# Patient Record
Sex: Female | Born: 1935 | Race: Asian | Hispanic: No | Smoking: Current every day smoker
Health system: Southern US, Community
[De-identification: ages and names within clinical notes are randomized; demographics above are authoritative.]

## PROBLEM LIST (undated history)

## (undated) DIAGNOSIS — I10 Essential (primary) hypertension: Secondary | ICD-10-CM

## (undated) DIAGNOSIS — E119 Type 2 diabetes mellitus without complications: Secondary | ICD-10-CM

## (undated) DIAGNOSIS — E785 Hyperlipidemia, unspecified: Secondary | ICD-10-CM

## (undated) DIAGNOSIS — Z9189 Other specified personal risk factors, not elsewhere classified: Secondary | ICD-10-CM

## (undated) DIAGNOSIS — I779 Disorder of arteries and arterioles, unspecified: Secondary | ICD-10-CM

## (undated) DIAGNOSIS — I7300 Raynaud's syndrome without gangrene: Secondary | ICD-10-CM

## (undated) DIAGNOSIS — Z8541 Personal history of malignant neoplasm of cervix uteri: Secondary | ICD-10-CM

## (undated) DIAGNOSIS — I340 Nonrheumatic mitral (valve) insufficiency: Secondary | ICD-10-CM

## (undated) DIAGNOSIS — G500 Trigeminal neuralgia: Secondary | ICD-10-CM

## (undated) DIAGNOSIS — Z9114 Patient's other noncompliance with medication regimen: Secondary | ICD-10-CM

## (undated) DIAGNOSIS — Z91148 Patient's other noncompliance with medication regimen for other reason: Secondary | ICD-10-CM

## (undated) DIAGNOSIS — I2510 Atherosclerotic heart disease of native coronary artery without angina pectoris: Secondary | ICD-10-CM

## (undated) HISTORY — DX: Hyperlipidemia, unspecified: E78.5

## (undated) HISTORY — DX: Nonrheumatic mitral (valve) insufficiency: I34.0

## (undated) HISTORY — DX: Trigeminal neuralgia: G50.0

## (undated) HISTORY — DX: Patient's other noncompliance with medication regimen for other reason: Z91.148

## (undated) HISTORY — DX: Other specified personal risk factors, not elsewhere classified: Z91.89

## (undated) HISTORY — DX: Personal history of malignant neoplasm of cervix uteri: Z85.41

## (undated) HISTORY — DX: Essential (primary) hypertension: I10

## (undated) HISTORY — DX: Atherosclerotic heart disease of native coronary artery without angina pectoris: I25.10

## (undated) HISTORY — DX: Type 2 diabetes mellitus without complications: E11.9

## (undated) HISTORY — DX: Patient's other noncompliance with medication regimen: Z91.14

## (undated) HISTORY — DX: Disorder of arteries and arterioles, unspecified: I77.9

## (undated) HISTORY — PX: BREAST BIOPSY: SHX20

## (undated) HISTORY — DX: Raynaud's syndrome without gangrene: I73.00

## (undated) HISTORY — PX: BREAST ENHANCEMENT SURGERY: SHX7

---

## 1961-11-25 HISTORY — PX: TOTAL ABDOMINAL HYSTERECTOMY: SHX209

## 1999-11-01 ENCOUNTER — Encounter: Payer: Self-pay | Admitting: Emergency Medicine

## 1999-11-01 ENCOUNTER — Inpatient Hospital Stay (HOSPITAL_COMMUNITY): Admission: EM | Admit: 1999-11-01 | Discharge: 1999-11-06 | Payer: Self-pay

## 1999-11-01 ENCOUNTER — Encounter: Payer: Self-pay | Admitting: General Surgery

## 1999-11-02 ENCOUNTER — Encounter: Payer: Self-pay | Admitting: Neurosurgery

## 1999-11-03 ENCOUNTER — Encounter: Payer: Self-pay | Admitting: General Surgery

## 1999-11-04 ENCOUNTER — Encounter: Payer: Self-pay | Admitting: Internal Medicine

## 1999-11-08 ENCOUNTER — Encounter: Admission: RE | Admit: 1999-11-08 | Discharge: 1999-11-08 | Payer: Self-pay | Admitting: *Deleted

## 2000-05-27 ENCOUNTER — Encounter: Admission: RE | Admit: 2000-05-27 | Discharge: 2000-05-27 | Payer: Self-pay | Admitting: General Surgery

## 2000-05-27 ENCOUNTER — Encounter: Payer: Self-pay | Admitting: General Surgery

## 2000-05-29 ENCOUNTER — Encounter (INDEPENDENT_AMBULATORY_CARE_PROVIDER_SITE_OTHER): Payer: Self-pay | Admitting: *Deleted

## 2000-05-29 ENCOUNTER — Ambulatory Visit (HOSPITAL_BASED_OUTPATIENT_CLINIC_OR_DEPARTMENT_OTHER): Admission: RE | Admit: 2000-05-29 | Discharge: 2000-05-29 | Payer: Self-pay | Admitting: General Surgery

## 2003-11-01 ENCOUNTER — Other Ambulatory Visit: Admission: RE | Admit: 2003-11-01 | Discharge: 2003-11-01 | Payer: Self-pay | Admitting: Obstetrics and Gynecology

## 2005-05-10 ENCOUNTER — Ambulatory Visit: Payer: Self-pay | Admitting: Internal Medicine

## 2005-08-28 ENCOUNTER — Ambulatory Visit: Payer: Self-pay | Admitting: Internal Medicine

## 2006-01-01 ENCOUNTER — Ambulatory Visit: Payer: Self-pay | Admitting: Internal Medicine

## 2006-05-01 ENCOUNTER — Encounter: Payer: Self-pay | Admitting: Internal Medicine

## 2006-05-02 ENCOUNTER — Ambulatory Visit: Payer: Self-pay | Admitting: Internal Medicine

## 2006-05-02 ENCOUNTER — Other Ambulatory Visit: Admission: RE | Admit: 2006-05-02 | Discharge: 2006-05-02 | Payer: Self-pay | Admitting: Internal Medicine

## 2006-05-02 ENCOUNTER — Encounter (INDEPENDENT_AMBULATORY_CARE_PROVIDER_SITE_OTHER): Payer: Self-pay | Admitting: *Deleted

## 2006-07-31 ENCOUNTER — Ambulatory Visit: Payer: Self-pay | Admitting: Internal Medicine

## 2006-10-30 ENCOUNTER — Ambulatory Visit: Payer: Self-pay | Admitting: Internal Medicine

## 2006-10-30 LAB — CONVERTED CEMR LAB: Hgb A1c MFr Bld: 8 % — ABNORMAL HIGH (ref 4.6–6.0)

## 2007-02-02 ENCOUNTER — Ambulatory Visit: Payer: Self-pay | Admitting: Internal Medicine

## 2007-02-02 LAB — CONVERTED CEMR LAB: Hgb A1c MFr Bld: 7.8 % — ABNORMAL HIGH (ref 4.6–6.0)

## 2007-05-05 ENCOUNTER — Encounter: Payer: Self-pay | Admitting: Internal Medicine

## 2007-05-05 ENCOUNTER — Ambulatory Visit: Payer: Self-pay | Admitting: Internal Medicine

## 2007-05-05 LAB — CONVERTED CEMR LAB: Hgb A1c MFr Bld: 7.6 % — ABNORMAL HIGH (ref 4.6–6.0)

## 2007-07-09 ENCOUNTER — Encounter (INDEPENDENT_AMBULATORY_CARE_PROVIDER_SITE_OTHER): Payer: Self-pay

## 2007-07-23 DIAGNOSIS — E785 Hyperlipidemia, unspecified: Secondary | ICD-10-CM

## 2007-07-23 DIAGNOSIS — Z8541 Personal history of malignant neoplasm of cervix uteri: Secondary | ICD-10-CM

## 2007-07-23 DIAGNOSIS — I10 Essential (primary) hypertension: Secondary | ICD-10-CM | POA: Insufficient documentation

## 2007-07-23 DIAGNOSIS — E119 Type 2 diabetes mellitus without complications: Secondary | ICD-10-CM | POA: Insufficient documentation

## 2007-08-20 ENCOUNTER — Ambulatory Visit: Payer: Self-pay | Admitting: Internal Medicine

## 2007-08-20 LAB — CONVERTED CEMR LAB
ALT: 19 units/L (ref 0–35)
AST: 20 units/L (ref 0–37)
Albumin: 3.7 g/dL (ref 3.5–5.2)
Alkaline Phosphatase: 59 units/L (ref 39–117)
BUN: 9 mg/dL (ref 6–23)
Basophils Absolute: 0.1 10*3/uL (ref 0.0–0.1)
Basophils Relative: 0.9 % (ref 0.0–1.0)
Bilirubin, Direct: 0.2 mg/dL (ref 0.0–0.3)
CO2: 28 meq/L (ref 19–32)
Calcium: 9.4 mg/dL (ref 8.4–10.5)
Chloride: 108 meq/L (ref 96–112)
Cholesterol: 151 mg/dL (ref 0–200)
Creatinine, Ser: 0.7 mg/dL (ref 0.4–1.2)
Direct LDL: 80 mg/dL
Eosinophils Absolute: 0.1 10*3/uL (ref 0.0–0.6)
Eosinophils Relative: 0.7 % (ref 0.0–5.0)
GFR calc Af Amer: 106 mL/min
GFR calc non Af Amer: 88 mL/min
Glucose, Bld: 62 mg/dL — ABNORMAL LOW (ref 70–99)
HCT: 44.4 % (ref 36.0–46.0)
HDL: 39.6 mg/dL (ref >39.0)
Hemoglobin: 15.3 g/dL — ABNORMAL HIGH (ref 12.0–15.0)
Hgb A1c MFr Bld: 7 % — ABNORMAL HIGH (ref 4.6–6.0)
Lymphocytes Relative: 26.9 % (ref 12.0–46.0)
MCHC: 34.4 g/dL (ref 30.0–36.0)
MCV: 93.7 fL (ref 78.0–100.0)
Monocytes Absolute: 0.6 10*3/uL (ref 0.2–0.7)
Monocytes Relative: 5 % (ref 3.0–11.0)
Neutro Abs: 7.2 10*3/uL (ref 1.4–7.7)
Neutrophils Relative %: 66.5 % (ref 43.0–77.0)
Platelets: 273 10*3/uL (ref 150–400)
Potassium: 4 meq/L (ref 3.5–5.1)
RBC: 4.74 M/uL (ref 3.87–5.11)
RDW: 12.9 % (ref 11.5–14.6)
Sodium: 144 meq/L (ref 135–145)
TSH: 1.38 microintl units/mL (ref 0.35–5.50)
Total Bilirubin: 0.9 mg/dL (ref 0.3–1.2)
Total CHOL/HDL Ratio: 3.8
Total Protein: 6 g/dL (ref 6.0–8.3)
Triglycerides: 224 mg/dL (ref 0–149)
VLDL: 45 mg/dL — ABNORMAL HIGH (ref 0–40)
WBC: 11 10*3/uL — ABNORMAL HIGH (ref 4.5–10.5)

## 2007-08-24 ENCOUNTER — Encounter: Payer: Self-pay | Admitting: Internal Medicine

## 2007-09-07 ENCOUNTER — Ambulatory Visit: Payer: Self-pay | Admitting: Gastroenterology

## 2007-09-18 ENCOUNTER — Ambulatory Visit: Payer: Self-pay | Admitting: Gastroenterology

## 2007-09-18 ENCOUNTER — Encounter: Payer: Self-pay | Admitting: Internal Medicine

## 2008-03-08 ENCOUNTER — Ambulatory Visit: Payer: Self-pay | Admitting: Internal Medicine

## 2008-04-13 ENCOUNTER — Encounter: Payer: Self-pay | Admitting: Internal Medicine

## 2008-04-14 ENCOUNTER — Telehealth: Payer: Self-pay | Admitting: Internal Medicine

## 2008-04-14 ENCOUNTER — Ambulatory Visit: Payer: Self-pay | Admitting: Internal Medicine

## 2008-04-14 DIAGNOSIS — G500 Trigeminal neuralgia: Secondary | ICD-10-CM | POA: Insufficient documentation

## 2008-04-14 LAB — CONVERTED CEMR LAB
ALT: 17 units/L (ref 0–35)
AST: 19 units/L (ref 0–37)
Albumin: 3.4 g/dL — ABNORMAL LOW (ref 3.5–5.2)
Alkaline Phosphatase: 65 units/L (ref 39–117)
Basophils Absolute: 0.1 10*3/uL (ref 0.0–0.1)
Basophils Relative: 1.2 % — ABNORMAL HIGH (ref 0.0–1.0)
Bilirubin, Direct: 0.1 mg/dL (ref 0.0–0.3)
Eosinophils Absolute: 0.1 10*3/uL (ref 0.0–0.7)
Eosinophils Relative: 1.3 % (ref 0.0–5.0)
HCT: 43.7 % (ref 36.0–46.0)
Hemoglobin: 14.7 g/dL (ref 12.0–15.0)
Lymphocytes Relative: 28.2 % (ref 12.0–46.0)
MCHC: 33.6 g/dL (ref 30.0–36.0)
MCV: 93.3 fL (ref 78.0–100.0)
Monocytes Absolute: 0.4 10*3/uL (ref 0.1–1.0)
Monocytes Relative: 4.6 % (ref 3.0–12.0)
Neutro Abs: 5.6 10*3/uL (ref 1.4–7.7)
Neutrophils Relative %: 64.7 % (ref 43.0–77.0)
Platelets: 272 10*3/uL (ref 150–400)
RBC: 4.68 M/uL (ref 3.87–5.11)
RDW: 12.8 % (ref 11.5–14.6)
Total Bilirubin: 0.7 mg/dL (ref 0.3–1.2)
Total Protein: 5.7 g/dL — ABNORMAL LOW (ref 6.0–8.3)
WBC: 8.7 10*3/uL (ref 4.5–10.5)

## 2008-10-19 ENCOUNTER — Ambulatory Visit: Payer: Self-pay | Admitting: Internal Medicine

## 2009-02-15 ENCOUNTER — Ambulatory Visit: Payer: Self-pay | Admitting: Internal Medicine

## 2009-02-15 DIAGNOSIS — J069 Acute upper respiratory infection, unspecified: Secondary | ICD-10-CM | POA: Insufficient documentation

## 2009-02-15 LAB — CONVERTED CEMR LAB: Hgb A1c MFr Bld: 7 % — ABNORMAL HIGH (ref 4.6–6.5)

## 2009-03-08 ENCOUNTER — Telehealth: Payer: Self-pay | Admitting: Internal Medicine

## 2009-04-06 ENCOUNTER — Encounter: Payer: Self-pay | Admitting: Internal Medicine

## 2009-06-14 ENCOUNTER — Ambulatory Visit: Payer: Self-pay | Admitting: Internal Medicine

## 2009-06-15 LAB — CONVERTED CEMR LAB: Hgb A1c MFr Bld: 7.6 % — ABNORMAL HIGH (ref 4.6–6.5)

## 2009-10-11 ENCOUNTER — Ambulatory Visit: Payer: Self-pay | Admitting: Internal Medicine

## 2009-10-12 LAB — CONVERTED CEMR LAB: Hgb A1c MFr Bld: 7.6 % — ABNORMAL HIGH (ref 4.6–6.5)

## 2009-11-30 ENCOUNTER — Ambulatory Visit: Payer: Self-pay | Admitting: Internal Medicine

## 2009-11-30 DIAGNOSIS — I7300 Raynaud's syndrome without gangrene: Secondary | ICD-10-CM | POA: Insufficient documentation

## 2009-11-30 LAB — CONVERTED CEMR LAB: Hgb A1c MFr Bld: 6.9 % — ABNORMAL HIGH (ref 4.6–6.5)

## 2010-01-12 ENCOUNTER — Ambulatory Visit: Payer: Self-pay | Admitting: Internal Medicine

## 2010-01-12 LAB — CONVERTED CEMR LAB: Blood Glucose, Fingerstick: 96

## 2010-04-05 ENCOUNTER — Telehealth: Payer: Self-pay | Admitting: Internal Medicine

## 2010-04-09 ENCOUNTER — Ambulatory Visit: Payer: Self-pay | Admitting: Internal Medicine

## 2010-07-16 ENCOUNTER — Ambulatory Visit: Payer: Self-pay | Admitting: Internal Medicine

## 2010-07-16 DIAGNOSIS — B079 Viral wart, unspecified: Secondary | ICD-10-CM | POA: Insufficient documentation

## 2010-07-16 LAB — HM DIABETES FOOT EXAM

## 2010-07-16 LAB — HM DIABETES EYE EXAM: HM Diabetic Eye Exam: NORMAL

## 2010-10-08 ENCOUNTER — Ambulatory Visit: Payer: Self-pay | Admitting: Internal Medicine

## 2010-12-27 NOTE — Assessment & Plan Note (Signed)
Summary: 3 MTH ROV // RS   Vital Signs:  Patient profile:   77 year old female Weight:      119 pounds Temp:     98.1 degrees F oral BP sitting:   100 / 60  (left arm) Cuff size:   regular  Vitals Entered By: Duard Brady LPN (Apr 09, 2010 12:47 PM) CC: 3 mos rov - doing ok       bs 147 Is Patient Diabetic? Yes Did you bring your meter with you today? No   CC:  3 mos rov - doing ok       bs 147.  History of Present Illness: 77 year old patient seen today for follow-up of her type 2 diabetes, hypertension, and dyslipidemia.  She continues to do quite well.  Her last hemoglobin A1c6.9.  Blood sugars remain under nice control.  Her only complaint is a nonhealing rash involving her left thumb.  This been there for a number of months.  This has not responded to topical antibiotic ointment.  She remains on Lipitor, which she continues to tolerate well.  She denies any cardiopulmonary complaints  Allergies (verified): No Known Drug Allergies  Past History:  Past Medical History: Reviewed history from 11/30/2009 and no changes required. Diabetes mellitus, type II Hyperlipidemia Hypertension Cervical cancer, hx of trigeminal neuralgia Raynaud's phenomenon  Family History: Reviewed history from 08/20/2007 and no changes required. both parents died  in their 53s natural causes, details unclear 6 brothers 6 sisters denies any family history of diabetes, cancer or premature heart disease  Review of Systems       The patient complains of suspicious skin lesions.  The patient denies anorexia, fever, weight loss, weight gain, vision loss, decreased hearing, hoarseness, chest pain, syncope, dyspnea on exertion, peripheral edema, prolonged cough, headaches, hemoptysis, abdominal pain, melena, hematochezia, severe indigestion/heartburn, hematuria, incontinence, genital sores, muscle weakness, transient blindness, difficulty walking, depression, unusual weight change, abnormal bleeding,  enlarged lymph nodes, angioedema, and breast masses.    Physical Exam  General:  Well-developed,well-nourished,in no acute distress; alert,appropriate and cooperative throughout examination Eyes:  No corneal or conjunctival inflammation noted. EOMI. Perrla. Funduscopic exam benign, without hemorrhages, exudates or papilledema. Vision grossly normal. Mouth:  Oral mucosa and oropharynx without lesions or exudates.  Teeth in good repair. Neck:  No deformities, masses, or tenderness noted. Lungs:  a few crackles, right base Heart:  Normal rate and regular rhythm. S1 and S2 normal without gallop, murmur, click, rub or other extra sounds. Abdomen:  Bowel sounds positive,abdomen soft and non-tender without masses, organomegaly or hernias noted. Msk:  No deformity or scoliosis noted of thoracic or lumbar spine.   Pulses:  R and L carotid,radial,femoral,dorsalis pedis and posterior tibial pulses are full and equal bilaterally Extremities:  No clubbing, cyanosis, edema, or deformity noted with normal full range of motion of all joints.   Skin:  a small 3 to 4-mm chronic appearing shallow ulcer noted involving the dorsal aspect of her left medial thumb Cervical Nodes:  No lymphadenopathy noted   Impression & Recommendations:  Problem # 1:  HYPERTENSION (ICD-401.9)  Her updated medication list for this problem includes:    Lisinopril-hydrochlorothiazide 20-12.5 Mg Tabs (Lisinopril-hydrochlorothiazide) .[name]... 2 every am  Her updated medication list for this problem includes:    Lisinopril-hydrochlorothiazide 20-12.5 Mg Tabs (Lisinopril-hydrochlorothiazide) .[name]... 2 every am  Problem # 2:  HYPERLIPIDEMIA (ICD-272.4)  Her updated medication list for this problem includes:    Lipitor 20 Mg Tabs (Atorvastatin calcium) .Marland KitchenMarland KitchenMarland KitchenMarland Kitchen  1 once daily  Her updated medication list for this problem includes:    Lipitor 20 Mg Tabs (Atorvastatin calcium) .[name]... 1 once daily  Problem # 3:  DIABETES MELLITUS, TYPE II  (ICD-250.00)  Her updated medication list for this problem includes:    Lisinopril-hydrochlorothiazide 20-12.5 Mg Tabs (Lisinopril-hydrochlorothiazide) .[name]... 2 every am    Metformin Hcl 1000 Mg Tabs (Metformin hcl) ..... One tablet at bedtime    Glipizide Xl 5 Mg Xr24h-tab (Glipizide) ..... One daily    Her updated medication list for this problem includes:    Lisinopril-hydrochlorothiazide 20-12.5 Mg Tabs (Lisinopril-hydrochlorothiazide) .[name]... 2 every am    Metformin Hcl 1000 Mg Tabs (Metformin hcl) ..... One tablet at bedtime    Glipizide Xl 5 Mg Xr24h-tab (Glipizide) ..... One daily  Orders: Venipuncture ([address]) TLB-A1C / Hgb A1C (Glycohemoglobin) (83036-A1C)  Complete Medication List: 1)  Onetouch Ultra Test Strp (Glucose blood) .... Use as directed 2)  Lipitor 20 Mg Tabs (Atorvastatin calcium) .[name].. 1 once daily 3)  Lisinopril-hydrochlorothiazide 20-12.5 Mg Tabs (Lisinopril-hydrochlorothiazide) .... 2 every am 4)  Hydrocodone-homatropine 5-1.5 Mg/48ml Syrp (Hydrocodone-homatropine) .[name].. 1 teaspoon every 6 hours as needed for cough 5)  Metformin Hcl 1000 Mg Tabs (Metformin hcl) .... One tablet at bedtime 6)  Bd Microtainer Lancets Misc (Lancets) .... Use as directed 7)  Glipizide Xl 5 Mg Xr24h-tab (Glipizide) .... One daily 8)  Ra Triple Antibiotic Plus 1 % Oint (Neomy-bacit-polymyx-pramoxine) .... Apply twice daily  Patient Instructions: 1)  Please schedule a follow-up appointment in 3 months. 2)  Limit your Sodium (Salt) to less than 2 grams a day(slightly less than 1/2 a teaspoon) to prevent fluid retention, swelling, or worsening of symptoms. 3)  It is important that you exercise regularly at least 20 minutes 5 times a week. If you develop chest pain, have severe difficulty breathing, or feel very tired , stop exercising immediately and seek medical attention. 4)  Check your blood sugars regularly. If your readings are usually above : or below 70 you should contact our  office. 5)  It is important that your Diabetic A1c level is checked every 3 months. 6)  See your eye doctor yearly to check for diabetic eye damage. Prescriptions: RA TRIPLE ANTIBIOTIC PLUS 1 % OINT (NEOMY-BACIT-POLYMYX-PRAMOXINE) apply twice daily  #30 gms x 0   Entered and Authorized by:   Gordy Savers  MD   Signed by:   Gordy Savers  MD on 04/09/2010   Method used:   Print then Give to Patient   RxID:   [id number]

## 2010-12-27 NOTE — Assessment & Plan Note (Signed)
Summary: cpx/ pt will be fasting/mm   Vital Signs:  Patient profile:   77 year old female Height:      57 inches Weight:      120 pounds BMI:     26.06 Temp:     98.8 degrees F oral BP sitting:   106 / 70  (left arm) Cuff size:   regular  Vitals Entered By: Duard Brady LPN (January 12, 2010 9:41 AM) CC: was for CPX - pt does not want , 3mos ROV , no problems ,   Is Patient Diabetic? Yes CBG Result 96   CC:  was for CPX - pt does not want , 3mos ROV , no problems , and .  History of Present Illness: 77 year old patient who is seen today for follow-up of her diabetes and hypertension.  she was scheduled for a physical, which she declines.  She states as she has episodes during the night, where she becomes quite weak, shaky, and diaphoretic.  For the past few nights.  She has discontinued her metformin and has had no recurrences.  She was seen here last month and her hemoglobin A1c was 6.9.  She has been going to the health club regularly.  Last month, her metformin had been on hold for two months.  Preventive Screening-Counseling & Management  Alcohol-Tobacco     Smoking Status: current  Allergies (verified): No Known Drug Allergies  Past History:  Past Medical History: Reviewed history from 11/30/2009 and no changes required. Diabetes mellitus, type II Hyperlipidemia Hypertension Cervical cancer, hx of trigeminal neuralgia Raynaud's phenomenon  Review of Systems  The patient denies anorexia, fever, weight loss, weight gain, vision loss, decreased hearing, hoarseness, chest pain, syncope, dyspnea on exertion, peripheral edema, prolonged cough, headaches, hemoptysis, abdominal pain, melena, hematochezia, severe indigestion/heartburn, hematuria, incontinence, genital sores, muscle weakness, suspicious skin lesions, transient blindness, difficulty walking, depression, unusual weight change, abnormal bleeding, enlarged lymph nodes, angioedema, and breast masses.     Physical Exam  General:  Well-developed,well-nourished,in no acute distress; alert,appropriate and cooperative throughout examination Mouth:  Oral mucosa and oropharynx without lesions or exudates.  Teeth in good repair. Neck:  No deformities, masses, or tenderness noted. Lungs:  Normal respiratory effort, chest expands symmetrically. Lungs are clear to auscultation, no crackles or wheezes. Heart:  Normal rate and regular rhythm. S1 and S2 normal without gallop, murmur, click, rub or other extra sounds. Abdomen:  Bowel sounds positive,abdomen soft and non-tender without masses, organomegaly or hernias noted. Msk:  No deformity or scoliosis noted of thoracic or lumbar spine.     Impression & Recommendations:  Problem # 1:  HYPERTENSION (ICD-401.9)  Her updated medication list for this problem includes:    Lisinopril-hydrochlorothiazide 20-12.5 Mg Tabs (Lisinopril-hydrochlorothiazide) .[name]... 2 every am  Her updated medication list for this problem includes:    Lisinopril-hydrochlorothiazide 20-12.5 Mg Tabs (Lisinopril-hydrochlorothiazide) .[name]... 2 every am  Problem # 2:  DIABETES MELLITUS, TYPE II (ICD-250.00)  The following medications were removed from the medication list:    Glipizide 10 Mg Tb24 (Glipizide) .[name]... 1 every morning    Actos 15 Mg Tabs (Pioglitazone hcl) .[name]... 1 once daily Her updated medication list for this problem includes:    Lisinopril-hydrochlorothiazide 20-12.5 Mg Tabs (Lisinopril-hydrochlorothiazide) .[name]... 2 every am    Metformin Hcl 1000 Mg Tabs (Metformin hcl) ..... One tablet at bedtime    Glipizide Xl 5 Mg Xr24h-tab (Glipizide) ..... One daily  Orders: Capillary Blood Glucose/CBG [phone])  The following medications were removed from the  medication list:    Glipizide 10 Mg Tb24 (Glipizide) .[name]... 1 every morning    Actos 15 Mg Tabs (Pioglitazone hcl) .[name]... 1 once daily Her updated medication list for this problem includes:     Lisinopril-hydrochlorothiazide 20-12.5 Mg Tabs (Lisinopril-hydrochlorothiazide) .[name]... 2 every am    Metformin Hcl 1000 Mg Tabs (Metformin hcl) ..... One tablet at bedtime    Glipizide Xl 5 Mg Xr24h-tab (Glipizide) ..... One daily  Complete Medication List: 1)  Onetouch Ultra Test Strp (Glucose blood) .... Use as directed 2)  Lipitor 20 Mg Tabs (Atorvastatin calcium) .[name].. 1 once daily 3)  Lisinopril-hydrochlorothiazide 20-12.5 Mg Tabs (Lisinopril-hydrochlorothiazide) .... 2 every am 4)  Hydrocodone-homatropine 5-1.5 Mg/58ml Syrp (Hydrocodone-homatropine) .[name].. 1 teaspoon every 6 hours as needed for cough 5)  Metformin Hcl 1000 Mg Tabs (Metformin hcl) .... One tablet at bedtime 6)  Bd Microtainer Lancets Misc (Lancets) .... Use as directed 7)  Glipizide Xl 5 Mg Xr24h-tab (Glipizide) .... One daily  Other Orders: Prescription Created Electronically [id number])  Patient Instructions: 1)  Please schedule a follow-up appointment in 3 months. 2)  Limit your Sodium (Salt). 3)  It is important that you exercise regularly at least 20 minutes 5 times a week. If you develop chest pain, have severe difficulty breathing, or feel very tired , stop exercising immediately and seek medical attention. 4)  Take calcium +Vitamin D daily. 5)  Check your blood sugars regularly. If your readings are usually above : or below 70 you should contact our office. 6)  It is important that your Diabetic A1c level is checked every 3 months. 7)  See your eye doctor yearly to check for diabetic eye damage. Prescriptions: GLIPIZIDE XL 5 MG XR24H-TAB (GLIPIZIDE) one daily  #90 x 6   Entered and Authorized by:   Gordy Savers  MD   Signed by:   Gordy Savers  MD on 01/12/2010   Method used:   Print then Give to Patient   RxID:   [id number] BD MICROTAINER LANCETS  MISC (LANCETS) use as directed  #90 x 6   Entered and Authorized by:   Gordy Savers  MD   Signed by:   Gordy Savers  MD on 01/12/2010    Method used:   Print then Give to Patient   RxID:   [id number] METFORMIN HCL 1000 MG TABS (METFORMIN HCL) one tablet at bedtime  #90 x 6   Entered and Authorized by:   Gordy Savers  MD   Signed by:   Gordy Savers  MD on 01/12/2010   Method used:   Print then Give to Patient   RxID:   [id number] LISINOPRIL-HYDROCHLOROTHIAZIDE 20-12.5 MG  TABS (LISINOPRIL-HYDROCHLOROTHIAZIDE) 2 every am  #100.0 Each x 2   Entered and Authorized by:   Gordy Savers  MD   Signed by:   Gordy Savers  MD on 01/12/2010   Method used:   Print then Give to Patient   RxID:   [id number] LIPITOR 20 MG  TABS (ATORVASTATIN CALCIUM) 1 once daily  #90.0 Each x 2   Entered and Authorized by:   Gordy Savers  MD   Signed by:   Gordy Savers  MD on 01/12/2010   Method used:   Print then Give to Patient   RxID:   [id number] ONETOUCH ULTRA TEST  STRP (GLUCOSE BLOOD) Use as directed  #100 x 6   Entered and Authorized by:   Gordy Savers  MD   Signed by:   Gordy Savers  MD on 01/12/2010   Method used:   Print then Give to Patient   RxID:   [id number] GLIPIZIDE XL 5 MG XR24H-TAB (GLIPIZIDE) one daily  #90 x 6   Entered and Authorized by:   Gordy Savers  MD   Signed by:   Gordy Savers  MD on 01/12/2010   Method used:   Electronically to        [facility] #[id number]* (retail)       [address] [place], [place]  [address]       Ph: [phone]       Fax: [phone]   RxID:   [id number] BD MICROTAINER LANCETS  MISC (LANCETS) use as directed  #90 x 6   Entered and Authorized by:   Gordy Savers  MD   Signed by:   Gordy Savers  MD on 01/12/2010   Method used:   Electronically to        [facility] #[id number]* (retail)       [address] [place], [place]  [address]       Ph: [phone]       Fax: [phone]   RxID:   [id number] METFORMIN HCL 1000 MG TABS (METFORMIN HCL)  one tablet at bedtime  #90 x 6   Entered and Authorized by:   Gordy Savers  MD   Signed by:   Gordy Savers  MD on 01/12/2010   Method used:   Electronically to        [facility] #[id number]* (retail)       [address] [place], [place]  [address]       Ph: [phone]       Fax: [phone]   RxID:   [id number] LISINOPRIL-HYDROCHLOROTHIAZIDE 20-12.5 MG  TABS (LISINOPRIL-HYDROCHLOROTHIAZIDE) 2 every am  #100.0 Each x 2   Entered and Authorized by:   Gordy Savers  MD   Signed by:   Gordy Savers  MD on 01/12/2010   Method used:   Electronically to        [facility] #[id number]* (retail)       [address] [place], [place]  [address]       Ph: [phone]       Fax: [phone]   RxID:   [id number] LIPITOR 20 MG  TABS (ATORVASTATIN CALCIUM) 1 once daily  #90.0 Each x 2   Entered and Authorized by:   Gordy Savers  MD   Signed by:   Gordy Savers  MD on 01/12/2010   Method used:   Electronically to        [facility] #[id number]* (retail)       [address] [place], [place]  [address]       Ph: [phone]       Fax: [phone]   RxID:   [id number] [place] TEST  STRP (GLUCOSE BLOOD) Use as directed  #100 x 6   Entered and Authorized by:   Gordy Savers  MD   Signed by:   Gordy Savers  MD on 01/12/2010   Method used:   Electronically to  [facility] #[id number]* (retail)       [address] [place], [place]  [address]       Ph: [phone]       Fax: [phone]   RxID:   [id number]

## 2010-12-27 NOTE — Assessment & Plan Note (Signed)
Summary: stays hungry, and hands look discolored/dm   Vital Signs:  Patient profile:   77 year old female Weight:      116 pounds BMI:     25.19 Temp:     98.4 degrees F oral BP sitting:   130 / 64  (left arm) Cuff size:   regular  Vitals Entered By: Raechel Ache, RN (November 30, 2009 3:22 PM) CC: C/o fingers turning black and numb in cold and feels too hungry Is Patient Diabetic? Yes   CC:  C/o fingers turning black and numb in cold and feels too hungry.  History of Present Illness: 77 year old patient who is seen today for follow-up of her hypertension and diabetes.  She has a long history of discoloration of primarily her index and third fingers on exposure to cold.  For the past two weeks.  She states that both her hands upon cold exposure become dark and cold.  There is no significant pain, but the hands do become numb. Other complaints include inability to lose weight and increase in her appetite and food consumption.  She does go to the jam frequently.  She has been on metformin in the past, but she states she ran out two months ago.  Her last hemoglobin A1c7.6.  Allergies: No Known Drug Allergies  Past History:  Past Medical History: Diabetes mellitus, type II Hyperlipidemia Hypertension Cervical cancer, hx of trigeminal neuralgia Raynaud's phenomenon  Past Surgical History: Reviewed history from 08/20/2007 and no changes required. Breast biopsy Hysterectomy breast implants hysterectomy 1963, for cervical cancer in situ  Review of Systems       The patient complains of weight gain.  The patient denies anorexia, fever, weight loss, vision loss, decreased hearing, hoarseness, chest pain, syncope, dyspnea on exertion, peripheral edema, prolonged cough, headaches, hemoptysis, abdominal pain, melena, hematochezia, severe indigestion/heartburn, hematuria, incontinence, genital sores, muscle weakness, suspicious skin lesions, transient blindness, difficulty walking,  depression, unusual weight change, abnormal bleeding, enlarged lymph nodes, angioedema, breast masses, and testicular masses.    Physical Exam  General:  overweight-appearing.  blood pressure, normaloverweight-appearing.   Head:  Normocephalic and atraumatic without obvious abnormalities. No apparent alopecia or balding. Eyes:  No corneal or conjunctival inflammation noted. EOMI. Perrla. Funduscopic exam benign, without hemorrhages, exudates or papilledema. Vision grossly normal. Mouth:  Oral mucosa and oropharynx without lesions or exudates.  Teeth in good repair. Neck:  No deformities, masses, or tenderness noted. Lungs:  Normal respiratory effort, chest expands symmetrically. Lungs are clear to auscultation, no crackles or wheezes. Heart:  Normal rate and regular rhythm. S1 and S2 normal without gallop, murmur, click, rub or other extra sounds. Abdomen:  Bowel sounds positive,abdomen soft and non-tender without masses, organomegaly or hernias noted. Pulses:  the right dorsalis pedis pulse diminished Extremities:  no obvious ischemia of the fingers.  Radial and ulnar pulses normal Skin:  Intact without suspicious lesions or rashes Cervical Nodes:  No lymphadenopathy noted   Impression & Recommendations:  Problem # 1:  HYPERTENSION (ICD-401.9)  Her updated medication list for this problem includes:    Lisinopril-hydrochlorothiazide 20-12.5 Mg Tabs (Lisinopril-hydrochlorothiazide) .[name]... 2 every am  Her updated medication list for this problem includes:    Lisinopril-hydrochlorothiazide 20-12.5 Mg Tabs (Lisinopril-hydrochlorothiazide) .[name]... 2 every am  Problem # 2:  HYPERLIPIDEMIA (ICD-272.4)  Her updated medication list for this problem includes:    Lipitor 20 Mg Tabs (Atorvastatin calcium) .[name]... 1 once daily  Her updated medication list for this problem includes:  Lipitor 20 Mg Tabs (Atorvastatin calcium) .[name]... 1 once daily  Problem # 3:  DIABETES MELLITUS, TYPE II  (ICD-250.00)  Her updated medication list for this problem includes:    Glipizide 10 Mg Tb24 (Glipizide) .[name]... 1 every morning    Actos 15 Mg Tabs (Pioglitazone hcl) .[name]... 1 once daily    Lisinopril-hydrochlorothiazide 20-12.5 Mg Tabs (Lisinopril-hydrochlorothiazide) .[name]... 2 every am    Metformin Hcl 1000 Mg Tabs (Metformin hcl) ..... One tablet at bedtime    Her updated medication list for this problem includes:    Glipizide 10 Mg Tb24 (Glipizide) .[name]... 1 every morning    Actos 15 Mg Tabs (Pioglitazone hcl) .[name]... 1 once daily    Lisinopril-hydrochlorothiazide 20-12.5 Mg Tabs (Lisinopril-hydrochlorothiazide) .[name]... 2 every am    Metformin Hcl 1000 Mg Tabs (Metformin hcl) ..... One tablet at bedtime  Problem # 4:  RAYNAUD'S SYNDROME (ICD-443.0)  symptoms seem very mild self-limiting and not associated with pain or overt ischemia.  Will simply treat with the cold avoidance and the use of gloves when exposed to cold.  If symptoms worsen or fail to improve, will consider a dihydropyridine calcium channel blocker  Complete Medication List: 1)  Glipizide 10 Mg Tb24 (Glipizide) .[name].. 1 every morning 2)  Onetouch Ultra Test Strp (Glucose blood) .... Use as directed 3)  Lipitor 20 Mg Tabs (Atorvastatin calcium) .[name].. 1 once daily 4)  Actos 15 Mg Tabs (Pioglitazone hcl) .[name].. 1 once daily 5)  Lisinopril-hydrochlorothiazide 20-12.5 Mg Tabs (Lisinopril-hydrochlorothiazide) .... 2 every am 6)  Hydrocodone-homatropine 5-1.5 Mg/27ml Syrp (Hydrocodone-homatropine) .[name].. 1 teaspoon every 6 hours as needed for cough 7)  Metformin Hcl 1000 Mg Tabs (Metformin hcl) .... One tablet at bedtime  Other Orders: Venipuncture (56213) TLB-A1C / Hgb A1C (Glycohemoglobin) (83036-A1C) Prescription Created Electronically [id number])  Patient Instructions: 1)  Please schedule a follow-up appointment in 3 months. 2)  Limit your Sodium (Salt). 3)  It is important that you exercise regularly at least 20 minutes 5 times a week.  If you develop chest pain, have severe difficulty breathing, or feel very tired , stop exercising immediately and seek medical attention. 4)  Check your blood sugars regularly. If your readings are usually above : or below 70 you should contact our office. 5)  It is important that your Diabetic A1c level is checked every 3 months. 6)  See your eye doctor yearly to check for diabetic eye damage. 7)  Wear gloves or mittens on exposure to cold Prescriptions: METFORMIN HCL 1000 MG TABS (METFORMIN HCL) one tablet at bedtime  #90 x 6   Entered and Authorized by:   Gordy Savers  MD   Signed by:   Gordy Savers  MD on 11/30/2009   Method used:   Print then Give to Patient   RxID:   [id number] METFORMIN HCL 1000 MG TABS (METFORMIN HCL) one tablet at bedtime  #90 x 6   Entered and Authorized by:   Gordy Savers  MD   Signed by:   Gordy Savers  MD on 11/30/2009   Method used:   Electronically to        [id number]* (retail)       [address] [place], [place]  [address]       Ph: [phone]       Fax: [phone]   RxID:   [id number]

## 2010-12-27 NOTE — Progress Notes (Signed)
Summary: no call no show 3 mos rov  no call no show 3 mos rov - attempt to call - ans mach - LMTCB and r/s   KIK

## 2010-12-27 NOTE — Assessment & Plan Note (Signed)
Summary: 3 month rov/njr   Vital Signs:  Patient profile:   77 year old female Weight:      117 pounds BP sitting:   140 / 78  (left arm) Cuff size:   regular  Vitals Entered By: Kathrynn Speed CMA (July 16, 2010 10:42 AM)  CC: 3 mth rov, src, Hypertension Management Is Patient Diabetic? Yes CBG Result 144   CC:  3 mth rov, src, and Hypertension Management.  History of Present Illness: 77 year old patient seen today for follow-up.  She has hypertension, diabetes, and dyslipidemia.  Her last hemoglobin A1c was 6.9.  Her weight has been stable and has decreased 2 pounds.  She feels well today.  She has tolerated her statin therapy without difficulty.  Hypertension History:      She denies headache, chest pain, palpitations, dyspnea with exertion, orthopnea, PND, peripheral edema, visual symptoms, neurologic problems, syncope, and side effects from treatment.        Positive major cardiovascular risk factors include female age 37 years old or older, diabetes, hyperlipidemia, hypertension, and current tobacco user.     Current Medications (verified): 1)  Onetouch Ultra Test  Strp (Glucose Blood) .... Use As Directed 2)  Lipitor 20 Mg  Tabs (Atorvastatin Calcium) .[name].. 1 Once Daily 3)  Lisinopril-Hydrochlorothiazide 20-12.5 Mg  Tabs (Lisinopril-Hydrochlorothiazide) .... 2 Every Am 4)  Hydrocodone-Homatropine 5-1.5 Mg/69ml Syrp (Hydrocodone-Homatropine) .[name].. 1 Teaspoon Every 6 Hours As Needed For Cough 5)  Metformin Hcl 1000 Mg Tabs (Metformin Hcl) .... One Tablet At Bedtime 6)  Bd Microtainer Lancets  Misc (Lancets) .... Use As Directed 7)  Glipizide Xl 5 Mg Xr24h-Tab (Glipizide) .... One Daily 8)  Ra Triple Antibiotic Plus 1 % Oint (Neomy-Bacit-Polymyx-Pramoxine) .... Apply Twice Daily  Allergies (verified): No Known Drug Allergies  Past History:  Past Medical History: Reviewed history from 11/30/2009 and no changes required. Diabetes mellitus, type  II Hyperlipidemia Hypertension Cervical cancer, hx of trigeminal neuralgia Raynaud's phenomenon  Family History: Reviewed history from 08/20/2007 and no changes required. both parents died  in their 42s natural causes, details unclear 6 brothers 6 sisters denies any family history of diabetes, cancer or premature heart disease  Review of Systems       The patient complains of suspicious skin lesions.  The patient denies anorexia, fever, weight loss, weight gain, vision loss, decreased hearing, hoarseness, chest pain, syncope, dyspnea on exertion, peripheral edema, prolonged cough, headaches, hemoptysis, abdominal pain, melena, hematochezia, severe indigestion/heartburn, hematuria, incontinence, genital sores, muscle weakness, transient blindness, difficulty walking, depression, unusual weight change, abnormal bleeding, enlarged lymph nodes, angioedema, and breast masses.    Physical Exam  General:  Well-developed,well-nourished,in no acute distress; alert,appropriate and cooperative throughout examination Head:  Normocephalic and atraumatic without obvious abnormalities. No apparent alopecia or balding. Eyes:  No corneal or conjunctival inflammation noted. EOMI. Perrla. Funduscopic exam benign, without hemorrhages, exudates or papilledema. Vision grossly normal. Mouth:  Oral mucosa and oropharynx without lesions or exudates.  Teeth in good repair. Neck:  No deformities, masses, or tenderness noted. Lungs:  Normal respiratory effort, chest expands symmetrically. Lungs are clear to auscultation, no crackles or wheezes. Heart:  Normal rate and regular rhythm. S1 and S2 normal without gallop, murmur, click, rub or other extra sounds. Abdomen:  Bowel sounds positive,abdomen soft and non-tender without masses, organomegaly or hernias noted. Msk:  No deformity or scoliosis noted of thoracic or lumbar spine.   Pulses:  R and L carotid,radial,femoral,dorsalis pedis and posterior tibial pulses are  full and  equal bilaterally Extremities:  No clubbing, cyanosis, edema, or deformity noted with normal full range of motion of all joints.   Skin:  a 3 to 4-mm  papular lesion noted involving the left thumb consistent with a verruca Cervical Nodes:  No lymphadenopathy noted  Diabetes Management Exam:    Foot Exam (with socks and/or shoes not present):       Sensory-Pinprick/Light touch:          Left medial foot (L-4): normal          Left dorsal foot (L-5): normal          Left lateral foot (S-1): normal          Right medial foot (L-4): normal          Right dorsal foot (L-5): normal          Right lateral foot (S-1): normal       Sensory-Monofilament:          Left foot: normal          Right foot: normal       Inspection:          Right foot: normal       Nails:          Left foot: normal          Right foot: normal    Foot Exam by Podiatrist:       Date: 07/16/2010       Results: no diabetic findings       Done by: PCP    Eye Exam:       Eye Exam done here today          Results: normal   Impression & Recommendations:  Problem # 1:  HYPERTENSION (ICD-401.9)  Her updated medication list for this problem includes:    Lisinopril-hydrochlorothiazide 20-12.5 Mg Tabs (Lisinopril-hydrochlorothiazide) .[name]... 2 every am  Her updated medication list for this problem includes:    Lisinopril-hydrochlorothiazide 20-12.5 Mg Tabs (Lisinopril-hydrochlorothiazide) .[name]... 2 every am  Problem # 2:  HYPERLIPIDEMIA (ICD-272.4)  Her updated medication list for this problem includes:    Lipitor 20 Mg Tabs (Atorvastatin calcium) .[name]... 1 once daily  Her updated medication list for this problem includes:    Lipitor 20 Mg Tabs (Atorvastatin calcium) .[name]... 1 once daily  Problem # 3:  DIABETES MELLITUS, TYPE II (ICD-250.00)  Her updated medication list for this problem includes:    Lisinopril-hydrochlorothiazide 20-12.5 Mg Tabs (Lisinopril-hydrochlorothiazide) .[name]... 2 every am    Metformin  Hcl 1000 Mg Tabs (Metformin hcl) ..... One tablet at bedtime    Glipizide Xl 5 Mg Xr24h-tab (Glipizide) ..... One daily  Orders: Capillary Blood Glucose/CBG (47829)  Her updated medication list for this problem includes:    Lisinopril-hydrochlorothiazide 20-12.5 Mg Tabs (Lisinopril-hydrochlorothiazide) .[name]... 2 every am    Metformin Hcl 1000 Mg Tabs (Metformin hcl) ..... One tablet at bedtime    Glipizide Xl 5 Mg Xr24h-tab (Glipizide) ..... One daily  Problem # 4:  WARTS, HAND (ICD-078.10)  Complete Medication List: 1)  Onetouch Ultra Test Strp (Glucose blood) .... Use as directed 2)  Lipitor 20 Mg Tabs (Atorvastatin calcium) .[name].. 1 once daily 3)  Lisinopril-hydrochlorothiazide 20-12.5 Mg Tabs (Lisinopril-hydrochlorothiazide) .... 2 every am 4)  Hydrocodone-homatropine 5-1.5 Mg/20ml Syrp (Hydrocodone-homatropine) .[name].. 1 teaspoon every 6 hours as needed for cough 5)  Metformin Hcl 1000 Mg Tabs (Metformin hcl) .... One tablet at bedtime 6)  Bd Microtainer Lancets Misc (Lancets) .... Use  as directed 7)  Glipizide Xl 5 Mg Xr24h-tab (Glipizide) .... One daily 8)  Ra Triple Antibiotic Plus 1 % Oint (Neomy-bacit-polymyx-pramoxine) .... Apply twice daily  Other Orders: Cryotherapy/Destruction benign or premalignant lesion (1st lesion)  (17000) Venipuncture (04540) TLB-A1C / Hgb A1C (Glycohemoglobin) (83036-A1C)  Hypertension Assessment/Plan:      The patient's hypertensive risk group is category C: Target organ damage and/or diabetes.  Today's blood pressure is 140/78.    Patient Instructions: 1)  Please schedule a follow-up appointment in 3 months. 2)  It is important that you exercise regularly at least 20 minutes 5 times a week. If you develop chest pain, have severe difficulty breathing, or feel very tired , stop exercising immediately and seek medical attention. 3)  Check your blood sugars regularly. If your readings are usually above : or below 70 you should contact our office. 4)  It  is important that your Diabetic A1c level is checked every 3 months. 5)  See your eye doctor yearly to check for diabetic eye damage. Prescriptions: GLIPIZIDE XL 5 MG XR24H-TAB (GLIPIZIDE) one daily  #90 x 6   Entered and Authorized by:   Gordy Savers  MD   Signed by:   Gordy Savers  MD on 07/16/2010   Method used:   Electronically to        [facility] #[id number]* (retail)       [address] [place], [place]  [address]       Ph: [phone]       Fax: [phone]   RxID:   [id number] BD MICROTAINER LANCETS  MISC (LANCETS) use as directed  #90 x 6   Entered and Authorized by:   Gordy Savers  MD   Signed by:   Gordy Savers  MD on 07/16/2010   Method used:   Electronically to        [facility] #[id number]* (retail)       [address] [place], [place]  [address]       Ph: [phone]       Fax: [phone]   RxID:   [id number] METFORMIN HCL 1000 MG TABS (METFORMIN HCL) one tablet at bedtime  #90 x 6   Entered and Authorized by:   Gordy Savers  MD   Signed by:   Gordy Savers  MD on 07/16/2010   Method used:   Electronically to        [facility] #[id number]* (retail)       [address] [place], [place]  [address]       Ph: [phone]       Fax: [phone]   RxID:   [id number] LISINOPRIL-HYDROCHLOROTHIAZIDE 20-12.5 MG  TABS (LISINOPRIL-HYDROCHLOROTHIAZIDE) 2 every am  #100.0 Each x 2   Entered and Authorized by:   Gordy Savers  MD   Signed by:   Gordy Savers  MD on 07/16/2010   Method used:   Electronically to        [facility] #[id number]* (retail)       [address] [place], [place]  [address]       Ph: [phone]       Fax: [phone]   RxID:   [id number] LIPITOR 20 MG  TABS (ATORVASTATIN CALCIUM) 1 once daily  #  90.0 Each x 2   Entered and Authorized by:   Gordy Savers  MD   Signed by:   Gordy Savers  MD on 07/16/2010   Method  used:   Electronically to        [facility] #[id number]* (retail)       [address] [place], [place]  [address]       Ph: [phone]       Fax: [phone]   RxID:   [id number] [place] TEST  STRP (GLUCOSE BLOOD) Use as directed  #100 x 6   Entered and Authorized by:   Gordy Savers  MD   Signed by:   Gordy Savers  MD on 07/16/2010   Method used:   Electronically to        [facility] #[id number]* (retail)       [address] [place], [place]  [address]       Ph: [phone]       Fax: [phone]   RxID:   [id number]   Appended Document: 3 month rov/njr Procedure Note:  Cryotherapy  The patient was noted to have a wart involving her left thumb.  This had become irritated and cosmetically unacceptable.  The area was cleaned with an  alcohol swab and treated with cryotherapy.  She tolerated procedure well.  A Band-Aid was placed.  Expected skin changes, and subsequent skin care discussed

## 2010-12-27 NOTE — Assessment & Plan Note (Signed)
Summary: 3  MONTH ROV/NJR   Vital Signs:  Patient profile:   77 year old female Weight:      117 pounds Temp:     98.2 degrees F oral BP sitting:   122 / 70  (right arm) Cuff size:   regular  Vitals Entered By: Duard Brady LPN (October 08, 2010 10:30 AM)  CC: 3 mos rov - doing well    fbs 185 Is Patient Diabetic? Yes Did you bring your meter with you today? No   CC:  3 mos rov - doing well    fbs 185.  History of Present Illness: 77 year old patient who is seen today for follow-up.  She has a history of type 2 diabetes, on glipizide, as well as metformin at bedtime.  She has self discontinued the latter drug although has been on it for some years.  She remains on Lipitor for dyslipidemia, as well as lisinopril for blood pressure control. She is complaining of a recurrent wart involving the dorsal surface of her left index finger.   Flu Vaccine Consent Questions     Do you have a history of severe allergic reactions to this vaccine? no    Any prior history of allergic reactions to egg and/or gelatin? no    Do you have a sensitivity to the preservative Thimersol? no    Do you have a past history of Guillan-Barre Syndrome? no    Do you currently have an acute febrile illness? no    Have you ever had a severe reaction to latex? no    Vaccine information given and explained to patient? yes    Are you currently pregnant? no    Lot Number:[id number]   Exp Date:05/25/2011   Site Given  Left Deltoid IM   Allergies (verified): No Known Drug Allergies  Past History:  Past Medical History: Reviewed history from 11/30/2009 and no changes required. Diabetes mellitus, type II Hyperlipidemia Hypertension Cervical cancer, hx of trigeminal neuralgia Raynaud's phenomenon  Past Surgical History: Reviewed history from 08/20/2007 and no changes required. Breast biopsy Hysterectomy breast implants hysterectomy 1963, for cervical cancer in situ  Family History: Reviewed  history from 08/20/2007 and no changes required. both parents died  in their 99s natural causes, details unclear 6 brothers 6 sisters denies any family history of diabetes, cancer or premature heart disease  Review of Systems       The patient complains of suspicious skin lesions.  The patient denies anorexia, fever, weight loss, weight gain, vision loss, decreased hearing, hoarseness, chest pain, syncope, dyspnea on exertion, peripheral edema, prolonged cough, headaches, hemoptysis, abdominal pain, melena, hematochezia, severe indigestion/heartburn, hematuria, incontinence, genital sores, muscle weakness, transient blindness, difficulty walking, depression, unusual weight change, abnormal bleeding, enlarged lymph nodes, angioedema, and breast masses.    Physical Exam  General:  Well-developed,well-nourished,in no acute distress; alert,appropriate and cooperative throughout examination Head:  Normocephalic and atraumatic without obvious abnormalities. No apparent alopecia or balding. Eyes:  No corneal or conjunctival inflammation noted. EOMI. Perrla. Funduscopic exam benign, without hemorrhages, exudates or papilledema. Vision grossly normal. Mouth:  Oral mucosa and oropharynx without lesions or exudates.  Teeth in good repair. Neck:  No deformities, masses, or tenderness noted. Lungs:  Normal respiratory effort, chest expands symmetrically. Lungs are clear to auscultation, no crackles or wheezes. Heart:  Normal rate and regular rhythm. S1 and S2 normal without gallop, murmur, click, rub or other extra sounds. Abdomen:  Bowel sounds positive,abdomen soft and non-tender without masses, organomegaly or hernias  noted. Msk:  No deformity or scoliosis noted of thoracic or lumbar spine.   Pulses:  the left the posterior tibial, and the right dorsalis pedis pulse diminished Skin:  a 5 mm verrucal  lesion was present involving the distal left dorsal index finger Cervical Nodes:  No lymphadenopathy  noted   Impression & Recommendations:  Problem # 1:  WARTS, HAND (ICD-078.10)  Orders: Cryotherapy/Destruction benign or premalignant lesion (1st lesion)  (17000)  Problem # 2:  HYPERTENSION (ICD-401.9)  Her updated medication list for this problem includes:    Lisinopril-hydrochlorothiazide 20-12.5 Mg Tabs (Lisinopril-hydrochlorothiazide) .[name]... 2 every am  Her updated medication list for this problem includes:    Lisinopril-hydrochlorothiazide 20-12.5 Mg Tabs (Lisinopril-hydrochlorothiazide) .[name]... 2 every am  Problem # 3:  DIABETES MELLITUS, TYPE II (ICD-250.00)  Her updated medication list for this problem includes:    Lisinopril-hydrochlorothiazide 20-12.5 Mg Tabs (Lisinopril-hydrochlorothiazide) .[name]... 2 every am    Metformin Hcl 1000 Mg Tabs (Metformin hcl) ..... One tablet at bedtime    Glipizide Xl 5 Mg Xr24h-tab (Glipizide) ..... One daily    Her updated medication list for this problem includes:    Lisinopril-hydrochlorothiazide 20-12.5 Mg Tabs (Lisinopril-hydrochlorothiazide) .[name]... 2 every am    Metformin Hcl 1000 Mg Tabs (Metformin hcl) ..... One tablet at bedtime    Glipizide Xl 5 Mg Xr24h-tab (Glipizide) ..... One daily  Orders: Venipuncture (16109) TLB-A1C / Hgb A1C (Glycohemoglobin) (83036-A1C)  Complete Medication List: 1)  Onetouch Ultra Test Strp (Glucose blood) .... Use as directed 2)  Lipitor 20 Mg Tabs (Atorvastatin calcium) .[name].. 1 once daily 3)  Lisinopril-hydrochlorothiazide 20-12.5 Mg Tabs (Lisinopril-hydrochlorothiazide) .... 2 every am 4)  Hydrocodone-homatropine 5-1.5 Mg/33ml Syrp (Hydrocodone-homatropine) .[name].. 1 teaspoon every 6 hours as needed for cough 5)  Metformin Hcl 1000 Mg Tabs (Metformin hcl) .... One tablet at bedtime 6)  Bd Microtainer Lancets Misc (Lancets) .... Use as directed 7)  Glipizide Xl 5 Mg Xr24h-tab (Glipizide) .... One daily 8)  Ra Triple Antibiotic Plus 1 % Oint (Neomy-bacit-polymyx-pramoxine) .... Apply twice daily  Other  Orders: Flu Vaccine 31yrs + MEDICARE PATIENTS (U0454) Administration Flu vaccine - MCR (U9811)  Patient Instructions: 1)  Please schedule a follow-up appointment in 3 months. 2)  Limit your Sodium (Salt). 3)  It is important that you exercise regularly at least 20 minutes 5 times a week. If you develop chest pain, have severe difficulty breathing, or feel very tired , stop exercising immediately and seek medical attention. 4)  Take calcium +Vitamin D daily. 5)  Check your blood sugars regularly. If your readings are usually above : or below 70 you should contact our office. 6)  It is important that your Diabetic A1c level is checked every 3 months. 7)  See your eye doctor yearly to check for diabetic eye damage. Prescriptions: GLIPIZIDE XL 5 MG XR24H-TAB (GLIPIZIDE) one daily  #90 x 6   Entered and Authorized by:   Gordy Savers  MD   Signed by:   Gordy Savers  MD on 10/08/2010   Method used:   Electronically to        [facility] #[id number]* (retail)       [address] [place], [place]  [address]       Ph: [phone]       Fax: [phone]   RxID:   [id number] BD MICROTAINER LANCETS  MISC (LANCETS) use as directed  #90 x 6   Entered and  Authorized by:   Gordy Savers  MD   Signed by:   Gordy Savers  MD on 10/08/2010   Method used:   Electronically to        [facility] #[id number]* (retail)       [address] [place], [place]  [address]       Ph: [phone]       Fax: [phone]   RxID:   [id number] METFORMIN HCL 1000 MG TABS (METFORMIN HCL) one tablet at bedtime  #90 x 6   Entered and Authorized by:   Gordy Savers  MD   Signed by:   Gordy Savers  MD on 10/08/2010   Method used:   Electronically to        [facility] #[id number]* (retail)       [address] [place], [place]  [address]       Ph: [phone]       Fax: [phone]   RxID:    [id number] LISINOPRIL-HYDROCHLOROTHIAZIDE 20-12.5 MG  TABS (LISINOPRIL-HYDROCHLOROTHIAZIDE) 2 every am  #100.0 Each x 2   Entered and Authorized by:   Gordy Savers  MD   Signed by:   Gordy Savers  MD on 10/08/2010   Method used:   Electronically to        [facility] #[id number]* (retail)       [address] [place], [place]  [address]       Ph: [phone]       Fax: [phone]   RxID:   [id number] LIPITOR 20 MG  TABS (ATORVASTATIN CALCIUM) 1 once daily  #90.0 Each x 2   Entered and Authorized by:   Gordy Savers  MD   Signed by:   Gordy Savers  MD on 10/08/2010   Method used:   Electronically to        [facility] #[id number]* (retail)       [address] [place], [place]  [address]       Ph: [phone]       Fax: [phone]   RxID:   [id number] [place] TEST  STRP (GLUCOSE BLOOD) Use as directed  #100 x 6   Entered and Authorized by:   Gordy Savers  MD   Signed by:   Gordy Savers  MD on 10/08/2010   Method used:   Electronically to        [facility] #[id number]* (retail)       [address] [place], [place]  [address]       Ph: [phone]       Fax: [phone]   RxID:   [id number]    Orders Added: 1)  Flu Vaccine 18yrs + MEDICARE PATIENTS [Q2039] 2)  Administration Flu vaccine - MCR [G0008] 3)  Est. Patient Level III [24268] 4)  Cryotherapy/Destruction benign or premalignant lesion (1st lesion)  [17000] 5)  Venipuncture [36415] 6)  TLB-A1C / Hgb A1C (Glycohemoglobin) [83036-A1C]

## 2011-01-04 ENCOUNTER — Encounter: Payer: Self-pay | Admitting: Internal Medicine

## 2011-01-07 ENCOUNTER — Ambulatory Visit (INDEPENDENT_AMBULATORY_CARE_PROVIDER_SITE_OTHER): Payer: Medicare Other | Admitting: Internal Medicine

## 2011-01-07 ENCOUNTER — Encounter: Payer: Self-pay | Admitting: Internal Medicine

## 2011-01-07 DIAGNOSIS — I10 Essential (primary) hypertension: Secondary | ICD-10-CM

## 2011-01-07 DIAGNOSIS — E119 Type 2 diabetes mellitus without complications: Secondary | ICD-10-CM

## 2011-01-07 DIAGNOSIS — I7300 Raynaud's syndrome without gangrene: Secondary | ICD-10-CM

## 2011-01-07 DIAGNOSIS — E785 Hyperlipidemia, unspecified: Secondary | ICD-10-CM

## 2011-01-07 MED ORDER — METFORMIN HCL 1000 MG PO TABS: 1000.0000 mg | ORAL_TABLET | Freq: Two times a day (BID) | ORAL | Status: DC

## 2011-01-07 NOTE — Patient Instructions (Signed)
Limit your sodium (Salt) intake   Please check your hemoglobin A1c every 3 months    It is important that you exercise regularly, at least 20 minutes 3 to 4 times per week.  If you develop chest pain or shortness of breath seek  medical attention.  Return in 3 months for follow-up  

## 2011-01-07 NOTE — Progress Notes (Signed)
  Subjective:    Patient ID: [name], female    DOB: 30-Mar-1934, 77 y.o.   MRN: [id number]  HPI  77 -year-old patient who is seen today for follow-up.she has a history of type 2 diabetes.  Her glycemic control has been stable.  Her last hemoglobin A1c7.2 per Chest her hypertension, which has been well-controlled.  She has a history  Of Raynaud's  which has been stable through the winter.  She has dyslipidemia, well controlled on atorvastatin 20 mg daily. She denies any cardiopulmonary complaints. Review of Systems  Constitutional: Negative.   HENT: Negative for hearing loss, congestion, sore throat, rhinorrhea, dental problem, sinus pressure and tinnitus.   Eyes: Negative for pain, discharge and visual disturbance.  Respiratory: Negative for cough and shortness of breath.   Cardiovascular: Negative for chest pain, palpitations and leg swelling.  Gastrointestinal: Negative for nausea, vomiting, abdominal pain, diarrhea, constipation, blood in stool and abdominal distention.  Genitourinary: Negative for dysuria, urgency, frequency, hematuria, flank pain, vaginal bleeding, vaginal discharge, difficulty urinating, vaginal pain and pelvic pain.  Musculoskeletal: Negative for joint swelling, arthralgias and gait problem.  Skin: Negative for rash.  Neurological: Negative for dizziness, syncope, speech difficulty, weakness, numbness and headaches.  Hematological: Negative for adenopathy.  Psychiatric/Behavioral: Negative for behavioral problems, dysphoric mood and agitation. The patient is not nervous/anxious.        Objective:   Physical Exam  Constitutional: She is oriented to person, place, and time. She appears well-developed and well-nourished.  HENT:  Head: Normocephalic.  Right Ear: External ear normal.  Left Ear: External ear normal.  Mouth/Throat: Oropharynx is clear and moist.  Eyes: Conjunctivae and EOM are normal. Pupils are equal, round, and reactive to light.    Neck: Normal range of motion. Neck supple. No thyromegaly present.  Cardiovascular: Normal rate, regular rhythm, normal heart sounds and intact distal pulses.   Pulmonary/Chest: Effort normal and breath sounds normal.  Abdominal: Soft. Bowel sounds are normal. She exhibits no mass. There is no tenderness.  Musculoskeletal: Normal range of motion.  Lymphadenopathy:    She has no cervical adenopathy.  Neurological: She is alert and oriented to person, place, and time.  Skin: Skin is warm and dry. No rash noted.  Psychiatric: She has a normal mood and affect. Her behavior is normal.          Assessment & Plan:  Diabetes mellitus two-  One check a hemoglobin A1C.  Have encouraged the patient to take metformin b.i.d. Hypertension stable.  Will continue present regimen Raynaud's- stable Dyslipidemia, well controlled on atorvastatin 20 mg dail;  will do a lipid panel at the time of her physical next visit

## 2011-04-09 ENCOUNTER — Encounter: Payer: Self-pay | Admitting: Internal Medicine

## 2011-04-09 ENCOUNTER — Ambulatory Visit (INDEPENDENT_AMBULATORY_CARE_PROVIDER_SITE_OTHER): Payer: Medicare Other | Admitting: Internal Medicine

## 2011-04-09 VITALS — BP 110/76 | HR 70 | Temp 97.8°F | Resp 16 | Ht <= 58 in | Wt 110.0 lb

## 2011-04-09 DIAGNOSIS — E119 Type 2 diabetes mellitus without complications: Secondary | ICD-10-CM

## 2011-04-09 DIAGNOSIS — Z0000 Encounter for general adult medical examination without abnormal findings: Secondary | ICD-10-CM

## 2011-04-09 DIAGNOSIS — E785 Hyperlipidemia, unspecified: Secondary | ICD-10-CM

## 2011-04-09 DIAGNOSIS — I10 Essential (primary) hypertension: Secondary | ICD-10-CM

## 2011-04-09 LAB — LIPID PANEL
Cholesterol: 138 mg/dL (ref 0–200)
HDL: 40.9 mg/dL (ref >39.00)
LDL Cholesterol: 67 mg/dL (ref 0–99)
Total CHOL/HDL Ratio: 3
Triglycerides: 153 mg/dL — ABNORMAL HIGH (ref 0.0–149.0)
VLDL: 30.6 mg/dL (ref 0.0–40.0)

## 2011-04-09 LAB — CBC WITH DIFFERENTIAL/PLATELET
Basophils Relative: 0.3 % (ref 0.0–3.0)
Eosinophils Relative: 0.5 % (ref 0.0–5.0)
HCT: 40.8 % (ref 36.0–46.0)
Lymphs Abs: 2.8 10*3/uL (ref 0.7–4.0)
MCV: 94 fl (ref 78.0–100.0)
Monocytes Absolute: 0.4 10*3/uL (ref 0.1–1.0)
Platelets: 239 10*3/uL (ref 150.0–400.0)
WBC: 10.4 10*3/uL (ref 4.5–10.5)

## 2011-04-09 LAB — HEPATIC FUNCTION PANEL
ALT: 16 U/L (ref 0–35)
Total Protein: 6 g/dL (ref 6.0–8.3)

## 2011-04-09 LAB — BASIC METABOLIC PANEL
BUN: 20 mg/dL (ref 6–23)
Chloride: 104 mEq/L (ref 96–112)
Potassium: 3.9 mEq/L (ref 3.5–5.1)

## 2011-04-09 LAB — TSH: TSH: 0.94 u[IU]/mL (ref 0.35–5.50)

## 2011-04-09 MED ORDER — GLUCOSE BLOOD VI STRP: ORAL_STRIP | Status: DC

## 2011-04-09 MED ORDER — ATORVASTATIN CALCIUM 20 MG PO TABS: 20.0000 mg | ORAL_TABLET | Freq: Every day | ORAL | Status: DC

## 2011-04-09 MED ORDER — GLIPIZIDE ER 5 MG PO TB24: 5.0000 mg | ORAL_TABLET | Freq: Every day | ORAL | Status: DC

## 2011-04-09 MED ORDER — METFORMIN HCL 1000 MG PO TABS: 500.0000 mg | ORAL_TABLET | Freq: Every day | ORAL | Status: DC

## 2011-04-09 MED ORDER — LISINOPRIL 20 MG PO TABS: 20.0000 mg | ORAL_TABLET | Freq: Every day | ORAL | Status: DC

## 2011-04-09 NOTE — Progress Notes (Signed)
Subjective:    Patient ID: [name], female    DOB: 10-09-1934, 77 y.o.   MRN: [id number]  HPI  77 year old patient who is seen today for a health maintenance exam. She has a history of type 2 diabetes and she has down titrated her metformin due to mild hypoglycemia. She also describes frequent episodes of dizziness especially with bending and stooping over. Blood pressure regimen includes combination ACE inhibition and diuretic therapy. She denies any cardiopulmonary complaints unfortunately she continues to smoke  1. Risk factors, based on past  M,S,F history- cardiovascular risk factors include hypertension dyslipidemia diabetes and ongoing tobacco use  2.  Physical activities: Remains quite active goes to the health club frequently  3.  Depression/mood: No history depression or mood disorder  4.  Hearing: No deficits  5.  ADL's: Independent in all aspects of daily living 6.  Fall risk: Low  7.  Home safety: No problems identified  8.  Height weight, and visual acuity; height and weight stable no change in visual acuity  9.  Counseling: Heart healthy diet restricted salt diet also encouraged regular exercise recommended  10. Lab orders based on risk factors: Laboratory profile including lipid panel will be reviewed  11. Referral : None appropriate at this time  12. Care plan: Heart healthy diet regular exercise encouraged  13. Cognitive assessment: Alert and appropriate normal affect no cognitive dysfunction  Past Medical History  Diagnosis Date  . Diabetes mellitus, type 2   . Hyperlipidemia   . Hypertension   . History of cervical cancer   . TN (trigeminal neuralgia)   . Raynaud's phenomenon    Past Surgical History  Procedure Date  . Breast biopsy   . Breast enhancement surgery   . Total abdominal hysterectomy 1963    for cervical cancer in situ    reports that she has been smoking.  She does not have any smokeless tobacco history on file. Her  alcohol and drug histories not on file. family history is not on file. No Known Allergies    Alcohol-Tobacco  Smoking Status: current  Allergies (verified):  No Known Drug Allergies  Past History:  Past Medical History:  Reviewed history from 11/30/2009 and no changes required.  Diabetes mellitus, type II  Hyperlipidemia  Hypertension  Cervical cancer, hx of  trigeminal neuralgia  Raynaud's phenomenon    Family History:  both parents died in their 85s natural causes, details unclear  6 brothers 6 sisters - ONLY ONE DECEASED AT 31 denies any family history of diabetes, cancer or premature heart disease     Review of Systems  Constitutional: Negative for fever, appetite change, fatigue and unexpected weight change.  HENT: Negative for hearing loss, ear pain, nosebleeds, congestion, sore throat, mouth sores, trouble swallowing, neck stiffness, dental problem, voice change, sinus pressure and tinnitus.   Eyes: Negative for photophobia, pain, redness and visual disturbance.  Respiratory: Negative for cough, chest tightness and shortness of breath.   Cardiovascular: Negative for chest pain, palpitations and leg swelling.  Gastrointestinal: Negative for nausea, vomiting, abdominal pain, diarrhea, constipation, blood in stool, abdominal distention and rectal pain.  Genitourinary: Negative for dysuria, urgency, frequency, hematuria, flank pain, vaginal bleeding, vaginal discharge, difficulty urinating, genital sores, vaginal pain, menstrual problem and pelvic pain.  Musculoskeletal: Negative for back pain and arthralgias.  Skin: Negative for rash.  Neurological: Positive for light-headedness. Negative for dizziness, syncope, speech difficulty, weakness, numbness and headaches.  Hematological: Negative for adenopathy. Does not bruise/bleed easily.  Psychiatric/Behavioral: Negative for suicidal ideas, behavioral problems, self-injury, dysphoric mood and agitation. The patient is not  nervous/anxious.        Objective:   Physical Exam  Constitutional: She is oriented to person, place, and time. She appears well-developed and well-nourished.  HENT:  Head: Normocephalic and atraumatic.  Right Ear: External ear normal.  Left Ear: External ear normal.  Mouth/Throat: Oropharynx is clear and moist.  Eyes: Conjunctivae and EOM are normal.  Neck: Normal range of motion. Neck supple. No JVD present. No thyromegaly present.  Cardiovascular: Normal rate, regular rhythm and normal heart sounds.   No murmur heard.      The left posterior tibial and the right dorsalis pedis pulse is diminished  Pulmonary/Chest: Effort normal and breath sounds normal. She has no wheezes. She has no rales.  Abdominal: Soft. Bowel sounds are normal. She exhibits no distension and no mass. There is no tenderness. There is no rebound and no guarding.  Genitourinary:       Declines  Musculoskeletal: Normal range of motion. She exhibits no edema and no tenderness.  Neurological: She is alert and oriented to person, place, and time. She has normal reflexes. No cranial nerve deficit. She exhibits normal muscle tone. Coordination normal.  Skin: Skin is warm and dry. No rash noted.  Psychiatric: She has a normal mood and affect. Her behavior is normal.          Assessment & Plan:   Annual health assessment Diabetes mellitus type 2. Will decrease metformin to 500 mg daily Hypertension. We'll discontinue hydrochlorothiazide Dyslipidemia. Lipid profile reviewed  We'll recheck in 3 months

## 2011-04-09 NOTE — Progress Notes (Signed)
Quick Note:  Attempt to call hm# - no ans no mach - will try in AM KIK ______

## 2011-04-09 NOTE — Patient Instructions (Signed)
Limit your sodium (Salt) intake    It is important that you exercise regularly, at least 20 minutes 3 to 4 times per week.  If you develop chest pain or shortness of breath seek  medical attention.  Smoking tobacco is very bad for your health. You should stop smoking immediately.   Please check your hemoglobin A1c every 3 months

## 2011-04-10 NOTE — Progress Notes (Signed)
Quick Note:  Spoke with pt - inofrmed of lab results and dr. Vernon Prey instruction to take metformin as rx'd . KIK ______
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
## 2011-04-12 NOTE — Op Note (Signed)
[place]. [facility]  Patient:    [name], [name]             MRN: [id number] Proc. Date: 05/29/00 Attending:  Jimmye Norman, M.D.                           Operative Report  PREOPERATIVE DIAGNOSIS:  Right breast mass, status post silicone injection.  POSTOPERATIVE DIAGNOSIS:  Right breast mass, status post silicone injection.  OPERATION PERFORMED:  Right breast biopsy.  SURGEON:  Jimmye Norman, M.D.  ASSISTANT:  None.  ANESTHESIA:  Monitored anesthesia care with 1% Xylocaine and 0.25% Marcaine anesthetic.  ESTIMATED BLOOD LOSS:  Less than 20 cc.  COMPLICATIONS:  None.  CONDITION:  Stable.  INDICATIONS FOR PROCEDURE:  The patient is a 77 year old female with a history of silicone injections in both breasts, status post needle biopsy of the right breast and an open biopsy of the left breast who now comes in with suspicious changes on recent mammogram and requirement for breast biopsy.  FINDINGS:  The patient had a lot of oily silicone injection reaction in the right breast in the area of the mass which was in the mid to upper outer portion of the right breast.  A large piece of this reactive breast tissue and some underlying breast tissue was removed and sent for specimen fresh but no frozen.  DESCRIPTION OF PROCEDURE:  The patient was taken to the operating room and placed on the table in supine position.  After an adequate amount of IV sedation was given, she was prepped and draped in the usual sterile manner exposing the right breast.  Injection with a 27 gauge needle with 1% Xylocaine was done into the subcutaneous and the subcuticular skin.  We made a transverse curvilinear incision approximately 3.5 to 4 cm long using a #15 blade down into the subcutaneous tissues.  We subsequently dissected out this large oily reactive breast tissue using blunt and sharp dissection with electrocautery, 15 blade and finger dissection.  We removed  multiple specimens, irrigated with saline, attained adequate hemostasis using electrocautery and then closed in three layers with a deep subcu layer of 3-0 Vicryl, more superficial subcuticular layer and 4-0 Vicryl and the skin was closed using a running  subcuticular stitch of 5-0 Vicryl.  Sterile dressings were applied along with Steri-Strips.  All sponge, needle and instrument counts were correct. DD:  05/29/00 TD:  05/29/00 Job: [id number]

## 2011-07-10 ENCOUNTER — Ambulatory Visit: Payer: BC Managed Care – PPO | Admitting: Internal Medicine

## 2011-07-23 ENCOUNTER — Encounter: Payer: Self-pay | Admitting: Internal Medicine

## 2011-07-23 ENCOUNTER — Ambulatory Visit (INDEPENDENT_AMBULATORY_CARE_PROVIDER_SITE_OTHER): Payer: Medicare Other | Admitting: Internal Medicine

## 2011-07-23 DIAGNOSIS — I10 Essential (primary) hypertension: Secondary | ICD-10-CM

## 2011-07-23 DIAGNOSIS — E119 Type 2 diabetes mellitus without complications: Secondary | ICD-10-CM

## 2011-07-23 MED ORDER — METFORMIN HCL 1000 MG PO TABS: 1000.0000 mg | ORAL_TABLET | Freq: Two times a day (BID) | ORAL | Status: DC

## 2011-07-23 NOTE — Progress Notes (Signed)
  Subjective:    Patient ID: [name], female    DOB: 1934/09/01, 77 y.o.   MRN: [id number]  HPI  77 year old patient who is seen today for followup of her type 2 diabetes. She has a history of hypertension. She is doing quite well today. Earlier blood sugars were elevated and her oral medications have been up titrated. Earlier she had some mild hypoglycemia. It seems that she has misunderstood instructions apparent she is taking glipizide 5 mg twice a day and metformin 1000 mg at bedtime only she has been on metformin 1000 mg twice a day which she has tolerated well    Review of Systems  Constitutional: Negative.   HENT: Negative for hearing loss, congestion, sore throat, rhinorrhea, dental problem, sinus pressure and tinnitus.   Eyes: Negative for pain, discharge and visual disturbance.  Respiratory: Negative for cough and shortness of breath.   Cardiovascular: Negative for chest pain, palpitations and leg swelling.  Gastrointestinal: Negative for nausea, vomiting, abdominal pain, diarrhea, constipation, blood in stool and abdominal distention.  Genitourinary: Negative for dysuria, urgency, frequency, hematuria, flank pain, vaginal bleeding, vaginal discharge, difficulty urinating, vaginal pain and pelvic pain.  Musculoskeletal: Negative for joint swelling, arthralgias and gait problem.  Skin: Negative for rash.  Neurological: Negative for dizziness, syncope, speech difficulty, weakness, numbness and headaches.  Hematological: Negative for adenopathy.  Psychiatric/Behavioral: Negative for behavioral problems, dysphoric mood and agitation. The patient is not nervous/anxious.        Objective:   Physical Exam  Constitutional: She is oriented to person, place, and time. She appears well-developed and well-nourished.  HENT:  Head: Normocephalic.  Right Ear: External ear normal.  Left Ear: External ear normal.  Mouth/Throat: Oropharynx is clear and moist.  Eyes:  Conjunctivae and EOM are normal. Pupils are equal, round, and reactive to light.  Neck: Normal range of motion. Neck supple. No thyromegaly present.  Cardiovascular: Normal rate, regular rhythm, normal heart sounds and intact distal pulses.   Pulmonary/Chest: Effort normal and breath sounds normal.  Abdominal: Soft. Bowel sounds are normal. She exhibits no mass. There is no tenderness.  Musculoskeletal: Normal range of motion.  Lymphadenopathy:    She has no cervical adenopathy.  Neurological: She is alert and oriented to person, place, and time.  Skin: Skin is warm and dry. No rash noted.  Psychiatric: She has a normal mood and affect. Her behavior is normal.          Assessment & Plan:    Diabetes mellitus. We'll increase metformin to 1000 mg twice a day. We'll continue glipizide 5 mg daily Hypertension well controlled  We'll recheck in 3 months and a hemoglobin A1c checked at that time

## 2011-07-23 NOTE — Patient Instructions (Signed)
It is important that you exercise regularly, at least 20 minutes 3 to 4 times per week.  If you develop chest pain or shortness of breath seek  medical attention.  Limit your sodium (Salt) intake   Please check your hemoglobin A1c every 3 months   

## 2011-08-23 ENCOUNTER — Ambulatory Visit
Admission: RE | Admit: 2011-08-23 | Discharge: 2011-08-23 | Disposition: A | Discharge status: Home / Self Care | Payer: Medicare Other | Source: Ambulatory Visit | Attending: Family Medicine | Admitting: Family Medicine

## 2011-08-23 ENCOUNTER — Other Ambulatory Visit: Payer: Self-pay | Admitting: Family Medicine

## 2011-08-23 DIAGNOSIS — R05 Cough: Secondary | ICD-10-CM

## 2011-10-14 ENCOUNTER — Other Ambulatory Visit: Payer: Self-pay | Admitting: Internal Medicine

## 2011-10-25 ENCOUNTER — Encounter: Payer: Self-pay | Admitting: Internal Medicine

## 2011-10-25 ENCOUNTER — Ambulatory Visit (INDEPENDENT_AMBULATORY_CARE_PROVIDER_SITE_OTHER): Payer: Medicare Other | Admitting: Internal Medicine

## 2011-10-25 DIAGNOSIS — F4489 Other dissociative and conversion disorders: Secondary | ICD-10-CM | POA: Insufficient documentation

## 2011-10-25 DIAGNOSIS — E119 Type 2 diabetes mellitus without complications: Secondary | ICD-10-CM

## 2011-10-25 DIAGNOSIS — E785 Hyperlipidemia, unspecified: Secondary | ICD-10-CM

## 2011-10-25 DIAGNOSIS — Z0000 Encounter for general adult medical examination without abnormal findings: Secondary | ICD-10-CM

## 2011-10-25 DIAGNOSIS — I10 Essential (primary) hypertension: Secondary | ICD-10-CM

## 2011-10-25 DIAGNOSIS — Z23 Encounter for immunization: Secondary | ICD-10-CM

## 2011-10-25 DIAGNOSIS — F29 Unspecified psychosis not due to a substance or known physiological condition: Secondary | ICD-10-CM

## 2011-10-25 LAB — HM DIABETES FOOT EXAM

## 2011-10-25 MED ORDER — ASPIRIN EC 81 MG PO TBEC: 81.0000 mg | DELAYED_RELEASE_TABLET | Freq: Every day | ORAL | Status: AC

## 2011-10-25 NOTE — Progress Notes (Signed)
  Subjective:    Patient ID: [name], female    DOB: 1934/01/19, 77 y.o.   MRN: [id number]  HPI 77 year old patient who has a history of type 2 diabetes dyslipidemia and hypertension. Approximately 2 weeks ago she had what she describes as an acute confusional state for approximately 15 minutes she had a difficult time understanding her husband and appeared to be quite confused. She has had no prior or subsequent episodes. The patient does not recall checking a blood sugar for hypoglycemia. She has had no prior focal neurological symptoms.   Review of Systems  Constitutional: Negative.   HENT: Negative for hearing loss, congestion, sore throat, rhinorrhea, dental problem, sinus pressure and tinnitus.   Eyes: Negative for pain, discharge and visual disturbance.  Respiratory: Negative for cough and shortness of breath.   Cardiovascular: Negative for chest pain, palpitations and leg swelling.  Gastrointestinal: Negative for nausea, vomiting, abdominal pain, diarrhea, constipation, blood in stool and abdominal distention.  Genitourinary: Negative for dysuria, urgency, frequency, hematuria, flank pain, vaginal bleeding, vaginal discharge, difficulty urinating, vaginal pain and pelvic pain.  Musculoskeletal: Negative for joint swelling, arthralgias and gait problem.  Skin: Negative for rash.  Neurological: Negative for dizziness, syncope, speech difficulty, weakness, numbness and headaches.  Hematological: Negative for adenopathy.  Psychiatric/Behavioral: Positive for confusion. Negative for behavioral problems, dysphoric mood and agitation. The patient is not nervous/anxious.        Objective:   Physical Exam  Constitutional: She is oriented to person, place, and time. She appears well-developed and well-nourished.  HENT:  Head: Normocephalic.  Right Ear: External ear normal.  Left Ear: External ear normal.  Mouth/Throat: Oropharynx is clear and moist.  Eyes: Conjunctivae and EOM are  normal. Pupils are equal, round, and reactive to light.  Neck: Normal range of motion. Neck supple. No thyromegaly present.       No bruits  Cardiovascular: Normal rate, regular rhythm, normal heart sounds and intact distal pulses.   Pulmonary/Chest: Effort normal and breath sounds normal.  Abdominal: Soft. Bowel sounds are normal. She exhibits no mass. There is no tenderness.  Musculoskeletal: Normal range of motion.  Lymphadenopathy:    She has no cervical adenopathy.  Neurological: She is alert and oriented to person, place, and time.       Nonfocal exam Finger-to-nose testing performed normal bilaterally No drift of the outstretched arms  Skin: Skin is warm and dry. No rash noted.  Psychiatric: She has a normal mood and affect. Her behavior is normal. Judgment and thought content normal.          Assessment & Plan:   Status post acute confusional state. Unclear whether this may have represented hypoglycemia. Her clinical exam is unremarkable. We'll check her carotid arteries to rule out carotid artery stenosis. We'll place on daily low-dose aspirin. We'll continue aggressive risk factor modification with tight blood pressure control and lipid control. We'll check a hemoglobin A1c  Diabetes mellitus Hypertension Dyslipidemia  Daily aspirin therapy will be initiated We'll check a carotid artery Doppler study Recheck 3 months Hemoglobin A1c checked

## 2011-10-25 NOTE — Patient Instructions (Signed)
Take 81 mg of aspirin daily Smoking tobacco is very bad for your health. You should stop smoking immediately.  Return in 3 months for follow-up

## 2011-10-29 ENCOUNTER — Encounter (INDEPENDENT_AMBULATORY_CARE_PROVIDER_SITE_OTHER): Payer: Medicare Other | Admitting: *Deleted

## 2011-10-29 DIAGNOSIS — F4489 Other dissociative and conversion disorders: Secondary | ICD-10-CM

## 2011-10-29 DIAGNOSIS — R55 Syncope and collapse: Secondary | ICD-10-CM

## 2011-10-29 DIAGNOSIS — I6529 Occlusion and stenosis of unspecified carotid artery: Secondary | ICD-10-CM

## 2011-11-01 NOTE — Progress Notes (Signed)
Quick Note:  Spoke with pt- informed of normal results ______

## 2011-12-19 ENCOUNTER — Other Ambulatory Visit: Payer: Self-pay | Admitting: Internal Medicine

## 2012-02-20 ENCOUNTER — Encounter: Payer: Self-pay | Admitting: Internal Medicine

## 2012-02-20 ENCOUNTER — Ambulatory Visit (INDEPENDENT_AMBULATORY_CARE_PROVIDER_SITE_OTHER): Payer: Medicare Other | Admitting: Internal Medicine

## 2012-02-20 VITALS — BP 120/80 | Temp 97.7°F | Wt 112.0 lb

## 2012-02-20 DIAGNOSIS — Z720 Tobacco use: Secondary | ICD-10-CM

## 2012-02-20 DIAGNOSIS — I10 Essential (primary) hypertension: Secondary | ICD-10-CM

## 2012-02-20 DIAGNOSIS — E119 Type 2 diabetes mellitus without complications: Secondary | ICD-10-CM

## 2012-02-20 DIAGNOSIS — F17200 Nicotine dependence, unspecified, uncomplicated: Secondary | ICD-10-CM | POA: Diagnosis not present

## 2012-02-20 LAB — HEMOGLOBIN A1C: Hgb A1c MFr Bld: 6.4 % (ref 4.6–6.5)

## 2012-02-20 MED ORDER — GLIPIZIDE ER 2.5 MG PO TB24: 2.5000 mg | ORAL_TABLET | Freq: Every day | ORAL | Status: DC

## 2012-02-20 MED ORDER — GLIPIZIDE 5 MG PO TABS: ORAL_TABLET | ORAL | Status: DC

## 2012-02-20 NOTE — Progress Notes (Signed)
  Subjective:    Patient ID: [name], female    DOB: 11/09/1934, 78 y.o.   MRN: [id number]  HPI  78 year old patient who is seen today for followup of type 2 diabetes. Last visit her hemoglobin A1c is 6.0 but the visit before 7.8. She describes occasional symptomatic hypoglycemia and does have some documented blood sugars in the 60s. At times she must take orange juice due to a hypoglycemic symptoms. She has treated hypertension which has been stable Unfortunately she continues to smoke    Review of Systems  Constitutional: Negative.   HENT: Negative for hearing loss, congestion, sore throat, rhinorrhea, dental problem, sinus pressure and tinnitus.   Eyes: Negative for pain, discharge and visual disturbance.  Respiratory: Negative for cough and shortness of breath.   Cardiovascular: Negative for chest pain, palpitations and leg swelling.  Gastrointestinal: Negative for nausea, vomiting, abdominal pain, diarrhea, constipation, blood in stool and abdominal distention.  Genitourinary: Negative for dysuria, urgency, frequency, hematuria, flank pain, vaginal bleeding, vaginal discharge, difficulty urinating, vaginal pain and pelvic pain.  Musculoskeletal: Negative for joint swelling, arthralgias and gait problem.  Skin: Negative for rash.  Neurological: Negative for dizziness, syncope, speech difficulty, weakness, numbness and headaches.  Hematological: Negative for adenopathy.  Psychiatric/Behavioral: Negative for behavioral problems, dysphoric mood and agitation. The patient is not nervous/anxious.        Objective:   Physical Exam  Constitutional: She is oriented to person, place, and time. She appears well-developed and well-nourished.  HENT:  Head: Normocephalic.  Right Ear: External ear normal.  Left Ear: External ear normal.  Mouth/Throat: Oropharynx is clear and moist.  Eyes: Conjunctivae and EOM are normal. Pupils are equal, round, and reactive to light.  Neck: Normal  range of motion. Neck supple. No thyromegaly present.  Cardiovascular: Normal rate, regular rhythm, normal heart sounds and intact distal pulses.   Pulmonary/Chest: Effort normal and breath sounds normal.  Abdominal: Soft. Bowel sounds are normal. She exhibits no mass. There is no tenderness.  Musculoskeletal: Normal range of motion.  Lymphadenopathy:    She has no cervical adenopathy.  Neurological: She is alert and oriented to person, place, and time.  Skin: Skin is warm and dry. No rash noted.  Psychiatric: She has a normal mood and affect. Her behavior is normal.          Assessment & Plan:   Diabetes mellitus. Will decrease glipizide to 2.5 mg daily. Continue metformin therapy Hypertension well controlled Ongoing tobacco use. Smoking cessation encouraged  Recheck 3 months

## 2012-02-20 NOTE — Patient Instructions (Signed)
Smoking tobacco is very bad for your health. You should stop smoking immediately.    It is important that you exercise regularly, at least 20 minutes 3 to 4 times per week.  If you develop chest pain or shortness of breath seek  medical attention.  Return in 3 months for follow-up  

## 2012-02-24 ENCOUNTER — Telehealth: Payer: Self-pay | Admitting: *Deleted

## 2012-02-24 NOTE — Telephone Encounter (Signed)
Please call asap re: mistake in medications on this pt and she is waiting.   Glipizide is the medication they have questions on.

## 2012-02-24 NOTE — Telephone Encounter (Signed)
Spoke with matt at [facility] - pt is on XL 2.5mg 

## 2012-04-02 ENCOUNTER — Encounter (HOSPITAL_COMMUNITY): Payer: Self-pay | Admitting: *Deleted

## 2012-04-02 ENCOUNTER — Telehealth: Payer: Self-pay | Admitting: *Deleted

## 2012-04-02 ENCOUNTER — Emergency Department (HOSPITAL_COMMUNITY): Payer: Medicare Other

## 2012-04-02 ENCOUNTER — Emergency Department (HOSPITAL_COMMUNITY)
Admission: EM | Admit: 2012-04-02 | Discharge: 2012-04-02 | Disposition: A | Discharge status: Home / Self Care | Payer: Medicare Other | Attending: Internal Medicine | Admitting: Internal Medicine

## 2012-04-02 DIAGNOSIS — E785 Hyperlipidemia, unspecified: Secondary | ICD-10-CM | POA: Insufficient documentation

## 2012-04-02 DIAGNOSIS — Z720 Tobacco use: Secondary | ICD-10-CM | POA: Diagnosis present

## 2012-04-02 DIAGNOSIS — E119 Type 2 diabetes mellitus without complications: Secondary | ICD-10-CM | POA: Insufficient documentation

## 2012-04-02 DIAGNOSIS — F17200 Nicotine dependence, unspecified, uncomplicated: Secondary | ICD-10-CM | POA: Diagnosis not present

## 2012-04-02 DIAGNOSIS — R0602 Shortness of breath: Secondary | ICD-10-CM | POA: Insufficient documentation

## 2012-04-02 DIAGNOSIS — IMO0001 Reserved for inherently not codable concepts without codable children: Secondary | ICD-10-CM

## 2012-04-02 DIAGNOSIS — J9819 Other pulmonary collapse: Secondary | ICD-10-CM | POA: Diagnosis not present

## 2012-04-02 DIAGNOSIS — E1165 Type 2 diabetes mellitus with hyperglycemia: Secondary | ICD-10-CM

## 2012-04-02 DIAGNOSIS — R079 Chest pain, unspecified: Secondary | ICD-10-CM | POA: Diagnosis present

## 2012-04-02 DIAGNOSIS — Z23 Encounter for immunization: Secondary | ICD-10-CM | POA: Diagnosis not present

## 2012-04-02 DIAGNOSIS — I10 Essential (primary) hypertension: Secondary | ICD-10-CM | POA: Insufficient documentation

## 2012-04-02 DIAGNOSIS — E782 Mixed hyperlipidemia: Secondary | ICD-10-CM

## 2012-04-02 DIAGNOSIS — R0789 Other chest pain: Secondary | ICD-10-CM | POA: Insufficient documentation

## 2012-04-02 LAB — CBC
Hemoglobin: 14.5 g/dL (ref 12.0–15.0)
MCH: 31.4 pg (ref 26.0–34.0)
MCHC: 34 g/dL (ref 30.0–36.0)
Platelets: 257 10*3/uL (ref 150–400)

## 2012-04-02 LAB — DIFFERENTIAL
Basophils Absolute: 0.1 10*3/uL (ref 0.0–0.1)
Basophils Relative: 1 % (ref 0–1)
Eosinophils Absolute: 0.1 10*3/uL (ref 0.0–0.7)
Monocytes Relative: 5 % (ref 3–12)
Neutro Abs: 5.9 10*3/uL (ref 1.7–7.7)
Neutrophils Relative %: 71 % (ref 43–77)

## 2012-04-02 LAB — COMPREHENSIVE METABOLIC PANEL
ALT: 15 U/L (ref 0–35)
AST: 22 U/L (ref 0–37)
Albumin: 3.4 g/dL — ABNORMAL LOW (ref 3.5–5.2)
Alkaline Phosphatase: 96 U/L (ref 39–117)
BUN: 13 mg/dL (ref 6–23)
Chloride: 104 mEq/L (ref 96–112)
Potassium: 4 mEq/L (ref 3.5–5.1)
Sodium: 140 mEq/L (ref 135–145)
Total Bilirubin: 0.3 mg/dL (ref 0.3–1.2)
Total Protein: 6.4 g/dL (ref 6.0–8.3)

## 2012-04-02 LAB — POCT I-STAT TROPONIN I: Troponin i, poc: 0 ng/mL (ref 0.00–0.08)

## 2012-04-02 MED ORDER — NITROGLYCERIN 0.4 MG/SPRAY TL SOLN: 1.0000 | Status: DC | PRN

## 2012-04-02 MED ORDER — ASPIRIN 81 MG PO CHEW
324.0000 mg | CHEWABLE_TABLET | Freq: Once | ORAL | Status: AC
  Administered 2012-04-02: 324 mg via ORAL
  Filled 2012-04-02: qty 4

## 2012-04-02 MED ORDER — PNEUMOCOCCAL VAC POLYVALENT 25 MCG/0.5ML IJ INJ
0.5000 mL | INJECTION | INTRAMUSCULAR | Status: AC
Start: 2012-04-03 — End: 2012-04-02
  Administered 2012-04-02: 0.5 mL via INTRAMUSCULAR
  Filled 2012-04-02: qty 0.5

## 2012-04-02 NOTE — Consult Note (Signed)
Hospital Admission Note Date: 04/02/2012  Patient name: [name] Medical record number: [id number] Date of birth: 09-Jul-1934 Age: 78 y.o. Gender: female PCP: Rogelia Boga, MD, MD  Attending physician: Maryruth Bun Relena Ivancic, MD Emergency Contact: Husband Mariane Duval [phone] Code Status:  Full Person requesting consult: Dr. Baldemar Friday  Chief Complaint: Chest pain.  History of Present Illness: [name] is an 78 y.o. female with PMH of DM, hypertension, and hyperlipidemia who presented to the hospital with a chief complaint of chest pain that lasted about 15 minutes.  She called her PCP about the pain and was advised to come to the ER for further evaluation.  Pain came on when she was washing dishes, rated a 5-6/10 at its worst.  Unable to characterize the pain as sharp or dull.  No history of similar pains.  Pain spontaneously went away on its own.  No shortness of breath but has had a dry cough.  No associated diaphoresis, nausea or radiation of the pain.  The patient does not want to stay over night and has not had any further episodes of chest pain, so we are asked to consult regarding her chest pain.  Past Medical History Past Medical History  Diagnosis Date  . Diabetes mellitus, type 2   . Hyperlipidemia   . Hypertension   . History of cervical cancer   . TN (trigeminal neuralgia)   . Raynaud's phenomenon     Past Surgical History Past Surgical History  Procedure Date  . Breast biopsy   . Breast enhancement surgery   . Total abdominal hysterectomy 1963    for cervical cancer in situ    Meds: Prior to Admission medications   Medication Sig Start Date End Date Taking? Authorizing Provider  aspirin EC 81 MG tablet Take 1 tablet (81 mg total) by mouth daily. 10/25/11 10/24/12 Yes Gordy Savers, MD  atorvastatin (LIPITOR) 20 MG tablet TAKE 1 TABLET BY MOUTH DAILY 10/14/11  Yes Gordy Savers, MD  BD Microtainer Lancets MISC by Does not apply  route. Use as directed    Yes Historical Provider, MD  glipiZIDE (GLUCOTROL XL) 2.5 MG 24 hr tablet Take 1 tablet (2.5 mg total) by mouth daily. 02/20/12 02/19/13 Yes Gordy Savers, MD  glucose blood (ONE TOUCH TEST STRIPS) test strip Use as instructed 04/09/11  Yes Gordy Savers, MD  lisinopril (PRINIVIL,ZESTRIL) 20 MG tablet Take 1 tablet (20 mg total) by mouth daily. 04/09/11 04/08/12 Yes Gordy Savers, MD  metFORMIN (GLUCOPHAGE) 1000 MG tablet Take 500 mg by mouth daily with breakfast.  07/23/11  Yes Gordy Savers, MD  Multiple Vitamins-Minerals (WOMENS 50+ MULTI VITAMIN/MIN PO) Take 1 tablet by mouth daily.   Yes Historical Provider, MD    Allergies: Review of patient's allergies indicates no known allergies.  Social History: History   Social History  . Marital Status: Married    Spouse Name: Rosanne Ashing    Number of Children: 1  . Years of Education: N/A   Occupational History  . Unemployed    Social History Main Topics  . Smoking status: Current Everyday Smoker -- 1.5 packs/day  . Smokeless tobacco: Never Used  . Alcohol Use: No  . Drug Use: No  . Sexually Active: Not on file   Other Topics Concern  . Not on file   Social History Narrative   Married.  Lives in [place] with her husband.  No FH of heart disease.    Family History:  No family  history on file.  Review of Systems: Constitutional: No fever or chills;  Appetite normal; No unintentional weight loss. +Fatigue HEENT: No blurry vision or diplopia, no pharyngitis or dysphagia CV: +chest pain, no arrhythmia.  Resp: No SOB, +cough. GI: No N/V, no diarrhea, no melena or hematochezia.  GU: No dysuria or hematuria.  MSK: no myalgias/arthralgias.  Neuro:  No headache or focal neurological deficits.  Psych: No depression or anxiety.  Endo: No thyroid disease, +DM.  Skin: No rashes or lesions.  Heme: No anemia or blood dyscrasia   Physical Exam: Blood pressure 183/66, pulse 67, temperature 98.3 F (36.8  C), temperature source Oral, resp. rate 21, SpO2 96.00%. BP 183/66  Pulse 67  Temp(Src) 98.3 F (36.8 C) (Oral)  Resp 21  SpO2 96%  General Appearance:    Alert, cooperative, no distress, appears stated age  Head:    Normocephalic, without obvious abnormality, atraumatic  Eyes:    PERRL, conjunctiva/corneas clear, EOM's intact, +arcus senilis  Ears:    Normal external ear canals, both ears  Nose:   Nares normal, septum midline, mucosa normal, no drainage    or sinus tenderness  Throat:   Lips, mucosa, and tongue normal; teeth and gums normal  Neck:   Supple, symmetrical, trachea midline, no adenopathy;    thyroid:  no enlargement/tenderness/nodules; no carotid   bruit or JVD  Back:     Symmetric, no curvature, ROM normal, no CVA tenderness  Lungs:     Clear to auscultation bilaterally, diminished in the bases.  Chest Wall:    No tenderness or deformity   Heart:    Regular rate and rhythm, S1 and S2 normal, no murmur, rub   or gallop  Abdomen:     Soft, non-tender, bowel sounds active all four quadrants,    no masses, no organomegaly  Extremities:   Extremities normal, atraumatic, no cyanosis or edema  Pulses:   2+ and symmetric all extremities  Skin:   Skin color, texture, turgor normal, no rashes or lesions  Lymph nodes:   Cervical, supraclavicular, and axillary nodes normal  Neurologic:   CNII-XII intact, normal strength, non-focal.   Lab results: Basic Metabolic Panel:  Lab 04/02/12 2952  NA 140  K 4.0  CL 104  CO2 26  GLUCOSE 302*  BUN 13  CREATININE 0.75  CALCIUM 9.0  MG --  PHOS --   GFR The CrCl is unknown because both a height and weight (above a minimum accepted value) are required for this calculation. Liver Function Tests:  Lab 04/02/12 1131  AST 22  ALT 15  ALKPHOS 96  BILITOT 0.3  PROT 6.4  ALBUMIN 3.4*   CBC:  Lab 04/02/12 1131  WBC 8.3  NEUTROABS 5.9  HGB 14.5  HCT 42.6  MCV 92.2  PLT 257   Imaging results:  Dg Chest 2  View  04/02/2012  *RADIOLOGY REPORT*  Clinical Data: Left-sided chest pain.  Shortness of breath.  Smoker with history of hypertension and diabetes.  CHEST - 2 VIEW 04/02/2012:  Comparison: Two-view chest x-ray 06/14/2009 [facility].  Findings: Cardiac silhouette upper normal in size for the AP technique, unchanged.  Thoracic aorta atherosclerotic, unchanged. Hilar and mediastinal contours otherwise unremarkable.  Suboptimal inspiration accounts for mild atelectasis in the left lower lobe. Lungs otherwise clear.  No pleural effusions.  Degenerative changes involving the thoracic spine, thoracic scoliosis convex right, and generalized osteopenia.  IMPRESSION: Suboptimal inspiration accounts for mild left basilar atelectasis. No acute cardiopulmonary  disease otherwise.  Original Report Authenticated By: Arnell Sieving, M.D.    Assessment & Plan: Principal Problem:  *Chest pain  Multiple cardiac risk factors including history of DM, hypertension, hyperlipidemia and tobacco abuse.  12 lead EKG: SINUS RHYTHM ~ normal P axis, V-rate 50- 99; LEFT ANTERIOR FASCICULAR BLOCK ~ axis(240,-40), init forces inf; EARLY PRECORDIAL R/S TRANSITION ~ QRS area positive in V2  Troponin 0.00 on point of care.  Patient declines inpatient evaluation.  Will d/c home with a prescription for NTG spay, advised to use if chest pain comes back. (Patient also advised that she needs to come back to the ER if her chest pain returns)  Will set up outpatient stress test. Active Problems:  DIABETES MELLITUS, TYPE II  Continue home meds, follow up with PCP.  HYPERLIPIDEMIA  Continue Lipitor.  HYPERTENSION  Continue Prinvil.  Tobacco abuse  Advised to quit.  Time Spent On Consultation: 45 minutes.  [name] 04/02/2012, 3:34 PM Pager [phone]

## 2012-04-02 NOTE — Discharge Instructions (Signed)
Chest Pain (Nonspecific) It is often hard to give a specific diagnosis for the cause of chest pain. There is always a chance that your pain could be related to something serious, such as a heart attack or a blood clot in the lungs. You need to follow up with your caregiver for further evaluation. CAUSES   Heartburn.   Pneumonia or bronchitis.   Anxiety or stress.   Inflammation around your heart (pericarditis) or lung (pleuritis or pleurisy).   A blood clot in the lung.   A collapsed lung (pneumothorax). It can develop suddenly on its own (spontaneous pneumothorax) or from injury (trauma) to the chest.   Shingles infection (herpes zoster virus).  The chest wall is composed of bones, muscles, and cartilage. Any of these can be the source of the pain.  The bones can be bruised by injury.   The muscles or cartilage can be strained by coughing or overwork.   The cartilage can be affected by inflammation and become sore (costochondritis).  DIAGNOSIS  Lab tests or other studies, such as X-rays, electrocardiography, stress testing, or cardiac imaging, may be needed to find the cause of your pain.  TREATMENT   Treatment depends on what may be causing your chest pain. Treatment may include:   Acid blockers for heartburn.   Anti-inflammatory medicine.   Pain medicine for inflammatory conditions.   Antibiotics if an infection is present.   You may be advised to change lifestyle habits. This includes stopping smoking and avoiding alcohol, caffeine, and chocolate.   You may be advised to keep your head raised (elevated) when sleeping. This reduces the chance of acid going backward from your stomach into your esophagus.   Most of the time, nonspecific chest pain will improve within 2 to 3 days with rest and mild pain medicine.  HOME CARE INSTRUCTIONS   If antibiotics were prescribed, take your antibiotics as directed. Finish them even if you start to feel better.   For the next few  days, avoid physical activities that bring on chest pain. Continue physical activities as directed.   Do not smoke.   Avoid drinking alcohol.   Only take over-the-counter or prescription medicine for pain, discomfort, or fever as directed by your caregiver.   Follow your caregiver's suggestions for further testing if your chest pain does not go away.   Keep any follow-up appointments you made. If you do not go to an appointment, you could develop lasting (chronic) problems with pain. If there is any problem keeping an appointment, you must call to reschedule.  SEEK MEDICAL CARE IF:   You think you are having problems from the medicine you are taking. Read your medicine instructions carefully.   Your chest pain does not go away, even after treatment.   You develop a rash with blisters on your chest.  SEEK IMMEDIATE MEDICAL CARE IF:   You have increased chest pain or pain that spreads to your arm, neck, jaw, back, or abdomen.   You develop shortness of breath, an increasing cough, or you are coughing up blood.   You have severe back or abdominal pain, feel nauseous, or vomit.   You develop severe weakness, fainting, or chills.   You have a fever.  THIS IS AN EMERGENCY. Do not wait to see if the pain will go away. Get medical help at once. Call your local emergency services (911 in [place] Do not drive yourself to the hospital. MAKE SURE YOU:   Understand these instructions.     Will watch your condition.   Will get help right away if you are not doing well or get worse.  Document Released: 08/21/2005 Document Revised: 10/31/2011 Document Reviewed: 06/16/2008 [employer] Patient Information 2012 [employer]. 

## 2012-04-02 NOTE — ED Notes (Signed)
Pt states she was washing dishes and started to have generalized chest pain. Pt states pain radiates across her chest. Pt also c/o sob. Pt states she smoke a cigarrett and pain became worst

## 2012-04-02 NOTE — ED Provider Notes (Signed)
History     CSN: [id number]  Arrival date & time 04/02/12  1103   First MD Initiated Contact with Patient 04/02/12 1119      Chief Complaint  Patient presents with  . Chest Pain    (Consider location/radiation/quality/duration/timing/severity/associated sxs/prior treatment) Patient is a 78 y.o. female presenting with chest pain. The history is provided by the patient.  Chest Pain The chest pain began 1 - 2 hours ago. Chest pain occurs constantly. The chest pain is improving. The severity of the pain is moderate. The quality of the pain is described as pressure-like. Primary symptoms include shortness of breath and cough. Pertinent negatives for primary symptoms include no fever, no wheezing, no palpitations, no abdominal pain, no nausea and no dizziness.  Pertinent negatives for associated symptoms include no numbness and no weakness.   Pt states she was washing dishes when began to have retrosternal tightness. States became short of breath as well. States symptoms lasted about 15 minutes and now as she says "about 95% gone." States she had one similar episode before, "a long time ago, dont remember when." Denies any heart problems. States she is a heavy smoker. No recent illnesses. Denies fever, chills, malaise. No nausea, dizziness, diaphoresis with episode today.  Past Medical History  Diagnosis Date  . Diabetes mellitus, type 2   . Hyperlipidemia   . Hypertension   . History of cervical cancer   . TN (trigeminal neuralgia)   . Raynaud's phenomenon     Past Surgical History  Procedure Date  . Breast biopsy   . Breast enhancement surgery   . Total abdominal hysterectomy 1963    for cervical cancer in situ    No family history on file.  History  Substance Use Topics  . Smoking status: Current Everyday Smoker -- 1.5 packs/day  . Smokeless tobacco: Never Used  . Alcohol Use: No    OB History    Grav Para Term Preterm Abortions TAB SAB Ect Mult Living                   Review of Systems  Constitutional: Negative for fever and chills.  Respiratory: Positive for cough, chest tightness and shortness of breath. Negative for wheezing.   Cardiovascular: Positive for chest pain. Negative for palpitations and leg swelling.  Gastrointestinal: Negative.  Negative for nausea and abdominal pain.  Genitourinary: Negative.   Musculoskeletal: Negative.   Skin: Negative.   Neurological: Negative for dizziness, weakness, numbness and headaches.    Allergies  Review of patient's allergies indicates no known allergies.  Home Medications   Current Outpatient Rx  Name Route Sig Dispense Refill  . ASPIRIN EC 81 MG PO TBEC Oral Take 1 tablet (81 mg total) by mouth daily. 150 tablet 2  . ATORVASTATIN CALCIUM 20 MG PO TABS  TAKE 1 TABLET BY MOUTH DAILY 90 tablet 2  . BD MICROTAINER LANCETS MISC Does not apply by Does not apply route. Use as directed     . GLIPIZIDE ER 2.5 MG PO TB24 Oral Take 1 tablet (2.5 mg total) by mouth daily. 90 tablet 3  . GLUCOSE BLOOD VI STRP  Use as instructed 100 each 6  . HYDROCODONE-HOMATROPINE 5-1.5 MG/5ML PO SYRP Oral Take 5 mLs by mouth every 6 (six) hours as needed. As needed for cough     . LISINOPRIL 20 MG PO TABS Oral Take 1 tablet (20 mg total) by mouth daily. 90 tablet 6  . METFORMIN HCL 1000 MG PO  TABS Oral Take 1,000 mg by mouth daily with breakfast.    . NEOMY-BACIT-POLYMYX-PRAMOXINE 1 % EX OINT Apply externally Apply topically 2 (two) times daily.        BP 179/62  Pulse 78  Temp(Src) 97.8 F (36.6 C) (Oral)  Resp 18  SpO2 96%  Physical Exam  Nursing note and vitals reviewed. Constitutional: She is oriented to person, place, and time. She appears well-developed and well-nourished. No distress.  HENT:  Head: Normocephalic.  Eyes: Conjunctivae are normal.  Neck: Neck supple.  Cardiovascular: Normal rate, regular rhythm, normal heart sounds and intact distal pulses.   Pulmonary/Chest: Effort normal. She has no  wheezes. She has no rales. She exhibits no tenderness.       Decreased breath sounds bilaterally  Abdominal: Soft. Bowel sounds are normal. She exhibits no distension. There is no tenderness.  Musculoskeletal: She exhibits no edema.  Neurological: She is alert and oriented to person, place, and time.  Skin: Skin is warm and dry.  Psychiatric: She has a normal mood and affect.    ED Course  Procedures (including critical care time)  11:45 AM Pt seen and examined. CP resolved. No cardiac hx. Multiple risk factors including smoking, diabites, HTN, elevated cholesterol. Will get labs, CXR. ASA ordered.    Date: 04/02/2012  Rate: 79  Rhythm: normal sinus rhythm  QRS Axis: left  Intervals: normal  ST/T Wave abnormalities: normal  Conduction Disutrbances:left anterior fascicular block  Narrative Interpretation:   Old EKG Reviewed: none available  Results for orders placed during the hospital encounter of 04/02/12  CBC      Component Value Range   WBC 8.3  4.0 - 10.5 (K/uL)   RBC 4.62  3.87 - 5.11 (MIL/uL)   Hemoglobin 14.5  12.0 - 15.0 (g/dL)   HCT 16.1  09.6 - 04.5 (%)   MCV 92.2  78.0 - 100.0 (fL)   MCH 31.4  26.0 - 34.0 (pg)   MCHC 34.0  30.0 - 36.0 (g/dL)   RDW 40.9  81.1 - 91.4 (%)   Platelets 257  150 - 400 (K/uL)  DIFFERENTIAL      Component Value Range   Neutrophils Relative 71  43 - 77 (%)   Neutro Abs 5.9  1.7 - 7.7 (K/uL)   Lymphocytes Relative 23  12 - 46 (%)   Lymphs Abs 1.9  0.7 - 4.0 (K/uL)   Monocytes Relative 5  3 - 12 (%)   Monocytes Absolute 0.4  0.1 - 1.0 (K/uL)   Eosinophils Relative 1  0 - 5 (%)   Eosinophils Absolute 0.1  0.0 - 0.7 (K/uL)   Basophils Relative 1  0 - 1 (%)   Basophils Absolute 0.1  0.0 - 0.1 (K/uL)  COMPREHENSIVE METABOLIC PANEL      Component Value Range   Sodium 140  135 - 145 (mEq/L)   Potassium 4.0  3.5 - 5.1 (mEq/L)   Chloride 104  96 - 112 (mEq/L)   CO2 26  19 - 32 (mEq/L)   Glucose, Bld 302 (*) 70 - 99 (mg/dL)   BUN 13  6  - 23 (mg/dL)   Creatinine, Ser 7.82  0.50 - 1.10 (mg/dL)   Calcium 9.0  8.4 - 95.6 (mg/dL)   Total Protein 6.4  6.0 - 8.3 (g/dL)   Albumin 3.4 (*) 3.5 - 5.2 (g/dL)   AST 22  0 - 37 (U/L)   ALT 15  0 - 35 (U/L)   Alkaline Phosphatase  96  39 - 117 (U/L)   Total Bilirubin 0.3  0.3 - 1.2 (mg/dL)   GFR calc non Af Amer 80 (*) >90 (mL/min)   GFR calc Af Amer >90  >90 (mL/min)  POCT I-STAT TROPONIN I      Component Value Range   Troponin i, poc 0.00  0.00 - 0.08 (ng/mL)   Comment 3            Dg Chest 2 View  04/02/2012  *RADIOLOGY REPORT*  Clinical Data: Left-sided chest pain.  Shortness of breath.  Smoker with history of hypertension and diabetes.  CHEST - 2 VIEW 04/02/2012:  Comparison: Two-view chest x-ray 06/14/2009 [facility].  Findings: Cardiac silhouette upper normal in size for the AP technique, unchanged.  Thoracic aorta atherosclerotic, unchanged. Hilar and mediastinal contours otherwise unremarkable.  Suboptimal inspiration accounts for mild atelectasis in the left lower lobe. Lungs otherwise clear.  No pleural effusions.  Degenerative changes involving the thoracic spine, thoracic scoliosis convex right, and generalized osteopenia.  IMPRESSION: Suboptimal inspiration accounts for mild left basilar atelectasis. No acute cardiopulmonary disease otherwise.  Original Report Authenticated By: Arnell Sieving, M.D.    3:00 PM Negative enzymes. Labs unremarkable. CXR unremarkable. PT has multiple risk factors for ACS with no prior work up. Will admit for rule out and further evaluation. VS normal other than elevated BP of 183/66. Pt states she took her morning meds.   Spoke with [facility] will admit.   1. Chest pain   2. Hypertension    MDM          Lottie Mussel, PA 04/02/12 [phone]

## 2012-04-02 NOTE — Telephone Encounter (Signed)
Pt called c/o mid chest pain for 15 minutes getting progressively worse pt is a 78 year smoker,diabetic . Could hear sob when talking with pt. Sent to [facility]. Told her to call 911 but she refused and states her sister will take her. Again asked to call 911 but she refused.FYI

## 2012-04-06 NOTE — ED Provider Notes (Signed)
Medical screening examination/treatment/procedure(s) were performed by non-physician practitioner and as supervising physician I was immediately available for consultation/collaboration.  Julies Carmickle, MD 04/06/12 1032 

## 2012-05-22 ENCOUNTER — Encounter: Payer: Self-pay | Admitting: Internal Medicine

## 2012-05-22 ENCOUNTER — Ambulatory Visit (INDEPENDENT_AMBULATORY_CARE_PROVIDER_SITE_OTHER): Payer: Medicare Other | Admitting: Internal Medicine

## 2012-05-22 VITALS — BP 120/74 | Temp 98.2°F | Wt 111.0 lb

## 2012-05-22 DIAGNOSIS — Z720 Tobacco use: Secondary | ICD-10-CM

## 2012-05-22 DIAGNOSIS — F17200 Nicotine dependence, unspecified, uncomplicated: Secondary | ICD-10-CM

## 2012-05-22 DIAGNOSIS — I10 Essential (primary) hypertension: Secondary | ICD-10-CM | POA: Diagnosis not present

## 2012-05-22 DIAGNOSIS — E785 Hyperlipidemia, unspecified: Secondary | ICD-10-CM | POA: Diagnosis not present

## 2012-05-22 DIAGNOSIS — E119 Type 2 diabetes mellitus without complications: Secondary | ICD-10-CM | POA: Diagnosis not present

## 2012-05-22 MED ORDER — METFORMIN HCL 500 MG PO TABS: 500.0000 mg | ORAL_TABLET | Freq: Two times a day (BID) | ORAL | Status: DC

## 2012-05-22 NOTE — Patient Instructions (Signed)
Limit your sodium (Salt) intake   Please check your hemoglobin A1c every 3 months    It is important that you exercise regularly, at least 20 minutes 3 to 4 times per week.  If you develop chest pain or shortness of breath seek  medical attention.   

## 2012-05-22 NOTE — Progress Notes (Signed)
  Subjective:    Patient ID: [name], female    DOB: 1934-06-29, 78 y.o.   MRN: [id number]  HPI  78 year old patient who is seen today for followup of her type 2 diabetes. She has had some low blood sugar readings and has discontinued metformin therapy. She remains on glipizide 2.5 mg daily. She continues to have occasional low reading but not symptomatic. She has dyslipidemia and also a history of hypertension. She continues to smoke. Since her last visit here she was evaluated in the ED for chest pain. This has not reoccurred. Her last hemoglobin 1C was less than 6    Review of Systems  Cardiovascular: Positive for chest pain.  Neurological: Positive for weakness.       Objective:   Physical Exam  Constitutional: She is oriented to person, place, and time. She appears well-developed and well-nourished.  HENT:  Head: Normocephalic.  Right Ear: External ear normal.  Left Ear: External ear normal.  Mouth/Throat: Oropharynx is clear and moist.  Eyes: Conjunctivae and EOM are normal. Pupils are equal, round, and reactive to light.  Neck: Normal range of motion. Neck supple. No thyromegaly present.  Cardiovascular: Normal rate, regular rhythm, normal heart sounds and intact distal pulses.   Pulmonary/Chest: Effort normal and breath sounds normal.  Abdominal: Soft. Bowel sounds are normal. She exhibits no mass. There is no tenderness.  Musculoskeletal: Normal range of motion.  Lymphadenopathy:    She has no cervical adenopathy.  Neurological: She is alert and oriented to person, place, and time.  Skin: Skin is warm and dry. No rash noted.  Psychiatric: She has a normal mood and affect. Her behavior is normal.          Assessment & Plan:   Diabetes mellitus. We'll discontinue glipizide and resume metformin therapy. We'll check a hemoglobin A1c Hypertension stable Dyslipidemia stable we'll continue atorvastatin. Chest pain has not reoccurred. Cessation of tobacco  encouraged. Will continue daily aspirin

## 2012-07-08 ENCOUNTER — Other Ambulatory Visit: Payer: Self-pay | Admitting: Internal Medicine

## 2012-07-09 ENCOUNTER — Other Ambulatory Visit: Payer: Self-pay

## 2012-07-09 MED ORDER — GLUCOSE BLOOD VI STRP: 1.0000 | ORAL_STRIP | Freq: Every day | Status: DC

## 2012-07-09 MED ORDER — LISINOPRIL 20 MG PO TABS: 20.0000 mg | ORAL_TABLET | Freq: Every day | ORAL | Status: DC

## 2012-07-09 MED ORDER — ATORVASTATIN CALCIUM 20 MG PO TABS: 20.0000 mg | ORAL_TABLET | Freq: Every day | ORAL | Status: DC

## 2012-08-06 DIAGNOSIS — Z23 Encounter for immunization: Secondary | ICD-10-CM | POA: Diagnosis not present

## 2012-08-10 DIAGNOSIS — H25019 Cortical age-related cataract, unspecified eye: Secondary | ICD-10-CM | POA: Diagnosis not present

## 2012-09-29 ENCOUNTER — Ambulatory Visit (INDEPENDENT_AMBULATORY_CARE_PROVIDER_SITE_OTHER): Payer: Medicare Other | Admitting: Internal Medicine

## 2012-09-29 ENCOUNTER — Encounter: Payer: Self-pay | Admitting: Internal Medicine

## 2012-09-29 VITALS — BP 104/74 | Temp 97.9°F | Wt 110.0 lb

## 2012-09-29 DIAGNOSIS — I10 Essential (primary) hypertension: Secondary | ICD-10-CM | POA: Diagnosis not present

## 2012-09-29 DIAGNOSIS — E785 Hyperlipidemia, unspecified: Secondary | ICD-10-CM | POA: Diagnosis not present

## 2012-09-29 DIAGNOSIS — E119 Type 2 diabetes mellitus without complications: Secondary | ICD-10-CM

## 2012-09-29 LAB — HEMOGLOBIN A1C: Hgb A1c MFr Bld: 7.5 % — ABNORMAL HIGH (ref 4.6–6.5)

## 2012-09-29 MED ORDER — METFORMIN HCL 500 MG PO TABS: 500.0000 mg | ORAL_TABLET | Freq: Two times a day (BID) | ORAL | Status: DC

## 2012-09-29 MED ORDER — LISINOPRIL 20 MG PO TABS: 20.0000 mg | ORAL_TABLET | Freq: Every day | ORAL | Status: DC

## 2012-09-29 MED ORDER — ATORVASTATIN CALCIUM 20 MG PO TABS: 20.0000 mg | ORAL_TABLET | Freq: Every day | ORAL | Status: DC

## 2012-09-29 MED ORDER — GLUCOSE BLOOD VI STRP: 1.0000 | ORAL_STRIP | Freq: Every day | Status: DC

## 2012-09-29 NOTE — Progress Notes (Signed)
Subjective:    Patient ID: [name], female    DOB: 10-03-1934, 78 y.o.   MRN: [id number]  HPI  78 year old patient who is seen today for followup of type 2 diabetes. She maintains for a nice glycemic control. Hemoglobin A1c's have been 6.4 and 6.0 last 2 determinations. Fasting blood sugars remain essentially normal. Blood pressure also remains in a low-normal range. She has a history of dyslipidemia controlled on atorvastatin. Unfortunately she continues to smoke. No cardiopulmonary complaints. She does require medication refills.  Past Medical History  Diagnosis Date  . Diabetes mellitus, type 2   . Hyperlipidemia   . Hypertension   . History of cervical cancer   . TN (trigeminal neuralgia)   . Raynaud's phenomenon     History   Social History  . Marital Status: Married    Spouse Name: Rosanne Ashing    Number of Children: 1  . Years of Education: N/A   Occupational History  . Unemployed    Social History Main Topics  . Smoking status: Current Every Day Smoker -- 1.5 packs/day  . Smokeless tobacco: Never Used  . Alcohol Use: No  . Drug Use: No  . Sexually Active: Not on file   Other Topics Concern  . Not on file   Social History Narrative   Married.  Lives in [place] with her husband.  No FH of heart disease.    Past Surgical History  Procedure Date  . Breast biopsy   . Breast enhancement surgery   . Total abdominal hysterectomy 1963    for cervical cancer in situ    No family history on file.  No Known Allergies  Current Outpatient Prescriptions on File Prior to Visit  Medication Sig Dispense Refill  . aspirin EC 81 MG tablet Take 1 tablet (81 mg total) by mouth daily.  150 tablet  2  . atorvastatin (LIPITOR) 20 MG tablet Take 1 tablet (20 mg total) by mouth daily.  90 tablet  3  . BD Microtainer Lancets MISC by Does not apply route. Use as directed       . glucose blood (ONE TOUCH TEST STRIPS) test strip 1 each by Other route daily. Use as instructed  100  each  6  . lisinopril (PRINIVIL,ZESTRIL) 20 MG tablet Take 1 tablet (20 mg total) by mouth daily.  90 tablet  3  . metFORMIN (GLUCOPHAGE) 500 MG tablet Take 1 tablet (500 mg total) by mouth 2 (two) times daily with a meal.  180 tablet  3  . Multiple Vitamins-Minerals (WOMENS 50+ MULTI VITAMIN/MIN PO) Take 1 tablet by mouth daily.      . nitroGLYCERIN (NITROLINGUAL) 0.4 MG/SPRAY spray Place 1 spray under the tongue every 5 (five) minutes as needed for chest pain.  12 g  12    BP 104/74  Temp 97.9 F (36.6 C) (Oral)  Wt 110 lb (49.896 kg)       Review of Systems  Constitutional: Negative.   HENT: Negative for hearing loss, congestion, sore throat, rhinorrhea, dental problem, sinus pressure and tinnitus.   Eyes: Negative for pain, discharge and visual disturbance.  Respiratory: Negative for cough and shortness of breath.   Cardiovascular: Negative for chest pain, palpitations and leg swelling.  Gastrointestinal: Negative for nausea, vomiting, abdominal pain, diarrhea, constipation, blood in stool and abdominal distention.  Genitourinary: Negative for dysuria, urgency, frequency, hematuria, flank pain, vaginal bleeding, vaginal discharge, difficulty urinating, vaginal pain and pelvic pain.  Musculoskeletal: Negative for joint swelling, arthralgias  and gait problem.  Skin: Negative for rash.  Neurological: Negative for dizziness, syncope, speech difficulty, weakness, numbness and headaches.  Hematological: Negative for adenopathy.  Psychiatric/Behavioral: Negative for behavioral problems, dysphoric mood and agitation. The patient is not nervous/anxious.        Objective:   Physical Exam  Constitutional: She is oriented to person, place, and time. She appears well-developed and well-nourished.  HENT:  Head: Normocephalic.  Right Ear: External ear normal.  Left Ear: External ear normal.  Mouth/Throat: Oropharynx is clear and moist.  Eyes: Conjunctivae normal and EOM are normal.  Pupils are equal, round, and reactive to light.  Neck: Normal range of motion. Neck supple. No thyromegaly present.  Cardiovascular: Normal rate, regular rhythm, normal heart sounds and intact distal pulses.   Pulmonary/Chest: Effort normal and breath sounds normal.  Abdominal: Soft. Bowel sounds are normal. She exhibits no mass. There is no tenderness.  Musculoskeletal: Normal range of motion.  Lymphadenopathy:    She has no cervical adenopathy.  Neurological: She is alert and oriented to person, place, and time.  Skin: Skin is warm and dry. No rash noted.  Psychiatric: She has a normal mood and affect. Her behavior is normal.          Assessment & Plan:   Diabetes mellitus type 2. Will check a hemoglobin A1c Dyslipidemia. We'll continue atorvastatin 20 mg daily. Check lipid profile at the time of her annual exam in 4 months Hypertension well controlled  Tobacco abuse. Total cessation of smoking encouraged

## 2012-09-29 NOTE — Patient Instructions (Signed)
Please check your hemoglobin A1c every 3 months  Limit your sodium (Salt) intake    It is important that you exercise regularly, at least 20 minutes 3 to 4 times per week.  If you develop chest pain or shortness of breath seek  medical attention.  Smoking tobacco is very bad for your health. You should stop smoking immediately. 

## 2012-12-08 ENCOUNTER — Encounter (INDEPENDENT_AMBULATORY_CARE_PROVIDER_SITE_OTHER): Payer: Medicare Other

## 2012-12-08 DIAGNOSIS — I6529 Occlusion and stenosis of unspecified carotid artery: Secondary | ICD-10-CM | POA: Diagnosis not present

## 2012-12-13 ENCOUNTER — Encounter: Payer: Self-pay | Admitting: Internal Medicine

## 2012-12-13 DIAGNOSIS — I739 Peripheral vascular disease, unspecified: Secondary | ICD-10-CM

## 2013-01-05 ENCOUNTER — Encounter: Payer: Self-pay | Admitting: Internal Medicine

## 2013-01-05 ENCOUNTER — Ambulatory Visit (INDEPENDENT_AMBULATORY_CARE_PROVIDER_SITE_OTHER): Payer: Medicare Other | Admitting: Internal Medicine

## 2013-01-05 VITALS — BP 120/70 | HR 76 | Temp 98.4°F | Resp 16 | Wt 110.0 lb

## 2013-01-05 DIAGNOSIS — I10 Essential (primary) hypertension: Secondary | ICD-10-CM | POA: Diagnosis not present

## 2013-01-05 DIAGNOSIS — E119 Type 2 diabetes mellitus without complications: Secondary | ICD-10-CM | POA: Diagnosis not present

## 2013-01-05 DIAGNOSIS — K120 Recurrent oral aphthae: Secondary | ICD-10-CM | POA: Diagnosis not present

## 2013-01-05 MED ORDER — LIDOCAINE 5 % EX OINT: TOPICAL_OINTMENT | CUTANEOUS | Status: DC | PRN

## 2013-01-05 MED ORDER — BENZOCAINE 20 % MT GEL: 1.0000 "application " | Freq: Four times a day (QID) | OROMUCOSAL | Status: DC | PRN

## 2013-01-05 NOTE — Progress Notes (Signed)
Subjective:    Patient ID: [name], female    DOB: [birth year], 79 y.o.   MRN: [id number]  HPI  79 year old patient who presents with a three-week history of a painful ulcer involving the oral cavity. She has type 2 diabetes. Last hemoglobin A1c had increased to 7.5. She remains on metformin 500 mg twice daily only. She states that her glycemic control has been good. She has hypertension.  Past Medical History  Diagnosis Date  . Diabetes mellitus, type 2   . Hyperlipidemia   . Hypertension   . History of cervical cancer   . TN (trigeminal neuralgia)   . Raynaud's phenomenon     History   Social History  . Marital Status: Married    Spouse Name: Rosanne Ashing    Number of Children: 1  . Years of Education: N/A   Occupational History  . Unemployed    Social History Main Topics  . Smoking status: Current Every Day Smoker -- 1.50 packs/day  . Smokeless tobacco: Never Used  . Alcohol Use: No  . Drug Use: No  . Sexually Active: Not on file   Other Topics Concern  . Not on file   Social History Narrative   Married.  Lives in [place] with her husband.  No FH of heart disease.    Past Surgical History  Procedure Laterality Date  . Breast biopsy    . Breast enhancement surgery    . Total abdominal hysterectomy  1963    for cervical cancer in situ    History reviewed. No pertinent family history.  No Known Allergies  Current Outpatient Prescriptions on File Prior to Visit  Medication Sig Dispense Refill  . atorvastatin (LIPITOR) 20 MG tablet Take 1 tablet (20 mg total) by mouth daily.  90 tablet  3  . BD Microtainer Lancets MISC by Does not apply route. Use as directed       . glucose blood (ONE TOUCH TEST STRIPS) test strip 1 each by Other route daily. Use as instructed  100 each  6  . lisinopril (PRINIVIL,ZESTRIL) 20 MG tablet Take 1 tablet (20 mg total) by mouth daily.  90 tablet  3  . metFORMIN (GLUCOPHAGE) 500 MG tablet Take 1 tablet (500 mg total) by mouth 2  (two) times daily with a meal.  180 tablet  3  . Multiple Vitamins-Minerals (WOMENS 50+ MULTI VITAMIN/MIN PO) Take 1 tablet by mouth daily.      . nitroGLYCERIN (NITROLINGUAL) 0.4 MG/SPRAY spray Place 1 spray under the tongue every 5 (five) minutes as needed for chest pain.  12 g  12   No current facility-administered medications on file prior to visit.    BP 120/70  Pulse 76  Temp(Src) 98.4 F (36.9 C) (Oral)  Resp 16  Wt 110 lb (49.896 kg)  BMI 23.8 kg/m2       Review of Systems  Constitutional: Negative.   HENT: Negative for hearing loss, congestion, sore throat, rhinorrhea, dental problem, sinus pressure and tinnitus.   Eyes: Negative for pain, discharge and visual disturbance.  Respiratory: Negative for cough and shortness of breath.   Cardiovascular: Negative for chest pain, palpitations and leg swelling.  Gastrointestinal: Negative for nausea, vomiting, abdominal pain, diarrhea, constipation, blood in stool and abdominal distention.  Genitourinary: Negative for dysuria, urgency, frequency, hematuria, flank pain, vaginal bleeding, vaginal discharge, difficulty urinating, vaginal pain and pelvic pain.  Musculoskeletal: Negative for joint swelling, arthralgias and gait problem.  Skin: Positive for wound. Negative for rash.  Neurological: Negative for dizziness, syncope, speech difficulty, weakness, numbness and headaches.  Hematological: Negative for adenopathy.  Psychiatric/Behavioral: Negative for behavioral problems, dysphoric mood and agitation. The patient is not nervous/anxious.        Objective:   Physical Exam  Constitutional: She appears well-developed and well-nourished. No distress.  HENT:  An approximate 5 mm white ulcer noted involving the junction of the gingival and buccal mucosa just to the right of the midline No regional adenopathy          Assessment & Plan:   Mouth ulcer. Will treat with the [place] and topical anesthetics Diabetes. We'll  check a hemoglobin A1c Hypertension stable

## 2013-01-05 NOTE — Patient Instructions (Signed)
Please check your hemoglobin A1c every 3 months  Return in one month  For  followup

## 2013-01-23 ENCOUNTER — Emergency Department (HOSPITAL_COMMUNITY)
Admission: EM | Admit: 2013-01-23 | Discharge: 2013-01-23 | Disposition: A | Discharge status: Home / Self Care | Payer: Medicare Other | Attending: Emergency Medicine | Admitting: Emergency Medicine

## 2013-01-23 ENCOUNTER — Encounter (HOSPITAL_COMMUNITY): Payer: Self-pay | Admitting: Emergency Medicine

## 2013-01-23 DIAGNOSIS — Z79899 Other long term (current) drug therapy: Secondary | ICD-10-CM | POA: Diagnosis not present

## 2013-01-23 DIAGNOSIS — R51 Headache: Secondary | ICD-10-CM | POA: Diagnosis not present

## 2013-01-23 DIAGNOSIS — Z8541 Personal history of malignant neoplasm of cervix uteri: Secondary | ICD-10-CM | POA: Diagnosis not present

## 2013-01-23 DIAGNOSIS — Z8669 Personal history of other diseases of the nervous system and sense organs: Secondary | ICD-10-CM | POA: Insufficient documentation

## 2013-01-23 DIAGNOSIS — L03211 Cellulitis of face: Secondary | ICD-10-CM | POA: Insufficient documentation

## 2013-01-23 DIAGNOSIS — I10 Essential (primary) hypertension: Secondary | ICD-10-CM | POA: Insufficient documentation

## 2013-01-23 DIAGNOSIS — K122 Cellulitis and abscess of mouth: Secondary | ICD-10-CM | POA: Diagnosis not present

## 2013-01-23 DIAGNOSIS — F17200 Nicotine dependence, unspecified, uncomplicated: Secondary | ICD-10-CM | POA: Diagnosis not present

## 2013-01-23 DIAGNOSIS — Z7982 Long term (current) use of aspirin: Secondary | ICD-10-CM | POA: Diagnosis not present

## 2013-01-23 DIAGNOSIS — E785 Hyperlipidemia, unspecified: Secondary | ICD-10-CM | POA: Insufficient documentation

## 2013-01-23 DIAGNOSIS — E119 Type 2 diabetes mellitus without complications: Secondary | ICD-10-CM | POA: Diagnosis not present

## 2013-01-23 DIAGNOSIS — L0201 Cutaneous abscess of face: Secondary | ICD-10-CM | POA: Diagnosis not present

## 2013-01-23 DIAGNOSIS — Z8679 Personal history of other diseases of the circulatory system: Secondary | ICD-10-CM | POA: Insufficient documentation

## 2013-01-23 LAB — GLUCOSE, CAPILLARY: Glucose-Capillary: 135 mg/dL — ABNORMAL HIGH (ref 70–99)

## 2013-01-23 MED ORDER — OXYCODONE-ACETAMINOPHEN 5-325 MG PO TABS: 1.0000 | ORAL_TABLET | ORAL | Status: DC | PRN

## 2013-01-23 MED ORDER — LIDOCAINE-EPINEPHRINE 2 %-1:100000 IJ SOLN
20.0000 mL | Freq: Once | INTRAMUSCULAR | Status: AC
  Administered 2013-01-23: 20 mL

## 2013-01-23 MED ORDER — CLINDAMYCIN HCL 150 MG PO CAPS: 300.0000 mg | ORAL_CAPSULE | Freq: Four times a day (QID) | ORAL | Status: DC

## 2013-01-23 NOTE — ED Provider Notes (Signed)
History    79 year old female with facial pain. Atraumatic. Gradual onset about 3-4 days ago. Pain is in the right anterior chin and radiates along her right jaw and into right ear. No fever or chills. No neck pain or swelling. No difficulty breathing or swallowing. No n/v.    CSN: [id number]  Arrival date & time 01/23/13  1051   First MD Initiated Contact with Patient 01/23/13 1106      Chief Complaint  Patient presents with  . Mouth Lesions    (Consider location/radiation/quality/duration/timing/severity/associated sxs/prior treatment) HPI  Past Medical History  Diagnosis Date  . Diabetes mellitus, type 2   . Hyperlipidemia   . Hypertension   . History of cervical cancer   . TN (trigeminal neuralgia)   . Raynaud's phenomenon     Past Surgical History  Procedure Laterality Date  . Breast biopsy    . Breast enhancement surgery    . Total abdominal hysterectomy  1963    for cervical cancer in situ    No family history on file.  History  Substance Use Topics  . Smoking status: Current Every Day Smoker -- 1.50 packs/day  . Smokeless tobacco: Never Used  . Alcohol Use: No    OB History   Grav Para Term Preterm Abortions TAB SAB Ect Mult Living                  Review of Systems  All systems reviewed and negative, other than as noted in HPI.   Allergies  Review of patient's allergies indicates no known allergies.  Home Medications   Current Outpatient Rx  Name  Route  Sig  Dispense  Refill  . aspirin EC 81 MG tablet   Oral   Take 81 mg by mouth at bedtime.         [name] atorvastatin (LIPITOR) 20 MG tablet   Oral   Take 20 mg by mouth every morning.         [name] lisinopril (PRINIVIL,ZESTRIL) 20 MG tablet   Oral   Take 20 mg by mouth every morning.         . metFORMIN (GLUCOPHAGE) 500 MG tablet   Oral   Take 1 tablet (500 mg total) by mouth 2 (two) times daily with a meal.   180 tablet   3   . Multiple Vitamins-Minerals (WOMENS 50+ MULTI  VITAMIN/MIN PO)   Oral   Take 1 tablet by mouth every morning.          . clindamycin (CLEOCIN) 150 MG capsule   Oral   Take 2 capsules (300 mg total) by mouth 4 (four) times daily.   28 capsule   0   . oxyCODONE-acetaminophen (PERCOCET/ROXICET) 5-325 MG per tablet   Oral   Take 1-2 tablets by mouth every 4 (four) hours as needed for pain.   15 tablet   0     BP 158/69  Pulse 88  Temp(Src) 98.4 F (36.9 C) (Oral)  Resp 16  Ht 5' (1.524 m)  Wt 106 lb (48.081 kg)  BMI 20.7 kg/m2  SpO2 99%  Physical Exam  Nursing note and vitals reviewed. Constitutional: She appears well-developed and well-nourished. No distress.  HENT:  Head: Normocephalic.  Small tender, fluctuant area to anterior chin. Hard white plaque along buccogingival fold mandible in area of where R lateral incisor/canine would be. When pressing on the fluctuant area pus visualized coming from area just adjacent to this plaque.   Eyes: Conjunctivae are  normal. Right eye exhibits no discharge. Left eye exhibits no discharge.  Neck: Neck supple.  Cardiovascular: Normal rate, regular rhythm and normal heart sounds.  Exam reveals no gallop and no friction rub.   No murmur heard. Pulmonary/Chest: Effort normal and breath sounds normal. No respiratory distress.  Abdominal: Soft. She exhibits no distension. There is no tenderness.  Musculoskeletal: She exhibits no edema and no tenderness.  Neurological: She is alert.  Skin: Skin is warm and dry.  Psychiatric: She has a normal mood and affect. Her behavior is normal. Thought content normal.    ED Course  Procedures (including critical care time)  INCISION AND DRAINAGE Performed by: Raeford Razor Consent: Verbal consent obtained. Risks and benefits: risks, benefits and alternatives were discussed Type: abscess  Body area: mouth  Anesthesia: local infiltration  Incision was made with a scalpel.  Local anesthetic: lidocaine 2% 2epinephrine  Anesthetic  total: 2 ml  Complexity: complex Blunt dissection to break up loculations  Drainage: purulent  Drainage amount: small Packing material: none  Patient tolerance: Patient tolerated the procedure well with no immediate complications.     Labs Reviewed  GLUCOSE, CAPILLARY - Abnormal; Notable for the following:    Glucose-Capillary 135 (*)    All other components within normal limits   No results found.   1. Facial abscess       MDM  79yF with facial pain. Exam consistent with a facial abscess. Suspect having irritation from denture plate which subsequently became infected. Submental tissues soft, no tongue elevation or other findings concerning for serious head/neck infection. Incised and drained. Course of clindamycin. PRN pain meds. Salt water rinses. Emergent return precautions discussed.         Raeford Razor, MD 01/26/13 [id number]

## 2013-01-23 NOTE — ED Notes (Signed)
She c/o soreness in her mouth; plus and occasional "sharp" pain radiating into her right ear for a couple of days.  She is in no distress. She has a visible superative, purulent lesion at lower jaw area just to right of midline. She also c/o a "sore" area on right scalp area just superior to right ear x 2 months with no visible swelling, lesion or discoloration.

## 2013-01-26 ENCOUNTER — Encounter: Payer: Self-pay | Admitting: Internal Medicine

## 2013-01-26 ENCOUNTER — Ambulatory Visit (INDEPENDENT_AMBULATORY_CARE_PROVIDER_SITE_OTHER): Payer: Medicare Other | Admitting: Internal Medicine

## 2013-01-26 ENCOUNTER — Encounter: Payer: Medicare Other | Admitting: Internal Medicine

## 2013-01-26 VITALS — BP 120/80 | HR 60 | Temp 97.5°F | Resp 18 | Ht <= 58 in | Wt 106.0 lb

## 2013-01-26 DIAGNOSIS — E119 Type 2 diabetes mellitus without complications: Secondary | ICD-10-CM

## 2013-01-26 DIAGNOSIS — E785 Hyperlipidemia, unspecified: Secondary | ICD-10-CM | POA: Diagnosis not present

## 2013-01-26 DIAGNOSIS — Z720 Tobacco use: Secondary | ICD-10-CM

## 2013-01-26 DIAGNOSIS — I10 Essential (primary) hypertension: Secondary | ICD-10-CM

## 2013-01-26 DIAGNOSIS — Z0000 Encounter for general adult medical examination without abnormal findings: Secondary | ICD-10-CM | POA: Diagnosis not present

## 2013-01-26 DIAGNOSIS — I779 Disorder of arteries and arterioles, unspecified: Secondary | ICD-10-CM

## 2013-01-26 DIAGNOSIS — F17200 Nicotine dependence, unspecified, uncomplicated: Secondary | ICD-10-CM

## 2013-01-26 LAB — CBC WITH DIFFERENTIAL/PLATELET
Basophils Absolute: 0.1 K/uL (ref 0.0–0.1)
Basophils Relative: 0.8 % (ref 0.0–3.0)
Eosinophils Absolute: 0.1 K/uL (ref 0.0–0.7)
Eosinophils Relative: 1.3 % (ref 0.0–5.0)
HCT: 46.3 % — ABNORMAL HIGH (ref 36.0–46.0)
Hemoglobin: 15.7 g/dL — ABNORMAL HIGH (ref 12.0–15.0)
Lymphocytes Relative: 28.9 % (ref 12.0–46.0)
Lymphs Abs: 2.6 K/uL (ref 0.7–4.0)
MCHC: 33.9 g/dL (ref 30.0–36.0)
MCV: 91.8 fl (ref 78.0–100.0)
Monocytes Absolute: 0.4 K/uL (ref 0.1–1.0)
Monocytes Relative: 4.4 % (ref 3.0–12.0)
Neutro Abs: 5.8 K/uL (ref 1.4–7.7)
Neutrophils Relative %: 64.6 % (ref 43.0–77.0)
Platelets: 286 K/uL (ref 150.0–400.0)
RBC: 5.05 Mil/uL (ref 3.87–5.11)
RDW: 13.6 % (ref 11.5–14.6)
WBC: 8.9 K/uL (ref 4.5–10.5)

## 2013-01-26 LAB — COMPREHENSIVE METABOLIC PANEL WITH GFR
ALT: 16 U/L (ref 0–35)
AST: 20 U/L (ref 0–37)
Albumin: 4.1 g/dL (ref 3.5–5.2)
Alkaline Phosphatase: 75 U/L (ref 39–117)
BUN: 14 mg/dL (ref 6–23)
CO2: 27 meq/L (ref 19–32)
Calcium: 9.9 mg/dL (ref 8.4–10.5)
Chloride: 106 meq/L (ref 96–112)
Creatinine, Ser: 0.7 mg/dL (ref 0.4–1.2)
GFR: 80.88 mL/min
Glucose, Bld: 85 mg/dL (ref 70–99)
Potassium: 3.7 meq/L (ref 3.5–5.1)
Sodium: 144 meq/L (ref 135–145)
Total Bilirubin: 0.6 mg/dL (ref 0.3–1.2)
Total Protein: 7.7 g/dL (ref 6.0–8.3)

## 2013-01-26 LAB — MICROALBUMIN / CREATININE URINE RATIO: Microalb Creat Ratio: 1.7 mg/g (ref 0.0–30.0)

## 2013-01-26 LAB — LIPID PANEL
Cholesterol: 203 mg/dL — ABNORMAL HIGH (ref 0–200)
HDL: 50.4 mg/dL (ref >39.00)
Total CHOL/HDL Ratio: 4
VLDL: 49.2 mg/dL — ABNORMAL HIGH (ref 0.0–40.0)

## 2013-01-26 LAB — TSH: TSH: 0.5 u[IU]/mL (ref 0.35–5.50)

## 2013-01-26 LAB — LDL CHOLESTEROL, DIRECT: Direct LDL: 116.4 mg/dL

## 2013-01-26 MED ORDER — ATORVASTATIN CALCIUM 20 MG PO TABS: 20.0000 mg | ORAL_TABLET | Freq: Every morning | ORAL | Status: DC

## 2013-01-26 MED ORDER — METFORMIN HCL 500 MG PO TABS: 500.0000 mg | ORAL_TABLET | Freq: Two times a day (BID) | ORAL | Status: DC

## 2013-01-26 MED ORDER — LISINOPRIL 20 MG PO TABS: 20.0000 mg | ORAL_TABLET | Freq: Every morning | ORAL | Status: DC

## 2013-01-26 NOTE — Progress Notes (Signed)
Patient ID: [name], female   DOB: 1934/10/17, 79 y.o.   MRN: [id number]  Subjective:    Patient ID: [name], female    DOB: [birth year], 79 y.o.   MRN: [id number]  Diabetes Pertinent negatives for hypoglycemia include no dizziness, headaches, nervousness/anxiousness or speech difficulty. Pertinent negatives for diabetes include no chest pain, no fatigue and no weakness.  4 -year-old patient who is seen today for a health maintenance exam. She has a history of type 2 diabetes and she has down titrated her metformin due to mild hypoglycemia. Blood pressure regimen includes combination ACE inhibition and diuretic therapy. She denies any cardiopulmonary complaints unfortunately she continues to smoke. She was evaluated in ED recently due to a abscess involving the buccal mucosa. This was I&D and she is completing antibiotic therapy. This he continues to tolerate well. She has had no diarrhea. Medical problems include carotid artery stenosis. Follow carotid artery Doppler study was performed 2 months ago that revealed 60-79% left ICA stenosis. Denies any focal neurological symptoms  1. Risk factors, based on past  M,S,F history- cardiovascular risk factors include hypertension dyslipidemia diabetes and ongoing tobacco use; she has peripheral vascular disease with left ICA stenosis  2.  Physical activities: Remains quite active goes to the health club frequently  3.  Depression/mood: No history depression or mood disorder  4.  Hearing: No deficits  5.  ADL's: Independent in all aspects of daily living 6.  Fall risk: Low  7.  Home safety: No problems identified  8.  Height weight, and visual acuity; height and weight stable no change in visual acuity  9.  Counseling: Heart healthy diet restricted salt diet also encouraged regular exercise recommended  10. Lab orders based on risk factors: Laboratory profile including lipid panel will be reviewed  11. Referral : None appropriate  at this time  12. Care plan: Heart healthy diet regular exercise encouraged  13. Cognitive assessment: Alert and appropriate normal affect no cognitive dysfunction  Past Medical History  Diagnosis Date  . Diabetes mellitus, type 2   . Hyperlipidemia   . Hypertension   . History of cervical cancer   . TN (trigeminal neuralgia)   . Raynaud's phenomenon    Past Surgical History  Procedure Laterality Date  . Breast biopsy    . Breast enhancement surgery    . Total abdominal hysterectomy  1963    for cervical cancer in situ    reports that she has been smoking.  She has never used smokeless tobacco. She reports that she does not drink alcohol or use illicit drugs. family history is not on file. No Known Allergies    Alcohol-Tobacco  Smoking Status: current   Allergies (verified):  No Known Drug Allergies   Past History:  Past Medical History:  Reviewed history from 11/30/2009 and no changes required.  Diabetes mellitus, type II  Hyperlipidemia  Hypertension  Cervical cancer, hx of  trigeminal neuralgia  Raynaud's phenomenon  Left carotid artery stenosis Tobacco abuse   Family History:  both parents died in their 30s natural causes, details unclear  6 brothers 6 sisters - ONLY ONE DECEASED AT 67 denies any family history of diabetes, cancer or premature heart disease     Review of Systems  Constitutional: Negative for fever, appetite change, fatigue and unexpected weight change.  HENT: Negative for hearing loss, ear pain, nosebleeds, congestion, sore throat, mouth sores, trouble swallowing, neck stiffness, dental problem, voice change, sinus pressure and tinnitus.   Eyes:  Negative for photophobia, pain, redness and visual disturbance.  Respiratory: Negative for cough, chest tightness and shortness of breath.   Cardiovascular: Negative for chest pain, palpitations and leg swelling.  Gastrointestinal: Negative for nausea, vomiting, abdominal pain, diarrhea,  constipation, blood in stool, abdominal distention and rectal pain.  Genitourinary: Negative for dysuria, urgency, frequency, hematuria, flank pain, vaginal bleeding, vaginal discharge, difficulty urinating, genital sores, vaginal pain, menstrual problem and pelvic pain.  Musculoskeletal: Negative for back pain and arthralgias.  Skin: Negative for rash.  Neurological: Positive for light-headedness. Negative for dizziness, syncope, speech difficulty, weakness, numbness and headaches.  Hematological: Negative for adenopathy. Does not bruise/bleed easily.  Psychiatric/Behavioral: Negative for suicidal ideas, behavioral problems, self-injury, dysphoric mood and agitation. The patient is not nervous/anxious.        Objective:   Physical Exam  Constitutional: She is oriented to person, place, and time. She appears well-developed and well-nourished.  HENT:  Head: Normocephalic and atraumatic.  Right Ear: External ear normal.  Left Ear: External ear normal.  Mouth/Throat: Oropharynx is clear and moist.  Eyes: Conjunctivae and EOM are normal.  Neck: Normal range of motion. Neck supple. No JVD present. No thyromegaly present.  Cardiovascular: Normal rate, regular rhythm and normal heart sounds.   No murmur heard. The left posterior tibial and the right dorsalis pedis pulse is diminished  Pulmonary/Chest: Effort normal and breath sounds normal. She has no wheezes. She has no rales.  Abdominal: Soft. Bowel sounds are normal. She exhibits no distension and no mass. There is no tenderness. There is no rebound and no guarding.  Genitourinary:  Declines  Musculoskeletal: Normal range of motion. She exhibits no edema and no tenderness.  Neurological: She is alert and oriented to person, place, and time. She has normal reflexes. No cranial nerve deficit. She exhibits normal muscle tone. Coordination normal.  Skin: Skin is warm and dry. No rash noted.  Psychiatric: She has a normal mood and affect. Her  behavior is normal.          Assessment & Plan:   Annual health assessment Diabetes mellitus type 2. Will decrease metformin to 500 mg daily Hypertension. We'll discontinue hydrochlorothiazide Dyslipidemia. Lipid profile reviewed Moderate left carotid artery stenosis Tobacco abuse    We'll recheck in 3 months Smoking cessation encouraged Laboratory update reviewed

## 2013-01-26 NOTE — Patient Instructions (Signed)
Limit your sodium (Salt) intake    It is important that you exercise regularly, at least 20 minutes 3 to 4 times per week.  If you develop chest pain or shortness of breath seek  medical attention.   Please check your hemoglobin A1c every 3 months  Smoking tobacco is very bad for your health. You should stop smoking immediately.  Please see your eye doctor yearly to check for diabetic eye damage

## 2013-02-03 ENCOUNTER — Encounter (HOSPITAL_COMMUNITY): Payer: Self-pay | Admitting: Emergency Medicine

## 2013-02-03 ENCOUNTER — Emergency Department (HOSPITAL_COMMUNITY)
Admission: EM | Admit: 2013-02-03 | Discharge: 2013-02-03 | Discharge status: Against Medical Advice | Payer: Medicare Other | Attending: Emergency Medicine | Admitting: Emergency Medicine

## 2013-02-03 DIAGNOSIS — L0201 Cutaneous abscess of face: Secondary | ICD-10-CM | POA: Insufficient documentation

## 2013-02-03 DIAGNOSIS — L03211 Cellulitis of face: Secondary | ICD-10-CM | POA: Insufficient documentation

## 2013-02-03 DIAGNOSIS — F17200 Nicotine dependence, unspecified, uncomplicated: Secondary | ICD-10-CM | POA: Diagnosis not present

## 2013-02-03 NOTE — ED Notes (Signed)
Pt up to window reporting to NT that she is hungry and in pain and is leaving to go get something to eat. Informed pt that if she leaves, she will have to start over in the triage process. Pt then left. Unable to obtain elopement form signature.

## 2013-02-03 NOTE — ED Notes (Signed)
Pt has been seen for an abscess on her chin.  Had it drained and was given abx.  It now feels worse.

## 2013-02-04 ENCOUNTER — Telehealth: Payer: Self-pay | Admitting: Internal Medicine

## 2013-02-04 MED ORDER — CLINDAMYCIN HCL 150 MG PO CAPS: 300.0000 mg | ORAL_CAPSULE | Freq: Four times a day (QID) | ORAL | Status: DC

## 2013-02-04 NOTE — Telephone Encounter (Signed)
Generic clindamycin 150 mg #50  2 capsules 4 times a day

## 2013-02-04 NOTE — Telephone Encounter (Signed)
Patient Information:  Caller Name: Fayrene Fearing  Phone: [phone]  Patient: [name]  Gender: Female  DOB: 1934-08-03  Age: 79 Years  PCP: Eleonore Chiquito [facility])  Office Follow Up:  Does the office need to follow up with this patient?: Yes  Instructions For The Office: Caller refuses appointment for mouth pain and would only like a refill on Clindamycin. RN emphasized necessity of appointment and caller continued to refuse.  RN Note:  Pain in chin, mouth and cheeks. Small black spot present in mouth where the "infection" is. Seen in office on 01/26/13 by Dr Amador Cunas. Caller has finished Clindamycin RX and would like more. Caller states patient is drinking, voiding, and interacting within normal limits. Patient is able to eat soft foods. Triaged per [facility] Mouth Lesions: Sore, lump, thickening, or discolored area on lips, tongue, or mouth and not healed after 14 days. RN advised appointment within 72 hours. Caller refused.  Symptoms  Reason For Call & Symptoms: mouth pain, infection under tongue  Reviewed Health History In EMR: Yes  Reviewed Medications In EMR: Yes  Reviewed Allergies In EMR: Yes  Reviewed Surgeries / Procedures: Yes  Date of Onset of Symptoms: 01/29/2013  Guideline(s) Used:  No Protocol Available - Sick Adult  Disposition Per Guideline:   See Today or Tomorrow in Office  Reason For Disposition Reached:   Nursing judgment  Advice Given:  Call Back If:  New symptoms develop  You become worse.  Patient Refused Recommendation:  Patient Requests Prescription  Caller refuses appointment

## 2013-02-04 NOTE — Telephone Encounter (Signed)
Spoke to pt told her Rx refill for Clindamycin was sent to pharmacy. Pt verbalized understanding.

## 2013-02-11 ENCOUNTER — Telehealth (HOSPITAL_COMMUNITY): Payer: Self-pay | Admitting: Emergency Medicine

## 2013-02-11 NOTE — ED Notes (Signed)
Call rcvd regarding who pt is to f/u with.  Informed need to return to ED for f/u.

## 2013-02-15 DIAGNOSIS — I10 Essential (primary) hypertension: Secondary | ICD-10-CM | POA: Diagnosis not present

## 2013-02-15 DIAGNOSIS — E785 Hyperlipidemia, unspecified: Secondary | ICD-10-CM | POA: Diagnosis not present

## 2013-02-15 DIAGNOSIS — K0520 Aggressive periodontitis, unspecified: Secondary | ICD-10-CM | POA: Diagnosis not present

## 2013-02-15 DIAGNOSIS — E119 Type 2 diabetes mellitus without complications: Secondary | ICD-10-CM | POA: Diagnosis not present

## 2013-02-23 DIAGNOSIS — D1039 Benign neoplasm of other parts of mouth: Secondary | ICD-10-CM | POA: Diagnosis not present

## 2013-03-15 DIAGNOSIS — Z1231 Encounter for screening mammogram for malignant neoplasm of breast: Secondary | ICD-10-CM | POA: Diagnosis not present

## 2013-03-15 DIAGNOSIS — F17200 Nicotine dependence, unspecified, uncomplicated: Secondary | ICD-10-CM | POA: Diagnosis not present

## 2013-03-15 DIAGNOSIS — I10 Essential (primary) hypertension: Secondary | ICD-10-CM | POA: Diagnosis not present

## 2013-03-15 DIAGNOSIS — Z13820 Encounter for screening for osteoporosis: Secondary | ICD-10-CM | POA: Diagnosis not present

## 2013-03-15 DIAGNOSIS — Z0000 Encounter for general adult medical examination without abnormal findings: Secondary | ICD-10-CM | POA: Diagnosis not present

## 2013-03-15 DIAGNOSIS — E785 Hyperlipidemia, unspecified: Secondary | ICD-10-CM | POA: Diagnosis not present

## 2013-03-15 DIAGNOSIS — Z23 Encounter for immunization: Secondary | ICD-10-CM | POA: Diagnosis not present

## 2013-03-15 DIAGNOSIS — IMO0001 Reserved for inherently not codable concepts without codable children: Secondary | ICD-10-CM | POA: Diagnosis not present

## 2013-03-22 DIAGNOSIS — E11329 Type 2 diabetes mellitus with mild nonproliferative diabetic retinopathy without macular edema: Secondary | ICD-10-CM | POA: Diagnosis not present

## 2013-03-22 DIAGNOSIS — E1139 Type 2 diabetes mellitus with other diabetic ophthalmic complication: Secondary | ICD-10-CM | POA: Diagnosis not present

## 2013-03-22 DIAGNOSIS — H40039 Anatomical narrow angle, unspecified eye: Secondary | ICD-10-CM | POA: Diagnosis not present

## 2013-03-22 DIAGNOSIS — H2510 Age-related nuclear cataract, unspecified eye: Secondary | ICD-10-CM | POA: Diagnosis not present

## 2013-06-08 ENCOUNTER — Encounter (INDEPENDENT_AMBULATORY_CARE_PROVIDER_SITE_OTHER): Payer: Medicare Other

## 2013-06-08 DIAGNOSIS — I6529 Occlusion and stenosis of unspecified carotid artery: Secondary | ICD-10-CM

## 2013-07-21 DIAGNOSIS — H40039 Anatomical narrow angle, unspecified eye: Secondary | ICD-10-CM | POA: Diagnosis not present

## 2013-07-21 DIAGNOSIS — E11329 Type 2 diabetes mellitus with mild nonproliferative diabetic retinopathy without macular edema: Secondary | ICD-10-CM | POA: Diagnosis not present

## 2013-07-21 DIAGNOSIS — H15819 Equatorial staphyloma, unspecified eye: Secondary | ICD-10-CM | POA: Diagnosis not present

## 2013-07-21 DIAGNOSIS — H43819 Vitreous degeneration, unspecified eye: Secondary | ICD-10-CM | POA: Diagnosis not present

## 2013-07-21 DIAGNOSIS — E1139 Type 2 diabetes mellitus with other diabetic ophthalmic complication: Secondary | ICD-10-CM | POA: Diagnosis not present

## 2013-07-21 DIAGNOSIS — H2510 Age-related nuclear cataract, unspecified eye: Secondary | ICD-10-CM | POA: Diagnosis not present

## 2013-07-27 DIAGNOSIS — Z23 Encounter for immunization: Secondary | ICD-10-CM | POA: Diagnosis not present

## 2013-08-18 DIAGNOSIS — I10 Essential (primary) hypertension: Secondary | ICD-10-CM | POA: Diagnosis not present

## 2013-09-03 ENCOUNTER — Encounter: Payer: Self-pay | Admitting: Internal Medicine

## 2013-09-03 ENCOUNTER — Ambulatory Visit (INDEPENDENT_AMBULATORY_CARE_PROVIDER_SITE_OTHER): Payer: Medicare Other | Admitting: Internal Medicine

## 2013-09-03 VITALS — BP 120/84 | HR 113 | Temp 98.0°F | Wt 104.0 lb

## 2013-09-03 DIAGNOSIS — F17200 Nicotine dependence, unspecified, uncomplicated: Secondary | ICD-10-CM

## 2013-09-03 DIAGNOSIS — Z720 Tobacco use: Secondary | ICD-10-CM

## 2013-09-03 DIAGNOSIS — I10 Essential (primary) hypertension: Secondary | ICD-10-CM

## 2013-09-03 DIAGNOSIS — E119 Type 2 diabetes mellitus without complications: Secondary | ICD-10-CM

## 2013-09-03 LAB — HEMOGLOBIN A1C: Hgb A1c MFr Bld: 7 % — ABNORMAL HIGH (ref 4.6–6.5)

## 2013-09-03 MED ORDER — LISINOPRIL-HYDROCHLOROTHIAZIDE 20-25 MG PO TABS: 1.0000 | ORAL_TABLET | Freq: Every day | ORAL | Status: DC

## 2013-09-03 MED ORDER — GLUCOSE BLOOD VI STRP: 1.0000 | ORAL_STRIP | Freq: Every day | Status: DC

## 2013-09-03 NOTE — Progress Notes (Signed)
Subjective:    Patient ID: [name], female    DOB: 1934-03-30, 79 y.o.   MRN: [id number]  HPI  79 year old patient who has hypertension type 2 diabetes and ongoing tobacco use. She is seen today as a work in due to elevated home blood pressure readings. She generally feels well. She did not bring her home blood pressure cuff with her today systolic readings have been 150-200 with diastolic reportedly 85-114  Past Medical History  Diagnosis Date  . Diabetes mellitus, type 2   . Hyperlipidemia   . Hypertension   . History of cervical cancer   . TN (trigeminal neuralgia)   . Raynaud's phenomenon     History   Social History  . Marital Status: Married    Spouse Name: Rosanne Ashing    Number of Children: 1  . Years of Education: N/A   Occupational History  . Unemployed    Social History Main Topics  . Smoking status: Current Every Day Smoker -- 1.50 packs/day  . Smokeless tobacco: Never Used  . Alcohol Use: No  . Drug Use: No  . Sexual Activity: Not on file   Other Topics Concern  . Not on file   Social History Narrative   Married.  Lives in [place] with her husband.  No FH of heart disease.    Past Surgical History  Procedure Laterality Date  . Breast biopsy    . Breast enhancement surgery    . Total abdominal hysterectomy  1963    for cervical cancer in situ    No family history on file.  No Known Allergies  Current Outpatient Prescriptions on File Prior to Visit  Medication Sig Dispense Refill  . aspirin EC 81 MG tablet Take 81 mg by mouth at bedtime.      [name] atorvastatin (LIPITOR) 20 MG tablet Take 1 tablet (20 mg total) by mouth every morning.  90 tablet  6  . clindamycin (CLEOCIN) 150 MG capsule Take 2 capsules (300 mg total) by mouth 4 (four) times daily.  50 capsule  0  . lisinopril (PRINIVIL,ZESTRIL) 20 MG tablet Take 1 tablet (20 mg total) by mouth every morning.  90 tablet  6  . metFORMIN (GLUCOPHAGE) 500 MG tablet Take 1 tablet (500 mg total) by mouth  2 (two) times daily with a meal.  180 tablet  3  . Multiple Vitamins-Minerals (WOMENS 50+ MULTI VITAMIN/MIN PO) Take 1 tablet by mouth every morning.        No current facility-administered medications on file prior to visit.    BP 120/84  Pulse 113  Temp(Src) 98 F (36.7 C) (Oral)  Wt 104 lb (47.174 kg)  BMI 22.72 kg/m2  SpO2 94%       Review of Systems  Constitutional: Negative.   HENT: Negative for congestion, dental problem, hearing loss, rhinorrhea, sinus pressure, sore throat and tinnitus.   Eyes: Negative for pain, discharge and visual disturbance.  Respiratory: Negative for cough and shortness of breath.   Cardiovascular: Negative for chest pain, palpitations and leg swelling.  Gastrointestinal: Negative for nausea, vomiting, abdominal pain, diarrhea, constipation, blood in stool and abdominal distention.  Genitourinary: Negative for dysuria, urgency, frequency, hematuria, flank pain, vaginal bleeding, vaginal discharge, difficulty urinating, vaginal pain and pelvic pain.  Musculoskeletal: Negative for arthralgias, gait problem and joint swelling.  Skin: Negative for rash.  Neurological: Negative for dizziness, syncope, speech difficulty, weakness, numbness and headaches.  Hematological: Negative for adenopathy.  Psychiatric/Behavioral: Negative for behavioral problems, dysphoric mood  and agitation. The patient is not nervous/anxious.        Objective:   Physical Exam  Constitutional: She appears well-developed and well-nourished. No distress.  Blood pressure 160/64 in both arms   Pulmonary/Chest:  A few basilar crackles          Assessment & Plan:   Hypertension. Will switch to lisinopril hydrochlorothiazide. Will continue home blood pressure monitoring. We'll recheck in 4 weeks Diabetes mellitus. We'll check a hemoglobin A1c

## 2013-09-03 NOTE — Patient Instructions (Signed)
Limit your sodium (Salt) intake  Please check your blood pressure on a regular basis.  If it is consistently greater than 150/90, please make an office appointment.   

## 2013-09-06 ENCOUNTER — Ambulatory Visit: Payer: Medicare Other | Admitting: Internal Medicine

## 2013-09-13 ENCOUNTER — Other Ambulatory Visit: Payer: Self-pay | Admitting: *Deleted

## 2013-09-13 MED ORDER — GLUCOSE BLOOD VI STRP: 1.0000 | ORAL_STRIP | Freq: Every day | Status: DC | PRN

## 2013-09-14 ENCOUNTER — Encounter: Payer: Self-pay | Admitting: Internal Medicine

## 2013-09-14 ENCOUNTER — Telehealth: Payer: Self-pay | Admitting: Internal Medicine

## 2013-09-14 ENCOUNTER — Ambulatory Visit (INDEPENDENT_AMBULATORY_CARE_PROVIDER_SITE_OTHER): Payer: Medicare Other | Admitting: Internal Medicine

## 2013-09-14 VITALS — BP 150/80 | HR 99 | Temp 97.9°F | Resp 18 | Wt 103.0 lb

## 2013-09-14 DIAGNOSIS — I10 Essential (primary) hypertension: Secondary | ICD-10-CM | POA: Diagnosis not present

## 2013-09-14 DIAGNOSIS — M542 Cervicalgia: Secondary | ICD-10-CM

## 2013-09-14 MED ORDER — ATORVASTATIN CALCIUM 20 MG PO TABS: 20.0000 mg | ORAL_TABLET | Freq: Every morning | ORAL | Status: DC

## 2013-09-14 NOTE — Telephone Encounter (Signed)
Call received on  Emergent line regarding BP 174/77, Pain in left arm and neck.  Patient had been screened and refused 911.  Call had disconnected when received by RN.  Made multiple attempts to reach patient.  No answer.

## 2013-09-14 NOTE — Progress Notes (Signed)
Subjective:    Patient ID: [name], female    DOB: February 18, 1934, 79 y.o.   MRN: [id number]  HPI  79 year old patient who presents today with a chief complaint of left lateral neck pain of 2 days' duration. The pain is aggravated by head turning to the left and the right. No known trauma. She has had some analgesics at home which has been helpful She is also seen today for followup of high blood pressure. Hydrochlorothiazide was added to her regimen earlier. She continues to have elevated home blood pressure readings and she has been taking a combination of lisinopril hydrochlorothiazide in the morning and lisinopril 20 mg in the afternoon with improved control. She feels well on this regimen  Past Medical History  Diagnosis Date  . Diabetes mellitus, type 2   . Hyperlipidemia   . Hypertension   . History of cervical cancer   . TN (trigeminal neuralgia)   . Raynaud's phenomenon     History   Social History  . Marital Status: Married    Spouse Name: Rosanne Ashing    Number of Children: 1  . Years of Education: N/A   Occupational History  . Unemployed    Social History Main Topics  . Smoking status: Current Every Day Smoker -- 1.50 packs/day  . Smokeless tobacco: Never Used  . Alcohol Use: No  . Drug Use: No  . Sexual Activity: Not on file   Other Topics Concern  . Not on file   Social History Narrative   Married.  Lives in [place] with her husband.  No FH of heart disease.    Past Surgical History  Procedure Laterality Date  . Breast biopsy    . Breast enhancement surgery    . Total abdominal hysterectomy  1963    for cervical cancer in situ    History reviewed. No pertinent family history.  No Known Allergies  Current Outpatient Prescriptions on File Prior to Visit  Medication Sig Dispense Refill  . aspirin EC 81 MG tablet Take 81 mg by mouth at bedtime.      [name] atorvastatin (LIPITOR) 20 MG tablet Take 1 tablet (20 mg total) by mouth every morning.  90 tablet  6   . glucose blood (ONE TOUCH TEST STRIPS) test strip 1 each by Other route daily as needed for other.  100 each  6  . lisinopril-hydrochlorothiazide (PRINZIDE,ZESTORETIC) 20-25 MG per tablet Take 1 tablet by mouth daily.  90 tablet  3  . metFORMIN (GLUCOPHAGE) 500 MG tablet Take 1 tablet (500 mg total) by mouth 2 (two) times daily with a meal.  180 tablet  3  . Multiple Vitamins-Minerals (WOMENS 50+ MULTI VITAMIN/MIN PO) Take 1 tablet by mouth every morning.        No current facility-administered medications on file prior to visit.    BP 150/80  Pulse 99  Temp(Src) 97.9 F (36.6 C) (Oral)  Resp 18  Wt 103 lb (46.72 kg)  BMI 22.5 kg/m2  SpO2 97%       Review of Systems  Constitutional: Negative.   HENT: Negative for congestion, dental problem, hearing loss, rhinorrhea, sinus pressure, sore throat and tinnitus.   Eyes: Negative for pain, discharge and visual disturbance.  Respiratory: Negative for cough and shortness of breath.   Cardiovascular: Negative for chest pain, palpitations and leg swelling.  Gastrointestinal: Negative for nausea, vomiting, abdominal pain, diarrhea, constipation, blood in stool and abdominal distention.  Genitourinary: Negative for dysuria, urgency, frequency, hematuria, flank pain,  vaginal bleeding, vaginal discharge, difficulty urinating, vaginal pain and pelvic pain.  Musculoskeletal: Negative for arthralgias, gait problem and joint swelling.       Left lateral neck pain  Skin: Negative for rash.  Neurological: Negative for dizziness, syncope, speech difficulty, weakness, numbness and headaches.  Hematological: Negative for adenopathy.  Psychiatric/Behavioral: Negative for behavioral problems, dysphoric mood and agitation. The patient is not nervous/anxious.        Objective:   Physical Exam  Constitutional: She is oriented to person, place, and time. She appears well-developed and well-nourished.  Blood pressure 140/60  HENT:  Head:  Normocephalic.  Right Ear: External ear normal.  Left Ear: External ear normal.  Mouth/Throat: Oropharynx is clear and moist.  Edentulous. Oropharynx clear  Eyes: Conjunctivae and EOM are normal. Pupils are equal, round, and reactive to light.  Neck: Neck supple. No thyromegaly present.  Considerable tenderness in the left lateral neck musculature. No adenopathy  Cardiovascular: Normal rate, regular rhythm, normal heart sounds and intact distal pulses.   Pulmonary/Chest: Effort normal and breath sounds normal.  Abdominal: Soft. Bowel sounds are normal. She exhibits no mass. There is no tenderness.  Musculoskeletal: Normal range of motion.  Lymphadenopathy:    She has no cervical adenopathy.  Neurological: She is alert and oriented to person, place, and time.  Skin: Skin is warm and dry. No rash noted.  Psychiatric: She has a normal mood and affect. Her behavior is normal.          Assessment & Plan:   Musculoligamentous left lateral neck pain. We'll place on short-term anti-inflammatory medications. Will use warm compresses Hypertension stable we'll continue present regimen  Recheck 3 months

## 2013-09-14 NOTE — Patient Instructions (Signed)
Limit your sodium (Salt) intake  Please check your blood pressure on a regular basis.  If it is consistently greater than 150/90, please make an office appointment.  You  may move around, but avoid painful motions and activities.  Apply  Heat  to the sore area for 15 to 20 minutes 3 or 4 times daily for the next two to 3 days.  Diclofenac  One three times daily

## 2013-10-06 ENCOUNTER — Inpatient Hospital Stay (HOSPITAL_COMMUNITY)
Admission: EM | Admit: 2013-10-06 | Discharge: 2013-10-08 | DRG: 440 | Disposition: A | Discharge status: Home / Self Care | Payer: Medicare Other | Attending: Internal Medicine | Admitting: Internal Medicine

## 2013-10-06 ENCOUNTER — Emergency Department (HOSPITAL_COMMUNITY): Payer: Medicare Other

## 2013-10-06 ENCOUNTER — Encounter (HOSPITAL_COMMUNITY): Payer: Self-pay | Admitting: Emergency Medicine

## 2013-10-06 DIAGNOSIS — I10 Essential (primary) hypertension: Secondary | ICD-10-CM | POA: Diagnosis present

## 2013-10-06 DIAGNOSIS — R1013 Epigastric pain: Secondary | ICD-10-CM | POA: Diagnosis not present

## 2013-10-06 DIAGNOSIS — R0789 Other chest pain: Secondary | ICD-10-CM | POA: Diagnosis present

## 2013-10-06 DIAGNOSIS — R0989 Other specified symptoms and signs involving the circulatory and respiratory systems: Secondary | ICD-10-CM | POA: Diagnosis not present

## 2013-10-06 DIAGNOSIS — Z8541 Personal history of malignant neoplasm of cervix uteri: Secondary | ICD-10-CM | POA: Diagnosis not present

## 2013-10-06 DIAGNOSIS — E119 Type 2 diabetes mellitus without complications: Secondary | ICD-10-CM | POA: Diagnosis not present

## 2013-10-06 DIAGNOSIS — G500 Trigeminal neuralgia: Secondary | ICD-10-CM | POA: Diagnosis present

## 2013-10-06 DIAGNOSIS — E785 Hyperlipidemia, unspecified: Secondary | ICD-10-CM | POA: Diagnosis present

## 2013-10-06 DIAGNOSIS — T50995A Adverse effect of other drugs, medicaments and biological substances, initial encounter: Secondary | ICD-10-CM | POA: Diagnosis present

## 2013-10-06 DIAGNOSIS — R079 Chest pain, unspecified: Secondary | ICD-10-CM | POA: Diagnosis not present

## 2013-10-06 DIAGNOSIS — Z79899 Other long term (current) drug therapy: Secondary | ICD-10-CM

## 2013-10-06 DIAGNOSIS — I7300 Raynaud's syndrome without gangrene: Secondary | ICD-10-CM | POA: Diagnosis present

## 2013-10-06 DIAGNOSIS — Z720 Tobacco use: Secondary | ICD-10-CM

## 2013-10-06 DIAGNOSIS — R109 Unspecified abdominal pain: Secondary | ICD-10-CM | POA: Diagnosis not present

## 2013-10-06 DIAGNOSIS — K810 Acute cholecystitis: Secondary | ICD-10-CM | POA: Diagnosis not present

## 2013-10-06 DIAGNOSIS — R748 Abnormal levels of other serum enzymes: Secondary | ICD-10-CM | POA: Diagnosis present

## 2013-10-06 DIAGNOSIS — K869 Disease of pancreas, unspecified: Secondary | ICD-10-CM | POA: Diagnosis not present

## 2013-10-06 DIAGNOSIS — F17200 Nicotine dependence, unspecified, uncomplicated: Secondary | ICD-10-CM | POA: Diagnosis present

## 2013-10-06 DIAGNOSIS — Z8669 Personal history of other diseases of the nervous system and sense organs: Secondary | ICD-10-CM | POA: Diagnosis not present

## 2013-10-06 DIAGNOSIS — Z7982 Long term (current) use of aspirin: Secondary | ICD-10-CM | POA: Diagnosis not present

## 2013-10-06 DIAGNOSIS — K859 Acute pancreatitis without necrosis or infection, unspecified: Secondary | ICD-10-CM | POA: Diagnosis not present

## 2013-10-06 DIAGNOSIS — K819 Cholecystitis, unspecified: Secondary | ICD-10-CM | POA: Diagnosis not present

## 2013-10-06 DIAGNOSIS — Z90710 Acquired absence of both cervix and uterus: Secondary | ICD-10-CM | POA: Diagnosis not present

## 2013-10-06 LAB — COMPREHENSIVE METABOLIC PANEL
ALT: 18 U/L (ref 0–35)
AST: 27 U/L (ref 0–37)
Albumin: 3.9 g/dL (ref 3.5–5.2)
Alkaline Phosphatase: 100 U/L (ref 39–117)
CO2: 24 mEq/L (ref 19–32)
Calcium: 10.8 mg/dL — ABNORMAL HIGH (ref 8.4–10.5)
GFR calc non Af Amer: 64 mL/min — ABNORMAL LOW (ref >90)
Sodium: 136 mEq/L (ref 135–145)

## 2013-10-06 LAB — LIPASE, BLOOD: Lipase: 138 U/L — ABNORMAL HIGH (ref 11–59)

## 2013-10-06 LAB — CBC
MCH: 31.3 pg (ref 26.0–34.0)
Platelets: 286 10*3/uL (ref 150–400)
RBC: 4.98 MIL/uL (ref 3.87–5.11)
RDW: 13.3 % (ref 11.5–15.5)
WBC: 10.3 10*3/uL (ref 4.0–10.5)

## 2013-10-06 LAB — POCT I-STAT TROPONIN I
Troponin i, poc: 0 ng/mL (ref 0.00–0.08)
Troponin i, poc: 0 ng/mL (ref 0.00–0.08)

## 2013-10-06 MED ORDER — FENTANYL CITRATE 0.05 MG/ML IJ SOLN
50.0000 ug | Freq: Once | INTRAMUSCULAR | Status: AC
  Administered 2013-10-06: 50 ug via INTRAVENOUS
  Filled 2013-10-06: qty 2

## 2013-10-06 MED ORDER — FAMOTIDINE IN NACL 20-0.9 MG/50ML-% IV SOLN
20.0000 mg | Freq: Once | INTRAVENOUS | Status: AC
  Administered 2013-10-06: 20 mg via INTRAVENOUS
  Filled 2013-10-06: qty 50

## 2013-10-06 MED ORDER — ASPIRIN 81 MG PO CHEW
324.0000 mg | CHEWABLE_TABLET | Freq: Once | ORAL | Status: AC
  Administered 2013-10-06: 324 mg via ORAL
  Filled 2013-10-06: qty 4

## 2013-10-06 MED ORDER — NITROGLYCERIN 0.4 MG SL SUBL: 0.4000 mg | SUBLINGUAL_TABLET | SUBLINGUAL | Status: DC | PRN

## 2013-10-06 MED ORDER — GI COCKTAIL ~~LOC~~
30.0000 mL | Freq: Once | ORAL | Status: AC
  Administered 2013-10-06: 30 mL via ORAL
  Filled 2013-10-06: qty 30

## 2013-10-06 NOTE — ED Notes (Signed)
EKG given to EDP, Bednar,MD.

## 2013-10-06 NOTE — ED Provider Notes (Signed)
CSN: [id number]     Arrival date & time 10/06/13  1959 History   First MD Initiated Contact with Patient 10/06/13 2017     Chief Complaint  Patient presents with  . Chest Pain   (Consider location/radiation/quality/duration/timing/severity/associated sxs/prior Treatment) HPI This 79 year old female has diabetes, hypertension, hyperlipidemia, ongoing tobacco abuse and was told to stop smoking, no documented history of coronary artery disease, has felt well until the last few hours just prior to arrival tonight had gradual onset of vague upper abdominal tightness sensation that lasted a couple hours then migrated to her chest that she has felt for the last hour now prior to arrival a gradual pressure tightness feeling across her entire chest with no longer having any abdominal pain, she is no sudden onset pain no sharp or stabbing or severe pain no back pain no radiation of her pain to her neck or arms or back, she is no fever cough chest pain or shortness of breath, she is no nausea vomiting diaphoresis diarrhea or bloody stools, treatment prior to arrival consisted of over-the-counter ibuprofen which did not help, she has no exertional or pleuritic or positional discomfort, or tightness is mild in her chest. Past Medical History  Diagnosis Date  . Diabetes mellitus, type 2   . Hyperlipidemia   . Hypertension   . History of cervical cancer   . TN (trigeminal neuralgia)   . Raynaud's phenomenon    Past Surgical History  Procedure Laterality Date  . Breast biopsy    . Breast enhancement surgery    . Total abdominal hysterectomy  1963    for cervical cancer in situ   History reviewed. No pertinent family history. History  Substance Use Topics  . Smoking status: Current Every Day Smoker -- 1.50 packs/day  . Smokeless tobacco: Never Used  . Alcohol Use: No   OB History   Grav Para Term Preterm Abortions TAB SAB Ect Mult Living                 Review of Systems 10 Systems reviewed  and are negative for acute change except as noted in the HPI. Allergies  Review of patient's allergies indicates no known allergies.  Home Medications   Current Outpatient Rx  Name  Route  Sig  Dispense  Refill  . aspirin EC 81 MG tablet   Oral   Take 81 mg by mouth at bedtime.         [name] atorvastatin (LIPITOR) 20 MG tablet   Oral   Take 1 tablet (20 mg total) by mouth every morning.   90 tablet   6   . metFORMIN (GLUCOPHAGE) 500 MG tablet   Oral   Take 1 tablet (500 mg total) by mouth 2 (two) times daily with a meal.   180 tablet   3   . Multiple Vitamins-Minerals (WOMENS 50+ MULTI VITAMIN/MIN PO)   Oral   Take 1 tablet by mouth every morning.          [name] levofloxacin (LEVAQUIN) 500 MG tablet   Oral   Take 1 tablet (500 mg total) by mouth daily.   14 tablet   0   . lisinopril (PRINIVIL,ZESTRIL) 20 MG tablet   Oral   Take 1 tablet (20 mg total) by mouth daily.   30 tablet   2   . ondansetron (ZOFRAN ODT) 8 MG disintegrating tablet   Oral   Take 1 tablet (8 mg total) by mouth every 8 (eight) hours as  needed for nausea or vomiting.   10 tablet   0   . oxyCODONE-acetaminophen (PERCOCET/ROXICET) 5-325 MG per tablet   Oral   Take 1 tablet by mouth every 4 (four) hours as needed for severe pain.   20 tablet   0   . pantoprazole (PROTONIX) 40 MG tablet   Oral   Take 1 tablet (40 mg total) by mouth daily.   30 tablet   2    BP 145/55  Pulse 90  Temp(Src) 101.5 F (38.6 C) (Oral)  Resp 18  Ht 4\' 9"  (1.448 m)  Wt 103 lb 13.4 oz (47.1 kg)  BMI 22.46 kg/m2  SpO2 97% Physical Exam  Nursing note and vitals reviewed. Constitutional:  Awake, alert, nontoxic appearance.  HENT:  Head: Atraumatic.  Eyes: Right eye exhibits no discharge. Left eye exhibits no discharge.  Neck: Neck supple.  Cardiovascular: Normal rate and regular rhythm.   No murmur heard. Pulmonary/Chest: Effort normal and breath sounds normal. No respiratory distress. She has no wheezes.  She has no rales. She exhibits no tenderness.  Abdominal: Soft. Bowel sounds are normal. She exhibits no distension and no mass. There is no tenderness. There is no rebound and no guarding.  Musculoskeletal: She exhibits no edema and no tenderness.  Baseline ROM, no obvious new focal weakness.  Neurological: She is alert.  Mental status and motor strength appears baseline for patient and situation.  Skin: No rash noted.  Psychiatric: She has a normal mood and affect.    ED Course  Procedures (including critical care time) Pain-free in ED without receiving nitroglycerin, initial troponin 0, elevated lipase, symptoms potentially from mild pancreatitis, [facility] paged for Obs. 2205 Patient / Family / Caregiver informed of clinical course, understand medical decision-making process, and agree with plan. Labs Review Labs Reviewed  CBC - Abnormal; Notable for the following:    Hemoglobin 15.6 (*)    HCT 46.2 (*)    All other components within normal limits  COMPREHENSIVE METABOLIC PANEL - Abnormal; Notable for the following:    Glucose, Bld 156 (*)    Calcium 10.8 (*)    GFR calc non Af Amer 64 (*)    GFR calc Af Amer 74 (*)    All other components within normal limits  LIPASE, BLOOD - Abnormal; Notable for the following:    Lipase 138 (*)    All other components within normal limits  LIPASE, BLOOD - Abnormal; Notable for the following:    Lipase 220 (*)    All other components within normal limits  HEMOGLOBIN A1C - Abnormal; Notable for the following:    Hemoglobin A1C 6.9 (*)    Mean Plasma Glucose 151 (*)    All other components within normal limits  GLUCOSE, CAPILLARY - Abnormal; Notable for the following:    Glucose-Capillary 127 (*)    All other components within normal limits  GLUCOSE, CAPILLARY - Abnormal; Notable for the following:    Glucose-Capillary 53 (*)    All other components within normal limits  GLUCOSE, CAPILLARY - Abnormal; Notable for the following:     Glucose-Capillary 252 (*)    All other components within normal limits  GLUCOSE, CAPILLARY - Abnormal; Notable for the following:    Glucose-Capillary 123 (*)    All other components within normal limits  GLUCOSE, CAPILLARY - Abnormal; Notable for the following:    Glucose-Capillary 164 (*)    All other components within normal limits  GLUCOSE, CAPILLARY - Abnormal; Notable for  the following:    Glucose-Capillary 133 (*)    All other components within normal limits  GLUCOSE, CAPILLARY - Abnormal; Notable for the following:    Glucose-Capillary 61 (*)    All other components within normal limits  GLUCOSE, CAPILLARY - Abnormal; Notable for the following:    Glucose-Capillary 146 (*)    All other components within normal limits  LIPASE, BLOOD - Abnormal; Notable for the following:    Lipase 74 (*)    All other components within normal limits  GLUCOSE, CAPILLARY - Abnormal; Notable for the following:    Glucose-Capillary 103 (*)    All other components within normal limits  GLUCOSE, CAPILLARY - Abnormal; Notable for the following:    Glucose-Capillary 186 (*)    All other components within normal limits  URINALYSIS, ROUTINE W REFLEX MICROSCOPIC  TROPONIN I  GLUCOSE, CAPILLARY  GLUCOSE, CAPILLARY  POCT I-STAT TROPONIN I  POCT I-STAT TROPONIN I   Imaging Review No results found.  EKG Interpretation    Date/Time:    Ventricular Rate:  50 PR Interval:  133 QRS Duration: 84 QT Interval:  431 QTC Calculation: 393 R Axis:   -16 Text Interpretation:  Sinus rhythm Borderline left axis deviation Low voltage, precordial leads RSR' in V1 or V2, right VCD or RVH Baseline wander in lead(s) V3 Compared to previous tracing Left axis deviation NO LONGER PRESENT            MDM   1. Chest pain   2. Abdominal pain   3. Elevated lipase   4. Atypical chest pain   5. Tobacco abuse   6. HYPERTENSION   7. Pancreatitis   8. Type II or unspecified type diabetes mellitus without  mention of complication, not stated as uncontrolled    The patient appears reasonably stabilized for admission considering the current resources, flow, and capabilities available in the ED at this time, and I doubt any other [facility] requiring further screening and/or treatment in the ED prior to admission.  Hurman Horn, MD 10/11/13 1440

## 2013-10-06 NOTE — H&P (Signed)
[facility] History and Physical  [name] ZOX:[id number] DOB: March 01, 1934 DOA: 10/06/2013  Referring physician: er PCP: Rogelia Boga, MD  Specialists:   Chief Complaint: abdominal pain  HPI: [name] is a 79 y.o. female  Who came into the ER after an episode of epigastric pain.  Pain went from epigastrium to chest.  Pain was sharp in consistency.  The pain lasted a few hours.  Pain is now resolved with fentanyl.  No SOB, +cough, no fever, no chills. No sick contacts.  No dysuria, no diarrhea, No nausea/vomiting- denies alcohol use or change in appetite When pain was severe, she did have some dizziness  PCP recently added HCTZ to pateint's BP regimine as well as anti-inflammatory meds  In the Er, patient is now pain free.  Was found to have a mild elevation of lipase but no elevation of LFTs.  Troponins were negative and pain resolved with decreasing of BP from 200s initially upon presentation to 140s.     Review of Systems: all systems reviewed, negative unless stated above   Past Medical History  Diagnosis Date  . Diabetes mellitus, type 2   . Hyperlipidemia   . Hypertension   . History of cervical cancer   . TN (trigeminal neuralgia)   . Raynaud's phenomenon    Past Surgical History  Procedure Laterality Date  . Breast biopsy    . Breast enhancement surgery    . Total abdominal hysterectomy  1963    for cervical cancer in situ   Social History:  reports that she has been smoking.  She has never used smokeless tobacco. She reports that she does not drink alcohol or use illicit drugs.  No Known Allergies  History reviewed. No pertinent family history.   Prior to Admission medications   Medication Sig Start Date End Date Taking? Authorizing Provider  aspirin EC 81 MG tablet Take 81 mg by mouth at bedtime.   Yes Historical Provider, MD  atorvastatin (LIPITOR) 20 MG tablet Take 1 tablet (20 mg total) by mouth every morning. 09/14/13  Yes Gordy Savers, MD  lisinopril-hydrochlorothiazide (PRINZIDE,ZESTORETIC) 20-25 MG per tablet Take 1 tablet by mouth daily. 09/03/13  Yes Gordy Savers, MD  metFORMIN (GLUCOPHAGE) 500 MG tablet Take 1 tablet (500 mg total) by mouth 2 (two) times daily with a meal. 01/26/13 01/26/14 Yes Gordy Savers, MD  Multiple Vitamins-Minerals (WOMENS 50+ MULTI VITAMIN/MIN PO) Take 1 tablet by mouth every morning.    Yes Historical Provider, MD   Physical Exam: Filed Vitals:   10/06/13 2215  BP: 147/62  Pulse:   Temp:   Resp: 18     General:  A+Ox3, NAD  Eyes: wnl  ENT: wnl  Neck: supple  Cardiovascular: rrr  Respiratory: clear anterior, no wheezing  Abdomen: +BS, soft, mild epigastric tenderness  Skin: no rashes or lesions  Musculoskeletal: moves all 4 ext  Psychiatric: normal mood/affect  Neurologic: CN 2-12 intact  Labs on Admission:  Basic Metabolic Panel:  Recent Labs Lab 10/06/13 2010  NA 136  K 4.2  CL 99  CO2 24  GLUCOSE 156*  BUN 20  CREATININE 0.86  CALCIUM 10.8*   Liver Function Tests:  Recent Labs Lab 10/06/13 2010  AST 27  ALT 18  ALKPHOS 100  BILITOT 0.3  PROT 7.5  ALBUMIN 3.9    Recent Labs Lab 10/06/13 2010  LIPASE 138*   No results found for this basename: AMMONIA,  in the last 168 hours CBC:  Recent Labs Lab 10/06/13 2031  WBC 10.3  HGB 15.6*  HCT 46.2*  MCV 92.8  PLT 286   Cardiac Enzymes: No results found for this basename: CKTOTAL, CKMB, CKMBINDEX, TROPONINI,  in the last 168 hours  BNP (last 3 results) No results found for this basename: PROBNP,  in the last 8760 hours CBG: No results found for this basename: GLUCAP,  in the last 168 hours  Radiological Exams on Admission: Dg Chest Portable 1 View  10/06/2013   CLINICAL DATA:  Chest pain  EXAM: PORTABLE CHEST - 1 VIEW  COMPARISON:  04/02/2012  FINDINGS: Heart size is stable and appears within normal limits. Atherosclerotic calcification of the thoracic aortic  arch is noted. There is mild diffuse pulmonary vascular congestion. No evidence of edema. Streaky left basilar atelectasis or scarring. No focal airspace disease is appreciated. No acute bony abnormality.  IMPRESSION: Mild pulmonary vascular congestion.   Electronically Signed   By: Britta Mccreedy M.D.   On: 10/06/2013 20:54    EKG: Independently reviewed. Sinus, low voltage  Assessment/Plan Active Problems:   DIABETES MELLITUS, TYPE II   HYPERTENSION   Tobacco abuse   Pancreatitis   Atypical chest pain   1. Abdominal pain/elevated lipase- NPO, trend lipase,  LFTs WNL; recenlty given HCTZ for BP control.  no pain currently, recently used more anti-inflammatory pain meds for sore neck- consider ulcer 2. DM- SSI 3. Chest pain- resolving with decrease in blood pressure, cycle CE- currently CP free 4. Tobacco abuse- encourage cessation- counseled about quiting 5. HTN- PRN hydralazine, resume home meds once eating    Code Status: full Family Communication: patient Disposition Plan: obs  Time spent: 75 min  Jeni Duling [facility] Pager [phone]  If 7PM-7AM, please contact night-coverage [email or web address] Password [facility] 10/06/2013, 10:33 PM

## 2013-10-06 NOTE — ED Notes (Signed)
MD at bedside at this time speaking to pt  

## 2013-10-06 NOTE — ED Notes (Signed)
Pt reports new onset CP, central chest, with weakness and lightheadedness at 1800, used a heating pack at home and took 400mg  Ibuprofen without relief. Pt denies previous related hx of CP, but does have hx of hypertension, does not take medication for this. Pt a&o x4 at this time.

## 2013-10-07 ENCOUNTER — Other Ambulatory Visit: Payer: Self-pay

## 2013-10-07 ENCOUNTER — Inpatient Hospital Stay (HOSPITAL_COMMUNITY): Payer: Medicare Other

## 2013-10-07 ENCOUNTER — Encounter (HOSPITAL_COMMUNITY): Payer: Self-pay

## 2013-10-07 DIAGNOSIS — K869 Disease of pancreas, unspecified: Secondary | ICD-10-CM | POA: Diagnosis not present

## 2013-10-07 DIAGNOSIS — R0789 Other chest pain: Secondary | ICD-10-CM | POA: Diagnosis not present

## 2013-10-07 DIAGNOSIS — R109 Unspecified abdominal pain: Secondary | ICD-10-CM | POA: Diagnosis not present

## 2013-10-07 DIAGNOSIS — R079 Chest pain, unspecified: Secondary | ICD-10-CM | POA: Diagnosis not present

## 2013-10-07 LAB — GLUCOSE, CAPILLARY
Glucose-Capillary: 123 mg/dL — ABNORMAL HIGH (ref 70–99)
Glucose-Capillary: 127 mg/dL — ABNORMAL HIGH (ref 70–99)
Glucose-Capillary: 164 mg/dL — ABNORMAL HIGH (ref 70–99)

## 2013-10-07 LAB — URINALYSIS, ROUTINE W REFLEX MICROSCOPIC
Bilirubin Urine: NEGATIVE
Glucose, UA: NEGATIVE mg/dL
Hgb urine dipstick: NEGATIVE
Ketones, ur: NEGATIVE mg/dL
Specific Gravity, Urine: 1.02 (ref 1.005–1.030)
Urobilinogen, UA: 0.2 mg/dL (ref 0.0–1.0)
pH: 6 (ref 5.0–8.0)

## 2013-10-07 LAB — HEMOGLOBIN A1C
Hgb A1c MFr Bld: 6.9 % — ABNORMAL HIGH (ref <5.7)
Mean Plasma Glucose: 151 mg/dL — ABNORMAL HIGH (ref <117)

## 2013-10-07 LAB — TROPONIN I: Troponin I: <0.3 ng/mL (ref <0.30)

## 2013-10-07 LAB — LIPASE, BLOOD: Lipase: 220 U/L — ABNORMAL HIGH (ref 11–59)

## 2013-10-07 MED ORDER — PANTOPRAZOLE SODIUM 40 MG IV SOLR
40.0000 mg | Freq: Every day | INTRAVENOUS | Status: DC
  Administered 2013-10-07: 12:00:00 40 mg via INTRAVENOUS
  Filled 2013-10-07 (×3): qty 40

## 2013-10-07 MED ORDER — HYDRALAZINE HCL 20 MG/ML IJ SOLN
10.0000 mg | INTRAMUSCULAR | Status: DC | PRN
  Administered 2013-10-07 – 2013-10-08 (×2): 10 mg via INTRAVENOUS
  Filled 2013-10-07 (×2): qty 1

## 2013-10-07 MED ORDER — POTASSIUM CHLORIDE IN NACL 20-0.9 MEQ/L-% IV SOLN
INTRAVENOUS | Status: DC
  Administered 2013-10-07 – 2013-10-08 (×3): via INTRAVENOUS
  Filled 2013-10-07 (×4): qty 1000

## 2013-10-07 MED ORDER — ONDANSETRON HCL 4 MG/2ML IJ SOLN: 4.0000 mg | Freq: Four times a day (QID) | INTRAMUSCULAR | Status: DC | PRN

## 2013-10-07 MED ORDER — SODIUM CHLORIDE 0.9 % IJ SOLN
3.0000 mL | Freq: Two times a day (BID) | INTRAMUSCULAR | Status: DC
  Administered 2013-10-07: 01:00:00 3 mL via INTRAVENOUS

## 2013-10-07 MED ORDER — MORPHINE SULFATE 2 MG/ML IJ SOLN: 1.0000 mg | INTRAMUSCULAR | Status: DC | PRN

## 2013-10-07 MED ORDER — INSULIN ASPART 100 UNIT/ML ~~LOC~~ SOLN
0.0000 [IU] | SUBCUTANEOUS | Status: DC
  Administered 2013-10-07: 07:00:00 via SUBCUTANEOUS
  Administered 2013-10-07 (×2): 1 [IU] via SUBCUTANEOUS
  Administered 2013-10-07 – 2013-10-08 (×2): 2 [IU] via SUBCUTANEOUS

## 2013-10-07 MED ORDER — DEXTROSE 50 % IV SOLN
25.0000 mL | Freq: Once | INTRAVENOUS | Status: AC | PRN
  Filled 2013-10-07: qty 50

## 2013-10-07 MED ORDER — ONDANSETRON HCL 4 MG PO TABS: 4.0000 mg | ORAL_TABLET | Freq: Four times a day (QID) | ORAL | Status: DC | PRN

## 2013-10-07 MED ORDER — DEXTROSE 50 % IV SOLN: 50.0000 mL | Freq: Once | INTRAVENOUS | Status: AC | PRN

## 2013-10-07 MED ORDER — INSULIN ASPART 100 UNIT/ML ~~LOC~~ SOLN
0.0000 [IU] | SUBCUTANEOUS | Status: DC
  Administered 2013-10-07: 2 [IU] via SUBCUTANEOUS

## 2013-10-07 MED ORDER — GLUCOSE 40 % PO GEL: 1.0000 | ORAL | Status: DC | PRN

## 2013-10-07 NOTE — Progress Notes (Signed)
[facility] PROGRESS NOTE  [name] WUJ:[id number] DOB: 01-24-1934 DOA: 10/06/2013 PCP: Rogelia Boga, MD  Assessment/Plan:  1. Abdominal pain/elevated lipase; mild pancreatitis -  She is currently NPO as her lipase increased from yesterday.FTs WNL; recenlty given HCTZ for BP control. no pain currently, recently used more anti-inflammatory pain meds for sore neck- possibly gastritis too. Stop HCTZ, start her on IV protonix. [place] abd and start clear liquid diet after the pain improves. 2. Diabetes Mellitus: resume SSI.  3. Chest pain- resolving with decrease in blood pressure, cycle CE- currently CP free 4. Tobacco abuse- encourage cessation- counseled about quiting 5. HTN- PRN hydralazine, resume home meds once eating   Code Status: full  Family Communication:none at bedside.  Disposition Plan: pending.    Consultants:  none  Procedures: US abdomen HPI/Subjective: Wants to eat.   Objective: Filed Vitals:   10/07/13 1339  BP: 120/51  Pulse: 103  Temp: 97.4 F (36.3 C)  Resp: 16    Intake/Output Summary (Last 24 hours) at 10/07/13 1459 Last data filed at 10/07/13 1427  Gross per 24 hour  Intake  997.5 ml  Output   1200 ml  Net -202.5 ml   Filed Weights   10/07/13 0046  Weight: 47.1 kg (103 lb 13.4 oz)    Exam:   General:  Alert afebrile restless  Cardiovascular: s1s2  Respiratory: ctab  Abdomen: soft mild tenderness in the epigastric area. NT bs+  Musculoskeletal: no pedal edema  Data Reviewed: Basic Metabolic Panel:  Recent Labs Lab 10/06/13 2010  NA 136  K 4.2  CL 99  CO2 24  GLUCOSE 156*  BUN 20  CREATININE 0.86  CALCIUM 10.8*   Liver Function Tests:  Recent Labs Lab 10/06/13 2010  AST 27  ALT 18  ALKPHOS 100  BILITOT 0.3  PROT 7.5  ALBUMIN 3.9    Recent Labs Lab 10/06/13 2010 10/07/13 0124  LIPASE 138* 220*   No results found for this basename: AMMONIA,  in the last 168 hours CBC:  Recent  Labs Lab 10/06/13 2031  WBC 10.3  HGB 15.6*  HCT 46.2*  MCV 92.8  PLT 286   Cardiac Enzymes:  Recent Labs Lab 10/07/13 0124  TROPONINI <0.30   BNP (last 3 results) No results found for this basename: PROBNP,  in the last 8760 hours CBG:  Recent Labs Lab 10/07/13 0041 10/07/13 0413 10/07/13 0506 10/07/13 0749 10/07/13 1140  GLUCAP 127* 53* 252* 90 123*    No results found for this or any previous visit (from the past 240 hour(s)).   Studies: Dg Chest Portable 1 View  10/06/2013   CLINICAL DATA:  Chest pain  EXAM: PORTABLE CHEST - 1 VIEW  COMPARISON:  04/02/2012  FINDINGS: Heart size is stable and appears within normal limits. Atherosclerotic calcification of the thoracic aortic arch is noted. There is mild diffuse pulmonary vascular congestion. No evidence of edema. Streaky left basilar atelectasis or scarring. No focal airspace disease is appreciated. No acute bony abnormality.  IMPRESSION: Mild pulmonary vascular congestion.   Electronically Signed   By: Britta Mccreedy M.D.   On: 10/06/2013 20:54    Scheduled Meds: . insulin aspart  0-9 Units Subcutaneous Q4H  . pantoprazole (PROTONIX) IV  40 mg Intravenous Daily  . sodium chloride  3 mL Intravenous Q12H   Continuous Infusions: . 0.9 % NaCl with KCl 20 mEq / L 75 mL/hr at 10/07/13 1433    Active Problems:   DIABETES MELLITUS, TYPE II  HYPERTENSION   Tobacco abuse   Pancreatitis   Atypical chest pain    Time spent: 25 min    Brennan Litzinger  [facility] Pager [phone]. If 7PM-7AM, please contact night-coverage at [email or web address], password [facility] 10/07/2013, 2:59 PM  LOS: 1 day

## 2013-10-07 NOTE — Progress Notes (Signed)
Utilization review completed.  

## 2013-10-08 ENCOUNTER — Emergency Department (HOSPITAL_COMMUNITY)
Admission: EM | Admit: 2013-10-08 | Discharge: 2013-10-09 | Disposition: A | Discharge status: Home / Self Care | Payer: Medicare Other | Attending: Emergency Medicine | Admitting: Emergency Medicine

## 2013-10-08 ENCOUNTER — Emergency Department (HOSPITAL_COMMUNITY): Payer: Medicare Other

## 2013-10-08 ENCOUNTER — Encounter (HOSPITAL_COMMUNITY): Payer: Self-pay | Admitting: Emergency Medicine

## 2013-10-08 DIAGNOSIS — Z8541 Personal history of malignant neoplasm of cervix uteri: Secondary | ICD-10-CM | POA: Insufficient documentation

## 2013-10-08 DIAGNOSIS — K810 Acute cholecystitis: Secondary | ICD-10-CM | POA: Diagnosis not present

## 2013-10-08 DIAGNOSIS — E119 Type 2 diabetes mellitus without complications: Secondary | ICD-10-CM | POA: Insufficient documentation

## 2013-10-08 DIAGNOSIS — K859 Acute pancreatitis without necrosis or infection, unspecified: Principal | ICD-10-CM

## 2013-10-08 DIAGNOSIS — Z7982 Long term (current) use of aspirin: Secondary | ICD-10-CM | POA: Insufficient documentation

## 2013-10-08 DIAGNOSIS — F17200 Nicotine dependence, unspecified, uncomplicated: Secondary | ICD-10-CM | POA: Insufficient documentation

## 2013-10-08 DIAGNOSIS — I10 Essential (primary) hypertension: Secondary | ICD-10-CM | POA: Insufficient documentation

## 2013-10-08 DIAGNOSIS — Z8669 Personal history of other diseases of the nervous system and sense organs: Secondary | ICD-10-CM | POA: Insufficient documentation

## 2013-10-08 DIAGNOSIS — Z90710 Acquired absence of both cervix and uterus: Secondary | ICD-10-CM | POA: Insufficient documentation

## 2013-10-08 DIAGNOSIS — R1013 Epigastric pain: Secondary | ICD-10-CM | POA: Diagnosis not present

## 2013-10-08 DIAGNOSIS — R109 Unspecified abdominal pain: Secondary | ICD-10-CM | POA: Diagnosis not present

## 2013-10-08 DIAGNOSIS — E785 Hyperlipidemia, unspecified: Secondary | ICD-10-CM | POA: Insufficient documentation

## 2013-10-08 DIAGNOSIS — R748 Abnormal levels of other serum enzymes: Secondary | ICD-10-CM | POA: Diagnosis not present

## 2013-10-08 DIAGNOSIS — K819 Cholecystitis, unspecified: Secondary | ICD-10-CM | POA: Diagnosis not present

## 2013-10-08 DIAGNOSIS — Z79899 Other long term (current) drug therapy: Secondary | ICD-10-CM | POA: Insufficient documentation

## 2013-10-08 LAB — COMPREHENSIVE METABOLIC PANEL
AST: 17 U/L (ref 0–37)
Albumin: 3.6 g/dL (ref 3.5–5.2)
Alkaline Phosphatase: 78 U/L (ref 39–117)
BUN: 16 mg/dL (ref 6–23)
Calcium: 8.9 mg/dL (ref 8.4–10.5)
Chloride: 101 mEq/L (ref 96–112)
Creatinine, Ser: 0.89 mg/dL (ref 0.50–1.10)
GFR calc Af Amer: 71 mL/min — ABNORMAL LOW (ref >90)
GFR calc non Af Amer: 61 mL/min — ABNORMAL LOW (ref >90)
Glucose, Bld: 191 mg/dL — ABNORMAL HIGH (ref 70–99)
Potassium: 3.6 mEq/L (ref 3.5–5.1)
Total Protein: 6.8 g/dL (ref 6.0–8.3)

## 2013-10-08 LAB — GLUCOSE, CAPILLARY
Glucose-Capillary: 133 mg/dL — ABNORMAL HIGH (ref 70–99)
Glucose-Capillary: 146 mg/dL — ABNORMAL HIGH (ref 70–99)
Glucose-Capillary: 61 mg/dL — ABNORMAL LOW (ref 70–99)

## 2013-10-08 LAB — CBC
HCT: 42.9 % (ref 36.0–46.0)
MCH: 31.8 pg (ref 26.0–34.0)
MCHC: 34.5 g/dL (ref 30.0–36.0)
MCV: 92.3 fL (ref 78.0–100.0)
Platelets: 246 10*3/uL (ref 150–400)
RDW: 13.7 % (ref 11.5–15.5)
WBC: 14.8 10*3/uL — ABNORMAL HIGH (ref 4.0–10.5)

## 2013-10-08 LAB — LIPASE, BLOOD
Lipase: 74 U/L — ABNORMAL HIGH (ref 11–59)
Lipase: 98 U/L — ABNORMAL HIGH (ref 11–59)

## 2013-10-08 MED ORDER — HYDROMORPHONE HCL PF 1 MG/ML IJ SOLN: 1.0000 mg | Freq: Once | INTRAMUSCULAR | Status: DC

## 2013-10-08 MED ORDER — MORPHINE SULFATE 4 MG/ML IJ SOLN
4.0000 mg | Freq: Once | INTRAMUSCULAR | Status: AC
  Administered 2013-10-08: 4 mg via INTRAVENOUS
  Filled 2013-10-08: qty 1

## 2013-10-08 MED ORDER — PANTOPRAZOLE SODIUM 40 MG PO TBEC
40.0000 mg | DELAYED_RELEASE_TABLET | Freq: Every day | ORAL | Status: DC
  Administered 2013-10-08: 12:00:00 40 mg via ORAL
  Filled 2013-10-08: qty 1

## 2013-10-08 MED ORDER — IOHEXOL 300 MG/ML  SOLN
80.0000 mL | Freq: Once | INTRAMUSCULAR | Status: AC | PRN
  Administered 2013-10-08: 80 mL via INTRAVENOUS

## 2013-10-08 MED ORDER — SODIUM CHLORIDE 0.9 % IV BOLUS (SEPSIS)
500.0000 mL | Freq: Once | INTRAVENOUS | Status: AC
  Administered 2013-10-08: 500 mL via INTRAVENOUS

## 2013-10-08 MED ORDER — GLUCOSE-VITAMIN C 4-6 GM-MG PO CHEW: 4.0000 | CHEWABLE_TABLET | ORAL | Status: DC | PRN

## 2013-10-08 MED ORDER — PANTOPRAZOLE SODIUM 40 MG PO TBEC: 40.0000 mg | DELAYED_RELEASE_TABLET | Freq: Every day | ORAL | Status: DC

## 2013-10-08 MED ORDER — ONDANSETRON HCL 4 MG/2ML IJ SOLN
4.0000 mg | Freq: Once | INTRAMUSCULAR | Status: AC
  Administered 2013-10-08: 4 mg via INTRAVENOUS
  Filled 2013-10-08: qty 2

## 2013-10-08 MED ORDER — PIPERACILLIN-TAZOBACTAM 3.375 G IVPB
3.3750 g | Freq: Once | INTRAVENOUS | Status: AC
  Administered 2013-10-09: 3.375 g via INTRAVENOUS
  Filled 2013-10-08: qty 50

## 2013-10-08 MED ORDER — LISINOPRIL 20 MG PO TABS: 20.0000 mg | ORAL_TABLET | Freq: Every day | ORAL | Status: DC

## 2013-10-08 NOTE — Progress Notes (Signed)
D/C instructions reviewed w/ pt and family friend. All questions answered, no further questions. Both verbalized understanding. Pt d/c in w/c in stable condition by NT. Pt in possession of d/c instructions, scripts, and all personal belongings. Pt d/c to family friend's car.

## 2013-10-08 NOTE — Care Management Note (Signed)
    Page 1 of 1   10/08/2013     2:02:41 PM   CARE MANAGEMENT NOTE 10/08/2013  Patient:  [name], [name]   Account Number:  [id number]  Date Initiated:  10/08/2013  Documentation initiated by:  Lanier Clam  Subjective/Objective Assessment:   79 Y/O F ADMITTED W/DM.     Action/Plan:   FROM HOME.HAS PCP,PHARMACY.   Anticipated DC Date:  10/08/2013   Anticipated DC Plan:  HOME/SELF CARE      DC Planning Services  CM consult      Choice offered to / List presented to:             Status of service:  Completed, signed off Medicare Important Message given?   (If response is "NO", the following Medicare IM given date fields will be blank) Date Medicare IM given:   Date Additional Medicare IM given:    Discharge Disposition:  HOME/SELF CARE  Per UR Regulation:  Reviewed for med. necessity/level of care/duration of stay  If discussed at Long Length of Stay Meetings, dates discussed:    Comments:  10/08/13 Ilir Mahrt RN,BSN NCM [phone] D/C HOME NO NEEDS OR ORDERS.

## 2013-10-08 NOTE — ED Provider Notes (Signed)
CSN: [id number]     Arrival date & time 10/08/13  1847 History   First MD Initiated Contact with Patient 10/08/13 1857     Chief Complaint  Patient presents with  . Abdominal Pain  . Emesis   (Consider location/radiation/quality/duration/timing/severity/associated sxs/prior Treatment) Patient is a 79 y.o. female presenting with abdominal pain and vomiting. The history is provided by the patient.  Abdominal Pain Pain location:  Epigastric Pain quality: aching and sharp   Pain radiates to:  Does not radiate Pain severity:  Severe Onset quality:  Gradual Timing:  Constant Progression:  Worsening Chronicity:  Recurrent Context: recent illness (recently in the hospital for pancreatitis)   Relieved by:  Nothing Worsened by:  Nothing tried Associated symptoms: vomiting   Associated symptoms: no cough, no fever and no shortness of breath   Emesis Associated symptoms: abdominal pain     Past Medical History  Diagnosis Date  . Diabetes mellitus, type 2   . Hyperlipidemia   . Hypertension   . History of cervical cancer   . TN (trigeminal neuralgia)   . Raynaud's phenomenon    Past Surgical History  Procedure Laterality Date  . Breast biopsy    . Breast enhancement surgery    . Total abdominal hysterectomy  1963    for cervical cancer in situ   No family history on file. History  Substance Use Topics  . Smoking status: Current Every Day Smoker -- 1.50 packs/day  . Smokeless tobacco: Never Used  . Alcohol Use: No   OB History   Grav Para Term Preterm Abortions TAB SAB Ect Mult Living                 Review of Systems  Constitutional: Negative for fever.  Respiratory: Negative for cough and shortness of breath.   Gastrointestinal: Positive for vomiting and abdominal pain.  All other systems reviewed and are negative.    Allergies  Review of patient's allergies indicates no known allergies.  Home Medications   Current Outpatient Rx  Name  Route  Sig  Dispense   Refill  . aspirin EC 81 MG tablet   Oral   Take 81 mg by mouth at bedtime.         [name] atorvastatin (LIPITOR) 20 MG tablet   Oral   Take 1 tablet (20 mg total) by mouth every morning.   90 tablet   6   . lisinopril (PRINIVIL,ZESTRIL) 20 MG tablet   Oral   Take 1 tablet (20 mg total) by mouth daily.   30 tablet   2   . metFORMIN (GLUCOPHAGE) 500 MG tablet   Oral   Take 1 tablet (500 mg total) by mouth 2 (two) times daily with a meal.   180 tablet   3   . Multiple Vitamins-Minerals (WOMENS 50+ MULTI VITAMIN/MIN PO)   Oral   Take 1 tablet by mouth every morning.          . pantoprazole (PROTONIX) 40 MG tablet   Oral   Take 1 tablet (40 mg total) by mouth daily.   30 tablet   2    BP 226/80  Pulse 78  Temp(Src) 98.7 F (37.1 C) (Oral)  Resp 20  SpO2 96% Physical Exam  Nursing note and vitals reviewed. Constitutional: She is oriented to person, place, and time. She appears well-developed and well-nourished. No distress.  HENT:  Head: Normocephalic and atraumatic.  Eyes: EOM are normal. Pupils are equal, round, and reactive to  light.  Neck: Normal range of motion. Neck supple.  Cardiovascular: Normal rate and regular rhythm.  Exam reveals no friction rub.   No murmur heard. Pulmonary/Chest: Effort normal and breath sounds normal. No respiratory distress. She has no wheezes. She has no rales.  Abdominal: Soft. She exhibits no distension. There is no tenderness. There is no rebound.  Musculoskeletal: Normal range of motion. She exhibits no edema.  Neurological: She is alert and oriented to person, place, and time.  Skin: No rash noted. She is not diaphoretic.    ED Course  Procedures (including critical care time) Labs Review Labs Reviewed  CBC  COMPREHENSIVE METABOLIC PANEL  LIPASE, BLOOD   Imaging Review US Abdomen Complete  10/07/2013   CLINICAL DATA:  Epigastric pain.  Pancreatitis.  EXAM: ULTRASOUND ABDOMEN COMPLETE  COMPARISON:  None.  FINDINGS:  Gallbladder  A single 0.6 cm hyperechoic structure is seen near in the neck of the gallbladder or cystic duct. There is no gallbladder wall thickening or pericholecystic fluid. No stones within the gallbladder are identified. Sonographer reports negative Murphy's sign.  Common bile duct  Diameter: 0.6 cm.  Liver  No focal lesion is seen. There may be minimal intrahepatic biliary ductal dilatation but note is made that the patient has a normal bilirubin.  IVC  No abnormality visualized.  Pancreas  Visualized portion unremarkable.  Spleen  Size and appearance within normal limits.  Right Kidney  Length: 9.4 cm. Echogenicity within normal limits. No mass or hydronephrosis visualized.  Left Kidney  Length: 9.9 cm. Echogenicity within normal limits. No mass or hydronephrosis visualized.  Abdominal aorta  No aneurysm visualized.  IMPRESSION: Possible single 0.7 cm stone in the neck of the gallbladder versus air within the duodenum. No evidence of cholecystitis is identified.  Possible minimal intrahepatic biliary ductal dilatation. Note is made that the patient has a normal bilirubin so this finding is of doubtful clinical significance.   Electronically Signed   By: Drusilla Kanner M.D.   On: 10/07/2013 15:08   Dg Chest Portable 1 View  10/06/2013   CLINICAL DATA:  Chest pain  EXAM: PORTABLE CHEST - 1 VIEW  COMPARISON:  04/02/2012  FINDINGS: Heart size is stable and appears within normal limits. Atherosclerotic calcification of the thoracic aortic arch is noted. There is mild diffuse pulmonary vascular congestion. No evidence of edema. Streaky left basilar atelectasis or scarring. No focal airspace disease is appreciated. No acute bony abnormality.  IMPRESSION: Mild pulmonary vascular congestion.   Electronically Signed   By: Britta Mccreedy M.D.   On: 10/06/2013 20:54    EKG Interpretation   None       MDM   1. Acute cholecystitis   2. Abdominal pain    4F presents with abdominal pain. Released earlier  today for after admission for pancreatitis. Patient has vomited twice and expressed abdominal pain in upper abdomen since she left.  Epigastric pain on exam. Patient hypertensive, afebrile. Will repeat labs. Had 7 mm stone in GB neck yesterday.   Labs show lipase mildly elevated. CT shows acute cholecystitis. Surgery consulted. I have reviewed all labs and imaging and considered them in my medical decision making.   Dagmar Hait, MD 10/08/13 [phone]

## 2013-10-08 NOTE — ED Notes (Signed)
EDP [place] at bedside. 

## 2013-10-08 NOTE — ED Notes (Signed)
Patient transported to CT 

## 2013-10-08 NOTE — ED Notes (Signed)
Post meds for pain, decreased sats when asleep.  89%.  Placed on 2l per [place].

## 2013-10-08 NOTE — ED Notes (Signed)
Pt was admitted to hospital for an ulcer and d/c'ed. Pt had an ultrasound yesterday and was d/c'ed today. Pt went home and vomited once. Pt's friend states that they told her to come back to ED if she vomited.

## 2013-10-08 NOTE — Progress Notes (Signed)
Hypoglycemic Event  CBG: 61  Treatment: 15 GM carbohydrate snack ( Apple Juice, patient on clears)  Symptoms: none   Follow-up CBG: Time: 0130 CBG Result:147  Possible Reasons for Event: Patient was given 1 unit of novolog to cover 8pm CBG check per order.  Comments/MD notified: follow up CBG was 147, MD was not notified at the time but will have Day shift nurse follow up on this during morning rounds. Will check again at 4am and if hypoglycemic I will notify the on-call MD.    [name]  Remember to initiate Hypoglycemia Order Set & complete

## 2013-10-09 MED ORDER — ONDANSETRON 8 MG PO TBDP: 8.0000 mg | ORAL_TABLET | Freq: Three times a day (TID) | ORAL | Status: DC | PRN

## 2013-10-09 MED ORDER — LEVOFLOXACIN 500 MG PO TABS: 500.0000 mg | ORAL_TABLET | Freq: Every day | ORAL | Status: DC

## 2013-10-09 MED ORDER — OXYCODONE-ACETAMINOPHEN 5-325 MG PO TABS: 1.0000 | ORAL_TABLET | ORAL | Status: DC | PRN

## 2013-10-09 NOTE — Discharge Summary (Signed)
Physician Discharge Summary  [name] ZOX:[id number] DOB: 17-Jun-1934 DOA: 10/06/2013  PCP: Rogelia Boga, MD  Admit date: 10/06/2013 Discharge date: 10/08/2013  Time spent: 25 minutes  Recommendations for Outpatient Follow-up:  1. Follow up with PCP in one week   Discharge Diagnoses:  Active Problems:   DIABETES MELLITUS, TYPE II   HYPERTENSION   Tobacco abuse   Pancreatitis   Atypical chest pain   Discharge Condition: improved.  Diet recommendation: low sodium diet  Filed Weights   10/07/13 0046  Weight: 47.1 kg (103 lb 13.4 oz)    History of present illness:   [name] is a 79 y.o. female Who came into the ER after an episode of epigastric pain. Pain went from epigastrium to chest. Pain was sharp in consistency. The pain lasted a few hours. Pain is now resolved with fentanyl. No SOB, +cough, no fever, no chills. No sick contacts. No dysuria, no diarrhea, No nausea/vomiting- denies alcohol use or change in appetite.  PCP recently added HCTZ to pateint's BP regimine as well as anti-inflammatory meds  In the Er, patient is now pain free. Was found to have a mild elevation of lipase but no elevation of LFTs. Troponins were negative and pain resolved with decreasing of BP from 200s initially upon presentation to 140s.   Hospital Course:   1. Abdominal pain/elevated lipase; mild pancreatitis - possibly from HCTZ. The medication stopped. She was put NPO and started on IV fluids, anti emetics, . Her pain improved, her repeat lipase levels improved , she tolerated diet and she was discharged. US abdomen showed no cholecystitis and unremarkable pancreas. 2. Diabetes Mellitus: resume SSI.  3. Chest pain- resolved. currently CP free 4. Tobacco abuse- encourage cessation- counseled about quiting 5. HTN- controlled.  Procedures:  US ABDOMEN: no cholecystitis. Unremarkable pancreas.  Consultations: none Discharge Exam: Filed Vitals:   10/08/13 0700  BP:    Pulse:   Temp: 98.7  Resp:    General: Alert afebrile restless to go home Cardiovascular: s1s2  Respiratory: ctab  Abdomen: soft no tenderness bs+  Musculoskeletal: no pedal edema   Discharge Instructions  Discharge Orders   Future Appointments Provider Department Dept Phone   12/15/2013 11:15 AM Gordy Savers, MD [facility] [phone]   Future Orders Complete By Expires   Diet - low sodium heart healthy  As directed    Discharge instructions  As directed    Comments:     Follow up with PCP in one week,  You might need referral to a gastroenterology if you have recurrent symptoms.       Medication List    STOP taking these medications       lisinopril-hydrochlorothiazide 20-25 MG per tablet  Commonly known as:  PRINZIDE,ZESTORETIC      TAKE these medications       aspirin EC 81 MG tablet  Take 81 mg by mouth at bedtime.     atorvastatin 20 MG tablet  Commonly known as:  LIPITOR  Take 1 tablet (20 mg total) by mouth every morning.     lisinopril 20 MG tablet  Commonly known as:  PRINIVIL,ZESTRIL  Take 1 tablet (20 mg total) by mouth daily.     metFORMIN 500 MG tablet  Commonly known as:  GLUCOPHAGE  Take 1 tablet (500 mg total) by mouth 2 (two) times daily with a meal.     pantoprazole 40 MG tablet  Commonly known as:  PROTONIX  Take 1 tablet (40  mg total) by mouth daily.     WOMENS 50+ MULTI VITAMIN/MIN PO  Take 1 tablet by mouth every morning.       No Known Allergies     Follow-up Information   Follow up with Rogelia Boga, MD. Schedule an appointment as soon as possible for a visit in 1 week.   Specialty:  Internal Medicine   Contact information:   [address] [place] [address] [phone]        The results of significant diagnostics from this hospitalization (including imaging, microbiology, ancillary and laboratory) are listed below for reference.    Significant Diagnostic  Studies: US Abdomen Complete  10/07/2013   CLINICAL DATA:  Epigastric pain.  Pancreatitis.  EXAM: ULTRASOUND ABDOMEN COMPLETE  COMPARISON:  None.  FINDINGS: Gallbladder  A single 0.6 cm hyperechoic structure is seen near in the neck of the gallbladder or cystic duct. There is no gallbladder wall thickening or pericholecystic fluid. No stones within the gallbladder are identified. Sonographer reports negative Murphy's sign.  Common bile duct  Diameter: 0.6 cm.  Liver  No focal lesion is seen. There may be minimal intrahepatic biliary ductal dilatation but note is made that the patient has a normal bilirubin.  IVC  No abnormality visualized.  Pancreas  Visualized portion unremarkable.  Spleen  Size and appearance within normal limits.  Right Kidney  Length: 9.4 cm. Echogenicity within normal limits. No mass or hydronephrosis visualized.  Left Kidney  Length: 9.9 cm. Echogenicity within normal limits. No mass or hydronephrosis visualized.  Abdominal aorta  No aneurysm visualized.  IMPRESSION: Possible single 0.7 cm stone in the neck of the gallbladder versus air within the duodenum. No evidence of cholecystitis is identified.  Possible minimal intrahepatic biliary ductal dilatation. Note is made that the patient has a normal bilirubin so this finding is of doubtful clinical significance.   Electronically Signed   By: Drusilla Kanner M.D.   On: 10/07/2013 15:08   Ct Abdomen Pelvis W Contrast  10/08/2013   CLINICAL DATA:  Mid abdominal pain.  EXAM: CT ABDOMEN AND PELVIS WITH CONTRAST  TECHNIQUE: Multidetector CT imaging of the abdomen and pelvis was performed using the standard protocol following bolus administration of intravenous contrast.  CONTRAST:  80mL OMNIPAQUE IOHEXOL 300 MG/ML  SOLN  COMPARISON:  Abdominal ultrasound performed 10/07/2013  FINDINGS: An apparent 4 mm nodule is noted at the right lung base (image 4 of 29). Dense calcification is noted at the mitral valve.  Intrahepatic biliary ductal  dilatation is noted, apparently largely new from the recent prior ultrasound. The liver is otherwise unremarkable in appearance. The gallbladder is diffusely distended, with pericholecystic fluid and gallbladder wall thickening, and mild associated soft tissue inflammation. A few stones are seen lodged at the neck of the gallbladder, the largest of which measures 7 mm in size.  The spleen is grossly unremarkable in appearance. The pancreas is within normal limits, with the pancreatic duct borderline normal in size. The adrenal glands are normal in appearance.  Minimal nonspecific perinephric stranding is noted bilaterally. The kidneys are otherwise unremarkable in appearance. There is no evidence of hydronephrosis. No renal or ureteral stones are seen.  No free fluid is identified. The small bowel is unremarkable in appearance. The stomach is within normal limits. No acute vascular abnormalities are seen. Relatively diffuse calcification is noted along the abdominal aorta and its branches.  The appendix is normal in caliber and contains air, without evidence for appendicitis. Diverticulosis is noted  along the ascending, descending and proximal sigmoid colon, without evidence of diverticulitis.  The bladder is mildly distended. Anterior bladder wall thickening may reflect chronic inflammation, though an underlying mass cannot be entirely excluded. Cystoscopy could be considered for further evaluation. A 3.4 cm cystic focus at the right pelvic sidewall appears to reflect a bladder diverticulum. The patient is status post hysterectomy. No suspicious adnexal masses are seen. No inguinal lymphadenopathy is seen.  No acute osseous abnormalities are identified.  IMPRESSION: 1. Acute cholecystitis, with dilatation of the intrahepatic biliary ducts, apparently largely new from the recent prior ultrasound. Gallbladder distention, with pericholecystic fluid and gallbladder wall thickening, and mild associated soft tissue  inflammation. Few stones now seen lodged at the neck of the gallbladder, measuring up to 7 mm in size. 2. Relatively diffuse calcification along the abdominal aorta and its branches. 3. Diverticulosis along the ascending, descending and proximal sigmoid colon, without evidence of diverticulitis. Anterior bladder wall thickening may reflect chronic inflammation, though an underlying mass cannot be entirely excluded. Cystoscopy could be considered for further evaluation, when and as deemed clinically appropriate. 4. Right-sided bladder diverticulum incidentally noted. 5. 4 mm nodule noted at the right lung base. 6. Dense calcification of the mitral valve.   Electronically Signed   By: Roanna Raider M.D.   On: 10/08/2013 23:21   Dg Chest Portable 1 View  10/06/2013   CLINICAL DATA:  Chest pain  EXAM: PORTABLE CHEST - 1 VIEW  COMPARISON:  04/02/2012  FINDINGS: Heart size is stable and appears within normal limits. Atherosclerotic calcification of the thoracic aortic arch is noted. There is mild diffuse pulmonary vascular congestion. No evidence of edema. Streaky left basilar atelectasis or scarring. No focal airspace disease is appreciated. No acute bony abnormality.  IMPRESSION: Mild pulmonary vascular congestion.   Electronically Signed   By: Britta Mccreedy M.D.   On: 10/06/2013 20:54    Microbiology: No results found for this or any previous visit (from the past 240 hour(s)).   Labs: Basic Metabolic Panel:  Recent Labs Lab 10/06/13 2010 10/08/13 1945  NA 136 135  K 4.2 3.6  CL 99 101  CO2 24 21  GLUCOSE 156* 191*  BUN 20 16  CREATININE 0.86 0.89  CALCIUM 10.8* 8.9   Liver Function Tests:  Recent Labs Lab 10/06/13 2010 10/08/13 1945  AST 27 17  ALT 18 13  ALKPHOS 100 78  BILITOT 0.3 0.4  PROT 7.5 6.8  ALBUMIN 3.9 3.6    Recent Labs Lab 10/06/13 2010 10/07/13 0124 10/08/13 0810 10/08/13 1945  LIPASE 138* 220* 74* 98*   No results found for this basename: AMMONIA,  in the  last 168 hours CBC:  Recent Labs Lab 10/06/13 2031 10/08/13 1945  WBC 10.3 14.8*  HGB 15.6* 14.8  HCT 46.2* 42.9  MCV 92.8 92.3  PLT 286 246   Cardiac Enzymes:  Recent Labs Lab 10/07/13 0124  TROPONINI <0.30   BNP: BNP (last 3 results) No results found for this basename: PROBNP,  in the last 8760 hours CBG:  Recent Labs Lab 10/08/13 0051 10/08/13 0135 10/08/13 0421 10/08/13 0731 10/08/13 1149  GLUCAP 61* 146* 85 103* 186*       Signed:  Jovi Alvizo  [facility] 10/08/2013, 11:23 AM

## 2013-10-09 NOTE — ED Notes (Signed)
MD at bedside. 

## 2013-10-09 NOTE — Consult Note (Signed)
Reason for Consult:cholecystitis Referring Physician: [name] is an 79 y.o. female.  HPI: surgery was asked to evaluate this patient for acute cholecystitis. She was admitted Wednesday for abdominal pain which began shortly after eating a hamburger from [employer]. She was diagnosed with pancreatitis. Her symptoms improved and she was discharged earlier today after eating lunch. She said that shortly after she was home she began vomiting and vomited up her lunch. She had a return of her abdominal pain which she describes as epigastric pain and pain across her upper upper abdomen. She denies any fevers or chills. She denies any blood in her stool or melena. She denies any heart problems or history of ulcers. She had an ultrasound yesterday which demonstrated cholelithiasis without evidence of cholecystitis. When she returned today she had a CT scan for evaluation of pancreatitis and this demonstrated a distended gallbladder with thickened wall and pericholecystic fluid concerning for acute cholecystitis.  Past Medical History  Diagnosis Date  . Diabetes mellitus, type 2   . Hyperlipidemia   . Hypertension   . History of cervical cancer   . TN (trigeminal neuralgia)   . Raynaud's phenomenon     Past Surgical History  Procedure Laterality Date  . Breast biopsy    . Breast enhancement surgery    . Total abdominal hysterectomy  1963    for cervical cancer in situ    No family history on file.  Social History:  reports that she has been smoking.  She has never used smokeless tobacco. She reports that she does not drink alcohol or use illicit drugs.  Allergies: No Known Allergies  Medications: I have reviewed the patient's current medications.  Results for orders placed during the hospital encounter of 10/08/13 (from the past 48 hour(s))  CBC     Status: Abnormal   Collection Time    10/08/13  7:45 PM      Result Value Range   WBC 14.8 (*) 4.0 - 10.5 K/uL   RBC 4.65   3.87 - 5.11 MIL/uL   Hemoglobin 14.8  12.0 - 15.0 g/dL   HCT 40.9  81.1 - 91.4 %   MCV 92.3  78.0 - 100.0 fL   MCH 31.8  26.0 - 34.0 pg   MCHC 34.5  30.0 - 36.0 g/dL   RDW 78.2  95.6 - 21.3 %   Platelets 246  150 - 400 K/uL  COMPREHENSIVE METABOLIC PANEL     Status: Abnormal   Collection Time    10/08/13  7:45 PM      Result Value Range   Sodium 135  135 - 145 mEq/L   Potassium 3.6  3.5 - 5.1 mEq/L   Chloride 101  96 - 112 mEq/L   CO2 21  19 - 32 mEq/L   Glucose, Bld 191 (*) 70 - 99 mg/dL   BUN 16  6 - 23 mg/dL   Creatinine, Ser 0.86  0.50 - 1.10 mg/dL   Calcium 8.9  8.4 - 57.8 mg/dL   Total Protein 6.8  6.0 - 8.3 g/dL   Albumin 3.6  3.5 - 5.2 g/dL   AST 17  0 - 37 U/L   ALT 13  0 - 35 U/L   Alkaline Phosphatase 78  39 - 117 U/L   Total Bilirubin 0.4  0.3 - 1.2 mg/dL   GFR calc non Af Amer 61 (*) >90 mL/min   GFR calc Af Amer 71 (*) >90 mL/min   Comment: (NOTE)  The eGFR has been calculated using the CKD EPI equation.     This calculation has not been validated in all clinical situations.     eGFR's persistently <90 mL/min signify possible Chronic Kidney     Disease.  LIPASE, BLOOD     Status: Abnormal   Collection Time    10/08/13  7:45 PM      Result Value Range   Lipase 98 (*) 11 - 59 U/L    US Abdomen Complete  10/07/2013   CLINICAL DATA:  Epigastric pain.  Pancreatitis.  EXAM: ULTRASOUND ABDOMEN COMPLETE  COMPARISON:  None.  FINDINGS: Gallbladder  A single 0.6 cm hyperechoic structure is seen near in the neck of the gallbladder or cystic duct. There is no gallbladder wall thickening or pericholecystic fluid. No stones within the gallbladder are identified. Sonographer reports negative Murphy's sign.  Common bile duct  Diameter: 0.6 cm.  Liver  No focal lesion is seen. There may be minimal intrahepatic biliary ductal dilatation but note is made that the patient has a normal bilirubin.  IVC  No abnormality visualized.  Pancreas  Visualized portion unremarkable.  Spleen   Size and appearance within normal limits.  Right Kidney  Length: 9.4 cm. Echogenicity within normal limits. No mass or hydronephrosis visualized.  Left Kidney  Length: 9.9 cm. Echogenicity within normal limits. No mass or hydronephrosis visualized.  Abdominal aorta  No aneurysm visualized.  IMPRESSION: Possible single 0.7 cm stone in the neck of the gallbladder versus air within the duodenum. No evidence of cholecystitis is identified.  Possible minimal intrahepatic biliary ductal dilatation. Note is made that the patient has a normal bilirubin so this finding is of doubtful clinical significance.   Electronically Signed   By: Drusilla Kanner M.D.   On: 10/07/2013 15:08   Ct Abdomen Pelvis W Contrast  10/08/2013   CLINICAL DATA:  Mid abdominal pain.  EXAM: CT ABDOMEN AND PELVIS WITH CONTRAST  TECHNIQUE: Multidetector CT imaging of the abdomen and pelvis was performed using the standard protocol following bolus administration of intravenous contrast.  CONTRAST:  80mL OMNIPAQUE IOHEXOL 300 MG/ML  SOLN  COMPARISON:  Abdominal ultrasound performed 10/07/2013  FINDINGS: An apparent 4 mm nodule is noted at the right lung base (image 4 of 29). Dense calcification is noted at the mitral valve.  Intrahepatic biliary ductal dilatation is noted, apparently largely new from the recent prior ultrasound. The liver is otherwise unremarkable in appearance. The gallbladder is diffusely distended, with pericholecystic fluid and gallbladder wall thickening, and mild associated soft tissue inflammation. A few stones are seen lodged at the neck of the gallbladder, the largest of which measures 7 mm in size.  The spleen is grossly unremarkable in appearance. The pancreas is within normal limits, with the pancreatic duct borderline normal in size. The adrenal glands are normal in appearance.  Minimal nonspecific perinephric stranding is noted bilaterally. The kidneys are otherwise unremarkable in appearance. There is no evidence of  hydronephrosis. No renal or ureteral stones are seen.  No free fluid is identified. The small bowel is unremarkable in appearance. The stomach is within normal limits. No acute vascular abnormalities are seen. Relatively diffuse calcification is noted along the abdominal aorta and its branches.  The appendix is normal in caliber and contains air, without evidence for appendicitis. Diverticulosis is noted along the ascending, descending and proximal sigmoid colon, without evidence of diverticulitis.  The bladder is mildly distended. Anterior bladder wall thickening may reflect chronic inflammation, though an underlying  mass cannot be entirely excluded. Cystoscopy could be considered for further evaluation. A 3.4 cm cystic focus at the right pelvic sidewall appears to reflect a bladder diverticulum. The patient is status post hysterectomy. No suspicious adnexal masses are seen. No inguinal lymphadenopathy is seen.  No acute osseous abnormalities are identified.  IMPRESSION: 1. Acute cholecystitis, with dilatation of the intrahepatic biliary ducts, apparently largely new from the recent prior ultrasound. Gallbladder distention, with pericholecystic fluid and gallbladder wall thickening, and mild associated soft tissue inflammation. Few stones now seen lodged at the neck of the gallbladder, measuring up to 7 mm in size. 2. Relatively diffuse calcification along the abdominal aorta and its branches. 3. Diverticulosis along the ascending, descending and proximal sigmoid colon, without evidence of diverticulitis. Anterior bladder wall thickening may reflect chronic inflammation, though an underlying mass cannot be entirely excluded. Cystoscopy could be considered for further evaluation, when and as deemed clinically appropriate. 4. Right-sided bladder diverticulum incidentally noted. 5. 4 mm nodule noted at the right lung base. 6. Dense calcification of the mitral valve.   Electronically Signed   By: Roanna Raider M.D.    On: 10/08/2013 23:21    ROS All other review of systems negative or noncontributory except as stated in the HPI  Blood pressure 156/52, pulse 70, temperature 98.7 F (37.1 C), temperature source Oral, resp. rate 15, SpO2 90.00%. General appearance: alert, cooperative and no distress Resp: clear to auscultation bilaterally Cardio: normal rate, regular GI: soft, no significant tenderness on exam, ND, no murphy's sign, no peritoneal signs Extremities: extremities normal, atraumatic, no cyanosis or edema Neurologic: Grossly normal  Assessment/Plan: Abdominal pain, likely acute cholecystitis That her symptoms are most likely consistent with acute cholecystitis. This is probably what her symptoms were the last 2 days. She does have an elevated white blood cell count and a CT scan concerning for acute cholecystitis given the thickened gallbladder, gallstones, and pericholecystic fluid. I have recommended that she be readmitted for IV fluids and antibiotics and cholecystectomy. I did discuss with her that I am concerned that her gallbladder is infected and the standard treatment for this is antibiotics and surgery to remove her gallbladder. A translator is present and she seems to understand our conversation, but at this time, she is refusing admission and surgery. Again, I did make it clear that my recommendation was for admission with antibiotics and surgery to remove her gallbladder but since she is feeling well at this time and really doesn't have any tenderness on exam, she would like to follow up with her primary care physician and get his recommendations prior to any surgery. I explained that her symptoms could progress. I also reviewed my recommendations with the emergency room provider.  If she changes her mind and would like to undergo surgery, then we will be available for repeat consultation or management.  Lodema Pilot DAVID 10/09/2013, 1:22 AM

## 2013-10-09 NOTE — ED Provider Notes (Signed)
1:50 AM I had a prolonged discussion with the patient and used the close family friend helping with translation.  The patient also speaks rather good [place].  I felt as though is able to communicate well with this patient.  The patient appears to have cholecystitis and was recommended to be admitted with IV antibiotics and likely cholecystectomy.  General surgery, Dr. Biagio Quint, evaluated the patient and agrees that the patient needs IV antibiotics and admission for cholecystitis and likely cholecystectomy.  The patient at this time is not interested inbeing admitted or having her gallbladder removed.  I had a prolonged discussion with the patient and described all of the risks to the patient including the risk of worsening serious abdominal infection including sepsis and death.  The patient understands the risks and is still interested in going home.  She'll be sent home with a prescription for Levaquin as well as a prescription for pain and nausea.  She understands to return to the ER as needed for any new or worsening symptoms or if she changes her mind at any time.  [name], MD 10/09/13 [phone]

## 2013-10-15 ENCOUNTER — Ambulatory Visit (INDEPENDENT_AMBULATORY_CARE_PROVIDER_SITE_OTHER): Payer: Medicare Other | Admitting: Internal Medicine

## 2013-10-15 ENCOUNTER — Encounter: Payer: Self-pay | Admitting: Internal Medicine

## 2013-10-15 ENCOUNTER — Telehealth: Payer: Self-pay | Admitting: Internal Medicine

## 2013-10-15 ENCOUNTER — Ambulatory Visit: Payer: Medicare Other | Admitting: Internal Medicine

## 2013-10-15 VITALS — BP 124/54 | HR 88 | Temp 97.6°F | Wt 98.0 lb

## 2013-10-15 DIAGNOSIS — T7840XA Allergy, unspecified, initial encounter: Secondary | ICD-10-CM | POA: Diagnosis not present

## 2013-10-15 DIAGNOSIS — L270 Generalized skin eruption due to drugs and medicaments taken internally: Secondary | ICD-10-CM

## 2013-10-15 DIAGNOSIS — K8000 Calculus of gallbladder with acute cholecystitis without obstruction: Secondary | ICD-10-CM

## 2013-10-15 MED ORDER — METHYLPREDNISOLONE ACETATE 80 MG/ML IJ SUSP
80.0000 mg | Freq: Once | INTRAMUSCULAR | Status: AC
  Administered 2013-10-15: 80 mg via INTRAMUSCULAR

## 2013-10-15 NOTE — Telephone Encounter (Signed)
Patient Information:  Caller Name: [name]  Phone: [phone]  Patient: [name], [name]  Gender: Female  DOB: [birth year]  Age: 79 Years  PCP: Eleonore Chiquito (Family Practice > 66yrs old)  Office Follow Up:  Does the office need to follow up with this patient?: No  Instructions For The Office: N/A   Symptoms  Reason For Call & Symptoms: Today, 10/15/2013, Pt  calling stating pt had vomiting  episode last night at 2000 and then again at 0630 this morning. She has tolerated some coffee. She also has widespread itching with red , bumpy  rash since 10/14/2013.  Some bumps look like blisters. Pt stating she ate fish 10/13/2013 and wonders if it is spolied. She was admitted to [facility] for abdominal and chest pain and  diagnosed with gallbladder problems,. She was  discharged on 10/09/2013. and still taking  Levofloxacin. Also with elbow pain.  Reviewed Health History In EMR: Yes  Reviewed Medications In EMR: Yes  Reviewed Allergies In EMR: Yes  Reviewed Surgeries / Procedures: Yes  Date of Onset of Symptoms: 10/14/2013  Treatments Tried: Cortisone  Treatments Tried Worked: No  Guideline(s) Used:  Rash - Widespread on Drugs - Drug Reaction  Disposition Per Guideline:   Go to Office Now  Reason For Disposition Reached:   Joint pain or swelling  Advice Given:  Stopping the Medication:  If medication is an antibiotic - stop the medication. Reason: possible allergy.  Oral Antihistamine Medication for Itching:  Take an antihistamine like diphenhydramine (Benadryl) for widespread rashes that itch. The adult dosage of Benadryl is 25-50 mg by mouth 4 times daily.  Call Back If:  You become worse.  Patient Will Follow Care Advice:  YES  Appointment Scheduled:  10/15/2013 09:45:00    - NO appts within 4 hours with Dr. Bonnetta Barry and scheduled appt with Dr. Fabian Sharp  Appointment Scheduled Provider:  Berniece Andreas [facility])

## 2013-10-15 NOTE — Patient Instructions (Addendum)
I believe that your rash is an allergic reaction to the antibiotic Levaquin. We are giving you a cortisone shot today to decrease the swelling and reaction Would also like you to take oral Benadryl or diphenhydramine 25 mg capsules or pills up to every 6 hours for itching but this could make you drowsy. This is an antihistamine that can decrease the allergic reaction and itching. The cortisone shots can increase her blood sugar temporarily contact us if very high. Stop the Levaquin antibiotic Call the on-call service if you're having fever or worsening instead of improvement. Or having any difficulty breathing or having vomiting or swelling in the body.  Arrange a followup visit with Dr. Kirtland Bouchard  next week or when available

## 2013-10-15 NOTE — Progress Notes (Signed)
Chief Complaint  Patient presents with  . Rash    Recently seen in the ED.    HPI: Patient comes in today for SDA for  new problem evaluation. Send in by CAN  pcp  had no appt until this afgternoon and can felt this was an emergency . See phone note Was in the ed for acut cholecystitis on November 14 with abdominal pain abnormal CT with some bile duct dilatation elevated white count and lipase and didn't want surgery so was sent home after antibiotics IV levaquin . She states that she is taking care of a sick spells and didn't have time for surgery.   Since that time her stomach calms down no associated fever. However yesterday had the onset of itchy rash trunk that has spread and is very itchy for her. She has used topical hydrocortisone but no antihistamine. She wonders if she ate some fish she was allergic to but that was 2 days before. Denies any chest pain shortness of breath or systemic symptoms with this at this time.  ROS: See pertinent positives and negatives per HPI. No syncope edema. No new medications except for the Levaquin  Past Medical History  Diagnosis Date  . Diabetes mellitus, type 2   . Hyperlipidemia   . Hypertension   . History of cervical cancer   . TN (trigeminal neuralgia)   . Raynaud's phenomenon     History reviewed. No pertinent family history.  History   Social History  . Marital Status: Married    Spouse Name: Rosanne Ashing    Number of Children: 1  . Years of Education: N/A   Occupational History  . Unemployed    Social History Main Topics  . Smoking status: Current Every Day Smoker -- 1.50 packs/day  . Smokeless tobacco: Never Used  . Alcohol Use: No  . Drug Use: No  . Sexual Activity: None   Other Topics Concern  . None   Social History Narrative   Married.  Lives in [place] with her husband.  No FH of heart disease.    Outpatient Encounter Prescriptions as of 10/15/2013  Medication Sig  . aspirin EC 81 MG tablet Take 81 mg by mouth at  bedtime.  [name] atorvastatin (LIPITOR) 20 MG tablet Take 1 tablet (20 mg total) by mouth every morning.  [name] levofloxacin (LEVAQUIN) 500 MG tablet Take 1 tablet (500 mg total) by mouth daily.  [name] lisinopril (PRINIVIL,ZESTRIL) 20 MG tablet Take 1 tablet (20 mg total) by mouth daily.  . metFORMIN (GLUCOPHAGE) 500 MG tablet Take 1 tablet (500 mg total) by mouth 2 (two) times daily with a meal.  . Multiple Vitamins-Minerals (WOMENS 50+ MULTI VITAMIN/MIN PO) Take 1 tablet by mouth every morning.   . pantoprazole (PROTONIX) 40 MG tablet Take 1 tablet (40 mg total) by mouth daily.  . [DISCONTINUED] ondansetron (ZOFRAN ODT) 8 MG disintegrating tablet Take 1 tablet (8 mg total) by mouth every 8 (eight) hours as needed for nausea or vomiting.  . [DISCONTINUED] oxyCODONE-acetaminophen (PERCOCET/ROXICET) 5-325 MG per tablet Take 1 tablet by mouth every 4 (four) hours as needed for severe pain.  . [EXPIRED] methylPREDNISolone acetate (DEPO-MEDROL) injection 80 mg     EXAM:  BP 124/54  Pulse 88  Temp(Src) 97.6 F (36.4 C) (Oral)  Wt 98 lb (44.453 kg)  Body mass index is 21.2 kg/(m^2).  GENERAL: vitals reviewed and listed above, alert, oriented, appears well hydrated and in no acute distress pleasant normal speech respirations quiet with obvious  red rash. HEENT: atraumatic, conjunctiva  clear, no obvious abnormalities on inspection of external nose and ears OP : no lesion edema or exudate airway is clear NECK: no obvious masses on inspection palpation no adenopathy LUNGS: clear to auscultation bilaterally, no wheezes, rales or rhonchi, good air movement CV: HRRR, no clubbing cyanosis or  peripheral edema nl cap refill  Abdomen soft no focal tenderness guarding or rebound at this time no guarding although she might be tender and not admitting to it. MS: moves all extremities without noticeable focal  abnormality Skin  diffuse erythema on arms extensive blotchy urticaria type rash trunk minimal on face some on  distal legs No angioedema PSYCH: pleasant and cooperative, no obvious depression or anxiety ambulatory  ASSESSMENT AND PLAN:  Discussed the following assessment and plan:  Allergic reaction, initial encounter - Probably to Levaquin to stop add antihistamines Depo-Medrol today. - Plan: methylPREDNISolone acetate (DEPO-MEDROL) injection 80 mg  Antibiotic rash  Calculus of gallbladder with acute cholecystitis, without mention of obstruction - Improved symptoms at this time but must stop antibiotic discussed advisability of surgery to avoid emergency procedures. She has a sick spells at home and encouraged to discuss with her PCP about cholecystectomy to avoid emergency surgery. -Patient advised to return or notify health care team  if symptoms worsen or persist or new concerns arise.  Patient Instructions  I believe that your rash is an allergic reaction to the antibiotic Levaquin. We are giving you a cortisone shot today to decrease the swelling and reaction Would also like you to take oral Benadryl or diphenhydramine 25 mg capsules or pills up to every 6 hours for itching but this could make you drowsy. This is an antihistamine that can decrease the allergic reaction and itching. The cortisone shots can increase her blood sugar temporarily contact us if very high. Stop the Levaquin antibiotic Call the on-call service if you're having fever or worsening instead of improvement. Or having any difficulty breathing or having vomiting or swelling in the body.  Arrange a followup visit with Dr. Kirtland Bouchard  next week or when available   Burna Mortimer K. Delicia Berens M.D.

## 2013-10-20 ENCOUNTER — Encounter: Payer: Self-pay | Admitting: Internal Medicine

## 2013-10-20 ENCOUNTER — Ambulatory Visit (INDEPENDENT_AMBULATORY_CARE_PROVIDER_SITE_OTHER): Payer: Medicare Other | Admitting: Internal Medicine

## 2013-10-20 VITALS — BP 130/80 | HR 84 | Temp 98.0°F | Resp 20 | Wt 105.0 lb

## 2013-10-20 DIAGNOSIS — E119 Type 2 diabetes mellitus without complications: Secondary | ICD-10-CM

## 2013-10-20 DIAGNOSIS — I10 Essential (primary) hypertension: Secondary | ICD-10-CM | POA: Diagnosis not present

## 2013-10-20 DIAGNOSIS — I7300 Raynaud's syndrome without gangrene: Secondary | ICD-10-CM | POA: Diagnosis not present

## 2013-10-20 DIAGNOSIS — K859 Acute pancreatitis without necrosis or infection, unspecified: Secondary | ICD-10-CM | POA: Diagnosis not present

## 2013-10-20 NOTE — Progress Notes (Signed)
Pre-visit discussion using our clinic review tool. No additional management support is needed unless otherwise documented below in the visit note.  

## 2013-10-20 NOTE — Progress Notes (Signed)
Subjective:    Patient ID: [name], female    DOB: 28-Sep-1934, 79 y.o.   MRN: [id number]  HPI  Pre-visit discussion using our clinic review tool. No additional management support is needed unless otherwise documented below in the visit note.  79 year old patient who is seen today in followup. She has diabetes hypertension ongoing tobacco use. She was seen in ED 2 weeks ago for abdominal pain and was felt to have a gallstone pancreatitis. She was placed on Levaquin and subsequently developed a rash. This was discontinued and rash is improving. She states her diabetes has done quite well. A hemoglobin A1c was checked 2 weeks ago and was 6.9. She generally feels well no recurrent abdominal pain no fever or other constitutional complaints. Her only complaint is improving pruritic rash. She also complains of some numbness and color changes involving her fingers. She does have a history of Raynauds.  Past Medical History  Diagnosis Date  . Diabetes mellitus, type 2   . Hyperlipidemia   . Hypertension   . History of cervical cancer   . TN (trigeminal neuralgia)   . Raynaud's phenomenon     History   Social History  . Marital Status: Married    Spouse Name: Rosanne Ashing    Number of Children: 1  . Years of Education: N/A   Occupational History  . Unemployed    Social History Main Topics  . Smoking status: Current Every Day Smoker -- 1.50 packs/day  . Smokeless tobacco: Never Used  . Alcohol Use: No  . Drug Use: No  . Sexual Activity: Not on file   Other Topics Concern  . Not on file   Social History Narrative   Married.  Lives in [place] with her husband.  No FH of heart disease.    Past Surgical History  Procedure Laterality Date  . Breast biopsy    . Breast enhancement surgery    . Total abdominal hysterectomy  1963    for cervical cancer in situ    History reviewed. No pertinent family history.  No Known Allergies  Current Outpatient Prescriptions on File Prior  to Visit  Medication Sig Dispense Refill  . aspirin EC 81 MG tablet Take 81 mg by mouth at bedtime.      [name] atorvastatin (LIPITOR) 20 MG tablet Take 1 tablet (20 mg total) by mouth every morning.  90 tablet  6  . lisinopril (PRINIVIL,ZESTRIL) 20 MG tablet Take 1 tablet (20 mg total) by mouth daily.  30 tablet  2  . metFORMIN (GLUCOPHAGE) 500 MG tablet Take 1 tablet (500 mg total) by mouth 2 (two) times daily with a meal.  180 tablet  3  . Multiple Vitamins-Minerals (WOMENS 50+ MULTI VITAMIN/MIN PO) Take 1 tablet by mouth every morning.       . pantoprazole (PROTONIX) 40 MG tablet Take 1 tablet (40 mg total) by mouth daily.  30 tablet  2   No current facility-administered medications on file prior to visit.    BP 130/80  Pulse 84  Temp(Src) 98 F (36.7 C) (Oral)  Resp 20  Wt 105 lb (47.628 kg)       Review of Systems  Constitutional: Negative.   HENT: Negative for congestion, dental problem, hearing loss, rhinorrhea, sinus pressure, sore throat and tinnitus.   Eyes: Negative for pain, discharge and visual disturbance.  Respiratory: Negative for cough and shortness of breath.   Cardiovascular: Negative for chest pain, palpitations and leg swelling.  Gastrointestinal: Negative  for nausea, vomiting, abdominal pain, diarrhea, constipation, blood in stool and abdominal distention.  Genitourinary: Negative for dysuria, urgency, frequency, hematuria, flank pain, vaginal bleeding, vaginal discharge, difficulty urinating, vaginal pain and pelvic pain.  Musculoskeletal: Negative for arthralgias, gait problem and joint swelling.  Skin: Positive for rash.  Neurological: Negative for dizziness, syncope, speech difficulty, weakness, numbness and headaches.  Hematological: Negative for adenopathy.  Psychiatric/Behavioral: Negative for behavioral problems, dysphoric mood and agitation. The patient is not nervous/anxious.        Objective:   Physical Exam  Constitutional: She is oriented to  person, place, and time. She appears well-developed and well-nourished.  HENT:  Head: Normocephalic.  Right Ear: External ear normal.  Left Ear: External ear normal.  Mouth/Throat: Oropharynx is clear and moist.  Eyes: Conjunctivae and EOM are normal. Pupils are equal, round, and reactive to light.  Neck: Normal range of motion. Neck supple. No thyromegaly present.  Cardiovascular: Normal rate, regular rhythm, normal heart sounds and intact distal pulses.   Pulmonary/Chest: Effort normal and breath sounds normal.  Abdominal: Soft. Bowel sounds are normal. She exhibits no distension and no mass. There is no tenderness. There is no rebound and no guarding.  Musculoskeletal: Normal range of motion.  Lymphadenopathy:    She has no cervical adenopathy.  Neurological: She is alert and oriented to person, place, and time.  Skin: Skin is warm and dry. No rash noted.  Patchy erythematous maculopapular rash  Psychiatric: She has a normal mood and affect. Her behavior is normal.          Assessment & Plan:   History of gallstone pancreatitis. Now symptom-free. The patient has declined surgical intervention. Will observe at this point may need to reconsider if becomes more symptomatic in the future Diabetes well controlled Hypertension stable Raynaud's disease. We'll attempt to protect from cold exposure. Total smoking cessation encouraged  Recheck 3 months or as needed

## 2013-10-20 NOTE — Patient Instructions (Signed)
Please check your hemoglobin A1c every 3 months  Call if there is any recurrent abdominal pain  Smoking tobacco is very bad for your health. You should stop smoking immediately.

## 2013-12-14 ENCOUNTER — Other Ambulatory Visit: Payer: Self-pay | Admitting: Internal Medicine

## 2013-12-15 ENCOUNTER — Ambulatory Visit (INDEPENDENT_AMBULATORY_CARE_PROVIDER_SITE_OTHER): Payer: Medicare Other | Admitting: Internal Medicine

## 2013-12-15 ENCOUNTER — Encounter: Payer: Self-pay | Admitting: Internal Medicine

## 2013-12-15 VITALS — BP 120/80 | HR 87 | Temp 98.1°F | Resp 18 | Ht <= 58 in | Wt 104.0 lb

## 2013-12-15 DIAGNOSIS — K8020 Calculus of gallbladder without cholecystitis without obstruction: Secondary | ICD-10-CM | POA: Diagnosis not present

## 2013-12-15 DIAGNOSIS — I10 Essential (primary) hypertension: Secondary | ICD-10-CM | POA: Diagnosis not present

## 2013-12-15 DIAGNOSIS — E119 Type 2 diabetes mellitus without complications: Secondary | ICD-10-CM

## 2013-12-15 DIAGNOSIS — K859 Acute pancreatitis without necrosis or infection, unspecified: Secondary | ICD-10-CM | POA: Diagnosis not present

## 2013-12-15 LAB — COMPREHENSIVE METABOLIC PANEL
ALBUMIN: 4 g/dL (ref 3.5–5.2)
ALT: 15 U/L (ref 0–35)
AST: 20 U/L (ref 0–37)
Alkaline Phosphatase: 71 U/L (ref 39–117)
BUN: 13 mg/dL (ref 6–23)
CALCIUM: 9.7 mg/dL (ref 8.4–10.5)
CO2: 30 meq/L (ref 19–32)
Chloride: 104 mEq/L (ref 96–112)
Creatinine, Ser: 0.9 mg/dL (ref 0.4–1.2)
GFR: 62.76 mL/min (ref >60.00)
Glucose, Bld: 73 mg/dL (ref 70–99)
POTASSIUM: 3.8 meq/L (ref 3.5–5.1)
Sodium: 141 mEq/L (ref 135–145)
Total Bilirubin: 0.5 mg/dL (ref 0.3–1.2)
Total Protein: 7.3 g/dL (ref 6.0–8.3)

## 2013-12-15 LAB — CBC WITH DIFFERENTIAL/PLATELET
BASOS ABS: 0.1 10*3/uL (ref 0.0–0.1)
Basophils Relative: 0.7 % (ref 0.0–3.0)
EOS ABS: 0.1 10*3/uL (ref 0.0–0.7)
Eosinophils Relative: 1.6 % (ref 0.0–5.0)
HCT: 43.4 % (ref 36.0–46.0)
Hemoglobin: 14.8 g/dL (ref 12.0–15.0)
Lymphocytes Relative: 32.9 % (ref 12.0–46.0)
Lymphs Abs: 2.9 10*3/uL (ref 0.7–4.0)
MCHC: 34.2 g/dL (ref 30.0–36.0)
MCV: 92.8 fl (ref 78.0–100.0)
Monocytes Absolute: 0.5 10*3/uL (ref 0.1–1.0)
Monocytes Relative: 5.5 % (ref 3.0–12.0)
Neutro Abs: 5.3 10*3/uL (ref 1.4–7.7)
Neutrophils Relative %: 59.3 % (ref 43.0–77.0)
PLATELETS: 270 10*3/uL (ref 150.0–400.0)
RBC: 4.68 Mil/uL (ref 3.87–5.11)
RDW: 14.8 % — ABNORMAL HIGH (ref 11.5–14.6)
WBC: 9 10*3/uL (ref 4.5–10.5)

## 2013-12-15 MED ORDER — PANTOPRAZOLE SODIUM 40 MG PO TBEC: 40.0000 mg | DELAYED_RELEASE_TABLET | Freq: Every day | ORAL | Status: DC

## 2013-12-15 MED ORDER — ATORVASTATIN CALCIUM 20 MG PO TABS: 20.0000 mg | ORAL_TABLET | Freq: Every morning | ORAL | Status: DC

## 2013-12-15 MED ORDER — LISINOPRIL 20 MG PO TABS: 20.0000 mg | ORAL_TABLET | Freq: Two times a day (BID) | ORAL | Status: DC

## 2013-12-15 MED ORDER — METFORMIN HCL 500 MG PO TABS: ORAL_TABLET | ORAL | Status: DC

## 2013-12-15 NOTE — Progress Notes (Signed)
Pre-visit discussion using our clinic review tool. No additional management support is needed unless otherwise documented below in the visit note.  

## 2013-12-15 NOTE — Progress Notes (Signed)
Subjective:    Patient ID: [name], female    DOB: [birth year], 80 y.o.   MRN: [id number]  HPI 80 year old patient who is seen today for followup of type 2 diabetes. She was hospitalized 2 months ago for gallstone pancreatitis and has done quite well since his hospital admission. She has diabetes hypertension and dyslipidemia. No concerns or complaints.  Discharge Diagnoses:  Active Problems:  DIABETES MELLITUS, TYPE II  HYPERTENSION  Tobacco abuse  Pancreatitis  Atypical chest pain  Past Medical History  Diagnosis Date  . Diabetes mellitus, type 2   . Hyperlipidemia   . Hypertension   . History of cervical cancer   . TN (trigeminal neuralgia)   . Raynaud's phenomenon     History   Social History  . Marital Status: Married    Spouse Name: [name]    Number of Children: 1  . Years of Education: N/A   Occupational History  . Unemployed    Social History Main Topics  . Smoking status: Current Every Day Smoker -- 1.50 packs/day  . Smokeless tobacco: Never Used  . Alcohol Use: No  . Drug Use: No  . Sexual Activity: Not on file   Other Topics Concern  . Not on file   Social History Narrative   Married.  Lives in [place] with her husband.  No FH of heart disease.    Past Surgical History  Procedure Laterality Date  . Breast biopsy    . Breast enhancement surgery    . Total abdominal hysterectomy  1963    for cervical cancer in situ    History reviewed. No pertinent family history.  No Known Allergies  Current Outpatient Prescriptions on File Prior to Visit  Medication Sig Dispense Refill  . aspirin EC 81 MG tablet Take 81 mg by mouth at bedtime.      . metFORMIN (GLUCOPHAGE) 500 MG tablet TAKE 1 TABLET BY MOUTH TWICE DAILY WITH A MEAL  180 tablet  1  . Multiple Vitamins-Minerals (WOMENS 50+ MULTI VITAMIN/MIN PO) Take 1 tablet by mouth every morning.        No current facility-administered medications on file prior to visit.    BP 120/80  Pulse  87  Temp(Src) 98.1 F (36.7 C) (Oral)  Resp 18  Ht 4\' 9"  (1.448 m)  Wt 104 lb (47.174 kg)  BMI 22.50 kg/m2  SpO2 97%      Review of Systems  Constitutional: Negative.   HENT: Negative for congestion, dental problem, hearing loss, rhinorrhea, sinus pressure, sore throat and tinnitus.   Eyes: Negative for pain, discharge and visual disturbance.  Respiratory: Negative for cough and shortness of breath.   Cardiovascular: Negative for chest pain, palpitations and leg swelling.  Gastrointestinal: Negative for nausea, vomiting, abdominal pain, diarrhea, constipation, blood in stool and abdominal distention.  Genitourinary: Negative for dysuria, urgency, frequency, hematuria, flank pain, vaginal bleeding, vaginal discharge, difficulty urinating, vaginal pain and pelvic pain.  Musculoskeletal: Negative for arthralgias, gait problem and joint swelling.  Skin: Negative for rash.  Neurological: Negative for dizziness, syncope, speech difficulty, weakness, numbness and headaches.  Hematological: Negative for adenopathy.  Psychiatric/Behavioral: Negative for behavioral problems, dysphoric mood and agitation. The patient is not nervous/anxious.        Objective:   Physical Exam  Constitutional: She is oriented to person, place, and time. She appears well-developed and well-nourished.  HENT:  Head: Normocephalic.  Right Ear: External ear normal.  Left Ear: External ear normal.  Mouth/Throat: Oropharynx is clear and moist.  Eyes: Conjunctivae and EOM are normal. Pupils are equal, round, and reactive to light.  Neck: Normal range of motion. Neck supple. No thyromegaly present.  Cardiovascular: Normal rate, regular rhythm, normal heart sounds and intact distal pulses.   Pulmonary/Chest: Effort normal and breath sounds normal.  Abdominal: Soft. Bowel sounds are normal. She exhibits no mass. There is no tenderness.  Musculoskeletal: Normal range of motion.  Lymphadenopathy:    She has no  cervical adenopathy.  Neurological: She is alert and oriented to person, place, and time.  Skin: Skin is warm and dry. No rash noted.  Psychiatric: She has a normal mood and affect. Her behavior is normal.          Assessment & Plan:   Diabetes -stable HTN Dyslipidemia  Gallstone pancreatitis.  Gen surgical consult.  Check LFTs; asymptomatic.  Benefits of elective cholecystectomy discussed

## 2013-12-15 NOTE — Patient Instructions (Signed)
Limit your sodium (Salt) intake  General surgical evaluation as discussed in   Please check your hemoglobin A1c every 3 months

## 2013-12-17 ENCOUNTER — Other Ambulatory Visit (HOSPITAL_COMMUNITY): Payer: Self-pay | Admitting: *Deleted

## 2013-12-17 ENCOUNTER — Telehealth: Payer: Self-pay

## 2013-12-17 DIAGNOSIS — I6529 Occlusion and stenosis of unspecified carotid artery: Secondary | ICD-10-CM

## 2013-12-17 NOTE — Telephone Encounter (Signed)
Relevant patient education mailed to patient.  

## 2013-12-23 ENCOUNTER — Ambulatory Visit (HOSPITAL_COMMUNITY): Payer: Medicare Other | Attending: Cardiology

## 2013-12-23 DIAGNOSIS — F17200 Nicotine dependence, unspecified, uncomplicated: Secondary | ICD-10-CM | POA: Diagnosis not present

## 2013-12-23 DIAGNOSIS — E119 Type 2 diabetes mellitus without complications: Secondary | ICD-10-CM | POA: Insufficient documentation

## 2013-12-23 DIAGNOSIS — I658 Occlusion and stenosis of other precerebral arteries: Secondary | ICD-10-CM | POA: Insufficient documentation

## 2013-12-23 DIAGNOSIS — I6529 Occlusion and stenosis of unspecified carotid artery: Secondary | ICD-10-CM | POA: Diagnosis not present

## 2013-12-23 DIAGNOSIS — I10 Essential (primary) hypertension: Secondary | ICD-10-CM | POA: Insufficient documentation

## 2013-12-23 DIAGNOSIS — E785 Hyperlipidemia, unspecified: Secondary | ICD-10-CM | POA: Insufficient documentation

## 2013-12-27 ENCOUNTER — Ambulatory Visit (INDEPENDENT_AMBULATORY_CARE_PROVIDER_SITE_OTHER): Payer: Medicare Other | Admitting: Surgery

## 2013-12-28 ENCOUNTER — Telehealth: Payer: Self-pay | Admitting: Internal Medicine

## 2013-12-28 NOTE — Telephone Encounter (Signed)
Relevant patient education mailed to patient.  

## 2013-12-29 ENCOUNTER — Telehealth: Payer: Self-pay | Admitting: Internal Medicine

## 2013-12-29 NOTE — Telephone Encounter (Signed)
Relevant patient education mailed to patient.  

## 2013-12-31 IMAGING — CT CT ABD-PELV W/ CM
1 of 3 series · 13 of 32 positions shown, 18 images · IV contrast (OMNIPAQUE 300)
Comparison: Abdominal ultrasound performed 10/07/2013

CLINICAL DATA: Mid abdominal pain.

EXAM:
CT ABDOMEN AND PELVIS WITH CONTRAST
TECHNIQUE: Multidetector CT imaging of the abdomen and pelvis was performed
using the standard protocol following bolus administration of
intravenous contrast.
CONTRAST:  80mL OMNIPAQUE IOHEXOL 300 MG/ML  SOLN

[Series 2: abd/pel with · axial · 0.74mm/px · z∈[+1240,+1600]mm · 13 of 82 slices shown, 18 images]
[im 5/82  soft-tissue]
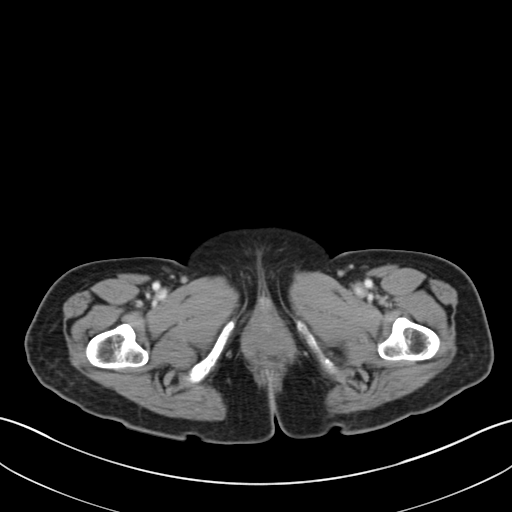
[im 5/82  bone]
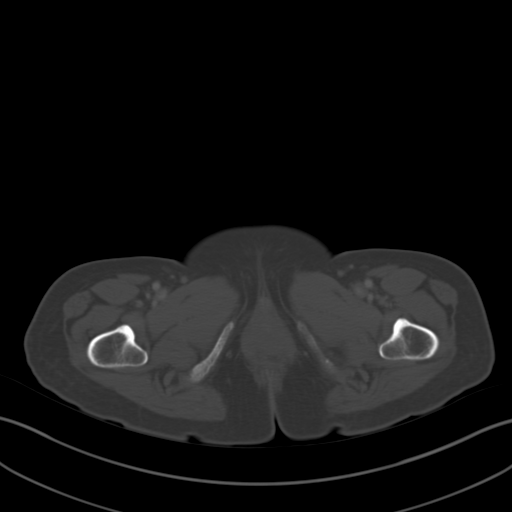
[im 14/82  soft-tissue]
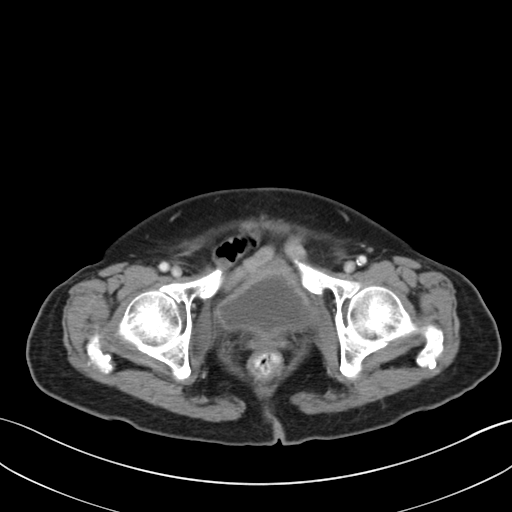
[im 19/82  soft-tissue]
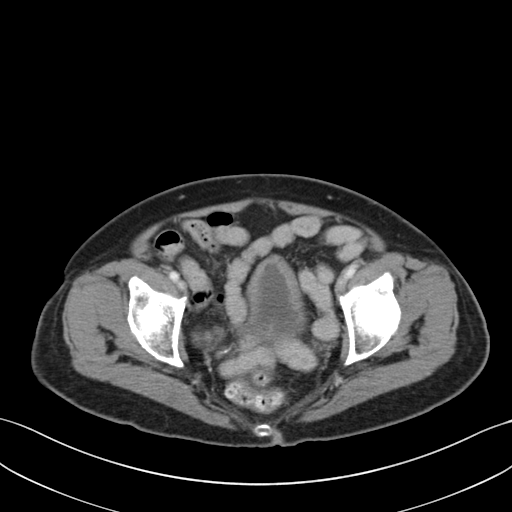
[im 23/82  soft-tissue]
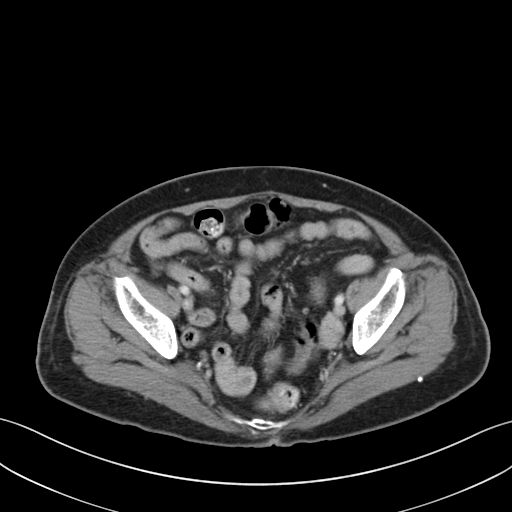
[im 32/82  soft-tissue]
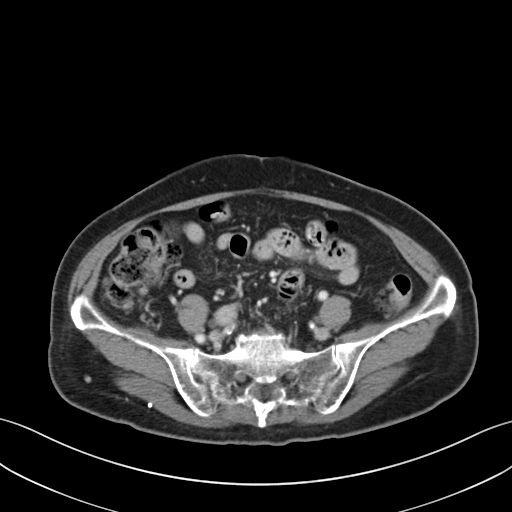
[im 37/82  soft-tissue]
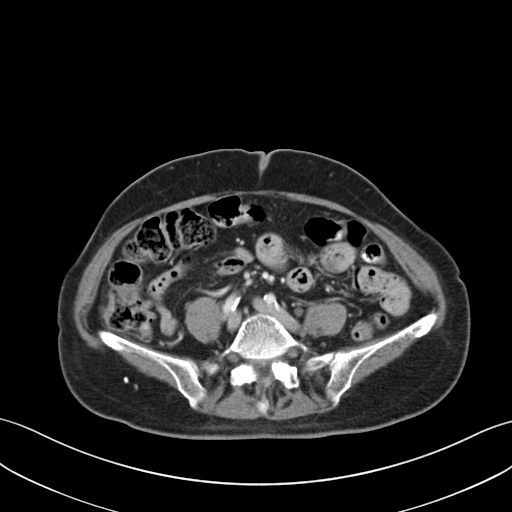
[im 46/82  soft-tissue]
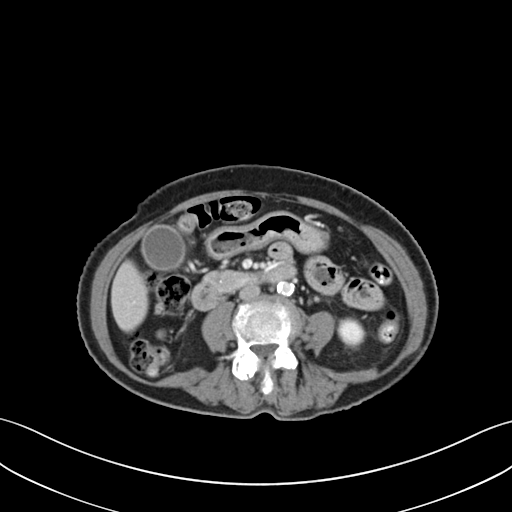
[im 50/82  soft-tissue]
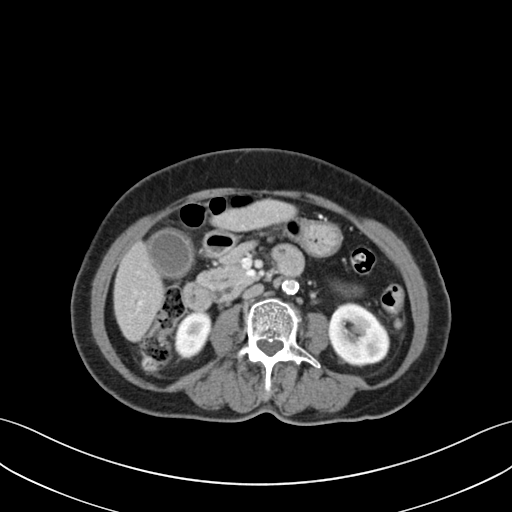
[im 59/82  soft-tissue]
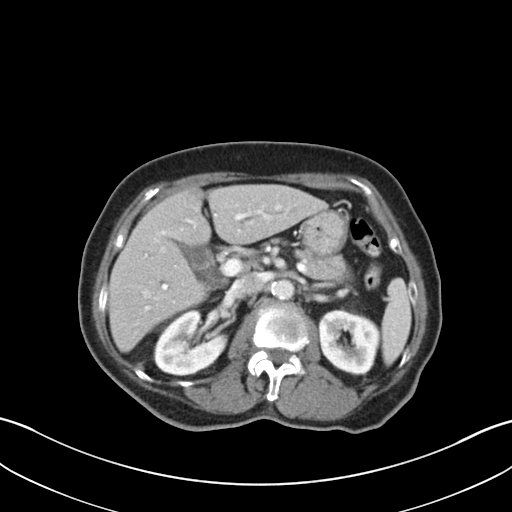
[im 59/82  bone]
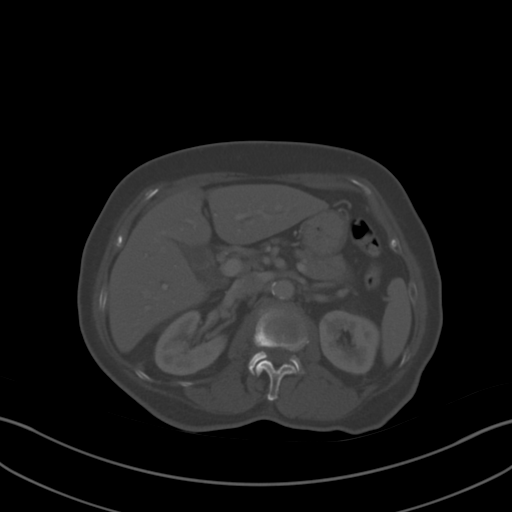
[im 64/82  soft-tissue]
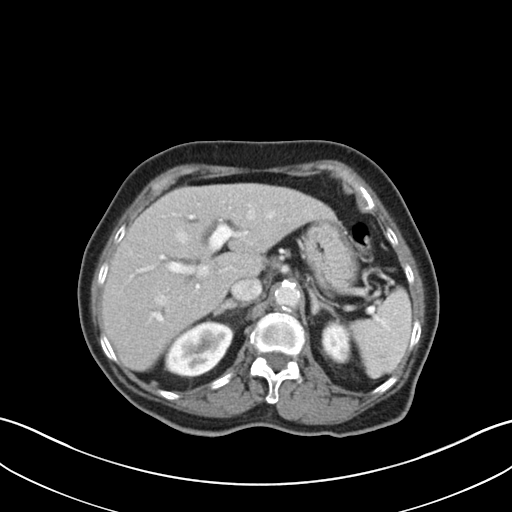
[im 64/82  lung]
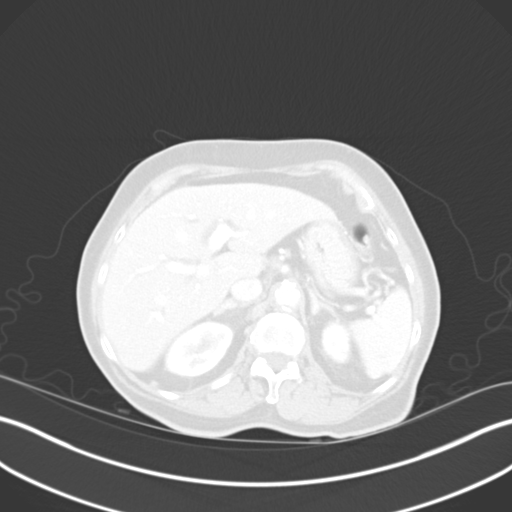
[im 68/82  soft-tissue]
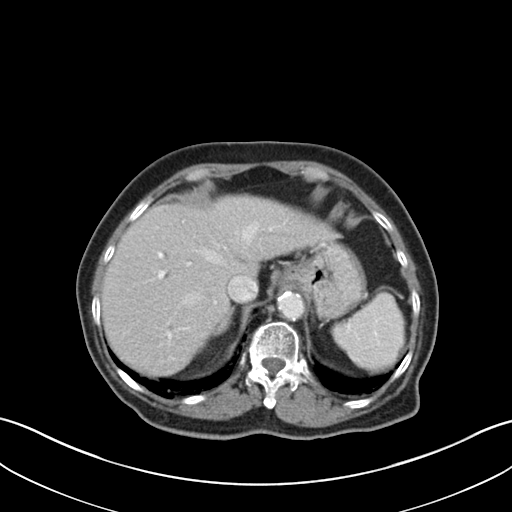
[im 68/82  lung]
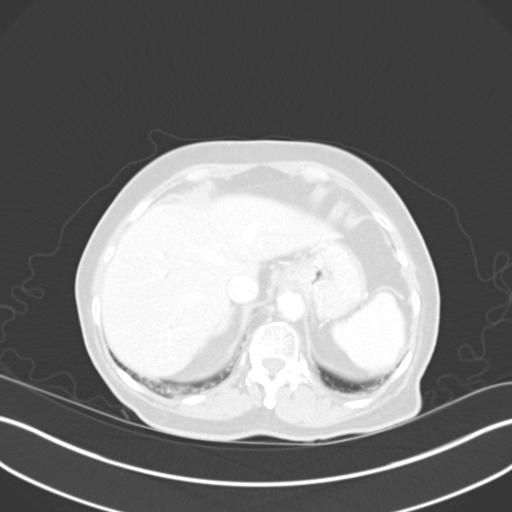
[im 73/82  lung]
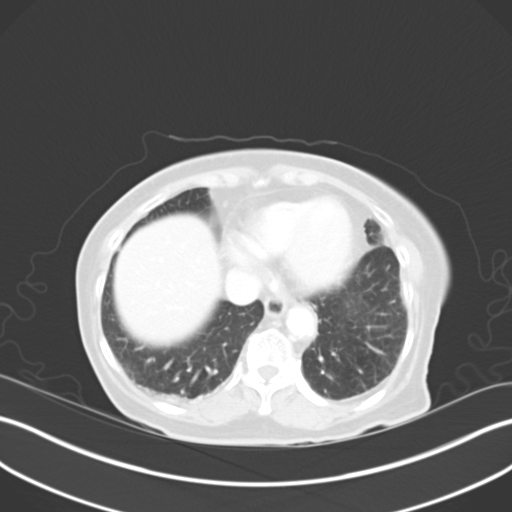
[im 77/82  soft-tissue]
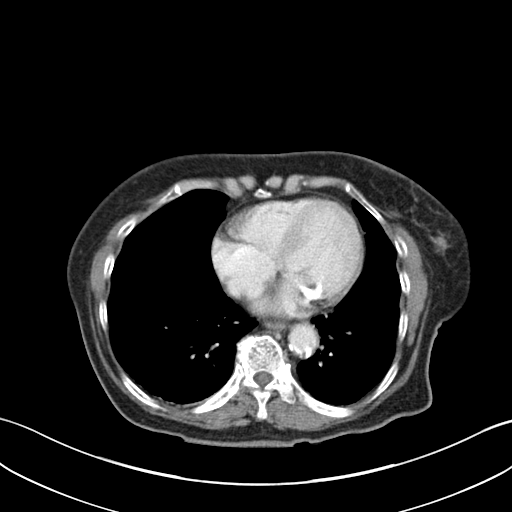
[im 77/82  lung]
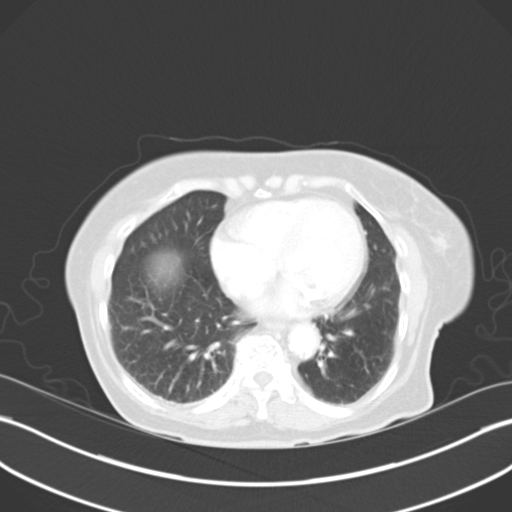

[13 of 32 positions shown; findings below may reference images not displayed]

FINDINGS: An apparent 4 mm nodule is noted at the right lung base (image 4 of
29). Dense calcification is noted at the mitral valve.

Intrahepatic biliary ductal dilatation is noted, apparently largely
new from the recent prior ultrasound. The liver is otherwise
unremarkable in appearance. The gallbladder is diffusely distended,
with pericholecystic fluid and gallbladder wall thickening, and mild
associated soft tissue inflammation. A few stones are seen lodged at
the neck of the gallbladder, the largest of which measures 7 mm in
size.

The spleen is grossly unremarkable in appearance. The pancreas is
within normal limits, with the pancreatic duct borderline normal in
size. The adrenal glands are normal in appearance.

Minimal nonspecific perinephric stranding is noted bilaterally. The
kidneys are otherwise unremarkable in appearance. There is no
evidence of hydronephrosis. No renal or ureteral stones are seen.

No free fluid is identified. The small bowel is unremarkable in
appearance. The stomach is within normal limits. No acute vascular
abnormalities are seen. Relatively diffuse calcification is noted
along the abdominal aorta and its branches.

The appendix is normal in caliber and contains air, without evidence
for appendicitis. Diverticulosis is noted along the ascending,
descending and proximal sigmoid colon, without evidence of
diverticulitis.

The bladder is mildly distended. Anterior bladder wall thickening
may reflect chronic inflammation, though an underlying mass cannot
be entirely excluded. Cystoscopy could be considered for further
evaluation. A 3.4 cm cystic focus at the right pelvic sidewall
appears to reflect a bladder diverticulum. The patient is status
post hysterectomy. No suspicious adnexal masses are seen. No
inguinal lymphadenopathy is seen.

No acute osseous abnormalities are identified.
IMPRESSION: 1. Acute cholecystitis, with dilatation of the intrahepatic biliary
ducts, apparently largely new from the recent prior ultrasound.
Gallbladder distention, with pericholecystic fluid and gallbladder
wall thickening, and mild associated soft tissue inflammation. Few
stones now seen lodged at the neck of the gallbladder, measuring up
to 7 mm in size.
2. Relatively diffuse calcification along the abdominal aorta and
its branches.
3. Diverticulosis along the ascending, descending and proximal
sigmoid colon, without evidence of diverticulitis. Anterior bladder
wall thickening may reflect chronic inflammation, though an
underlying mass cannot be entirely excluded. Cystoscopy could be
considered for further evaluation, when and as deemed clinically
appropriate.
4. Right-sided bladder diverticulum incidentally noted.
5. 4 mm nodule noted at the right lung base.
6. Dense calcification of the mitral valve.

## 2014-01-20 ENCOUNTER — Ambulatory Visit (INDEPENDENT_AMBULATORY_CARE_PROVIDER_SITE_OTHER): Payer: BLUE CROSS/BLUE SHIELD | Admitting: General Surgery

## 2014-01-26 ENCOUNTER — Other Ambulatory Visit: Payer: Self-pay | Admitting: Internal Medicine

## 2014-01-26 ENCOUNTER — Encounter (INDEPENDENT_AMBULATORY_CARE_PROVIDER_SITE_OTHER): Payer: Self-pay | Admitting: General Surgery

## 2014-01-26 ENCOUNTER — Telehealth: Payer: Self-pay | Admitting: Internal Medicine

## 2014-01-26 ENCOUNTER — Ambulatory Visit (INDEPENDENT_AMBULATORY_CARE_PROVIDER_SITE_OTHER): Payer: Medicare Other | Admitting: General Surgery

## 2014-01-26 VITALS — BP 118/80 | HR 71 | Temp 97.8°F | Resp 16 | Ht 60.0 in | Wt 106.0 lb

## 2014-01-26 DIAGNOSIS — K8020 Calculus of gallbladder without cholecystitis without obstruction: Secondary | ICD-10-CM

## 2014-01-26 NOTE — Progress Notes (Signed)
Patient ID: [name], female   DOB: Oct 08, 1934, 80 y.o.   MRN: [id number]  No chief complaint on file.   HPI [name] is a 80 y.o. female.  The patient is a 80 year old female who is referred by Dr. Burnice Logan for evaluation of a previous history of biliary pancreatitis as well as acute cholecystitis back in November 2014. Patient followed back up today with a recent episode of right upper quadrant pain after eating a cheeseburger and fries. Patient states the pain is very similar to what initially was back in November. At that time patient signed out AMA, but was diagnosed with biliary pancreatitis an ultrasound which revealed acute cholecystitis.   This patient and her case was discussed via an interpreter as the patient speaks [place].  HPI  Past Medical History  Diagnosis Date  . Diabetes mellitus, type 2   . Hyperlipidemia   . Hypertension   . History of cervical cancer   . TN (trigeminal neuralgia)   . Raynaud's phenomenon     Past Surgical History  Procedure Laterality Date  . Breast biopsy    . Breast enhancement surgery    . Total abdominal hysterectomy  1963    for cervical cancer in situ    No family history on file.  Social History History  Substance Use Topics  . Smoking status: Current Every Day Smoker -- 1.50 packs/day  . Smokeless tobacco: Never Used  . Alcohol Use: No    No Known Allergies  Current Outpatient Prescriptions  Medication Sig Dispense Refill  . aspirin EC 81 MG tablet Take 81 mg by mouth at bedtime.      [name] atorvastatin (LIPITOR) 20 MG tablet Take 1 tablet (20 mg total) by mouth every morning.  90 tablet  3  . lisinopril (PRINIVIL,ZESTRIL) 20 MG tablet Take 1 tablet (20 mg total) by mouth 2 (two) times daily.  90 tablet  6  . metFORMIN (GLUCOPHAGE) 500 MG tablet TAKE 1 TABLET BY MOUTH TWICE DAILY WITH A MEAL  180 tablet  1  . Multiple Vitamins-Minerals (WOMENS 50+ MULTI VITAMIN/MIN PO) Take 1 tablet by mouth every morning.        . pantoprazole (PROTONIX) 40 MG tablet Take 1 tablet (40 mg total) by mouth daily.  90 tablet  3   No current facility-administered medications for this visit.    Review of Systems Review of Systems  Constitutional: Negative.   HENT: Negative.   Respiratory: Negative.   Cardiovascular: Negative.   Gastrointestinal: Positive for abdominal pain.  Neurological: Negative.   All other systems reviewed and are negative.    Blood pressure 118/80, pulse 71, temperature 97.8 F (36.6 C), temperature source Temporal, resp. rate 16, height 5' (1.524 m), weight 106 lb (48.081 kg).  Physical Exam Physical Exam  Constitutional: She is oriented to person, place, and time. She appears well-developed and well-nourished.  HENT:  Head: Normocephalic and atraumatic.  Eyes: Conjunctivae and EOM are normal. Pupils are equal, round, and reactive to light.  Neck: Normal range of motion. Neck supple.  Cardiovascular: Normal rate, regular rhythm and normal heart sounds.   Pulmonary/Chest: Effort normal and breath sounds normal.  Abdominal: Soft. Bowel sounds are normal. She exhibits no distension and no mass. There is tenderness in the right upper quadrant. There is no rebound and no guarding.  Musculoskeletal: Normal range of motion.  Neurological: She is alert and oriented to person, place, and time.  Skin: Skin is warm and dry.  Psychiatric: She  has a normal mood and affect.    Data Reviewed Ultrasound revealed gallstones  Assessment    80 year old female with symptomatic cholelithiasis, chronic cholecystitis     Plan    1. We'll proceed to the operating room for a laparoscopic cholecystectomy 2.. All risks and benefits were discussed with the patient to generally include: infection, bleeding, possible need for post op ERCP, damage to the bile ducts, and bile leak. Alternatives were offered and described.  All questions were answered and the patient voiced understanding of the procedure  and wishes to proceed at this point with a laparoscopic cholecystectomy         Rosario Jacks., Verneda Hollopeter 01/26/2014, 10:14 AM

## 2014-01-26 NOTE — Telephone Encounter (Signed)
Relevant patient education mailed to patient.  

## 2014-02-04 ENCOUNTER — Encounter (HOSPITAL_COMMUNITY): Payer: Self-pay

## 2014-02-04 ENCOUNTER — Encounter (HOSPITAL_COMMUNITY)
Admission: RE | Admit: 2014-02-04 | Discharge: 2014-02-04 | Disposition: A | Discharge status: Home / Self Care | Payer: Medicare Other | Source: Ambulatory Visit | Attending: Anesthesiology | Admitting: Anesthesiology

## 2014-02-04 ENCOUNTER — Encounter (HOSPITAL_COMMUNITY)
Admission: RE | Admit: 2014-02-04 | Discharge: 2014-02-04 | Disposition: A | Discharge status: Home / Self Care | Payer: Medicare Other | Source: Ambulatory Visit | Attending: General Surgery | Admitting: General Surgery

## 2014-02-04 DIAGNOSIS — I10 Essential (primary) hypertension: Secondary | ICD-10-CM | POA: Diagnosis not present

## 2014-02-04 DIAGNOSIS — Z01812 Encounter for preprocedural laboratory examination: Secondary | ICD-10-CM | POA: Insufficient documentation

## 2014-02-04 DIAGNOSIS — J449 Chronic obstructive pulmonary disease, unspecified: Secondary | ICD-10-CM | POA: Diagnosis not present

## 2014-02-04 DIAGNOSIS — Z01818 Encounter for other preprocedural examination: Secondary | ICD-10-CM | POA: Diagnosis not present

## 2014-02-04 LAB — COMPREHENSIVE METABOLIC PANEL
ALBUMIN: 3.7 g/dL (ref 3.5–5.2)
ALT: 14 U/L (ref 0–35)
AST: 23 U/L (ref 0–37)
Alkaline Phosphatase: 68 U/L (ref 39–117)
BILIRUBIN TOTAL: 0.4 mg/dL (ref 0.3–1.2)
BUN: 15 mg/dL (ref 6–23)
CHLORIDE: 103 meq/L (ref 96–112)
CO2: 23 mEq/L (ref 19–32)
Calcium: 9.7 mg/dL (ref 8.4–10.5)
Creatinine, Ser: 0.71 mg/dL (ref 0.50–1.10)
GFR calc Af Amer: >90 mL/min (ref >90)
GFR calc non Af Amer: 80 mL/min — ABNORMAL LOW (ref >90)
Glucose, Bld: 73 mg/dL (ref 70–99)
POTASSIUM: 4.4 meq/L (ref 3.7–5.3)
SODIUM: 142 meq/L (ref 137–147)
Total Protein: 7 g/dL (ref 6.0–8.3)

## 2014-02-04 LAB — CBC
HCT: 45.1 % (ref 36.0–46.0)
HEMOGLOBIN: 15.5 g/dL — AB (ref 12.0–15.0)
MCH: 32 pg (ref 26.0–34.0)
MCHC: 34.4 g/dL (ref 30.0–36.0)
MCV: 93.2 fL (ref 78.0–100.0)
PLATELETS: 263 10*3/uL (ref 150–400)
RBC: 4.84 MIL/uL (ref 3.87–5.11)
RDW: 13.4 % (ref 11.5–15.5)
WBC: 6.9 10*3/uL (ref 4.0–10.5)

## 2014-02-04 MED ORDER — CHLORHEXIDINE GLUCONATE 4 % EX LIQD: 1.0000 "application " | Freq: Once | CUTANEOUS | Status: DC

## 2014-02-04 NOTE — Pre-Procedure Instructions (Signed)
[name]  02/04/2014   Your procedure is scheduled on:  Monday, March 23.  Report to [facility]/ Entrance "A" at 5:30 AM.  Call this number if you have problems the morning of surgery: [phone]   Remember:   Do not eat food or drink liquids after midnight Sunday.    Take these medicines the morning of surgery with A SIP OF WATER: pantoprazole (PROTONIX).    Do not wear jewelry, make-up or nail polish.  Do not wear lotions, powders, or perfumes.   Do not shave 48 hours prior to surgery.   Do not bring valuables to the hospital.  [facility] is not responsible for any belongings or valuables.               Contacts, dentures or bridgework may not be worn into surgery.  Leave suitcase in the car. After surgery it may be brought to your room.  For patients admitted to the hospital, discharge time is determined by your treatment team.               Patients discharged the day of surgery will not be allowed to drive home.  Name and phone number of your driver: -   Special Instructions: -   Please read over the following fact sheets that you were given: Pain Booklet, Coughing and Deep Breathing and Surgical Site Infection Prevention

## 2014-02-13 MED ORDER — CEFAZOLIN SODIUM-DEXTROSE 2-3 GM-% IV SOLR
2.0000 g | INTRAVENOUS | Status: AC
  Administered 2014-02-14: 2 g via INTRAVENOUS
  Filled 2014-02-13: qty 50

## 2014-02-14 ENCOUNTER — Encounter (HOSPITAL_COMMUNITY)
Admission: RE | Disposition: A | Discharge status: Home / Self Care | Payer: Self-pay | Source: Ambulatory Visit | Attending: General Surgery

## 2014-02-14 ENCOUNTER — Ambulatory Visit (HOSPITAL_COMMUNITY): Payer: Medicare Other | Admitting: Anesthesiology

## 2014-02-14 ENCOUNTER — Encounter (HOSPITAL_COMMUNITY): Payer: Medicare Other | Admitting: Anesthesiology

## 2014-02-14 ENCOUNTER — Ambulatory Visit (HOSPITAL_COMMUNITY)
Admission: RE | Admit: 2014-02-14 | Discharge: 2014-02-14 | Disposition: A | Discharge status: Home / Self Care | Payer: Medicare Other | Source: Ambulatory Visit | Attending: General Surgery | Admitting: General Surgery

## 2014-02-14 ENCOUNTER — Encounter (HOSPITAL_COMMUNITY): Payer: Self-pay | Admitting: Anesthesiology

## 2014-02-14 DIAGNOSIS — Z7982 Long term (current) use of aspirin: Secondary | ICD-10-CM | POA: Diagnosis not present

## 2014-02-14 DIAGNOSIS — Z8541 Personal history of malignant neoplasm of cervix uteri: Secondary | ICD-10-CM | POA: Diagnosis not present

## 2014-02-14 DIAGNOSIS — I10 Essential (primary) hypertension: Secondary | ICD-10-CM | POA: Diagnosis not present

## 2014-02-14 DIAGNOSIS — K8020 Calculus of gallbladder without cholecystitis without obstruction: Secondary | ICD-10-CM | POA: Insufficient documentation

## 2014-02-14 DIAGNOSIS — I739 Peripheral vascular disease, unspecified: Secondary | ICD-10-CM | POA: Insufficient documentation

## 2014-02-14 DIAGNOSIS — E785 Hyperlipidemia, unspecified: Secondary | ICD-10-CM | POA: Insufficient documentation

## 2014-02-14 DIAGNOSIS — F17200 Nicotine dependence, unspecified, uncomplicated: Secondary | ICD-10-CM | POA: Diagnosis not present

## 2014-02-14 DIAGNOSIS — K8010 Calculus of gallbladder with chronic cholecystitis without obstruction: Secondary | ICD-10-CM | POA: Diagnosis not present

## 2014-02-14 DIAGNOSIS — G500 Trigeminal neuralgia: Secondary | ICD-10-CM | POA: Insufficient documentation

## 2014-02-14 DIAGNOSIS — I7300 Raynaud's syndrome without gangrene: Secondary | ICD-10-CM | POA: Insufficient documentation

## 2014-02-14 DIAGNOSIS — E119 Type 2 diabetes mellitus without complications: Secondary | ICD-10-CM | POA: Diagnosis not present

## 2014-02-14 HISTORY — PX: CHOLECYSTECTOMY: SHX55

## 2014-02-14 LAB — GLUCOSE, CAPILLARY
Glucose-Capillary: 81 mg/dL (ref 70–99)
Glucose-Capillary: 111 mg/dL — ABNORMAL HIGH (ref 70–99)

## 2014-02-14 SURGERY — LAPAROSCOPIC CHOLECYSTECTOMY
Anesthesia: General | Site: Abdomen

## 2014-02-14 MED ORDER — OXYCODONE HCL 5 MG PO TABS: 5.0000 mg | ORAL_TABLET | Freq: Once | ORAL | Status: DC | PRN

## 2014-02-14 MED ORDER — 0.9 % SODIUM CHLORIDE (POUR BTL) OPTIME
TOPICAL | Status: DC | PRN
  Administered 2014-02-14: 1000 mL

## 2014-02-14 MED ORDER — LIDOCAINE HCL (CARDIAC) 20 MG/ML IV SOLN
INTRAVENOUS | Status: AC
  Filled 2014-02-14: qty 5

## 2014-02-14 MED ORDER — HYDROMORPHONE HCL PF 1 MG/ML IJ SOLN
INTRAMUSCULAR | Status: AC
  Filled 2014-02-14: qty 1

## 2014-02-14 MED ORDER — OXYCODONE-ACETAMINOPHEN 5-325 MG PO TABS: 1.0000 | ORAL_TABLET | ORAL | Status: DC | PRN

## 2014-02-14 MED ORDER — SODIUM CHLORIDE 0.9 % IR SOLN
Status: DC | PRN
  Administered 2014-02-14: 1000 mL

## 2014-02-14 MED ORDER — GLYCOPYRROLATE 0.2 MG/ML IJ SOLN
INTRAMUSCULAR | Status: AC
  Filled 2014-02-14: qty 1

## 2014-02-14 MED ORDER — GLYCOPYRROLATE 0.2 MG/ML IJ SOLN
INTRAMUSCULAR | Status: AC
  Filled 2014-02-14: qty 2

## 2014-02-14 MED ORDER — GLYCOPYRROLATE 0.2 MG/ML IJ SOLN
INTRAMUSCULAR | Status: DC | PRN
  Administered 2014-02-14: 0.1 mg via INTRAVENOUS
  Administered 2014-02-14: .5 mg via INTRAVENOUS

## 2014-02-14 MED ORDER — HYDROMORPHONE HCL PF 1 MG/ML IJ SOLN
0.2500 mg | INTRAMUSCULAR | Status: DC | PRN
  Administered 2014-02-14: 0.25 mg via INTRAVENOUS
  Administered 2014-02-14: 0.5 mg via INTRAVENOUS
  Administered 2014-02-14: 0.25 mg via INTRAVENOUS

## 2014-02-14 MED ORDER — NEOSTIGMINE METHYLSULFATE 1 MG/ML IJ SOLN
INTRAMUSCULAR | Status: AC
  Filled 2014-02-14: qty 10

## 2014-02-14 MED ORDER — BUPIVACAINE HCL (PF) 0.25 % IJ SOLN
INTRAMUSCULAR | Status: AC
  Filled 2014-02-14: qty 30

## 2014-02-14 MED ORDER — ROCURONIUM BROMIDE 50 MG/5ML IV SOLN
INTRAVENOUS | Status: AC
  Filled 2014-02-14: qty 1

## 2014-02-14 MED ORDER — PROMETHAZINE HCL 25 MG/ML IJ SOLN
INTRAMUSCULAR | Status: DC
Start: 2014-02-14 — End: 2014-02-14
  Filled 2014-02-14: qty 1

## 2014-02-14 MED ORDER — ROCURONIUM BROMIDE 100 MG/10ML IV SOLN
INTRAVENOUS | Status: DC | PRN
  Administered 2014-02-14: 30 mg via INTRAVENOUS

## 2014-02-14 MED ORDER — PROPOFOL 10 MG/ML IV BOLUS
INTRAVENOUS | Status: AC
  Filled 2014-02-14: qty 20

## 2014-02-14 MED ORDER — FENTANYL CITRATE 0.05 MG/ML IJ SOLN
INTRAMUSCULAR | Status: AC
  Filled 2014-02-14: qty 5

## 2014-02-14 MED ORDER — ONDANSETRON HCL 4 MG/2ML IJ SOLN
INTRAMUSCULAR | Status: DC | PRN
  Administered 2014-02-14: 4 mg via INTRAVENOUS

## 2014-02-14 MED ORDER — PROMETHAZINE HCL 25 MG/ML IJ SOLN
6.2500 mg | INTRAMUSCULAR | Status: DC | PRN
  Administered 2014-02-14: 6.25 mg via INTRAVENOUS

## 2014-02-14 MED ORDER — FENTANYL CITRATE 0.05 MG/ML IJ SOLN
INTRAMUSCULAR | Status: DC | PRN
  Administered 2014-02-14: 100 ug via INTRAVENOUS
  Administered 2014-02-14: 50 ug via INTRAVENOUS

## 2014-02-14 MED ORDER — NEOSTIGMINE METHYLSULFATE 1 MG/ML IJ SOLN
INTRAMUSCULAR | Status: DC | PRN
  Administered 2014-02-14: 1 mg via INTRAVENOUS
  Administered 2014-02-14: 2.5 mg via INTRAVENOUS

## 2014-02-14 MED ORDER — OXYCODONE HCL 5 MG/5ML PO SOLN: 5.0000 mg | Freq: Once | ORAL | Status: DC | PRN

## 2014-02-14 MED ORDER — PHENYLEPHRINE 40 MCG/ML (10ML) SYRINGE FOR IV PUSH (FOR BLOOD PRESSURE SUPPORT)
PREFILLED_SYRINGE | INTRAVENOUS | Status: AC
Start: 2014-02-14 — End: 2014-02-14
  Filled 2014-02-14: qty 10

## 2014-02-14 MED ORDER — LACTATED RINGERS IV SOLN
INTRAVENOUS | Status: DC | PRN
  Administered 2014-02-14: 07:00:00 via INTRAVENOUS

## 2014-02-14 MED ORDER — PROPOFOL 10 MG/ML IV BOLUS
INTRAVENOUS | Status: DC | PRN
  Administered 2014-02-14: 130 mg via INTRAVENOUS

## 2014-02-14 MED ORDER — LIDOCAINE HCL (CARDIAC) 20 MG/ML IV SOLN
INTRAVENOUS | Status: DC | PRN
  Administered 2014-02-14: 70 mg via INTRAVENOUS

## 2014-02-14 MED ORDER — ONDANSETRON HCL 4 MG/2ML IJ SOLN
INTRAMUSCULAR | Status: AC
  Filled 2014-02-14: qty 2

## 2014-02-14 MED ORDER — BUPIVACAINE HCL 0.25 % IJ SOLN
INTRAMUSCULAR | Status: DC | PRN
  Administered 2014-02-14: 6 mL

## 2014-02-14 SURGICAL SUPPLY — 47 items
BENZOIN TINCTURE PRP APPL 2/3 (GAUZE/BANDAGES/DRESSINGS) ×3 IMPLANT
CANISTER SUCTION 2500CC (MISCELLANEOUS) ×3 IMPLANT
CHLORAPREP W/TINT 26ML (MISCELLANEOUS) ×3 IMPLANT
CLIP LIGATING HEMO O LOK GREEN (MISCELLANEOUS) ×3 IMPLANT
CLOSURE WOUND 1/2 X4 (GAUZE/BANDAGES/DRESSINGS) ×1
COVER MAYO STAND STRL (DRAPES) IMPLANT
COVER SURGICAL LIGHT HANDLE (MISCELLANEOUS) ×3 IMPLANT
COVER TRANSDUCER ULTRASND (DRAPES) ×3 IMPLANT
DEVICE TROCAR PUNCTURE CLOSURE (ENDOMECHANICALS) ×3 IMPLANT
DRAPE C-ARM 42X72 X-RAY (DRAPES) IMPLANT
DRAPE UTILITY 15X26 W/TAPE STR (DRAPE) ×6 IMPLANT
ELECT REM PT RETURN 9FT ADLT (ELECTROSURGICAL) ×3
ELECTRODE REM PT RTRN 9FT ADLT (ELECTROSURGICAL) ×1 IMPLANT
GAUZE SPONGE 2X2 8PLY STRL LF (GAUZE/BANDAGES/DRESSINGS) ×1 IMPLANT
GLOVE BIO SURGEON STRL SZ7 (GLOVE) ×3 IMPLANT
GLOVE BIO SURGEON STRL SZ7.5 (GLOVE) ×6 IMPLANT
GLOVE BIOGEL PI IND STRL 6.5 (GLOVE) ×1 IMPLANT
GLOVE BIOGEL PI IND STRL 7.0 (GLOVE) ×1 IMPLANT
GLOVE BIOGEL PI IND STRL 7.5 (GLOVE) ×1 IMPLANT
GLOVE BIOGEL PI INDICATOR 6.5 (GLOVE) ×2
GLOVE BIOGEL PI INDICATOR 7.0 (GLOVE) ×2
GLOVE BIOGEL PI INDICATOR 7.5 (GLOVE) ×2
GOWN STRL REUS W/ TWL LRG LVL3 (GOWN DISPOSABLE) ×2 IMPLANT
GOWN STRL REUS W/ TWL XL LVL3 (GOWN DISPOSABLE) ×1 IMPLANT
GOWN STRL REUS W/TWL LRG LVL3 (GOWN DISPOSABLE) ×4
GOWN STRL REUS W/TWL XL LVL3 (GOWN DISPOSABLE) ×2
IV CATH 14GX2 1/4 (CATHETERS) IMPLANT
KIT BASIN OR (CUSTOM PROCEDURE TRAY) ×3 IMPLANT
KIT ROOM TURNOVER OR (KITS) ×3 IMPLANT
NEEDLE INSUFFLATION 14GA 120MM (NEEDLE) ×3 IMPLANT
NS IRRIG 1000ML POUR BTL (IV SOLUTION) ×3 IMPLANT
PAD ARMBOARD 7.5X6 YLW CONV (MISCELLANEOUS) ×6 IMPLANT
POUCH SPECIMEN RETRIEVAL 10MM (ENDOMECHANICALS) IMPLANT
SCISSORS LAP 5X35 DISP (ENDOMECHANICALS) ×3 IMPLANT
SET CHOLANGIOGRAPHY FRANKLIN (SET/KITS/TRAYS/PACK) IMPLANT
SET IRRIG TUBING LAPAROSCOPIC (IRRIGATION / IRRIGATOR) ×3 IMPLANT
SLEEVE ENDOPATH XCEL 5M (ENDOMECHANICALS) ×3 IMPLANT
SPECIMEN JAR SMALL (MISCELLANEOUS) ×3 IMPLANT
SPONGE GAUZE 2X2 STER 10/PKG (GAUZE/BANDAGES/DRESSINGS) ×2
STRIP CLOSURE SKIN 1/2X4 (GAUZE/BANDAGES/DRESSINGS) ×2 IMPLANT
SUT MNCRL AB 3-0 PS2 18 (SUTURE) ×3 IMPLANT
TAPE CLOTH SURG 4X10 WHT LF (GAUZE/BANDAGES/DRESSINGS) ×3 IMPLANT
TOWEL OR 17X24 6PK STRL BLUE (TOWEL DISPOSABLE) IMPLANT
TOWEL OR 17X26 10 PK STRL BLUE (TOWEL DISPOSABLE) ×3 IMPLANT
TRAY LAPAROSCOPIC (CUSTOM PROCEDURE TRAY) ×3 IMPLANT
TROCAR XCEL NON-BLD 11X100MML (ENDOMECHANICALS) ×3 IMPLANT
TROCAR XCEL NON-BLD 5MMX100MML (ENDOMECHANICALS) ×3 IMPLANT

## 2014-02-14 NOTE — Transfer of Care (Signed)
Immediate Anesthesia Transfer of Care Note  Patient: [name]  Procedure(s) Performed: Procedure(s): LAPAROSCOPIC CHOLECYSTECTOMY (N/A)  Patient Location: PACU  Anesthesia Type:General  Level of Consciousness: awake, alert  and oriented  Airway & Oxygen Therapy: Patient Spontanous Breathing and Patient connected to face mask oxygen  Post-op Assessment: Report given to PACU RN, Post -op Vital signs reviewed and stable and Patient moving all extremities X 4  Post vital signs: Reviewed and stable  Complications: No apparent anesthesia complications

## 2014-02-14 NOTE — Interval H&P Note (Signed)
History and Physical Interval Note:  02/14/2014 7:19 AM  [name]  has presented today for surgery, with the diagnosis of gallstones  The various methods of treatment have been discussed with the patient and family. After consideration of risks, benefits and other options for treatment, the patient has consented to  Procedure(s): LAPAROSCOPIC CHOLECYSTECTOMY (N/A) as a surgical intervention .  The patient's history has been reviewed, patient examined, no change in status, stable for surgery.  I have reviewed the patient's chart and labs.  Questions were answered to the patient's satisfaction.     Rosario Jacks., Anne Hahn

## 2014-02-14 NOTE — Preoperative (Signed)
Beta Blockers   Reason not to administer Beta Blockers:Not Applicable 

## 2014-02-14 NOTE — Discharge Instructions (Signed)
[facility] ______[facility] °LAPAROSCOPIC SURGERY: POST OP INSTRUCTIONS °Always review your discharge instruction sheet given to you by the facility where your surgery was performed. °IF YOU HAVE DISABILITY OR FAMILY LEAVE FORMS, YOU MUST BRING THEM TO THE OFFICE FOR PROCESSING.   °DO NOT GIVE THEM TO YOUR DOCTOR. ° °1. A prescription for pain medication may be given to you upon discharge.  Take your pain medication as prescribed, if needed.  If narcotic pain medicine is not needed, then you may take acetaminophen (Tylenol) or ibuprofen (Advil) as needed. °2. Take your usually prescribed medications unless otherwise directed. °3. If you need a refill on your pain medication, please contact your pharmacy.  They will contact our office to request authorization. Prescriptions will not be filled after 5pm or on week-ends. °4. You should follow a light diet the first few days after arrival home, such as soup and crackers, etc.  Be sure to include lots of fluids daily. °5. Most patients will experience some swelling and bruising in the area of the incisions.  Ice packs will help.  Swelling and bruising can take several days to resolve.  °6. It is common to experience some constipation if taking pain medication after surgery.  Increasing fluid intake and taking a stool softener (such as Colace) will usually help or prevent this problem from occurring.  A mild laxative (Milk of Magnesia or Miralax) should be taken according to package instructions if there are no bowel movements after 48 hours. °7. Unless discharge instructions indicate otherwise, you may remove your bandages 24-48 hours after surgery, and you may shower at that time.  You may have steri-strips (small skin tapes) in place directly over the incision.  These strips should be left on the skin for 7-10 days.  If your surgeon used skin glue on the incision, you may shower in 24 hours.  The glue will flake off over the next 2-3 weeks.  Any sutures or  staples will be removed at the office during your follow-up visit. °8. ACTIVITIES:  You may resume regular (light) daily activities beginning the next day--such as daily self-care, walking, climbing stairs--gradually increasing activities as tolerated.  You may have sexual intercourse when it is comfortable.  Refrain from any heavy lifting or straining until approved by your doctor. °a. You may drive when you are no longer taking prescription pain medication, you can comfortably wear a seatbelt, and you can safely maneuver your car and apply brakes. °b. RETURN TO WORK:  __________________________________________________________ °9. You should see your doctor in the office for a follow-up appointment approximately 2-3 weeks after your surgery.  Make sure that you call for this appointment within a day or two after you arrive home to insure a convenient appointment time. °10. OTHER INSTRUCTIONS: __________________________________________________________________________________________________________________________ __________________________________________________________________________________________________________________________ °WHEN TO CALL YOUR DOCTOR: °1. Fever over 101.0 °2. Inability to urinate °3. Continued bleeding from incision. °4. Increased pain, redness, or drainage from the incision. °5. Increasing abdominal pain ° °The clinic staff is available to answer your questions during regular business hours.  Please don’t hesitate to call and ask to speak to one of the nurses for clinical concerns.  If you have a medical emergency, go to the nearest emergency room or call 911.  A surgeon from [facility] is always on call at the hospital. °[address], [address], [place], [place]  [address] ? [address] [address], [place], [place]   [address] °[phone] ? [phone] ? FAX [phone] °Web site:   [email or web address] ° °What to eat: ° °For your first meals, you should eat  lightly; only small meals initially.  If you do not have nausea, you may eat larger meals.  Avoid spicy, greasy and heavy food.   ° °General Anesthesia, Adult, Care After  °Refer to this sheet in the next few weeks. These instructions provide you with information on caring for yourself after your procedure. Your health care provider may also give you more specific instructions. Your treatment has been planned according to current medical practices, but problems sometimes occur. Call your health care provider if you have any problems or questions after your procedure.  °WHAT TO EXPECT AFTER THE PROCEDURE  °After the procedure, it is typical to experience:  °Sleepiness.  °Nausea and vomiting. °HOME CARE INSTRUCTIONS  °For the first 24 hours after general anesthesia:  °Have a responsible person with you.  °Do not drive a car. If you are alone, do not take public transportation.  °Do not drink alcohol.  °Do not take medicine that has not been prescribed by your health care provider.  °Do not sign important papers or make important decisions.  °You may resume a normal diet and activities as directed by your health care provider.  °Change bandages (dressings) as directed.  °If you have questions or problems that seem related to general anesthesia, call the hospital and ask for the anesthetist or anesthesiologist on call. °SEEK MEDICAL CARE IF:  °You have nausea and vomiting that continue the day after anesthesia.  °You develop a rash. °SEEK IMMEDIATE MEDICAL CARE IF:  °You have difficulty breathing.  °You have chest pain.  °You have any allergic problems. °Document Released: 02/17/2001 Document Revised: 07/14/2013 Document Reviewed: 05/27/2013  °ExitCare® Patient Information ©2014 [employer].  ° ° °

## 2014-02-14 NOTE — Anesthesia Postprocedure Evaluation (Signed)
  Anesthesia Post-op Note  Patient: [name]  Procedure(s) Performed: Procedure(s): LAPAROSCOPIC CHOLECYSTECTOMY (N/A)  Patient Location: PACU  Anesthesia Type:General  Level of Consciousness: awake and alert   Airway and Oxygen Therapy: Patient Spontanous Breathing  Post-op Pain: mild  Post-op Assessment: Post-op Vital signs reviewed  Post-op Vital Signs: stable  Complications: No apparent anesthesia complications

## 2014-02-14 NOTE — Anesthesia Preprocedure Evaluation (Addendum)
Anesthesia Evaluation  Patient identified by MRN, date of birth, ID band Patient awake    Reviewed: Allergy & Precautions, H&P , NPO status , Patient's Chart, lab work & pertinent test results  Airway Mallampati: II  Neck ROM: Full    Dental  (+) Teeth Intact, Lower Dentures   Pulmonary Current Smoker,  breath sounds clear to auscultation        Cardiovascular hypertension, Pt. on medications + Peripheral Vascular Disease Rhythm:Regular Rate:Normal     Neuro/Psych    GI/Hepatic   Endo/Other  diabetes  Renal/GU      Musculoskeletal   Abdominal   Peds  Hematology   Anesthesia Other Findings   Reproductive/Obstetrics                          Anesthesia Physical Anesthesia Plan  ASA: III  Anesthesia Plan: General   Post-op Pain Management:    Induction: Intravenous  Airway Management Planned: Oral ETT  Additional Equipment:   Intra-op Plan:   Post-operative Plan: Extubation in OR  Informed Consent: I have reviewed the patients History and Physical, chart, labs and discussed the procedure including the risks, benefits and alternatives for the proposed anesthesia with the patient or authorized representative who has indicated his/her understanding and acceptance.   Dental advisory given  Plan Discussed with: CRNA and Surgeon  Anesthesia Plan Comments:         Anesthesia Quick Evaluation

## 2014-02-14 NOTE — Op Note (Signed)
02/14/2014  8:25 AM  PATIENT:  [name]  80 y.o. female  PRE-OPERATIVE DIAGNOSIS:  GALLSTONES  POST-OPERATIVE DIAGNOSIS:  GALLSTONES  PROCEDURE:  Procedure(s): LAPAROSCOPIC CHOLECYSTECTOMY (N/A)  SURGEON:  Surgeon(s) and Role:    * Ralene Ok, MD - Primary  PHYSICIAN ASSISTANT:   ASSISTANTS: none   ANESTHESIA:   local and general  EBL:  Total I/O In: 700 [I.V.:700] Out: -   BLOOD ADMINISTERED:none  DRAINS: none   LOCAL MEDICATIONS USED:  BUPIVICAINE   SPECIMEN:  Source of Specimen:  gallbladder  DISPOSITION OF SPECIMEN:  PATHOLOGY  COUNTS:  YES  TOURNIQUET:  * No tourniquets in log *  DICTATION: .Dragon Dictation  Details of the procedure: The patient was taken to the operating and placed in the supine position with bilateral SCDs in place. A time out was called and all facts were verified. A pneumoperitoneum was obtained via A Veress needle technique to a pressure of 38mm of mercury. A 39mm trochar was then placed in the right upper quadrant under visualization, and there were no injuries to any abdominal organs. A 11 mm port was then placed in the umbilical region after infiltrating with local anesthesia under direct visualization. A second epigastric port was placed under direct visualization. The gallbladder was identified and retracted, the peritoneum was then sharply dissected from the gallbladder and this dissection was carried down to Calot's triangle. The cystic duct was identified and stripped away circumferentially and seen going into the gallbladder 360, the critical angle was obtained. 2 clips were placed proximally one distally and the cystic duct transected. The cystic artery was identified and 2 clips placed proximally and one distally and transected. We then proceeded to remove the gallbladder off the hepatic fossa with Bovie cautery. A retrieval bag was then placed in the abdomen and gallbladder placed in the bag. The hepatic fossa was  then reexamined and hemostasis was achieved with Bovie cautery and was excellent at this portion of the case. The subhepatic fossa and perihepatic fossa was then irrigated until the effluent was clear. The 11 mm trocar fascia was reapproximated with the Endo Close #1 Vicryl x1. The pneumoperitoneum was evacuated and all trochars removed under direct visulalization. The skin was then closed with 4-0 Monocryl and the skin dressed with Steri-Strips, gauze, and tape. The patient was awaken from general anesthesia and taken to the recovery room in stable condition.    PLAN OF CARE: Discharge to home after PACU  PATIENT DISPOSITION:  PACU - hemodynamically stable.   Delay start of Pharmacological VTE agent (>24hrs) due to surgical blood loss or risk of bleeding: not applicable

## 2014-02-14 NOTE — H&P (View-Only) (Signed)
Patient ID: [name], female   DOB: 1934/07/13, 81 y.o.   MRN: [id number]  No chief complaint on file.   HPI [name] is a 80 y.o. female.  The patient is a 80 year old female who is referred by Dr. Burnice Logan for evaluation of a previous history of biliary pancreatitis as well as acute cholecystitis back in November 2014. Patient followed back up today with a recent episode of right upper quadrant pain after eating a cheeseburger and fries. Patient states the pain is very similar to what initially was back in November. At that time patient signed out AMA, but was diagnosed with biliary pancreatitis an ultrasound which revealed acute cholecystitis.   This patient and her case was discussed via an interpreter as the patient speaks [place].  HPI  Past Medical History  Diagnosis Date  . Diabetes mellitus, type 2   . Hyperlipidemia   . Hypertension   . History of cervical cancer   . TN (trigeminal neuralgia)   . Raynaud's phenomenon     Past Surgical History  Procedure Laterality Date  . Breast biopsy    . Breast enhancement surgery    . Total abdominal hysterectomy  1963    for cervical cancer in situ    No family history on file.  Social History History  Substance Use Topics  . Smoking status: Current Every Day Smoker -- 1.50 packs/day  . Smokeless tobacco: Never Used  . Alcohol Use: No    No Known Allergies  Current Outpatient Prescriptions  Medication Sig Dispense Refill  . aspirin EC 81 MG tablet Take 81 mg by mouth at bedtime.      [name] atorvastatin (LIPITOR) 20 MG tablet Take 1 tablet (20 mg total) by mouth every morning.  90 tablet  3  . lisinopril (PRINIVIL,ZESTRIL) 20 MG tablet Take 1 tablet (20 mg total) by mouth 2 (two) times daily.  90 tablet  6  . metFORMIN (GLUCOPHAGE) 500 MG tablet TAKE 1 TABLET BY MOUTH TWICE DAILY WITH A MEAL  180 tablet  1  . Multiple Vitamins-Minerals (WOMENS 50+ MULTI VITAMIN/MIN PO) Take 1 tablet by mouth every morning.        . pantoprazole (PROTONIX) 40 MG tablet Take 1 tablet (40 mg total) by mouth daily.  90 tablet  3   No current facility-administered medications for this visit.    Review of Systems Review of Systems  Constitutional: Negative.   HENT: Negative.   Respiratory: Negative.   Cardiovascular: Negative.   Gastrointestinal: Positive for abdominal pain.  Neurological: Negative.   All other systems reviewed and are negative.    Blood pressure 118/80, pulse 71, temperature 97.8 F (36.6 C), temperature source Temporal, resp. rate 16, height 5' (1.524 m), weight 106 lb (48.081 kg).  Physical Exam Physical Exam  Constitutional: She is oriented to person, place, and time. She appears well-developed and well-nourished.  HENT:  Head: Normocephalic and atraumatic.  Eyes: Conjunctivae and EOM are normal. Pupils are equal, round, and reactive to light.  Neck: Normal range of motion. Neck supple.  Cardiovascular: Normal rate, regular rhythm and normal heart sounds.   Pulmonary/Chest: Effort normal and breath sounds normal.  Abdominal: Soft. Bowel sounds are normal. She exhibits no distension and no mass. There is tenderness in the right upper quadrant. There is no rebound and no guarding.  Musculoskeletal: Normal range of motion.  Neurological: She is alert and oriented to person, place, and time.  Skin: Skin is warm and dry.  Psychiatric: She  has a normal mood and affect.    Data Reviewed Ultrasound revealed gallstones  Assessment    80 year old female with symptomatic cholelithiasis, chronic cholecystitis     Plan    1. We'll proceed to the operating room for a laparoscopic cholecystectomy 2.. All risks and benefits were discussed with the patient to generally include: infection, bleeding, possible need for post op ERCP, damage to the bile ducts, and bile leak. Alternatives were offered and described.  All questions were answered and the patient voiced understanding of the procedure  and wishes to proceed at this point with a laparoscopic cholecystectomy         Rosario Jacks., Embry Manrique 01/26/2014, 10:14 AM

## 2014-02-14 NOTE — Anesthesia Procedure Notes (Signed)
Procedure Name: Intubation Date/Time: 02/14/2014 7:33 AM Performed by: Kyung Rudd Pre-anesthesia Checklist: Patient identified, Emergency Drugs available, Suction available, Patient being monitored and Timeout performed Patient Re-evaluated:Patient Re-evaluated prior to inductionOxygen Delivery Method: Circle system utilized Preoxygenation: Pre-oxygenation with 100% oxygen Intubation Type: IV induction Ventilation: Mask ventilation without difficulty and Oral airway inserted - appropriate to patient size Laryngoscope Size: Mac and 3 Grade View: Grade I Tube type: Oral Tube size: 7.0 mm Number of attempts: 2 Airway Equipment and Method: Stylet Placement Confirmation: ETT inserted through vocal cords under direct vision,  positive ETCO2 and breath sounds checked- equal and bilateral Secured at: 21 cm Tube secured with: Tape Dental Injury: Teeth and Oropharynx as per pre-operative assessment  Comments: DL x 1 with MAC3 per Hebert Soho, paramedic student, unable to visualize vocal cords. 2nd DL per CRNA, grade 1 view. ETT passed per Vespi, +ETCO2 & BBS=.

## 2014-02-15 ENCOUNTER — Encounter (HOSPITAL_COMMUNITY): Payer: Self-pay | Admitting: General Surgery

## 2014-02-28 ENCOUNTER — Ambulatory Visit (INDEPENDENT_AMBULATORY_CARE_PROVIDER_SITE_OTHER): Payer: Medicare Other | Admitting: General Surgery

## 2014-02-28 ENCOUNTER — Encounter (INDEPENDENT_AMBULATORY_CARE_PROVIDER_SITE_OTHER): Payer: Self-pay | Admitting: General Surgery

## 2014-02-28 VITALS — BP 172/82 | HR 78 | Temp 97.4°F | Resp 16 | Ht 60.0 in | Wt 101.4 lb

## 2014-02-28 DIAGNOSIS — Z9889 Other specified postprocedural states: Secondary | ICD-10-CM

## 2014-02-28 DIAGNOSIS — Z9049 Acquired absence of other specified parts of digestive tract: Secondary | ICD-10-CM

## 2014-02-28 NOTE — Progress Notes (Signed)
Patient ID: [name], female   DOB: 1934/08/27, 80 y.o.   MRN: [id number] Post op course Patient is a 80 year old female status post laparoscopic cholecystectomy. Patient has been doing well postoperatively. She does state she has some pain in the right upper quadrant/epigastric region. Patient recently been prescribed pantoprazole but has not been taking that medication.  On Exam: Wounds are clean dry and intact  Pathology:   Chronic cholecystitis and cholelithiasis.  This was discussed with the patient.  Assessment and Plan 80 year old female status post laparoscopic cholecystectomy 1. Will have the patient follow back up in 1-2 week to assess her pain at that time. If it continues at that point we will have her ureter seen by her PCP or GI.    Ralene Ok, MD [facility] General & Minimally Invasive Surgery Trauma & Emergency Surgery

## 2014-03-08 ENCOUNTER — Encounter (INDEPENDENT_AMBULATORY_CARE_PROVIDER_SITE_OTHER): Payer: Self-pay | Admitting: General Surgery

## 2014-03-08 ENCOUNTER — Ambulatory Visit (INDEPENDENT_AMBULATORY_CARE_PROVIDER_SITE_OTHER): Payer: Medicare Other | Admitting: General Surgery

## 2014-03-08 VITALS — BP 136/76 | HR 77 | Temp 98.4°F | Resp 16 | Ht 60.0 in | Wt 102.0 lb

## 2014-03-08 DIAGNOSIS — Z9889 Other specified postprocedural states: Secondary | ICD-10-CM

## 2014-03-08 NOTE — Progress Notes (Signed)
Patient ID: [name], female   DOB: [birth year], 80 y.o.   MRN: [id number] Post op course The patient has been doing well postoperatively. She's had no pain. She said the pain similar to her preoperative assessment.  On Exam: Wounds are clean dry and intact  Pathology:   Chronic cholecystitis and cholelithiasis.  This was discussed with the patient.  Assessment and Plan 80 year old female status post laparoscopic cholecystectomy 1. We discussed  Low fat  diet for 2-3 months. 2. Patient followup as needed   Ralene Ok, MD [facility] General & Minimally Invasive Surgery Trauma & Emergency Surgery

## 2014-03-15 ENCOUNTER — Ambulatory Visit (INDEPENDENT_AMBULATORY_CARE_PROVIDER_SITE_OTHER): Payer: Medicare Other | Admitting: Internal Medicine

## 2014-03-15 ENCOUNTER — Encounter: Payer: Self-pay | Admitting: Internal Medicine

## 2014-03-15 VITALS — BP 160/86 | HR 62 | Temp 97.6°F | Resp 18 | Ht 60.0 in | Wt 104.0 lb

## 2014-03-15 DIAGNOSIS — Z720 Tobacco use: Secondary | ICD-10-CM

## 2014-03-15 DIAGNOSIS — I6529 Occlusion and stenosis of unspecified carotid artery: Secondary | ICD-10-CM

## 2014-03-15 DIAGNOSIS — F17200 Nicotine dependence, unspecified, uncomplicated: Secondary | ICD-10-CM | POA: Diagnosis not present

## 2014-03-15 DIAGNOSIS — E785 Hyperlipidemia, unspecified: Secondary | ICD-10-CM

## 2014-03-15 DIAGNOSIS — I10 Essential (primary) hypertension: Secondary | ICD-10-CM

## 2014-03-15 DIAGNOSIS — E119 Type 2 diabetes mellitus without complications: Secondary | ICD-10-CM | POA: Diagnosis not present

## 2014-03-15 LAB — HEMOGLOBIN A1C: HEMOGLOBIN A1C: 6.4 % (ref 4.6–6.5)

## 2014-03-15 MED ORDER — HYDROCHLOROTHIAZIDE 12.5 MG PO TABS: 12.5000 mg | ORAL_TABLET | Freq: Every day | ORAL | Status: DC

## 2014-03-15 NOTE — Progress Notes (Signed)
Subjective:    Patient ID: [name], female    DOB: 1933-12-20, 80 y.o.   MRN: [id number]  HPI  BP Readings from Last 3 Encounters:  03/15/14 160/86  03/08/14 136/76  02/28/14 55/65    80 year old patient who has type 2 diabetes.  She is seen today for followup.  She states that her blood pressure frequently is elevated, especially systolic readings.  She was seen by general surgery one week ago with a normal blood pressure.  She has been on combination therapy in the past, but presently is on lisinopril only. She has type 2 diabetes, which has been controlled on metformin therapy. Her only complaint today is left arm pain. She has done well following laparoscopic cholecystectomy.  Hospital records reviewed.  Past Medical History  Diagnosis Date  . Diabetes mellitus, type 2   . Hyperlipidemia   . Hypertension   . History of cervical cancer   . TN (trigeminal neuralgia)   . Raynaud's phenomenon     History   Social History  . Marital Status: Married    Spouse Name: [name]    Number of Children: 1  . Years of Education: N/A   Occupational History  . Unemployed    Social History Main Topics  . Smoking status: Current Every Day Smoker -- 1.50 packs/day for 60 years    Types: Cigarettes  . Smokeless tobacco: Never Used  . Alcohol Use: No  . Drug Use: No  . Sexual Activity: Not on file   Other Topics Concern  . Not on file   Social History Narrative   Married.  Lives in [place] with her husband.  No FH of heart disease.    Past Surgical History  Procedure Laterality Date  . Breast biopsy    . Breast enhancement surgery    . Total abdominal hysterectomy  1963    for cervical cancer in situ  . Cholecystectomy N/A 02/14/2014    Procedure: LAPAROSCOPIC CHOLECYSTECTOMY;  Surgeon: Ralene Ok, MD;  Location: [facility];  Service: General;  Laterality: N/A;    History reviewed. No pertinent family history.  No Known Allergies  Current Outpatient  Prescriptions on File Prior to Visit  Medication Sig Dispense Refill  . aspirin EC 81 MG tablet Take 81 mg by mouth at bedtime.      [name] atorvastatin (LIPITOR) 20 MG tablet Take 20 mg by mouth every morning.      [name] lisinopril (PRINIVIL,ZESTRIL) 20 MG tablet Take 20 mg by mouth 2 (two) times daily.      . metFORMIN (GLUCOPHAGE) 500 MG tablet Take 500 mg by mouth 2 (two) times daily with a meal.      . Multiple Vitamins-Minerals (WOMENS 50+ MULTI VITAMIN/MIN PO) Take 1 tablet by mouth every morning.       . ONE TOUCH ULTRA TEST test strip USE 1 STRIP DAILY AS INSTRUCTED  100 each  12  . oxyCODONE-acetaminophen (ROXICET) 5-325 MG per tablet Take 1-2 tablets by mouth every 4 (four) hours as needed for severe pain.  30 tablet  0   No current facility-administered medications on file prior to visit.    BP 160/86  Pulse 62  Temp(Src) 97.6 F (36.4 C) (Oral)  Resp 18  Ht 5' (1.524 m)  Wt 104 lb (47.174 kg)  BMI 20.31 kg/m2     Review of Systems  Constitutional: Negative.   HENT: Negative for congestion, dental problem, hearing loss, rhinorrhea, sinus pressure, sore throat and tinnitus.  Eyes: Negative for pain, discharge and visual disturbance.  Respiratory: Negative for cough and shortness of breath.   Cardiovascular: Negative for chest pain, palpitations and leg swelling.  Gastrointestinal: Negative for nausea, vomiting, abdominal pain, diarrhea, constipation, blood in stool and abdominal distention.  Genitourinary: Negative for dysuria, urgency, frequency, hematuria, flank pain, vaginal bleeding, vaginal discharge, difficulty urinating, vaginal pain and pelvic pain.  Musculoskeletal: Negative for arthralgias, gait problem and joint swelling.       Left upper arm pain  Skin: Negative for rash.  Neurological: Negative for dizziness, syncope, speech difficulty, weakness, numbness and headaches.  Hematological: Negative for adenopathy.  Psychiatric/Behavioral: Negative for behavioral  problems, dysphoric mood and agitation. The patient is not nervous/anxious.        Objective:   Physical Exam  Constitutional: She is oriented to person, place, and time. She appears well-developed and well-nourished.  Repeat blood pressure 160/80  HENT:  Head: Normocephalic.  Right Ear: External ear normal.  Left Ear: External ear normal.  Mouth/Throat: Oropharynx is clear and moist.  Eyes: Conjunctivae and EOM are normal. Pupils are equal, round, and reactive to light.  Neck: Normal range of motion. Neck supple. No thyromegaly present.  Cardiovascular: Normal rate, regular rhythm and normal heart sounds.   Right posterior tibial pulse full  Pulmonary/Chest: Effort normal and breath sounds normal.  Abdominal: Soft. Bowel sounds are normal. She exhibits no mass. There is no tenderness.  Musculoskeletal: Normal range of motion.  Lymphadenopathy:    She has no cervical adenopathy.  Neurological: She is alert and oriented to person, place, and time.  Skin: Skin is warm and dry. No rash noted.  Psychiatric: She has a normal mood and affect. Her behavior is normal.          Assessment & Plan:   Hypertension.  Patient has had consistently elevated home blood pressure readings, especially systolic.  We'll resume hydrochlorothiazide 12 point 5 mg daily.  Home blood pressure monitoring.  Recommended.  Low-salt diet.  Recommended.  We'll reassess in 4 weeks Diabetes mellitus.  We'll check a hemoglobin A1c Left arm pain.  Unclear etiology.  Range of motion left shoulder appears full Status post laparoscopic cholecystectomy  Reassess 4 weeks

## 2014-03-15 NOTE — Patient Instructions (Signed)
Limit your sodium (Salt) intake  Please check your blood pressure on a regular basis.  If it is consistently greater than 150/90, please make an office appointment.   

## 2014-03-15 NOTE — Progress Notes (Signed)
Pre-visit discussion using our clinic review tool. No additional management support is needed unless otherwise documented below in the visit note.  

## 2014-03-16 ENCOUNTER — Telehealth: Payer: Self-pay | Admitting: Internal Medicine

## 2014-03-16 MED ORDER — HYDROCHLOROTHIAZIDE 12.5 MG PO TABS: 12.5000 mg | ORAL_TABLET | Freq: Every day | ORAL | Status: DC

## 2014-03-16 NOTE — Telephone Encounter (Signed)
Pt is needing new rx hydrochlorothiazide (HYDRODIURIL) 12.5 MG tablet, pt states she was seen yesterday and dr. Raliegh Ip informed her he would call it in. [facility] never received the rx. Pt waiting on meds.

## 2014-03-16 NOTE — Telephone Encounter (Signed)
Rx resent to [facility] and pt notified.

## 2014-03-25 ENCOUNTER — Telehealth: Payer: Self-pay

## 2014-03-25 NOTE — Telephone Encounter (Signed)
Relevant patient education mailed to patient.  

## 2014-04-12 ENCOUNTER — Ambulatory Visit (INDEPENDENT_AMBULATORY_CARE_PROVIDER_SITE_OTHER): Payer: Medicare Other | Admitting: Internal Medicine

## 2014-04-12 ENCOUNTER — Encounter: Payer: Self-pay | Admitting: Internal Medicine

## 2014-04-12 VITALS — BP 128/70 | HR 88 | Temp 98.5°F | Resp 18 | Ht 60.0 in | Wt 102.0 lb

## 2014-04-12 DIAGNOSIS — W57XXXA Bitten or stung by nonvenomous insect and other nonvenomous arthropods, initial encounter: Secondary | ICD-10-CM

## 2014-04-12 DIAGNOSIS — I10 Essential (primary) hypertension: Secondary | ICD-10-CM | POA: Diagnosis not present

## 2014-04-12 DIAGNOSIS — I6529 Occlusion and stenosis of unspecified carotid artery: Secondary | ICD-10-CM

## 2014-04-12 DIAGNOSIS — T148 Other injury of unspecified body region: Secondary | ICD-10-CM | POA: Diagnosis not present

## 2014-04-12 DIAGNOSIS — E119 Type 2 diabetes mellitus without complications: Secondary | ICD-10-CM | POA: Diagnosis not present

## 2014-04-12 DIAGNOSIS — T63421A Toxic effect of venom of ants, accidental (unintentional), initial encounter: Secondary | ICD-10-CM

## 2014-04-12 MED ORDER — TRIAMCINOLONE ACETONIDE 0.1 % EX CREA: 1.0000 "application " | TOPICAL_CREAM | Freq: Two times a day (BID) | CUTANEOUS | Status: DC

## 2014-04-12 NOTE — Patient Instructions (Signed)
Call or return to clinic prn if these symptoms worsen or fail to improve as anticipated. [employer]  A fire ant bite appears as a red lump in the skin. It sometimes has a tiny hole in the center. Reactions to these bites can be severe. A severe reaction is called an anaphylactic reaction. With a severe reaction there may be symptoms of wheezing or difficulty breathing, chest pain, fainting and raised red patches on the skin (hives) that itch. There may also be nausea (feeling sick to your stomach), vomiting, cramping or diarrhea. Usually after 24 hours a small sterile pustule (a tiny sac in the skin filled with pus but no germs) develops. There may be itching, burning, and redness. HOME CARE INSTRUCTIONS   Apply a cold compress for 10 to 20 minutes every hour for 1 to 2 days. This will reduce swelling and itching.  After 24 to 48 hours, a warm compress may be soothing and will help decrease swelling.  To relieve itching and swelling, you may use:  Diphenhydramine, available over-the-counter. Take medicine as directed. Do not drink alcohol or drive while taking this medicine.  Hydrocortisone cream may be applied lightly 4 times per day for a couple days or as directed.  Calamine lotion with diphenhydramine may be used lightly on the bite 4 times per day for itching or as directed. Do not take with oral diphenhydramine.  Only take over-the-counter or prescription medicines for pain, discomfort, or fever as directed by your caregiver. SEEK MEDICAL CARE IF:   None of the above helps within 2 to 3 days.  The area becomes red, warm, tender, and swollen beyond the area of the bite or sting. SEEK IMMEDIATE MEDICAL CARE IF:   You have a fever.  You have symptoms of an allergic reaction (wheezing or difficulty breathing).  You develop chest pain, fainting, or raised red patches on the skin that itch.  You develop nausea, vomiting, cramping, or diarrhea. These may be early signs of a serious  generalized or anaphylactic reaction. MAKE SURE YOU:   Understand these instructions.  Will watch your condition.  Will get help right away if you are not doing well or get worse. Document Released: 08/06/2001 Document Revised: 02/03/2012 Document Reviewed: 11/02/2008 [facility] Patient Information 2014 [place].

## 2014-04-12 NOTE — Progress Notes (Signed)
Pre-visit discussion using our clinic review tool. No additional management support is needed unless otherwise documented below in the visit note.  

## 2014-04-12 NOTE — Progress Notes (Signed)
   Subjective:    Patient ID: [name], female    DOB: Mar 04, 1934, 80 y.o.   MRN: [id number]  HPI   80 year old patient who has a history of stable hypertension, and diabetes, who is seen today for evaluation after multiple antibiotics, involving her feet while gardening recently.  The dermatitis is quite pruritic and not helped by topical hydrocortisone cream.  Past Medical History  Diagnosis Date  . Diabetes mellitus, type 2   . Hyperlipidemia   . Hypertension   . History of cervical cancer   . TN (trigeminal neuralgia)   . Raynaud's phenomenon     History   Social History  . Marital Status: Married    Spouse Name: [name]    Number of Children: 1  . Years of Education: N/A   Occupational History  . Unemployed    Social History Main Topics  . Smoking status: Current Every Day Smoker -- 1.50 packs/day for 60 years    Types: Cigarettes  . Smokeless tobacco: Never Used  . Alcohol Use: No  . Drug Use: No  . Sexual Activity: Not on file   Other Topics Concern  . Not on file   Social History Narrative   Married.  Lives in [place] with her husband.  No FH of heart disease.    Past Surgical History  Procedure Laterality Date  . Breast biopsy    . Breast enhancement surgery    . Total abdominal hysterectomy  1963    for cervical cancer in situ  . Cholecystectomy N/A 02/14/2014    Procedure: LAPAROSCOPIC CHOLECYSTECTOMY;  Surgeon: Ralene Ok, MD;  Location: [facility];  Service: General;  Laterality: N/A;    History reviewed. No pertinent family history.  No Known Allergies  Current Outpatient Prescriptions on File Prior to Visit  Medication Sig Dispense Refill  . aspirin EC 81 MG tablet Take 81 mg by mouth at bedtime.      [name] atorvastatin (LIPITOR) 20 MG tablet Take 20 mg by mouth every morning.      . hydrochlorothiazide (HYDRODIURIL) 12.5 MG tablet Take 1 tablet (12.5 mg total) by mouth daily.  90 tablet  3  . lisinopril (PRINIVIL,ZESTRIL) 20 MG  tablet Take 20 mg by mouth 2 (two) times daily.      . metFORMIN (GLUCOPHAGE) 500 MG tablet Take 500 mg by mouth 2 (two) times daily with a meal.      . Multiple Vitamins-Minerals (WOMENS 50+ MULTI VITAMIN/MIN PO) Take 1 tablet by mouth every morning.       . ONE TOUCH ULTRA TEST test strip USE 1 STRIP DAILY AS INSTRUCTED  100 each  12   No current facility-administered medications on file prior to visit.    BP 128/70  Pulse 88  Temp(Src) 98.5 F (36.9 C) (Oral)  Resp 18  Ht 5' (1.524 m)  Wt 102 lb (46.267 kg)  BMI 19.92 kg/m2  SpO2 98%       Review of Systems  Skin: Positive for rash.       Objective:   Physical Exam  Constitutional: She appears well-developed and well-nourished. No distress.  Skin:  Scattered erythematous, papular lesions over the feet and ankles          Assessment & Plan:   Multiple ant bites.  We'll treat with topical triamcinolone Hypertension stable Diabetes mellitus stable  Return office visit as scheduled

## 2014-04-20 DIAGNOSIS — H2510 Age-related nuclear cataract, unspecified eye: Secondary | ICD-10-CM | POA: Diagnosis not present

## 2014-04-20 DIAGNOSIS — H11159 Pinguecula, unspecified eye: Secondary | ICD-10-CM | POA: Diagnosis not present

## 2014-04-20 DIAGNOSIS — H11059 Peripheral pterygium, progressive, unspecified eye: Secondary | ICD-10-CM | POA: Diagnosis not present

## 2014-04-20 DIAGNOSIS — H43819 Vitreous degeneration, unspecified eye: Secondary | ICD-10-CM | POA: Diagnosis not present

## 2014-06-21 ENCOUNTER — Other Ambulatory Visit (HOSPITAL_COMMUNITY): Payer: Self-pay | Admitting: *Deleted

## 2014-06-21 DIAGNOSIS — I6529 Occlusion and stenosis of unspecified carotid artery: Secondary | ICD-10-CM

## 2014-06-22 ENCOUNTER — Encounter (HOSPITAL_COMMUNITY): Payer: Medicare Other

## 2014-06-22 ENCOUNTER — Ambulatory Visit (HOSPITAL_COMMUNITY): Payer: Medicare Other | Attending: Cardiology | Admitting: *Deleted

## 2014-06-22 DIAGNOSIS — E119 Type 2 diabetes mellitus without complications: Secondary | ICD-10-CM | POA: Diagnosis not present

## 2014-06-22 DIAGNOSIS — I10 Essential (primary) hypertension: Secondary | ICD-10-CM | POA: Diagnosis not present

## 2014-06-22 DIAGNOSIS — E785 Hyperlipidemia, unspecified: Secondary | ICD-10-CM | POA: Diagnosis not present

## 2014-06-22 DIAGNOSIS — F17200 Nicotine dependence, unspecified, uncomplicated: Secondary | ICD-10-CM | POA: Insufficient documentation

## 2014-06-22 DIAGNOSIS — I6529 Occlusion and stenosis of unspecified carotid artery: Secondary | ICD-10-CM | POA: Diagnosis not present

## 2014-06-22 NOTE — Progress Notes (Signed)
Carotid duplex complete 

## 2014-06-27 ENCOUNTER — Encounter: Payer: Self-pay | Admitting: Internal Medicine

## 2014-06-27 ENCOUNTER — Telehealth: Payer: Self-pay | Admitting: Internal Medicine

## 2014-06-27 ENCOUNTER — Ambulatory Visit (INDEPENDENT_AMBULATORY_CARE_PROVIDER_SITE_OTHER): Payer: Medicare Other | Admitting: Internal Medicine

## 2014-06-27 VITALS — BP 138/70 | HR 84 | Temp 98.1°F | Resp 18 | Ht 60.0 in | Wt 101.0 lb

## 2014-06-27 DIAGNOSIS — E785 Hyperlipidemia, unspecified: Secondary | ICD-10-CM

## 2014-06-27 DIAGNOSIS — I6529 Occlusion and stenosis of unspecified carotid artery: Secondary | ICD-10-CM | POA: Diagnosis not present

## 2014-06-27 DIAGNOSIS — E119 Type 2 diabetes mellitus without complications: Secondary | ICD-10-CM

## 2014-06-27 DIAGNOSIS — I10 Essential (primary) hypertension: Secondary | ICD-10-CM | POA: Diagnosis not present

## 2014-06-27 NOTE — Telephone Encounter (Signed)
Relevant patient education mailed to patient.  

## 2014-06-27 NOTE — Progress Notes (Signed)
Subjective:    Patient ID: [name], female    DOB: [birth year], 80 y.o.   MRN: [id number]  HPI 80 year old patient who is seen for her poorly followup.  She has diabetes, hypertension, dyslipidemia, and ongoing tobacco use.  She has had an eye examination within the past 2 months.  No new concerns or complaints. Hemoglobin A1c has been well controlled. Denies any cardiopulmonary complaints. She has a history of Raynaud's which has been stable.  Past Medical History  Diagnosis Date  . Diabetes mellitus, type 2   . Hyperlipidemia   . Hypertension   . History of cervical cancer   . TN (trigeminal neuralgia)   . Raynaud's phenomenon     History   Social History  . Marital Status: Married    Spouse Name: [name]    Number of Children: 1  . Years of Education: N/A   Occupational History  . Unemployed    Social History Main Topics  . Smoking status: Current Every Day Smoker -- 1.50 packs/day for 60 years    Types: Cigarettes  . Smokeless tobacco: Never Used  . Alcohol Use: No  . Drug Use: No  . Sexual Activity: Not on file   Other Topics Concern  . Not on file   Social History Narrative   Married.  Lives in [place] with her husband.  No FH of heart disease.    Past Surgical History  Procedure Laterality Date  . Breast biopsy    . Breast enhancement surgery    . Total abdominal hysterectomy  1963    for cervical cancer in situ  . Cholecystectomy N/A 02/14/2014    Procedure: LAPAROSCOPIC CHOLECYSTECTOMY;  Surgeon: Ralene Ok, MD;  Location: [facility];  Service: General;  Laterality: N/A;    History reviewed. No pertinent family history.  No Known Allergies  Current Outpatient Prescriptions on File Prior to Visit  Medication Sig Dispense Refill  . aspirin EC 81 MG tablet Take 81 mg by mouth at bedtime.      [name] atorvastatin (LIPITOR) 20 MG tablet Take 20 mg by mouth every morning.      . hydrochlorothiazide (HYDRODIURIL) 12.5 MG tablet Take 1  tablet (12.5 mg total) by mouth daily.  90 tablet  3  . lisinopril (PRINIVIL,ZESTRIL) 20 MG tablet Take 20 mg by mouth 2 (two) times daily.      . metFORMIN (GLUCOPHAGE) 500 MG tablet Take 500 mg by mouth 2 (two) times daily with a meal.      . Multiple Vitamins-Minerals (WOMENS 50+ MULTI VITAMIN/MIN PO) Take 1 tablet by mouth every morning.       . ONE TOUCH ULTRA TEST test strip USE 1 STRIP DAILY AS INSTRUCTED  100 each  12  . triamcinolone cream (KENALOG) 0.1 % Apply 1 application topically 2 (two) times daily.  60 g  2   No current facility-administered medications on file prior to visit.    BP 138/70  Pulse 84  Temp(Src) 98.1 F (36.7 C) (Oral)  Resp 18  Ht 5' (1.524 m)  Wt 101 lb (45.813 kg)  BMI 19.73 kg/m2  SpO2 98%      Review of Systems  Constitutional: Negative.   HENT: Negative for congestion, dental problem, hearing loss, rhinorrhea, sinus pressure, sore throat and tinnitus.   Eyes: Negative for pain, discharge and visual disturbance.  Respiratory: Negative for cough and shortness of breath.   Cardiovascular: Negative for chest pain, palpitations and leg swelling.  Gastrointestinal: Negative for  nausea, vomiting, abdominal pain, diarrhea, constipation, blood in stool and abdominal distention.  Genitourinary: Negative for dysuria, urgency, frequency, hematuria, flank pain, vaginal bleeding, vaginal discharge, difficulty urinating, vaginal pain and pelvic pain.  Musculoskeletal: Negative for arthralgias, gait problem and joint swelling.  Skin: Negative for rash.  Neurological: Negative for dizziness, syncope, speech difficulty, weakness, numbness and headaches.  Hematological: Negative for adenopathy.  Psychiatric/Behavioral: Negative for behavioral problems, dysphoric mood and agitation. The patient is not nervous/anxious.        Objective:   Physical Exam  Constitutional: She is oriented to person, place, and time. She appears well-developed and well-nourished.    HENT:  Head: Normocephalic.  Right Ear: External ear normal.  Left Ear: External ear normal.  Mouth/Throat: Oropharynx is clear and moist.  Eyes: Conjunctivae and EOM are normal. Pupils are equal, round, and reactive to light.  Neck: Normal range of motion. Neck supple. No thyromegaly present.  Cardiovascular: Normal rate, regular rhythm, normal heart sounds and intact distal pulses.   Right dorsalis pedis pulse diminished  Pulmonary/Chest: Effort normal and breath sounds normal.  Abdominal: Soft. Bowel sounds are normal. She exhibits no mass. There is no tenderness.  Musculoskeletal: Normal range of motion.  Lymphadenopathy:    She has no cervical adenopathy.  Neurological: She is alert and oriented to person, place, and time.  Skin: Skin is warm and dry. No rash noted.  Psychiatric: She has a normal mood and affect. Her behavior is normal.          Assessment & Plan:   Diabetes mellitus.  Well controlled.  We'll check a hemoglobin A1c and urine for microalbumin Dyslipidemia.  We'll check a lipid profile Hypertension stable Ongoing tobacco use smoking cessation encouraged  Recheck 3 months

## 2014-06-27 NOTE — Progress Notes (Signed)
Pre visit review using our clinic review tool, if applicable. No additional management support is needed unless otherwise documented below in the visit note. 

## 2014-06-27 NOTE — Patient Instructions (Signed)
Smoking tobacco is very bad for your health. You should stop smoking immediately.   Please check your hemoglobin A1c every 3 months  Return in 3 months for follow-up

## 2014-08-02 ENCOUNTER — Emergency Department (HOSPITAL_COMMUNITY): Payer: Medicare Other

## 2014-08-02 ENCOUNTER — Encounter (HOSPITAL_COMMUNITY): Payer: Self-pay | Admitting: Emergency Medicine

## 2014-08-02 ENCOUNTER — Emergency Department (HOSPITAL_COMMUNITY)
Admission: EM | Admit: 2014-08-02 | Discharge: 2014-08-02 | Disposition: A | Discharge status: Home / Self Care | Payer: Medicare Other | Attending: Emergency Medicine | Admitting: Emergency Medicine

## 2014-08-02 DIAGNOSIS — S0990XA Unspecified injury of head, initial encounter: Secondary | ICD-10-CM | POA: Diagnosis not present

## 2014-08-02 DIAGNOSIS — E119 Type 2 diabetes mellitus without complications: Secondary | ICD-10-CM | POA: Insufficient documentation

## 2014-08-02 DIAGNOSIS — F17200 Nicotine dependence, unspecified, uncomplicated: Secondary | ICD-10-CM | POA: Diagnosis not present

## 2014-08-02 DIAGNOSIS — S3981XA Other specified injuries of abdomen, initial encounter: Secondary | ICD-10-CM | POA: Insufficient documentation

## 2014-08-02 DIAGNOSIS — E785 Hyperlipidemia, unspecified: Secondary | ICD-10-CM | POA: Insufficient documentation

## 2014-08-02 DIAGNOSIS — Z8541 Personal history of malignant neoplasm of cervix uteri: Secondary | ICD-10-CM | POA: Insufficient documentation

## 2014-08-02 DIAGNOSIS — S2249XA Multiple fractures of ribs, unspecified side, initial encounter for closed fracture: Secondary | ICD-10-CM | POA: Insufficient documentation

## 2014-08-02 DIAGNOSIS — S0993XA Unspecified injury of face, initial encounter: Secondary | ICD-10-CM | POA: Diagnosis not present

## 2014-08-02 DIAGNOSIS — R42 Dizziness and giddiness: Secondary | ICD-10-CM | POA: Insufficient documentation

## 2014-08-02 DIAGNOSIS — Z79899 Other long term (current) drug therapy: Secondary | ICD-10-CM | POA: Diagnosis not present

## 2014-08-02 DIAGNOSIS — W19XXXA Unspecified fall, initial encounter: Secondary | ICD-10-CM

## 2014-08-02 DIAGNOSIS — R296 Repeated falls: Secondary | ICD-10-CM | POA: Insufficient documentation

## 2014-08-02 DIAGNOSIS — Z8669 Personal history of other diseases of the nervous system and sense organs: Secondary | ICD-10-CM | POA: Insufficient documentation

## 2014-08-02 DIAGNOSIS — S199XXA Unspecified injury of neck, initial encounter: Secondary | ICD-10-CM | POA: Diagnosis not present

## 2014-08-02 DIAGNOSIS — Y9289 Other specified places as the place of occurrence of the external cause: Secondary | ICD-10-CM | POA: Diagnosis not present

## 2014-08-02 DIAGNOSIS — Y9389 Activity, other specified: Secondary | ICD-10-CM | POA: Insufficient documentation

## 2014-08-02 DIAGNOSIS — I10 Essential (primary) hypertension: Secondary | ICD-10-CM | POA: Insufficient documentation

## 2014-08-02 DIAGNOSIS — S298XXA Other specified injuries of thorax, initial encounter: Secondary | ICD-10-CM | POA: Diagnosis not present

## 2014-08-02 DIAGNOSIS — Z7982 Long term (current) use of aspirin: Secondary | ICD-10-CM | POA: Insufficient documentation

## 2014-08-02 DIAGNOSIS — S0003XA Contusion of scalp, initial encounter: Secondary | ICD-10-CM | POA: Diagnosis not present

## 2014-08-02 DIAGNOSIS — S1093XA Contusion of unspecified part of neck, initial encounter: Secondary | ICD-10-CM | POA: Diagnosis not present

## 2014-08-02 DIAGNOSIS — S2232XA Fracture of one rib, left side, initial encounter for closed fracture: Secondary | ICD-10-CM

## 2014-08-02 LAB — CBC WITH DIFFERENTIAL/PLATELET
BASOS ABS: 0 10*3/uL (ref 0.0–0.1)
BASOS PCT: 0 % (ref 0–1)
Eosinophils Absolute: 0 10*3/uL (ref 0.0–0.7)
Eosinophils Relative: 0 % (ref 0–5)
HCT: 44.2 % (ref 36.0–46.0)
Hemoglobin: 15.4 g/dL — ABNORMAL HIGH (ref 12.0–15.0)
Lymphocytes Relative: 23 % (ref 12–46)
Lymphs Abs: 2.4 10*3/uL (ref 0.7–4.0)
MCH: 32.2 pg (ref 26.0–34.0)
MCHC: 34.8 g/dL (ref 30.0–36.0)
MCV: 92.3 fL (ref 78.0–100.0)
Monocytes Absolute: 0.6 10*3/uL (ref 0.1–1.0)
Monocytes Relative: 6 % (ref 3–12)
NEUTROS ABS: 7.6 10*3/uL (ref 1.7–7.7)
NEUTROS PCT: 71 % (ref 43–77)
Platelets: 265 10*3/uL (ref 150–400)
RBC: 4.79 MIL/uL (ref 3.87–5.11)
RDW: 13.4 % (ref 11.5–15.5)
WBC: 10.7 10*3/uL — AB (ref 4.0–10.5)

## 2014-08-02 LAB — COMPREHENSIVE METABOLIC PANEL
ALK PHOS: 81 U/L (ref 39–117)
ALT: 19 U/L (ref 0–35)
AST: 27 U/L (ref 0–37)
Albumin: 3.7 g/dL (ref 3.5–5.2)
Anion gap: 15 (ref 5–15)
BILIRUBIN TOTAL: 0.4 mg/dL (ref 0.3–1.2)
BUN: 20 mg/dL (ref 6–23)
CHLORIDE: 95 meq/L — AB (ref 96–112)
CO2: 23 mEq/L (ref 19–32)
Calcium: 9.3 mg/dL (ref 8.4–10.5)
Creatinine, Ser: 0.81 mg/dL (ref 0.50–1.10)
GFR calc Af Amer: 79 mL/min — ABNORMAL LOW (ref >90)
GFR calc non Af Amer: 68 mL/min — ABNORMAL LOW (ref >90)
Glucose, Bld: 157 mg/dL — ABNORMAL HIGH (ref 70–99)
POTASSIUM: 4.2 meq/L (ref 3.7–5.3)
Sodium: 133 mEq/L — ABNORMAL LOW (ref 137–147)
Total Protein: 6.9 g/dL (ref 6.0–8.3)

## 2014-08-02 LAB — URINALYSIS, ROUTINE W REFLEX MICROSCOPIC
BILIRUBIN URINE: NEGATIVE
Glucose, UA: NEGATIVE mg/dL
HGB URINE DIPSTICK: NEGATIVE
KETONES UR: NEGATIVE mg/dL
LEUKOCYTES UA: NEGATIVE
Nitrite: NEGATIVE
PH: 6 (ref 5.0–8.0)
Protein, ur: NEGATIVE mg/dL
Specific Gravity, Urine: 1.019 (ref 1.005–1.030)
Urobilinogen, UA: 0.2 mg/dL (ref 0.0–1.0)

## 2014-08-02 MED ORDER — TRAMADOL HCL 50 MG PO TABS: 50.0000 mg | ORAL_TABLET | Freq: Four times a day (QID) | ORAL | Status: DC | PRN

## 2014-08-02 MED ORDER — MORPHINE SULFATE 2 MG/ML IJ SOLN: 2.0000 mg | Freq: Once | INTRAMUSCULAR | Status: DC

## 2014-08-02 MED ORDER — FENTANYL CITRATE 0.05 MG/ML IJ SOLN
50.0000 ug | Freq: Once | INTRAMUSCULAR | Status: DC
  Filled 2014-08-02: qty 2

## 2014-08-02 MED ORDER — ONDANSETRON HCL 4 MG/2ML IJ SOLN
4.0000 mg | Freq: Once | INTRAMUSCULAR | Status: AC
  Administered 2014-08-02: 4 mg via INTRAVENOUS
  Filled 2014-08-02: qty 2

## 2014-08-02 MED ORDER — MORPHINE SULFATE 2 MG/ML IJ SOLN
2.0000 mg | Freq: Once | INTRAMUSCULAR | Status: AC
  Administered 2014-08-02: 2 mg via INTRAVENOUS
  Filled 2014-08-02: qty 1

## 2014-08-02 MED ORDER — DIPHENHYDRAMINE HCL 50 MG/ML IJ SOLN
12.5000 mg | Freq: Once | INTRAMUSCULAR | Status: AC
  Administered 2014-08-02: 12.5 mg via INTRAVENOUS
  Filled 2014-08-02: qty 1

## 2014-08-02 NOTE — ED Provider Notes (Signed)
CSN: [id number]     Arrival date & time 08/02/14  3704 History   First MD Initiated Contact with Patient 08/02/14 1020     Chief Complaint  Patient presents with  . Fall  . Head Injury  . Flank Pain     (Consider location/radiation/quality/duration/timing/severity/associated sxs/prior Treatment) HPI  [name] is a 80 y.o. female who was standing up to feed birds, yesterday, when she suddenly again to feel dizzy and fell. She injured the left side of her head and her left ribs, in the fall. She is able to get up on her own and get back into her house. Since then, she has had persistent pain in the left chest which is worse with movement or deep wreathing. She denies shortness of breath, at rest. She denies loss of consciousness after the incident; blurred vision, dizziness, or paresthesia at this time. There are no other known modifying factors.   Past Medical History  Diagnosis Date  . Diabetes mellitus, type 2   . Hyperlipidemia   . Hypertension   . History of cervical cancer   . TN (trigeminal neuralgia)   . Raynaud's phenomenon    Past Surgical History  Procedure Laterality Date  . Breast biopsy    . Breast enhancement surgery    . Total abdominal hysterectomy  1963    for cervical cancer in situ  . Cholecystectomy N/A 02/14/2014    Procedure: LAPAROSCOPIC CHOLECYSTECTOMY;  Surgeon: Ralene Ok, MD;  Location: [facility];  Service: General;  Laterality: N/A;   No family history on file. History  Substance Use Topics  . Smoking status: Current Every Day Smoker -- 1.50 packs/day for 60 years    Types: Cigarettes  . Smokeless tobacco: Never Used  . Alcohol Use: No   OB History   Grav Para Term Preterm Abortions TAB SAB Ect Mult Living                 Review of Systems  All other systems reviewed and are negative.     Allergies  Morphine and related  Home Medications   Prior to Admission medications   Medication Sig Start Date End Date Taking?  Authorizing Provider  acetaminophen (TYLENOL) 500 MG tablet Take 1,000 mg by mouth every 6 (six) hours as needed for moderate pain.   Yes Historical Provider, MD  aspirin EC 81 MG tablet Take 81 mg by mouth at bedtime.   Yes Historical Provider, MD  atorvastatin (LIPITOR) 20 MG tablet Take 20 mg by mouth every morning. 12/15/13  Yes Marletta Lor, MD  hydrochlorothiazide (HYDRODIURIL) 12.5 MG tablet Take 1 tablet (12.5 mg total) by mouth daily. 03/16/14  Yes Marletta Lor, MD  lisinopril (PRINIVIL,ZESTRIL) 20 MG tablet Take 20 mg by mouth 2 (two) times daily. 12/15/13  Yes Marletta Lor, MD  metFORMIN (GLUCOPHAGE) 500 MG tablet Take 500 mg by mouth 2 (two) times daily with a meal. 12/15/13  Yes Marletta Lor, MD  Multiple Vitamins-Minerals (WOMENS 50+ MULTI VITAMIN/MIN PO) Take 1 tablet by mouth every morning.    Yes Historical Provider, MD  ONE TOUCH ULTRA TEST test strip USE 1 STRIP DAILY AS INSTRUCTED 01/26/14  Yes Marletta Lor, MD  tetrahydrozoline-zinc (VISINE-AC) 0.05-0.25 % ophthalmic solution Place 1 drop into both eyes daily as needed.   Yes Historical Provider, MD  traMADol (ULTRAM) 50 MG tablet Take 1 tablet (50 mg total) by mouth every 6 (six) hours as needed. 08/02/14   Melissaann Dizdarevic L  Eulis Foster, MD   BP 173/70  Pulse 67  Temp(Src) 98 F (36.7 C) (Oral)  Resp 21  Ht 5' (1.524 m)  Wt 98 lb (44.453 kg)  BMI 19.14 kg/m2  SpO2 93% Physical Exam  Nursing note and vitals reviewed. Constitutional: She is oriented to person, place, and time. She appears well-developed and well-nourished.  She is able to communicate well in [place], but defers answers to questions to a family member, who is with her.  HENT:  Head: Normocephalic.  Small contusion, left forehead; no associated crepitation. No facial or head laceration  Eyes: Conjunctivae and EOM are normal. Pupils are equal, round, and reactive to light.  Neck: Normal range of motion and phonation normal. Neck supple.   Cardiovascular: Normal rate, regular rhythm and intact distal pulses.   Pulmonary/Chest: Effort normal and breath sounds normal. She exhibits tenderness (left chest wall, without crepitation or deformity; moderate.).  Abdominal: Soft. She exhibits no distension. There is no tenderness. There is no guarding.  Musculoskeletal: Normal range of motion.  Neurological: She is alert and oriented to person, place, and time. She exhibits normal muscle tone.  Skin: Skin is warm and dry.  Psychiatric: She has a normal mood and affect. Her behavior is normal. Judgment and thought content normal.    ED Course  Procedures (including critical care time)   Medications  fentaNYL (SUBLIMAZE) injection 50 mcg (0 mcg Intravenous Hold 08/02/14 1411)  morphine 2 MG/ML injection 2 mg (2 mg Intravenous Given 08/02/14 1227)  ondansetron (ZOFRAN) injection 4 mg (4 mg Intravenous Given 08/02/14 1226)  diphenhydrAMINE (BENADRYL) injection 12.5 mg (12.5 mg Intravenous Given 08/02/14 1235)    Patient Vitals for the past 24 hrs:  BP Temp Temp src Pulse Resp SpO2 Height Weight  08/02/14 1430 173/70 mmHg - - 67 21 93 % - -  08/02/14 1350 191/60 mmHg - - 56 20 97 % - -  08/02/14 1330 181/63 mmHg - - 59 19 95 % - -  08/02/14 1300 161/67 mmHg - - 60 18 96 % - -  08/02/14 1230 163/81 mmHg - - 65 18 97 % - -  08/02/14 1200 174/56 mmHg - - 61 19 97 % - -  08/02/14 1142 173/50 mmHg - - 60 16 99 % - -  08/02/14 1022 179/62 mmHg - - 72 20 99 % - -  08/02/14 0945 140/60 mmHg 98 F (36.7 C) Oral 72 18 97 % 5' (1.524 m) 98 lb (44.453 kg)    1:50 PM Reevaluation with update and discussion. After initial assessment and treatment, an updated evaluation reveals she has developed more left anterior chest wall pain. A repeat EKG was ordered to evaluate for cardiac ischemia. Findings discussed with pt. And family, all questions answred. [place] Review Labs Reviewed  CBC WITH DIFFERENTIAL - Abnormal; Notable for the  following:    WBC 10.7 (*)    Hemoglobin 15.4 (*)    All other components within normal limits  COMPREHENSIVE METABOLIC PANEL - Abnormal; Notable for the following:    Sodium 133 (*)    Chloride 95 (*)    Glucose, Bld 157 (*)    GFR calc non Af Amer 68 (*)    GFR calc Af Amer 79 (*)    All other components within normal limits  URINALYSIS, ROUTINE W REFLEX MICROSCOPIC    Imaging Review Dg Ribs Unilateral W/chest Left  08/02/2014   CLINICAL DATA:  Fall onto the LEFT  side.  LEFT rib pain.  EXAM: LEFT RIBS AND CHEST - 3+ VIEW  COMPARISON:  Multiple priors, most recently dating 02/04/2014.  FINDINGS: There is a small LEFT pleural effusion which is new compared to prior studies. No left-sided pneumothorax. Faint dystrophic calcifications are present overlying the LEFT thoracic inlet which appear chronic.  The LEFT eighth and ninth ribs show buckling compatible with nondisplaced fractures. These are on the lateral margins of the ribs, superior to the area of marker.  There is opacity in the RIGHT mid lung which appears more prominent than on prior exam. Given the findings in the LEFT chest, this may represent atelectasis and scarring however a followup Chest radiograph in 8 weeks is recommended to reassess.  The cardiopericardial silhouette is within normal limits. Aortic arch atherosclerosis.  IMPRESSION: 1. Nondisplaced LEFT eighth and ninth lateral rib fractures. 2. No pneumothorax. Small likely reactive LEFT pleural effusion with LEFT basilar atelectasis. 3. New ill-defined opacity in the RIGHT mid lung compared to prior chest radiographs. This may represent atelectasis and/or scarring however a followup radiograph in 8 weeks is recommended to reassess.   Electronically Signed   By: Dereck Ligas M.D.   On: 08/02/2014 11:00   Ct Head Wo Contrast  08/02/2014   CLINICAL DATA:  Dizziness and recent fall  EXAM: CT HEAD WITHOUT CONTRAST  CT CERVICAL SPINE WITHOUT CONTRAST  TECHNIQUE: Multidetector CT  imaging of the head and cervical spine was performed following the standard protocol without intravenous contrast. Multiplanar CT image reconstructions of the cervical spine were also generated.  COMPARISON:  None.  FINDINGS: CT HEAD FINDINGS  The bony calvarium is intact. There is a radiopaque density identified just anterior to the frontal bone at the level of the frontal sinus. This may represent a focal implant and by report is been present on prior exams. Mild atrophic changes are seen. No findings to suggest acute hemorrhage, acute infarction or space-occupying mass lesion are noted.  CT CERVICAL SPINE FINDINGS  Seven cervical segments are well visualized. Osteophytic changes are noted worst at the C6-7 level with endplate sclerosis. Mild facet hypertrophic changes are seen. No acute fracture or acute facet abnormality is seen.  IMPRESSION: CT of the head: No acute intracranial abnormality is noted. Chronic atrophic changes are noted.  CT of the cervical spine: Degenerative changes without acute abnormality.   Electronically Signed   By: Inez Catalina M.D.   On: 08/02/2014 11:43   Ct Cervical Spine Wo Contrast  08/02/2014   CLINICAL DATA:  Dizziness and recent fall  EXAM: CT HEAD WITHOUT CONTRAST  CT CERVICAL SPINE WITHOUT CONTRAST  TECHNIQUE: Multidetector CT imaging of the head and cervical spine was performed following the standard protocol without intravenous contrast. Multiplanar CT image reconstructions of the cervical spine were also generated.  COMPARISON:  None.  FINDINGS: CT HEAD FINDINGS  The bony calvarium is intact. There is a radiopaque density identified just anterior to the frontal bone at the level of the frontal sinus. This may represent a focal implant and by report is been present on prior exams. Mild atrophic changes are seen. No findings to suggest acute hemorrhage, acute infarction or space-occupying mass lesion are noted.  CT CERVICAL SPINE FINDINGS  Seven cervical segments are well  visualized. Osteophytic changes are noted worst at the C6-7 level with endplate sclerosis. Mild facet hypertrophic changes are seen. No acute fracture or acute facet abnormality is seen.  IMPRESSION: CT of the head: No acute intracranial abnormality is noted.  Chronic atrophic changes are noted.  CT of the cervical spine: Degenerative changes without acute abnormality.   Electronically Signed   By: Inez Catalina M.D.   On: 08/02/2014 11:43     EKG Interpretation   Date/Time:  Tuesday August 02 2014 13:49:49 EDT Ventricular Rate:  60 PR Interval:  137 QRS Duration: 90 QT Interval:  444 QTC Calculation: 444 R Axis:   -29 Text Interpretation:  Sinus rhythm Borderline left axis deviation Abnormal  R-wave progression, early transition Baseline wander in lead(s) V2 Since  last tracing of earlier today No significant change was found Confirmed by  Eulis Foster  MD, Jhoselyn Ruffini [id number]) on 08/02/2014 2:03:50 PM       EKG Interpretation  Date/Time:  Tuesday August 02 2014 13:49:49 EDT Ventricular Rate:  60 PR Interval:  137 QRS Duration: 90 QT Interval:  444 QTC Calculation: 444 R Axis:   -29 Text Interpretation:  Sinus rhythm Borderline left axis deviation Abnormal R-wave progression, early transition Baseline wander in lead(s) V2 Since last tracing of earlier today No significant change was found Confirmed by Eulis Foster  MD, Vira Agar ([id number]) on 08/02/2014 2:03:50 PM        MDM   Final diagnoses:  Fall, initial encounter  Head injury, initial encounter  Rib fracture, left, closed, initial encounter    Full, subacute, with rib fractures as the isolated significant. Doubt serious head injury, neck, injury, serious bacterial infection or metabolic instability.   Nursing Notes Reviewed/ Care Coordinated Applicable Imaging Reviewed Interpretation of Laboratory Data incorporated into ED treatment  The patient appears reasonably screened and/or stabilized for discharge and I doubt any other medical  condition or other [facility] requiring further screening, evaluation, or treatment in the ED at this time prior to discharge.  Plan: Home Medications- Tramadol; Home Treatments- rest; return here if the recommended treatment, does not improve the symptoms; Recommended follow up- PCP prn    Richarda Blade, MD 08/04/14 [phone]

## 2014-08-02 NOTE — ED Notes (Addendum)
Felt dizzy and fell bumped her head yesterday states did have loc unsure how long  and  Hurt her riibs left side from falling  Hurts to take  DEEP BREATH pain feels numbness and,weak since yesterday. Has had coffee this am

## 2014-08-02 NOTE — Discharge Instructions (Signed)
Fall Prevention and Home Safety Falls cause injuries and can affect all age groups. It is possible to use preventive measures to significantly decrease the likelihood of falls. There are many simple measures which can make your home safer and prevent falls. OUTDOORS  Repair cracks and edges of walkways and driveways.  Remove high doorway thresholds.  Trim shrubbery on the main path into your home.  Have good outside lighting.  Clear walkways of tools, rocks, debris, and clutter.  Check that handrails are not broken and are securely fastened. Both sides of steps should have handrails.  Have leaves, snow, and ice cleared regularly.  Use sand or salt on walkways during winter months.  In the garage, clean up grease or oil spills. BATHROOM  Install night lights.  Install grab bars by the toilet and in the tub and shower.  Use non-skid mats or decals in the tub or shower.  Place a plastic non-slip stool in the shower to sit on, if needed.  Keep floors dry and clean up all water on the floor immediately.  Remove soap buildup in the tub or shower on a regular basis.  Secure bath mats with non-slip, double-sided rug tape.  Remove throw rugs and tripping hazards from the floors. BEDROOMS  Install night lights.  Make sure a bedside light is easy to reach.  Do not use oversized bedding.  Keep a telephone by your bedside.  Have a firm chair with side arms to use for getting dressed.  Remove throw rugs and tripping hazards from the floor. KITCHEN  Keep handles on pots and pans turned toward the center of the stove. Use back burners when possible.  Clean up spills quickly and allow time for drying.  Avoid walking on wet floors.  Avoid hot utensils and knives.  Position shelves so they are not too high or low.  Place commonly used objects within easy reach.  If necessary, use a sturdy step stool with a grab bar when reaching.  Keep electrical cables out of the  way.  Do not use floor polish or wax that makes floors slippery. If you must use wax, use non-skid floor wax.  Remove throw rugs and tripping hazards from the floor. STAIRWAYS  Never leave objects on stairs.  Place handrails on both sides of stairways and use them. Fix any loose handrails. Make sure handrails on both sides of the stairways are as long as the stairs.  Check carpeting to make sure it is firmly attached along stairs. Make repairs to worn or loose carpet promptly.  Avoid placing throw rugs at the top or bottom of stairways, or properly secure the rug with carpet tape to prevent slippage. Get rid of throw rugs, if possible.  Have an electrician put in a light switch at the top and bottom of the stairs. OTHER FALL PREVENTION TIPS  Wear low-heel or rubber-soled shoes that are supportive and fit well. Wear closed toe shoes.  When using a stepladder, make sure it is fully opened and both spreaders are firmly locked. Do not climb a closed stepladder.  Add color or contrast paint or tape to grab bars and handrails in your home. Place contrasting color strips on first and last steps.  Learn and use mobility aids as needed. Install an electrical emergency response system.  Turn on lights to avoid dark areas. Replace light bulbs that burn out immediately. Get light switches that glow.  Arrange furniture to create clear pathways. Keep furniture in the same place.  Firmly attach carpet with non-skid or double-sided tape. °· Eliminate uneven floor surfaces. °· Select a carpet pattern that does not visually hide the edge of steps. °· Be aware of all pets. °OTHER HOME SAFETY TIPS °· Set the water temperature for 120° F (48.8° C). °· Keep emergency numbers on or near the telephone. °· Keep smoke detectors on every level of the home and near sleeping areas. °Document Released: 11/01/2002 Document Revised: 05/12/2012 Document Reviewed: 01/31/2012 °[employer]® Patient Information ©2015  [employer]. This information is not intended to replace advice given to you by your health care provider. Make sure you discuss any questions you have with your health care provider. ° ° °Head Injury °You have received a head injury. It does not appear serious at this time. Headaches and vomiting are common following head injury. It should be easy to awaken from sleeping. Sometimes it is necessary for you to stay in the emergency department for a while for observation. Sometimes admission to the hospital may be needed. After injuries such as yours, most problems occur within the first 24 hours, but side effects may occur up to 7-10 days after the injury. It is important for you to carefully monitor your condition and contact your health care provider or seek immediate medical care if there is a change in your condition. °WHAT ARE THE TYPES OF HEAD INJURIES? °Head injuries can be as minor as a bump. Some head injuries can be more severe. More severe head injuries include: °· A jarring injury to the brain (concussion). °· A bruise of the brain (contusion). This mean there is bleeding in the brain that can cause swelling. °· A cracked skull (skull fracture). °· Bleeding in the brain that collects, clots, and forms a bump (hematoma). °WHAT CAUSES A HEAD INJURY? °A serious head injury is most likely to happen to someone who is in a car wreck and is not wearing a seat belt. Other causes of major head injuries include bicycle or motorcycle accidents, sports injuries, and falls. °HOW ARE HEAD INJURIES DIAGNOSED? °A complete history of the event leading to the injury and your current symptoms will be helpful in diagnosing head injuries. Many times, pictures of the brain, such as CT or MRI are needed to see the extent of the injury. Often, an overnight hospital stay is necessary for observation.  °WHEN SHOULD I SEEK IMMEDIATE MEDICAL CARE?  °You should get help right away if: °· You have confusion or drowsiness. °· You  feel sick to your stomach (nauseous) or have continued, forceful vomiting. °· You have dizziness or unsteadiness that is getting worse. °· You have severe, continued headaches not relieved by medicine. Only take over-the-counter or prescription medicines for pain, fever, or discomfort as directed by your health care provider. °· You do not have normal function of the arms or legs or are unable to walk. °· You notice changes in the black spots in the center of the colored part of your eye (pupil). °· You have a clear or bloody fluid coming from your nose or ears. °· You have a loss of vision. °During the next 24 hours after the injury, you must stay with someone who can watch you for the warning signs. This person should contact local emergency services (911 in the [place] if you have seizures, you become unconscious, or you are unable to wake up. °HOW CAN I PREVENT A HEAD INJURY IN THE FUTURE? °The most important factor for preventing major head injuries is avoiding motor   accidents. To minimize the potential for damage to your head, it is crucial to wear seat belts while riding in motor vehicles. Wearing helmets while bike riding and playing collision sports (like football) is also helpful. Also, avoiding dangerous activities around the house will further help reduce your risk of head injury.  WHEN CAN I RETURN TO NORMAL ACTIVITIES AND ATHLETICS? You should be reevaluated by your health care provider before returning to these activities. If you have any of the following symptoms, you should not return to activities or contact sports until 1 week after the symptoms have stopped:  Persistent headache.  Dizziness or vertigo.  Poor attention and concentration.  Confusion.  Memory problems.  Nausea or vomiting.  Fatigue or tire easily.  Irritability.  Intolerant of bright lights or loud noises.  Anxiety or depression.  Disturbed sleep. MAKE SURE YOU:   Understand these instructions.  Will  watch your condition.  Will get help right away if you are not doing well or get worse. Document Released: 11/11/2005 Document Revised: 11/16/2013 Document Reviewed: 07/19/2013 [facility] Patient Information 2015 [place], [place]. This information is not intended to replace advice given to you by your health care provider. Make sure you discuss any questions you have with your health care provider.  Rib Fracture A rib fracture is a break or crack in one of the bones of the ribs. The ribs are a group of long, curved bones that wrap around your chest and attach to your spine. They protect your lungs and other organs in the chest cavity. A broken or cracked rib is often painful, but most do not cause other problems. Most rib fractures heal on their own over time. However, rib fractures can be more serious if multiple ribs are broken or if broken ribs move out of place and push against other structures. CAUSES   A direct blow to the chest. For example, this could happen during contact sports, a car accident, or a fall against a hard object.  Repetitive movements with high force, such as pitching a baseball or having severe coughing spells. SYMPTOMS   Pain when you breathe in or cough.  Pain when someone presses on the injured area. DIAGNOSIS  Your caregiver will perform a physical exam. Various imaging tests may be ordered to confirm the diagnosis and to look for related injuries. These tests may include a chest X-ray, computed tomography (CT), magnetic resonance imaging (MRI), or a bone scan. TREATMENT  Rib fractures usually heal on their own in 1-3 months. The longer healing period is often associated with a continued cough or other aggravating activities. During the healing period, pain control is very important. Medication is usually given to control pain. Hospitalization or surgery may be needed for more severe injuries, such as those in which multiple ribs are broken or the ribs have moved out of  place.  HOME CARE INSTRUCTIONS   Avoid strenuous activity and any activities or movements that cause pain. Be careful during activities and avoid bumping the injured rib.  Gradually increase activity as directed by your caregiver.  Only take over-the-counter or prescription medications as directed by your caregiver. Do not take other medications without asking your caregiver first.  Apply ice to the injured area for the first 1-2 days after you have been treated or as directed by your caregiver. Applying ice helps to reduce inflammation and pain.  Put ice in a plastic bag.  Place a towel between your skin and the bag.   Leave  the ice on for 15-20 minutes at a time, every 2 hours while you are awake.  Perform deep breathing as directed by your caregiver. This will help prevent pneumonia, which is a common complication of a broken rib. Your caregiver may instruct you to:  Take deep breaths several times a day.  Try to cough several times a day, holding a pillow against the injured area.  Use a device called an incentive spirometer to practice deep breathing several times a day.  Drink enough fluids to keep your urine clear or pale yellow. This will help you avoid constipation.   Do not wear a rib belt or binder. These restrict breathing, which can lead to pneumonia.  SEEK IMMEDIATE MEDICAL CARE IF:   You have a fever.   You have difficulty breathing or shortness of breath.   You develop a continual cough, or you cough up thick or bloody sputum.  You feel sick to your stomach (nausea), throw up (vomit), or have abdominal pain.   You have worsening pain not controlled with medications.  MAKE SURE YOU:  Understand these instructions.  Will watch your condition.  Will get help right away if you are not doing well or get worse. Document Released: 11/11/2005 Document Revised: 07/14/2013 Document Reviewed: 01/13/2013 [facility] Patient Information 2015 [place], [place]. This  information is not intended to replace advice given to you by your health care provider. Make sure you discuss any questions you have with your health care provider.

## 2014-08-02 NOTE — ED Notes (Signed)
Patient transported to X-ray 

## 2014-08-02 NOTE — ED Notes (Signed)
EKG completed and given to EDP.  

## 2014-09-19 DIAGNOSIS — Z23 Encounter for immunization: Secondary | ICD-10-CM | POA: Diagnosis not present

## 2014-09-20 ENCOUNTER — Encounter: Payer: Self-pay | Admitting: Internal Medicine

## 2014-09-20 ENCOUNTER — Ambulatory Visit (INDEPENDENT_AMBULATORY_CARE_PROVIDER_SITE_OTHER): Payer: Medicare Other | Admitting: Internal Medicine

## 2014-09-20 VITALS — BP 128/80 | HR 88 | Temp 98.3°F | Resp 18 | Ht 60.0 in | Wt 99.0 lb

## 2014-09-20 DIAGNOSIS — I10 Essential (primary) hypertension: Secondary | ICD-10-CM

## 2014-09-20 DIAGNOSIS — E119 Type 2 diabetes mellitus without complications: Secondary | ICD-10-CM | POA: Diagnosis not present

## 2014-09-20 DIAGNOSIS — I6529 Occlusion and stenosis of unspecified carotid artery: Secondary | ICD-10-CM | POA: Diagnosis not present

## 2014-09-20 DIAGNOSIS — Z720 Tobacco use: Secondary | ICD-10-CM | POA: Diagnosis not present

## 2014-09-20 NOTE — Progress Notes (Signed)
Subjective:    Patient ID: [name], female    DOB: 18-Sep-1934, 80 y.o.   MRN: [id number]  HPI  80 year old patient who is seen following a recent ED visit about 6 weeks ago.  She sustained a left chest wall trauma.  When she looked up and hyperextended her head and became vertiginous and fell.  She states that this happens occasionally with extreme hyperextension of her neck.  Does not sound orthostatic. ED records were revealed.  The patient sustained 2 rib fractures on the left.  Head and neck CT were unremarkable. Her chest wall pain has largely resolved She has a history of hypertension, ongoing tobacco use and Raynaud's phenomenon  Past Medical History  Diagnosis Date  . Diabetes mellitus, type 2   . Hyperlipidemia   . Hypertension   . History of cervical cancer   . TN (trigeminal neuralgia)   . Raynaud's phenomenon     History   Social History  . Marital Status: Married    Spouse Name: [name]    Number of Children: 1  . Years of Education: N/A   Occupational History  . Unemployed    Social History Main Topics  . Smoking status: Current Every Day Smoker -- 1.50 packs/day for 60 years    Types: Cigarettes  . Smokeless tobacco: Never Used  . Alcohol Use: No  . Drug Use: No  . Sexual Activity: Not on file   Other Topics Concern  . Not on file   Social History Narrative   Married.  Lives in [place] with her husband.  No FH of heart disease.    Past Surgical History  Procedure Laterality Date  . Breast biopsy    . Breast enhancement surgery    . Total abdominal hysterectomy  1963    for cervical cancer in situ  . Cholecystectomy N/A 02/14/2014    Procedure: LAPAROSCOPIC CHOLECYSTECTOMY;  Surgeon: Ralene Ok, MD;  Location: [facility];  Service: General;  Laterality: N/A;    History reviewed. No pertinent family history.  Allergies  Allergen Reactions  . Morphine And Related Itching    Short lived, mild itching.    Current Outpatient  Prescriptions on File Prior to Visit  Medication Sig Dispense Refill  . acetaminophen (TYLENOL) 500 MG tablet Take 1,000 mg by mouth every 6 (six) hours as needed for moderate pain.      [name] aspirin EC 81 MG tablet Take 81 mg by mouth at bedtime.      [name] atorvastatin (LIPITOR) 20 MG tablet Take 20 mg by mouth every morning.      . hydrochlorothiazide (HYDRODIURIL) 12.5 MG tablet Take 1 tablet (12.5 mg total) by mouth daily.  90 tablet  3  . lisinopril (PRINIVIL,ZESTRIL) 20 MG tablet Take 20 mg by mouth 2 (two) times daily.      . metFORMIN (GLUCOPHAGE) 500 MG tablet Take 500 mg by mouth 2 (two) times daily with a meal.      . Multiple Vitamins-Minerals (WOMENS 50+ MULTI VITAMIN/MIN PO) Take 1 tablet by mouth every morning.       . ONE TOUCH ULTRA TEST test strip USE 1 STRIP DAILY AS INSTRUCTED  100 each  12  . tetrahydrozoline-zinc (VISINE-AC) 0.05-0.25 % ophthalmic solution Place 1 drop into both eyes daily as needed.      . traMADol (ULTRAM) 50 MG tablet Take 1 tablet (50 mg total) by mouth every 6 (six) hours as needed.  30 tablet  0   No  current facility-administered medications on file prior to visit.    BP 128/80  Pulse 88  Temp(Src) 98.3 F (36.8 C) (Oral)  Resp 18  Ht 5' (1.524 m)  Wt 99 lb (44.906 kg)  BMI 19.33 kg/m2  SpO2 96%     Review of Systems  Constitutional: Negative.   HENT: Negative for congestion, dental problem, hearing loss, rhinorrhea, sinus pressure, sore throat and tinnitus.   Eyes: Negative for pain, discharge and visual disturbance.  Respiratory: Negative for cough and shortness of breath.   Cardiovascular: Negative for chest pain, palpitations and leg swelling.  Gastrointestinal: Negative for nausea, vomiting, abdominal pain, diarrhea, constipation, blood in stool and abdominal distention.  Genitourinary: Negative for dysuria, urgency, frequency, hematuria, flank pain, vaginal bleeding, vaginal discharge, difficulty urinating, vaginal pain and pelvic pain.   Musculoskeletal: Negative for arthralgias, gait problem and joint swelling.  Skin: Negative for rash.  Neurological: Negative for dizziness, syncope, speech difficulty, weakness, numbness and headaches.  Hematological: Negative for adenopathy.  Psychiatric/Behavioral: Negative for behavioral problems, dysphoric mood and agitation. The patient is not nervous/anxious.        Objective:   Physical Exam  Constitutional: She is oriented to person, place, and time. She appears well-developed and well-nourished.  Blood pressure 120/82, without significant orthostatic change  HENT:  Head: Normocephalic.  Right Ear: External ear normal.  Left Ear: External ear normal.  Mouth/Throat: Oropharynx is clear and moist.  Eyes: Conjunctivae and EOM are normal. Pupils are equal, round, and reactive to light.  Neck: Normal range of motion. Neck supple. No thyromegaly present.  Cardiovascular: Normal rate, regular rhythm, normal heart sounds and intact distal pulses.   Pulmonary/Chest: Effort normal and breath sounds normal.  Abdominal: Soft. Bowel sounds are normal. She exhibits no mass. There is no tenderness.  Musculoskeletal: Normal range of motion.  Lymphadenopathy:    She has no cervical adenopathy.  Neurological: She is alert and oriented to person, place, and time.  Skin: Skin is warm and dry. No rash noted.  Psychiatric: She has a normal mood and affect. Her behavior is normal.          Assessment & Plan:   Hypertension, well-controlled History of recent fall Diabetes mellitus, stable Ongoing tobacco use.  Total smoking cessation encouraged  Recheck 6 months with updated lab

## 2014-09-20 NOTE — Progress Notes (Signed)
Pre visit review using our clinic review tool, if applicable. No additional management support is needed unless otherwise documented below in the visit note. 

## 2014-09-20 NOTE — Patient Instructions (Signed)
Return in 6 months for follow-up  Limit your sodium (Salt) intake   Please check your hemoglobin A1c every 3 months

## 2014-10-24 ENCOUNTER — Other Ambulatory Visit: Payer: Self-pay | Admitting: Internal Medicine

## 2014-10-25 IMAGING — CR DG RIBS W/ CHEST 3+V*L*
3 series · 3 of 3 positions shown · non-contrast
Comparison: Multiple priors, most recently dating 02/04/2014.

CLINICAL DATA: Fall onto the LEFT side.  LEFT rib pain.

EXAM:
LEFT RIBS AND CHEST - 3+ VIEW

[w chest pa]
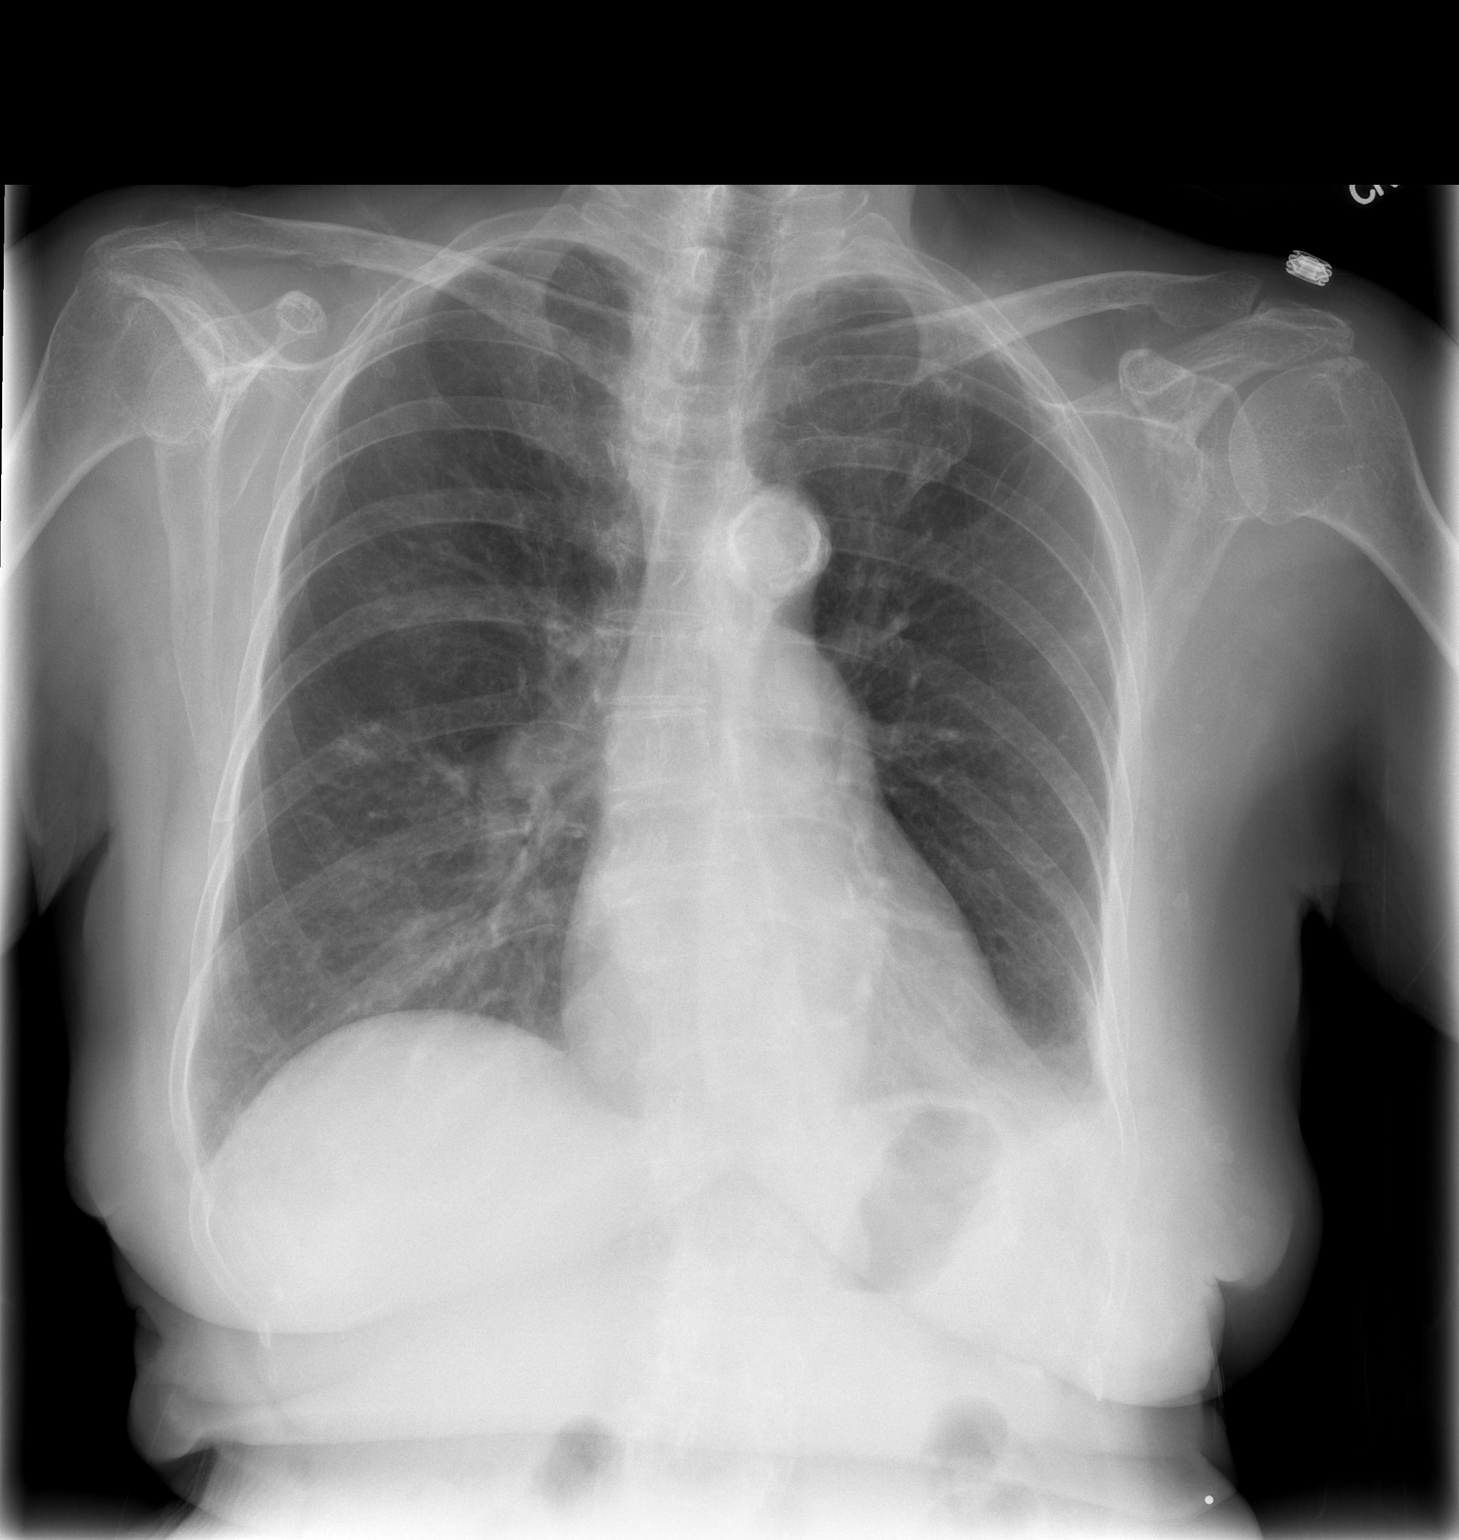

[w ribs ap/pa upper left]
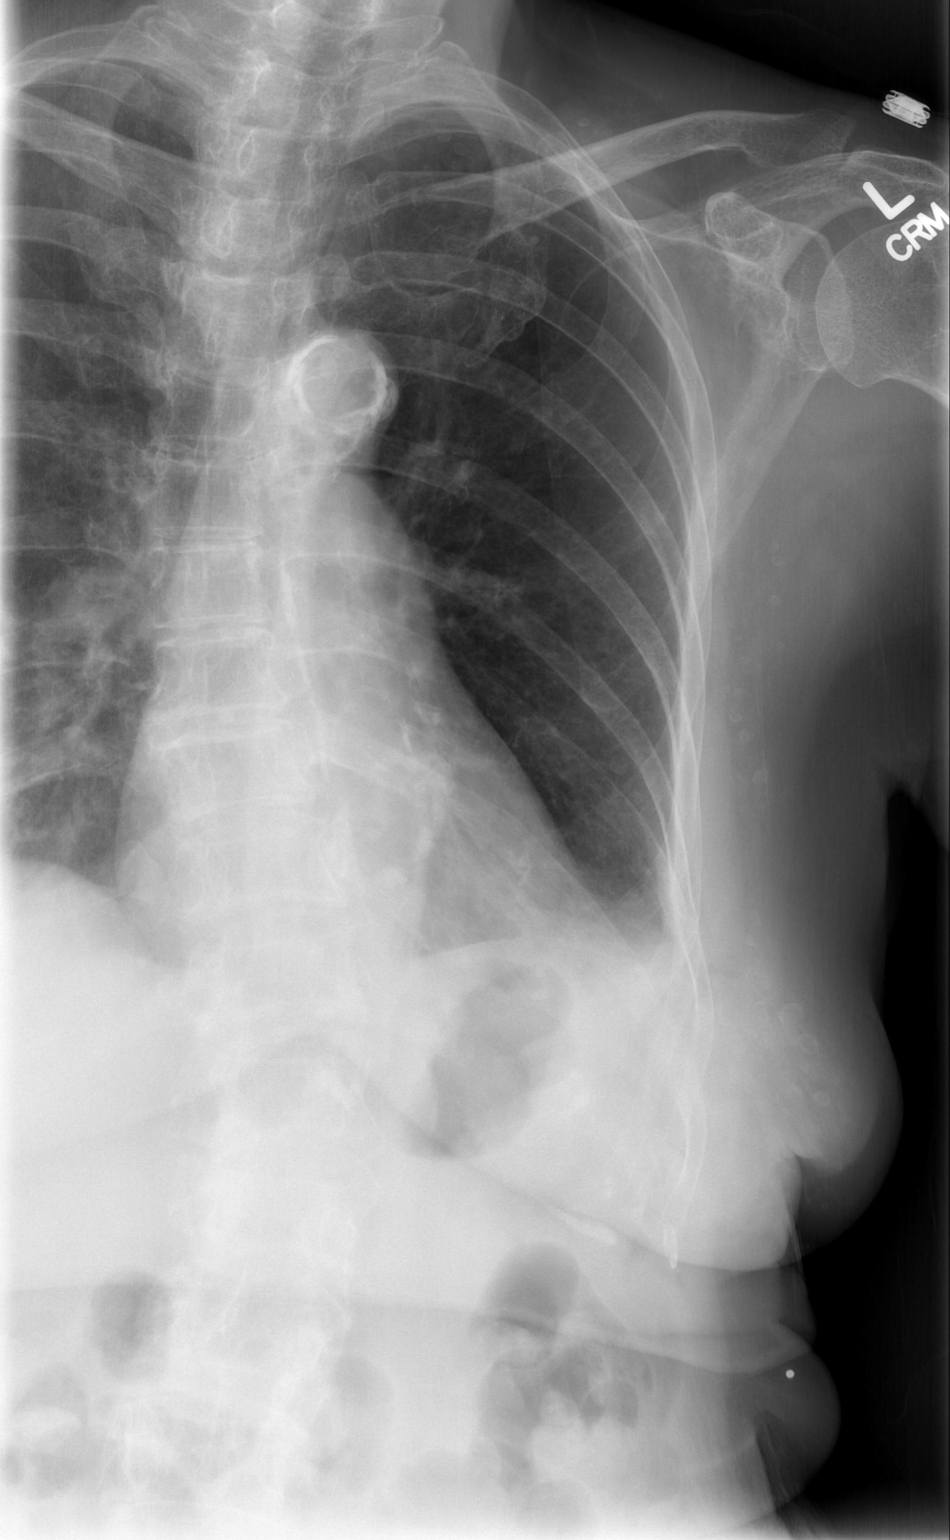

[w ribs oblique left]
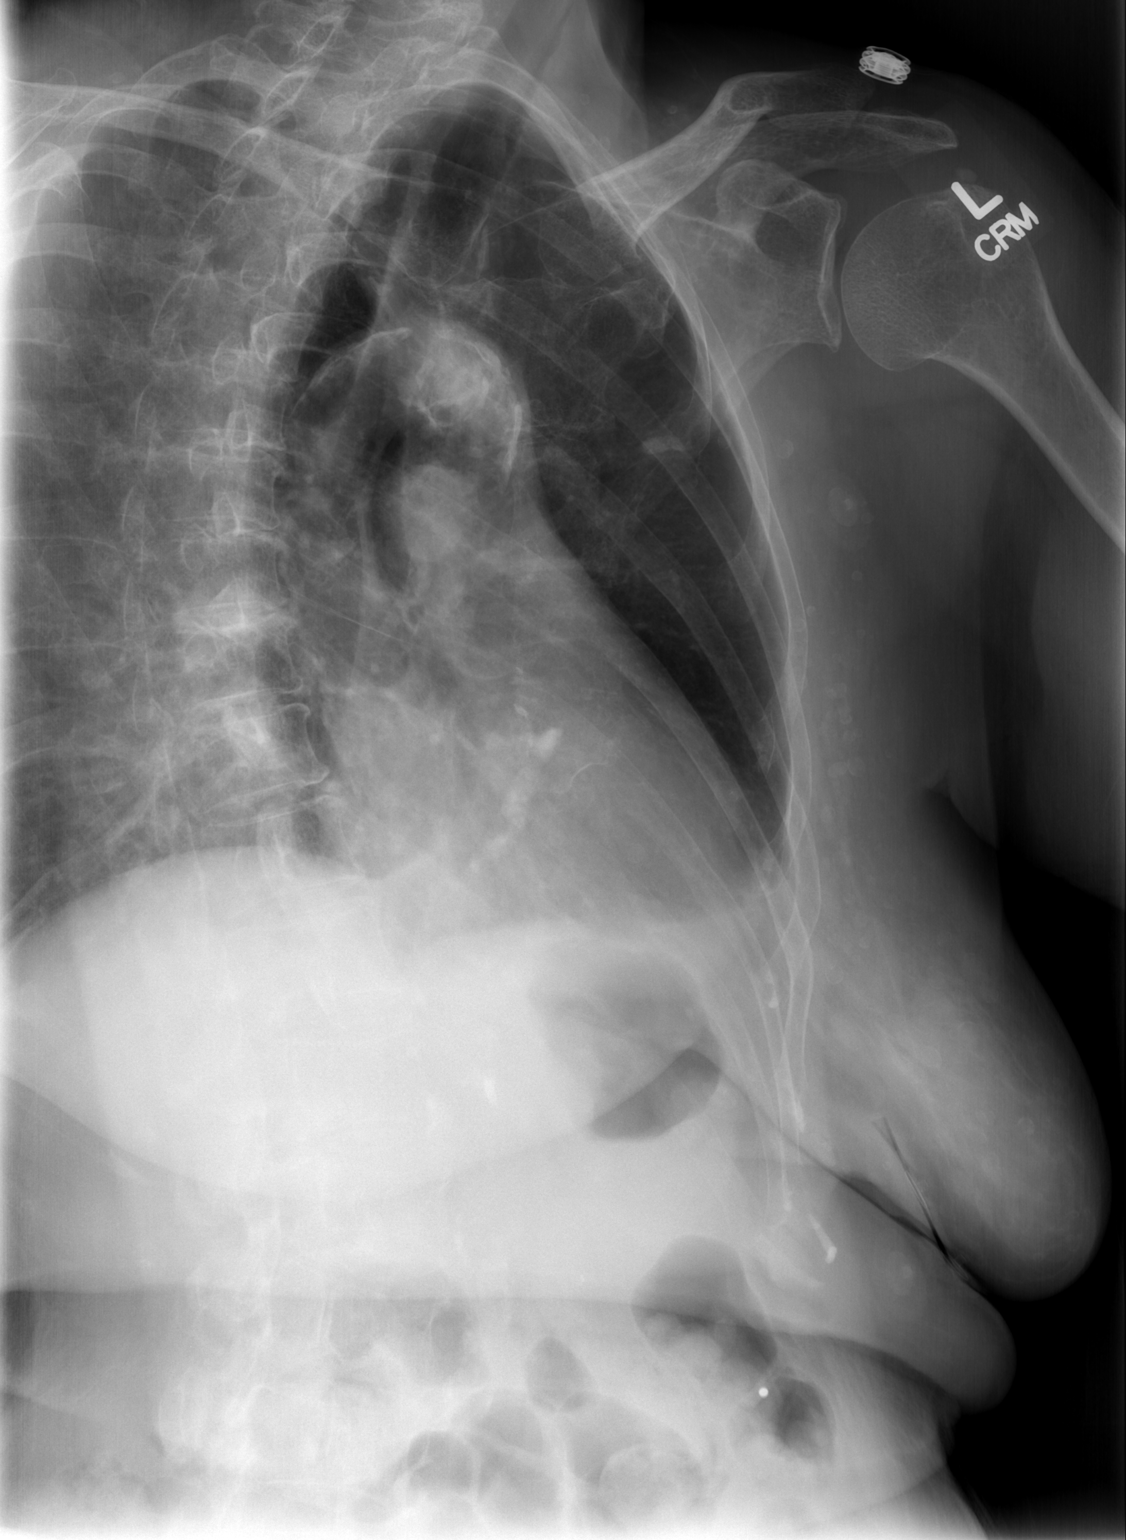

[3 of 3 positions shown; findings below may reference images not displayed]

FINDINGS: There is a small LEFT pleural effusion which is new compared to
prior studies. No left-sided pneumothorax. Faint dystrophic
calcifications are present overlying the LEFT thoracic inlet which
appear chronic.

The LEFT eighth and ninth ribs show buckling compatible with
nondisplaced fractures. These are on the lateral margins of the
ribs, superior to the area of marker.

There is opacity in the RIGHT mid lung which appears more prominent
than on prior exam. Given the findings in the LEFT chest, this may
represent atelectasis and scarring however a followup Chest
radiograph in 8 weeks is recommended to reassess.

The cardiopericardial silhouette is within normal limits. Aortic
arch atherosclerosis.
IMPRESSION: 1. Nondisplaced LEFT eighth and ninth lateral rib fractures.
2. No pneumothorax. Small likely reactive LEFT pleural effusion with
LEFT basilar atelectasis.
3. New ill-defined opacity in the RIGHT mid lung compared to prior
chest radiographs. This may represent atelectasis and/or scarring
however a followup radiograph in 8 weeks is recommended to reassess.

## 2014-10-25 MED ORDER — LISINOPRIL 20 MG PO TABS: 20.0000 mg | ORAL_TABLET | Freq: Two times a day (BID) | ORAL | Status: DC

## 2014-12-08 ENCOUNTER — Telehealth: Payer: Self-pay | Admitting: Internal Medicine

## 2014-12-08 NOTE — Telephone Encounter (Signed)
[facility] states they sent fax on 1/11 for  Test strips, lancets  and a new meter for pt. Have you seen anything?

## 2014-12-12 NOTE — Telephone Encounter (Signed)
Spoke to pt, told her I received a fax from [facility] for diabetic supplies. Asked pt if she is wanting to use them? Pt stated no she gets her supplies from [facility]. Told pt okay.

## 2014-12-21 ENCOUNTER — Ambulatory Visit (INDEPENDENT_AMBULATORY_CARE_PROVIDER_SITE_OTHER): Payer: Medicare Other | Admitting: Internal Medicine

## 2014-12-21 ENCOUNTER — Encounter: Payer: Self-pay | Admitting: Internal Medicine

## 2014-12-21 DIAGNOSIS — I10 Essential (primary) hypertension: Secondary | ICD-10-CM

## 2014-12-21 DIAGNOSIS — Z23 Encounter for immunization: Secondary | ICD-10-CM | POA: Diagnosis not present

## 2014-12-21 DIAGNOSIS — E785 Hyperlipidemia, unspecified: Secondary | ICD-10-CM

## 2014-12-21 DIAGNOSIS — E119 Type 2 diabetes mellitus without complications: Secondary | ICD-10-CM

## 2014-12-21 LAB — LIPID PANEL
CHOLESTEROL: 161 mg/dL (ref 0–200)
HDL: 51.4 mg/dL (ref >39.00)
NonHDL: 109.6
Total CHOL/HDL Ratio: 3
Triglycerides: 257 mg/dL — ABNORMAL HIGH (ref 0.0–149.0)
VLDL: 51.4 mg/dL — ABNORMAL HIGH (ref 0.0–40.0)

## 2014-12-21 LAB — HEMOGLOBIN A1C: Hgb A1c MFr Bld: 6.5 % (ref 4.6–6.5)

## 2014-12-21 LAB — MICROALBUMIN / CREATININE URINE RATIO
Creatinine,U: 41.3 mg/dL
Microalb Creat Ratio: 1.7 mg/g (ref 0.0–30.0)
Microalb, Ur: <0.7 mg/dL (ref 0.0–1.9)

## 2014-12-21 LAB — LDL CHOLESTEROL, DIRECT: Direct LDL: 83 mg/dL

## 2014-12-21 NOTE — Progress Notes (Signed)
Subjective:    Patient ID: [name], female    DOB: 01-Dec-1933, 81 y.o.   MRN: [id number]  HPI   81 year old patient who is seen today for follow-up of type 2 diabetes.  This has been very well controlled on low-dose metformin therapy alone.  She has a history of ongoing tobacco use.  Essential hypertension and dyslipidemia.  She remains on atorvastatin, which he tolerates well.  No new concerns or complaints.  She does receive annual eye examinations  Past Medical History  Diagnosis Date  . Diabetes mellitus, type 2   . Hyperlipidemia   . Hypertension   . History of cervical cancer   . TN (trigeminal neuralgia)   . Raynaud's phenomenon     History   Social History  . Marital Status: Married    Spouse Name: [name]    Number of Children: 1  . Years of Education: N/A   Occupational History  . Unemployed    Social History Main Topics  . Smoking status: Current Every Day Smoker -- 1.50 packs/day for 60 years    Types: Cigarettes  . Smokeless tobacco: Never Used  . Alcohol Use: No  . Drug Use: No  . Sexual Activity: Not on file   Other Topics Concern  . Not on file   Social History Narrative   Married.  Lives in [place] with her husband.  No FH of heart disease.    Past Surgical History  Procedure Laterality Date  . Breast biopsy    . Breast enhancement surgery    . Total abdominal hysterectomy  1963    for cervical cancer in situ  . Cholecystectomy N/A 02/14/2014    Procedure: LAPAROSCOPIC CHOLECYSTECTOMY;  Surgeon: Ralene Ok, MD;  Location: [facility];  Service: General;  Laterality: N/A;    No family history on file.  Allergies  Allergen Reactions  . Morphine And Related Itching    Short lived, mild itching.    Current Outpatient Prescriptions on File Prior to Visit  Medication Sig Dispense Refill  . aspirin EC 81 MG tablet Take 81 mg by mouth at bedtime.    [name] atorvastatin (LIPITOR) 20 MG tablet TAKE ONE TABLET BY MOUTH ONCE DAILY IN  THE MORNING 90 tablet 1  . hydrochlorothiazide (HYDRODIURIL) 12.5 MG tablet Take 1 tablet (12.5 mg total) by mouth daily. 90 tablet 3  . lisinopril (PRINIVIL,ZESTRIL) 20 MG tablet Take 1 tablet (20 mg total) by mouth 2 (two) times daily. 90 tablet 1  . metFORMIN (GLUCOPHAGE) 500 MG tablet TAKE ONE TABLET BY MOUTH TWICE DAILY WITH FOOD 180 tablet 1  . Multiple Vitamins-Minerals (WOMENS 50+ MULTI VITAMIN/MIN PO) Take 1 tablet by mouth every morning.     . ONE TOUCH ULTRA TEST test strip USE TO TEST BLOOD SUGAR DAILY AS DIRECTED 100 each 12  . tetrahydrozoline-zinc (VISINE-AC) 0.05-0.25 % ophthalmic solution Place 1 drop into both eyes daily as needed.     No current facility-administered medications on file prior to visit.    BP 138/80 mmHg  Pulse 80  Temp(Src) 97.9 F (36.6 C) (Oral)  Resp 18  Ht 5' (1.524 m)  Wt 102 lb (46.267 kg)  BMI 19.92 kg/m2     Review of Systems  Constitutional: Negative.   HENT: Negative for congestion, dental problem, hearing loss, rhinorrhea, sinus pressure, sore throat and tinnitus.   Eyes: Negative for pain, discharge and visual disturbance.  Respiratory: Negative for cough and shortness of breath.   Cardiovascular: Negative for  chest pain, palpitations and leg swelling.  Gastrointestinal: Negative for nausea, vomiting, abdominal pain, diarrhea, constipation, blood in stool and abdominal distention.  Genitourinary: Negative for dysuria, urgency, frequency, hematuria, flank pain, vaginal bleeding, vaginal discharge, difficulty urinating, vaginal pain and pelvic pain.  Musculoskeletal: Negative for joint swelling, arthralgias and gait problem.  Skin: Negative for rash.  Neurological: Negative for dizziness, syncope, speech difficulty, weakness, numbness and headaches.  Hematological: Negative for adenopathy.  Psychiatric/Behavioral: Negative for behavioral problems, dysphoric mood and agitation. The patient is not nervous/anxious.        Objective:    Physical Exam  Constitutional: She is oriented to person, place, and time. She appears well-developed and well-nourished.  HENT:  Head: Normocephalic.  Right Ear: External ear normal.  Left Ear: External ear normal.  Mouth/Throat: Oropharynx is clear and moist.  Eyes: Conjunctivae and EOM are normal. Pupils are equal, round, and reactive to light.  Neck: Normal range of motion. Neck supple. No thyromegaly present.  Cardiovascular: Normal rate, regular rhythm and normal heart sounds.   Decreased left posterior tibial pulse.  Other pedal pulses full  Pulmonary/Chest: Effort normal and breath sounds normal.  Abdominal: Soft. Bowel sounds are normal. She exhibits no mass. There is no tenderness.  Musculoskeletal: Normal range of motion.  Lymphadenopathy:    She has no cervical adenopathy.  Neurological: She is alert and oriented to person, place, and time.  Skin: Skin is warm and dry. No rash noted.  Psychiatric: She has a normal mood and affect. Her behavior is normal.          Assessment & Plan:   Diabetes mellitus.  Will check a hemoglobin A1c Hypertension, well-controlled Dyslipidemia.  Will check a lipid profile  Return in 6 months for follow-up Medications updated

## 2014-12-21 NOTE — Progress Notes (Signed)
Pre visit review using our clinic review tool, if applicable. No additional management support is needed unless otherwise documented below in the visit note. 

## 2014-12-21 NOTE — Patient Instructions (Signed)
Limit your sodium (Salt) intake   Please check your hemoglobin A1c every 3 months  Smoking tobacco is very bad for your health. You should stop smoking immediately.

## 2014-12-23 ENCOUNTER — Telehealth: Payer: Self-pay | Admitting: Internal Medicine

## 2014-12-23 NOTE — Telephone Encounter (Signed)
emmi mailed  °

## 2015-01-02 ENCOUNTER — Telehealth: Payer: Self-pay | Admitting: Internal Medicine

## 2015-01-02 NOTE — Telephone Encounter (Signed)
[name], pt needs to call her insurance and find out what meter is covered and then I can send to pharmacy for her.

## 2015-01-02 NOTE — Telephone Encounter (Signed)
Pt states her meter is 79 yrs old and she needs a new meter.  Some company called her and said they will send her one, but she needs to call her dr and tell us it is ok,, but I advised pt we can get her a new meter from her pharm.  Is that ok? [facility]

## 2015-01-03 NOTE — Telephone Encounter (Addendum)
Pt needs a one touch meter, w/ diagnosis code and frequency of testing. Per her insurance [name].

## 2015-01-10 MED ORDER — ONETOUCH ULTRA SYSTEM W/DEVICE KIT
PACK | Status: DC
Start: 2015-01-10 — End: 2016-10-01

## 2015-01-10 NOTE — Telephone Encounter (Signed)
Left message on voicemail to call office.  

## 2015-01-10 NOTE — Telephone Encounter (Signed)
Pt called back told her Rx for One Touch Meter sent to pharmacy. Pt verbalized understanding.

## 2015-01-11 ENCOUNTER — Other Ambulatory Visit: Payer: Self-pay | Admitting: Internal Medicine

## 2015-01-12 ENCOUNTER — Other Ambulatory Visit: Payer: Self-pay | Admitting: Internal Medicine

## 2015-01-13 ENCOUNTER — Other Ambulatory Visit: Payer: Self-pay | Admitting: Internal Medicine

## 2015-01-13 MED ORDER — ATORVASTATIN CALCIUM 20 MG PO TABS
ORAL_TABLET | ORAL | Status: DC
Start: 2015-01-13 — End: 2015-03-10

## 2015-01-13 NOTE — Telephone Encounter (Signed)
Rx sent to pharmacy   

## 2015-01-13 NOTE — Telephone Encounter (Signed)
[id number] - [place], [address] [place] [phone] is requesting re-fill on atorvastatin (LIPITOR) 20 MG tablet

## 2015-01-23 ENCOUNTER — Other Ambulatory Visit (HOSPITAL_COMMUNITY): Payer: Self-pay | Admitting: Cardiology

## 2015-01-23 DIAGNOSIS — I6523 Occlusion and stenosis of bilateral carotid arteries: Secondary | ICD-10-CM

## 2015-02-03 ENCOUNTER — Ambulatory Visit (HOSPITAL_COMMUNITY): Payer: Medicare Other | Attending: Internal Medicine | Admitting: Cardiology

## 2015-02-03 DIAGNOSIS — Z720 Tobacco use: Secondary | ICD-10-CM | POA: Insufficient documentation

## 2015-02-03 DIAGNOSIS — I10 Essential (primary) hypertension: Secondary | ICD-10-CM | POA: Diagnosis not present

## 2015-02-03 DIAGNOSIS — E119 Type 2 diabetes mellitus without complications: Secondary | ICD-10-CM | POA: Insufficient documentation

## 2015-02-03 DIAGNOSIS — E785 Hyperlipidemia, unspecified: Secondary | ICD-10-CM | POA: Insufficient documentation

## 2015-02-03 DIAGNOSIS — I6523 Occlusion and stenosis of bilateral carotid arteries: Secondary | ICD-10-CM

## 2015-02-03 NOTE — Progress Notes (Signed)
Carotid duplex performed 

## 2015-03-06 ENCOUNTER — Inpatient Hospital Stay (HOSPITAL_COMMUNITY): Payer: Medicare Other

## 2015-03-06 ENCOUNTER — Inpatient Hospital Stay (HOSPITAL_COMMUNITY)
Admission: EM | Admit: 2015-03-06 | Discharge: 2015-03-10 | DRG: 410 | Disposition: A | Discharge status: Home / Self Care | Payer: Medicare Other | Attending: Internal Medicine | Admitting: Internal Medicine

## 2015-03-06 ENCOUNTER — Telehealth: Payer: Self-pay | Admitting: Internal Medicine

## 2015-03-06 ENCOUNTER — Emergency Department (HOSPITAL_COMMUNITY): Payer: Medicare Other

## 2015-03-06 ENCOUNTER — Encounter (HOSPITAL_COMMUNITY): Payer: Self-pay | Admitting: Emergency Medicine

## 2015-03-06 DIAGNOSIS — R0789 Other chest pain: Secondary | ICD-10-CM | POA: Diagnosis not present

## 2015-03-06 DIAGNOSIS — R1010 Upper abdominal pain, unspecified: Secondary | ICD-10-CM | POA: Diagnosis not present

## 2015-03-06 DIAGNOSIS — E780 Pure hypercholesterolemia: Secondary | ICD-10-CM | POA: Diagnosis present

## 2015-03-06 DIAGNOSIS — K839 Disease of biliary tract, unspecified: Secondary | ICD-10-CM | POA: Diagnosis not present

## 2015-03-06 DIAGNOSIS — R7401 Elevation of levels of liver transaminase levels: Secondary | ICD-10-CM | POA: Diagnosis present

## 2015-03-06 DIAGNOSIS — R079 Chest pain, unspecified: Secondary | ICD-10-CM

## 2015-03-06 DIAGNOSIS — I10 Essential (primary) hypertension: Secondary | ICD-10-CM | POA: Diagnosis present

## 2015-03-06 DIAGNOSIS — I2510 Atherosclerotic heart disease of native coronary artery without angina pectoris: Secondary | ICD-10-CM | POA: Diagnosis present

## 2015-03-06 DIAGNOSIS — G500 Trigeminal neuralgia: Secondary | ICD-10-CM | POA: Diagnosis present

## 2015-03-06 DIAGNOSIS — R935 Abnormal findings on diagnostic imaging of other abdominal regions, including retroperitoneum: Secondary | ICD-10-CM

## 2015-03-06 DIAGNOSIS — R630 Anorexia: Secondary | ICD-10-CM | POA: Diagnosis not present

## 2015-03-06 DIAGNOSIS — L723 Sebaceous cyst: Secondary | ICD-10-CM | POA: Diagnosis present

## 2015-03-06 DIAGNOSIS — E785 Hyperlipidemia, unspecified: Secondary | ICD-10-CM | POA: Diagnosis present

## 2015-03-06 DIAGNOSIS — Z9049 Acquired absence of other specified parts of digestive tract: Secondary | ICD-10-CM | POA: Diagnosis present

## 2015-03-06 DIAGNOSIS — Z90710 Acquired absence of both cervix and uterus: Secondary | ICD-10-CM

## 2015-03-06 DIAGNOSIS — E119 Type 2 diabetes mellitus without complications: Secondary | ICD-10-CM | POA: Diagnosis not present

## 2015-03-06 DIAGNOSIS — R7989 Other specified abnormal findings of blood chemistry: Secondary | ICD-10-CM | POA: Diagnosis not present

## 2015-03-06 DIAGNOSIS — R740 Nonspecific elevation of levels of transaminase and lactic acid dehydrogenase [LDH]: Secondary | ICD-10-CM

## 2015-03-06 DIAGNOSIS — K838 Other specified diseases of biliary tract: Secondary | ICD-10-CM | POA: Diagnosis not present

## 2015-03-06 DIAGNOSIS — K851 Biliary acute pancreatitis without necrosis or infection: Secondary | ICD-10-CM | POA: Diagnosis present

## 2015-03-06 DIAGNOSIS — Z7982 Long term (current) use of aspirin: Secondary | ICD-10-CM | POA: Diagnosis not present

## 2015-03-06 DIAGNOSIS — I7300 Raynaud's syndrome without gangrene: Secondary | ICD-10-CM | POA: Diagnosis present

## 2015-03-06 DIAGNOSIS — K859 Acute pancreatitis, unspecified: Principal | ICD-10-CM | POA: Diagnosis present

## 2015-03-06 DIAGNOSIS — K868 Other specified diseases of pancreas: Secondary | ICD-10-CM | POA: Diagnosis not present

## 2015-03-06 DIAGNOSIS — R1013 Epigastric pain: Secondary | ICD-10-CM | POA: Diagnosis not present

## 2015-03-06 DIAGNOSIS — Z720 Tobacco use: Secondary | ICD-10-CM | POA: Diagnosis present

## 2015-03-06 DIAGNOSIS — R945 Abnormal results of liver function studies: Secondary | ICD-10-CM | POA: Insufficient documentation

## 2015-03-06 DIAGNOSIS — Z885 Allergy status to narcotic agent status: Secondary | ICD-10-CM

## 2015-03-06 DIAGNOSIS — F17210 Nicotine dependence, cigarettes, uncomplicated: Secondary | ICD-10-CM | POA: Diagnosis present

## 2015-03-06 DIAGNOSIS — K8080 Other cholelithiasis without obstruction: Secondary | ICD-10-CM | POA: Diagnosis not present

## 2015-03-06 DIAGNOSIS — K8050 Calculus of bile duct without cholangitis or cholecystitis without obstruction: Secondary | ICD-10-CM

## 2015-03-06 DIAGNOSIS — Z8541 Personal history of malignant neoplasm of cervix uteri: Secondary | ICD-10-CM

## 2015-03-06 LAB — GLUCOSE, CAPILLARY
GLUCOSE-CAPILLARY: 173 mg/dL — AB (ref 70–99)
Glucose-Capillary: 54 mg/dL — ABNORMAL LOW (ref 70–99)
Glucose-Capillary: 214 mg/dL — ABNORMAL HIGH (ref 70–99)

## 2015-03-06 LAB — I-STAT TROPONIN, ED
Troponin i, poc: 0 ng/mL (ref 0.00–0.08)
Troponin i, poc: 0 ng/mL (ref 0.00–0.08)

## 2015-03-06 LAB — HEPATIC FUNCTION PANEL
ALK PHOS: 199 U/L — AB (ref 39–117)
ALT: 375 U/L — AB (ref 0–35)
AST: 476 U/L — ABNORMAL HIGH (ref 0–37)
Albumin: 4 g/dL (ref 3.5–5.2)
BILIRUBIN DIRECT: 0.3 mg/dL (ref 0.0–0.5)
BILIRUBIN TOTAL: 0.9 mg/dL (ref 0.3–1.2)
Indirect Bilirubin: 0.6 mg/dL (ref 0.3–0.9)
Total Protein: 7.1 g/dL (ref 6.0–8.3)

## 2015-03-06 LAB — CBC
HEMATOCRIT: 45.5 % (ref 36.0–46.0)
Hemoglobin: 15 g/dL (ref 12.0–15.0)
MCH: 31.4 pg (ref 26.0–34.0)
MCHC: 33 g/dL (ref 30.0–36.0)
MCV: 95.2 fL (ref 78.0–100.0)
Platelets: 246 10*3/uL (ref 150–400)
RBC: 4.78 MIL/uL (ref 3.87–5.11)
RDW: 14 % (ref 11.5–15.5)
WBC: 7 10*3/uL (ref 4.0–10.5)

## 2015-03-06 LAB — BASIC METABOLIC PANEL
ANION GAP: 9 (ref 5–15)
BUN: 13 mg/dL (ref 6–23)
CHLORIDE: 103 mmol/L (ref 96–112)
CO2: 27 mmol/L (ref 19–32)
CREATININE: 0.95 mg/dL (ref 0.50–1.10)
Calcium: 9.5 mg/dL (ref 8.4–10.5)
GFR calc Af Amer: 64 mL/min — ABNORMAL LOW (ref >90)
GFR calc non Af Amer: 55 mL/min — ABNORMAL LOW (ref >90)
Glucose, Bld: 101 mg/dL — ABNORMAL HIGH (ref 70–99)
Potassium: 3.8 mmol/L (ref 3.5–5.1)
Sodium: 139 mmol/L (ref 135–145)

## 2015-03-06 LAB — LIPASE, BLOOD: Lipase: 127 U/L — ABNORMAL HIGH (ref 11–59)

## 2015-03-06 LAB — TROPONIN I

## 2015-03-06 MED ORDER — INSULIN ASPART 100 UNIT/ML ~~LOC~~ SOLN
0.0000 [IU] | SUBCUTANEOUS | Status: DC
  Administered 2015-03-07 (×2): 1 [IU] via SUBCUTANEOUS
  Administered 2015-03-07: 3 [IU] via SUBCUTANEOUS
  Administered 2015-03-08: 1 [IU] via SUBCUTANEOUS
  Administered 2015-03-08 (×2): 2 [IU] via SUBCUTANEOUS
  Administered 2015-03-09: 1 [IU] via SUBCUTANEOUS
  Administered 2015-03-09: 2 [IU] via SUBCUTANEOUS
  Administered 2015-03-09: 1 [IU] via SUBCUTANEOUS

## 2015-03-06 MED ORDER — DEXTROSE-NACL 5-0.45 % IV SOLN
INTRAVENOUS | Status: AC
  Administered 2015-03-06 – 2015-03-07 (×3): via INTRAVENOUS

## 2015-03-06 MED ORDER — ACETAMINOPHEN 650 MG RE SUPP: 650.0000 mg | Freq: Four times a day (QID) | RECTAL | Status: DC | PRN

## 2015-03-06 MED ORDER — ALBUTEROL SULFATE (2.5 MG/3ML) 0.083% IN NEBU: 2.5000 mg | INHALATION_SOLUTION | RESPIRATORY_TRACT | Status: DC | PRN

## 2015-03-06 MED ORDER — DEXTROSE 50 % IV SOLN
1.0000 | Freq: Once | INTRAVENOUS | Status: AC
  Administered 2015-03-06: 50 mL via INTRAVENOUS

## 2015-03-06 MED ORDER — SODIUM CHLORIDE 0.9 % IV BOLUS (SEPSIS)
500.0000 mL | Freq: Once | INTRAVENOUS | Status: AC
  Administered 2015-03-06: 500 mL via INTRAVENOUS

## 2015-03-06 MED ORDER — ONDANSETRON HCL 4 MG/2ML IJ SOLN
4.0000 mg | Freq: Three times a day (TID) | INTRAMUSCULAR | Status: DC | PRN
  Administered 2015-03-06: 4 mg via INTRAVENOUS
  Filled 2015-03-06: qty 2

## 2015-03-06 MED ORDER — SODIUM CHLORIDE 0.9 % IV SOLN
INTRAVENOUS | Status: AC
  Administered 2015-03-06: 16:00:00 via INTRAVENOUS

## 2015-03-06 MED ORDER — ONDANSETRON HCL 4 MG PO TABS: 4.0000 mg | ORAL_TABLET | Freq: Four times a day (QID) | ORAL | Status: DC | PRN

## 2015-03-06 MED ORDER — ONDANSETRON HCL 4 MG/2ML IJ SOLN
4.0000 mg | Freq: Four times a day (QID) | INTRAMUSCULAR | Status: DC | PRN
  Administered 2015-03-08: 4 mg via INTRAVENOUS
  Filled 2015-03-06: qty 2

## 2015-03-06 MED ORDER — FAMOTIDINE 20 MG PO TABS
20.0000 mg | ORAL_TABLET | Freq: Once | ORAL | Status: AC
  Administered 2015-03-06: 20 mg via ORAL
  Filled 2015-03-06: qty 1

## 2015-03-06 MED ORDER — HYDRALAZINE HCL 20 MG/ML IJ SOLN
10.0000 mg | Freq: Four times a day (QID) | INTRAMUSCULAR | Status: DC | PRN
  Administered 2015-03-06 – 2015-03-09 (×2): 10 mg via INTRAVENOUS
  Filled 2015-03-06 (×2): qty 1

## 2015-03-06 MED ORDER — SODIUM CHLORIDE 0.9 % IJ SOLN: 3.0000 mL | Freq: Two times a day (BID) | INTRAMUSCULAR | Status: DC

## 2015-03-06 MED ORDER — HYDROMORPHONE HCL 1 MG/ML IJ SOLN: 0.5000 mg | INTRAMUSCULAR | Status: DC | PRN

## 2015-03-06 MED ORDER — NICOTINE 14 MG/24HR TD PT24
14.0000 mg | MEDICATED_PATCH | Freq: Every day | TRANSDERMAL | Status: DC
  Administered 2015-03-07 – 2015-03-09 (×3): 14 mg via TRANSDERMAL
  Filled 2015-03-06 (×5): qty 1

## 2015-03-06 MED ORDER — ACETAMINOPHEN 325 MG PO TABS: 650.0000 mg | ORAL_TABLET | Freq: Four times a day (QID) | ORAL | Status: DC | PRN

## 2015-03-06 MED ORDER — GI COCKTAIL ~~LOC~~
30.0000 mL | Freq: Once | ORAL | Status: AC
  Administered 2015-03-06: 30 mL via ORAL
  Filled 2015-03-06: qty 30

## 2015-03-06 MED ORDER — DEXTROSE-NACL 5-0.45 % IV SOLN
INTRAVENOUS | Status: DC
  Administered 2015-03-08 – 2015-03-10 (×7): via INTRAVENOUS

## 2015-03-06 MED ORDER — DEXTROSE 50 % IV SOLN
INTRAVENOUS | Status: AC
  Administered 2015-03-06: 50 mL via INTRAVENOUS
  Filled 2015-03-06: qty 50

## 2015-03-06 NOTE — ED Provider Notes (Signed)
CSN: [id number]     Arrival date & time 03/06/15  1042 History   First MD Initiated Contact with Patient 03/06/15 1236     Chief Complaint  Patient presents with  . Chest Pain     (Consider location/radiation/quality/duration/timing/severity/associated sxs/prior Treatment) HPI Comments: 81 year old female with history of diabetes, Raynauds pancreatitis, atypical chest pain, CAD presents with recurrent anterior chest discomfort and epigastric discomfort since Saturday morning. It's worse at night. No exertional or diaphoretic components. Fairly constant since then. No cardiac stents or known heart attack or significant cardiac history. No cardiologist. Patient currently minimal symptoms. Patient said improved in the past when on gallbladder/stomach medications. Patient gallbladder removed in the past.  Patient is a 81 y.o. female presenting with chest pain. The history is provided by the patient.  Chest Pain Associated symptoms: cough   Associated symptoms: no abdominal pain, no back pain, no fever, no headache, no shortness of breath and not vomiting     Past Medical History  Diagnosis Date  . Diabetes mellitus, type 2   . Hyperlipidemia   . Hypertension   . History of cervical cancer   . TN (trigeminal neuralgia)   . Raynaud's phenomenon    Past Surgical History  Procedure Laterality Date  . Breast biopsy    . Breast enhancement surgery    . Total abdominal hysterectomy  1963    for cervical cancer in situ  . Cholecystectomy N/A 02/14/2014    Procedure: LAPAROSCOPIC CHOLECYSTECTOMY;  Surgeon: Ralene Ok, MD;  Location: [facility];  Service: General;  Laterality: N/A;   History reviewed. No pertinent family history. History  Substance Use Topics  . Smoking status: Current Every Day Smoker -- 1.50 packs/day for 60 years    Types: Cigarettes  . Smokeless tobacco: Never Used  . Alcohol Use: No   OB History    No data available     Review of Systems  Constitutional:  Negative for fever and chills.  HENT: Negative for congestion.   Eyes: Negative for visual disturbance.  Respiratory: Positive for cough. Negative for shortness of breath.   Cardiovascular: Positive for chest pain.  Gastrointestinal: Negative for vomiting and abdominal pain.  Genitourinary: Negative for dysuria and flank pain.  Musculoskeletal: Negative for back pain, neck pain and neck stiffness.  Skin: Negative for rash.  Neurological: Negative for light-headedness and headaches.      Allergies  Morphine and related  Home Medications   Prior to Admission medications   Medication Sig Start Date End Date Taking? Authorizing Provider  aspirin EC 81 MG tablet Take 81 mg by mouth at bedtime.   Yes Historical Provider, MD  atorvastatin (LIPITOR) 20 MG tablet TAKE ONE TABLET BY MOUTH ONCE DAILY IN THE MORNING Patient taking differently: Take 20 mg by mouth daily.  01/13/15  Yes Marletta Lor, MD  Blood Glucose Monitoring Suppl (ONE TOUCH ULTRA SYSTEM KIT) W/DEVICE KIT USE TO CHECK BLOOD SUGAR DAILY AND RPN 01/10/15  Yes Marletta Lor, MD  hydrochlorothiazide (HYDRODIURIL) 12.5 MG tablet Take 1 tablet (12.5 mg total) by mouth daily. 03/16/14  Yes Marletta Lor, MD  ibuprofen (ADVIL,MOTRIN) 200 MG tablet Take 200 mg by mouth every 6 (six) hours as needed for mild pain or moderate pain.    Yes Historical Provider, MD  lisinopril (PRINIVIL,ZESTRIL) 20 MG tablet Take 1 tablet (20 mg total) by mouth 2 (two) times daily. 10/25/14  Yes Marletta Lor, MD  metFORMIN (GLUCOPHAGE) 500 MG tablet TAKE ONE TABLET  BY MOUTH TWICE DAILY WITH FOOD 10/25/14  Yes Marletta Lor, MD  Multiple Vitamins-Minerals (WOMENS 50+ [place] VITAMIN/MIN PO) Take 1 tablet by mouth every morning.    Yes Historical Provider, MD  ONE TOUCH ULTRA TEST test strip USE TO TEST BLOOD SUGAR DAILY AS DIRECTED 10/25/14  Yes Marletta Lor, MD  tetrahydrozoline-zinc (VISINE-AC) 0.05-0.25 % ophthalmic solution  Place 1 drop into both eyes daily as needed (irritation).    Yes Historical Provider, MD  lisinopril (PRINIVIL,ZESTRIL) 20 MG tablet TAKE 1 TABLET BY MOUTH TWICE DAILY 01/12/15   Marletta Lor, MD  metFORMIN (GLUCOPHAGE) 500 MG tablet TAKE 1 TABLET BY MOUTH TWICE DAILY WITH A MEAL 01/12/15   Marletta Lor, MD   BP 200/56 mmHg  Pulse 71  Resp 17  SpO2 96% Physical Exam  Constitutional: She is oriented to person, place, and time. She appears well-developed and well-nourished.  HENT:  Head: Normocephalic and atraumatic.  Eyes: Conjunctivae are normal. Right eye exhibits no discharge. Left eye exhibits no discharge.  Neck: Normal range of motion. Neck supple. No tracheal deviation present.  Cardiovascular: Normal rate and regular rhythm.   Pulmonary/Chest: Effort normal and breath sounds normal.  Abdominal: Soft. She exhibits no distension. There is no tenderness. There is no guarding.  Musculoskeletal: She exhibits no edema.  Neurological: She is alert and oriented to person, place, and time.  Skin: Skin is warm. No rash noted.  Psychiatric: She has a normal mood and affect.  Nursing note and vitals reviewed.   ED Course  Procedures (including critical care time) Labs Review Labs Reviewed  BASIC METABOLIC PANEL - Abnormal; Notable for the following:    Glucose, Bld 101 (*)    GFR calc non Af Amer 55 (*)    GFR calc Af Amer 64 (*)    All other components within normal limits  HEPATIC FUNCTION PANEL - Abnormal; Notable for the following:    AST 476 (*)    ALT 375 (*)    Alkaline Phosphatase 199 (*)    All other components within normal limits  LIPASE, BLOOD - Abnormal; Notable for the following:    Lipase 127 (*)    All other components within normal limits  CBC  I-STAT TROPOININ, ED  Randolm Idol, ED    Imaging Review Dg Chest 2 View  03/06/2015   CLINICAL DATA:  Central chest pain for 3 days  EXAM: CHEST  2 VIEW  COMPARISON:  08/02/2014  FINDINGS: Cardiac  shadow is stable. The lungs are well aerated bilaterally. No focal infiltrate or sizable effusion is seen. No acute bony abnormality is noted. Diffuse breast calcifications are seen.  IMPRESSION: No acute abnormality noted.   Electronically Signed   By: Inez Catalina M.D.   On: 03/06/2015 13:03     EKG Interpretation   Date/Time:  Monday March 06 2015 10:47:44 EDT Ventricular Rate:  82 PR Interval:  129 QRS Duration: 85 QT Interval:  363 QTC Calculation: 424 R Axis:   -42 Text Interpretation:  Sinus rhythm Left anterior fascicular block RSR' in  V1 or V2, right VCD or RVH Confirmed by OTTER  MD, OLGA ([id number]) on  03/06/2015 10:50:23 AM      MDM   Final diagnoses:  Acute pancreatitis, unspecified pancreatitis type  LFT elevation  Chest pain, unspecified chest pain type   Well-appearing patient with recurrent atypical chest pain. EKG reviewed no acute findings, troponin negative. Patient minimal symptoms. GI medications given and if second  troponin negative discussed with patient close follow-up outpatient primary Dr. and cardiology for outpatient stress test.  With history of pancreatitis LFTs and lipase added, surprisingly all of them are elevated. Patient does not have a gallbladder. Patient's pain improved in ER. Discussed with triad hospitalist who agrees to assess the patient and admit for further evaluation, agrees with holding on CAT scan at this time.  Medications  0.9 %  sodium chloride infusion ( Intravenous New Bag/Given 03/06/15 1538)  ondansetron (ZOFRAN) injection 4 mg (4 mg Intravenous Given 03/06/15 1535)  famotidine (PEPCID) tablet 20 mg (20 mg Oral Given 03/06/15 1335)  gi cocktail (Maalox,Lidocaine,Donnatal) (30 mLs Oral Given 03/06/15 1337)  sodium chloride 0.9 % bolus 500 mL (500 mLs Intravenous New Bag/Given 03/06/15 1536)    Filed Vitals:   03/06/15 1051 03/06/15 1340  BP: 181/57 200/56  Pulse: 86 71  Resp: 22 17  SpO2: 97% 96%    Final diagnoses:  Acute  pancreatitis, unspecified pancreatitis type  LFT elevation  Chest pain, unspecified chest pain type        Elnora Morrison, MD 03/06/15 1554

## 2015-03-06 NOTE — Telephone Encounter (Signed)
[facility] - Client [facility]  Patient Name: [name]  DOB: 04-23-1934    Initial Comment Caller states she is having chest pain   Nurse Assessment  Nurse: Kenton Kingfisher, RN, [place] Date/Time (Eastern Time): 03/06/2015 9:20:36 AM  Confirm and document reason for call. If symptomatic, describe symptoms. ---Caller states she is having chest pain and started on Sunday and has taken medication but still having pain.  Has the patient traveled out of the country within the last 30 days? ---Not Applicable  Does the patient require triage? ---Yes  Related visit to physician within the last 2 weeks? ---No  Does the PT have any chronic conditions? (i.e. diabetes, asthma, etc.) ---Yes  List chronic conditions. ---Diabetes Hypertension Gallbladder medication (gallbladder has been removed)     Guidelines    Guideline Title Affirmed Question Affirmed Notes  Chest Pain [1] Chest pain lasts > 5 minutes AND [2] described as crushing, pressure-like, or heavy    Final Disposition User   Call EMS 911 Now Kenton Kingfisher, RN, [employer]

## 2015-03-06 NOTE — ED Notes (Signed)
Pt c/o central CP since Saturday.  States that she has smoked for 60 years.  Rhonchous cough noted.  Denies SOB.  States that "it looked like she had 3 boobs", meaning she had central chest swelling.  None noted now.

## 2015-03-06 NOTE — ED Notes (Signed)
MD at bedside. 

## 2015-03-06 NOTE — ED Notes (Signed)
Ultrasound at bedside

## 2015-03-06 NOTE — ED Notes (Signed)
MD GI at bedside

## 2015-03-06 NOTE — H&P (Signed)
[facility] History and Physical  [name] KYH:[id number] DOB: 07-11-1934 DOA: 03/06/2015   PCP: Nyoka Cowden, MD  Specialists: None  Chief Complaint: Upper abdominal pain/lower chest pain ongoing since Saturday  HPI: [name] is a 81 y.o. female with a past medical history of diabetes mellitus type 2, essential hypertension, hypercholesterolemia, who was in her usual state of health of this past Saturday when she woke up 3 in the morning with discomfort in the lower chest and upper abdominal area. She felt as if there was a swelling in that area. There was no radiation of the pain. The pain was 7-8 out of 10 in intensity. She is unable to characterize. She's had similar pain before. However, it never used to last more than an hour or 2. This time the pain persisted, although the intensity waxed and waned. She's had a cough for the last few weeks with whitish expectoration. She smokes cigarettes daily. Denies any history of fever. She has been nauseated but denies any vomiting. She hasn't had anything to eat or drink in the last 2 days. Denies any diarrhea. She had an episode of biliary pancreatitis about a year and half ago and had her gallbladder taken out in March 2015. Currently, she states that her pain is improved significantly.  Home Medications: Prior to Admission medications   Medication Sig Start Date End Date Taking? Authorizing Provider  aspirin EC 81 MG tablet Take 81 mg by mouth at bedtime.   Yes Historical Provider, MD  atorvastatin (LIPITOR) 20 MG tablet TAKE ONE TABLET BY MOUTH ONCE DAILY IN THE MORNING Patient taking differently: Take 20 mg by mouth daily.  01/13/15  Yes Marletta Lor, MD  Blood Glucose Monitoring Suppl (ONE TOUCH ULTRA SYSTEM KIT) W/DEVICE KIT USE TO CHECK BLOOD SUGAR DAILY AND RPN 01/10/15  Yes Marletta Lor, MD  hydrochlorothiazide (HYDRODIURIL) 12.5 MG tablet Take 1 tablet (12.5 mg total) by mouth  daily. 03/16/14  Yes Marletta Lor, MD  ibuprofen (ADVIL,MOTRIN) 200 MG tablet Take 200 mg by mouth every 6 (six) hours as needed for mild pain or moderate pain.    Yes Historical Provider, MD  lisinopril (PRINIVIL,ZESTRIL) 20 MG tablet Take 1 tablet (20 mg total) by mouth 2 (two) times daily. 10/25/14  Yes Marletta Lor, MD  metFORMIN (GLUCOPHAGE) 500 MG tablet TAKE ONE TABLET BY MOUTH TWICE DAILY WITH FOOD 10/25/14  Yes Marletta Lor, MD  Multiple Vitamins-Minerals (WOMENS 50+ [place] VITAMIN/MIN PO) Take 1 tablet by mouth every morning.    Yes Historical Provider, MD  ONE TOUCH ULTRA TEST test strip USE TO TEST BLOOD SUGAR DAILY AS DIRECTED 10/25/14  Yes Marletta Lor, MD  tetrahydrozoline-zinc (VISINE-AC) 0.05-0.25 % ophthalmic solution Place 1 drop into both eyes daily as needed (irritation).    Yes Historical Provider, MD  lisinopril (PRINIVIL,ZESTRIL) 20 MG tablet TAKE 1 TABLET BY MOUTH TWICE DAILY 01/12/15   Marletta Lor, MD  metFORMIN (GLUCOPHAGE) 500 MG tablet TAKE 1 TABLET BY MOUTH TWICE DAILY WITH A MEAL 01/12/15   Marletta Lor, MD    Allergies:  Allergies  Allergen Reactions  . Morphine And Related Itching    Short lived, mild itching.    Past Medical History: Past Medical History  Diagnosis Date  . Diabetes mellitus, type 2   . Hyperlipidemia   . Hypertension   . History of cervical cancer   . TN (trigeminal neuralgia)   . Raynaud's phenomenon     Past  Surgical History  Procedure Laterality Date  . Breast biopsy    . Breast enhancement surgery    . Total abdominal hysterectomy  1963    for cervical cancer in situ  . Cholecystectomy N/A 02/14/2014    Procedure: LAPAROSCOPIC CHOLECYSTECTOMY;  Surgeon: Ralene Ok, MD;  Location: [facility];  Service: General;  Laterality: N/A;    Social History: Patient lives in [place] with her husband who is very sick. Patient smokes one pack of cigarettes on a daily basis. No alcohol use. Denies any  illicit drug use. Usually independent with daily activities.  Family History:  She denies any health problems in her family.  Review of Systems - History obtained from the patient General ROS: positive for  - fatigue Psychological ROS: negative Ophthalmic ROS: negative ENT ROS: negative Allergy and Immunology ROS: negative Hematological and Lymphatic ROS: negative Endocrine ROS: negative Respiratory ROS: no cough, shortness of breath, or wheezing Cardiovascular ROS: as in hpi Gastrointestinal ROS: as in hpi Genito-Urinary ROS: no dysuria, trouble voiding, or hematuria Musculoskeletal ROS: negative Neurological ROS: no TIA or stroke symptoms Dermatological ROS: negative  Physical Examination  Filed Vitals:   03/06/15 1051 03/06/15 1340  BP: 181/57 200/56  Pulse: 86 71  Resp: 22 17  SpO2: 97% 96%    BP 200/56 mmHg  Pulse 71  Resp 17  SpO2 96%  General appearance: alert, cooperative, appears stated age and no distress Head: Cystic lesion appreciated in the right posterior scalp. According to the patient her PCP is monitoring this. Eyes: conjunctivae/corneas clear. PERRL, EOM's intact. Throat: lips, mucosa, and tongue normal; teeth and gums normal Neck: no adenopathy, no carotid bruit, no JVD, supple, symmetrical, trachea midline and thyroid not enlarged, symmetric, no tenderness/mass/nodules Resp: clear to auscultation bilaterally Cardio: regular rate and rhythm, S1, S2 normal, no murmur, click, rub or gallop GI: Soft. Slightly tender in the epigastric area without any rebound, rigidity or guarding. There is also minimal tenderness in the right upper quadrant. No masses or organomegaly. Bowel sounds are present. Extremities: extremities normal, atraumatic, no cyanosis or edema Pulses: 2+ and symmetric Skin: Skin color, texture, turgor normal. No rashes or lesions Lymph nodes: Cervical, supraclavicular, and axillary nodes normal. Neurologic: No focal deficits  Laboratory  Data: Results for orders placed or performed during the hospital encounter of 03/06/15 (from the past 48 hour(s))  CBC     Status: None   Collection Time: 03/06/15 10:54 AM  Result Value Ref Range   WBC 7.0 4.0 - 10.5 K/uL   RBC 4.78 3.87 - 5.11 MIL/uL   Hemoglobin 15.0 12.0 - 15.0 g/dL   HCT 45.5 36.0 - 46.0 %   MCV 95.2 78.0 - 100.0 fL   MCH 31.4 26.0 - 34.0 pg   MCHC 33.0 30.0 - 36.0 g/dL   RDW 14.0 11.5 - 15.5 %   Platelets 246 150 - 400 K/uL  Basic metabolic panel     Status: Abnormal   Collection Time: 03/06/15 10:54 AM  Result Value Ref Range   Sodium 139 135 - 145 mmol/L   Potassium 3.8 3.5 - 5.1 mmol/L   Chloride 103 96 - 112 mmol/L   CO2 27 19 - 32 mmol/L   Glucose, Bld 101 (H) 70 - 99 mg/dL   BUN 13 6 - 23 mg/dL   Creatinine, Ser 0.95 0.50 - 1.10 mg/dL   Calcium 9.5 8.4 - 10.5 mg/dL   GFR calc non Af Amer 55 (L) >90 mL/min   GFR calc  Af Amer 64 (L) >90 mL/min    Comment: (NOTE) The eGFR has been calculated using the CKD EPI equation. This calculation has not been validated in all clinical situations. eGFR's persistently <90 mL/min signify possible Chronic Kidney Disease.    Anion gap 9 5 - 15  I-stat troponin, ED (not at [facility])     Status: None   Collection Time: 03/06/15 11:01 AM  Result Value Ref Range   Troponin i, poc 0.00 0.00 - 0.08 ng/mL   Comment 3            Comment: Due to the release kinetics of cTnI, a negative result within the first hours of the onset of symptoms does not rule out myocardial infarction with certainty. If myocardial infarction is still suspected, repeat the test at appropriate intervals.   Hepatic function panel     Status: Abnormal   Collection Time: 03/06/15  1:34 PM  Result Value Ref Range   Total Protein 7.1 6.0 - 8.3 g/dL   Albumin 4.0 3.5 - 5.2 g/dL   AST 476 (H) 0 - 37 U/L   ALT 375 (H) 0 - 35 U/L   Alkaline Phosphatase 199 (H) 39 - 117 U/L   Total Bilirubin 0.9 0.3 - 1.2 mg/dL   Bilirubin, Direct 0.3 0.0 - 0.5 mg/dL    Indirect Bilirubin 0.6 0.3 - 0.9 mg/dL  Lipase, blood     Status: Abnormal   Collection Time: 03/06/15  1:34 PM  Result Value Ref Range   Lipase 127 (H) 11 - 59 U/L  I-stat troponin, ED     Status: None   Collection Time: 03/06/15  2:37 PM  Result Value Ref Range   Troponin i, poc 0.00 0.00 - 0.08 ng/mL   Comment 3            Comment: Due to the release kinetics of cTnI, a negative result within the first hours of the onset of symptoms does not rule out myocardial infarction with certainty. If myocardial infarction is still suspected, repeat the test at appropriate intervals.     Radiology Reports: Dg Chest 2 View  03/06/2015   CLINICAL DATA:  Central chest pain for 3 days  EXAM: CHEST  2 VIEW  COMPARISON:  08/02/2014  FINDINGS: Cardiac shadow is stable. The lungs are well aerated bilaterally. No focal infiltrate or sizable effusion is seen. No acute bony abnormality is noted. Diffuse breast calcifications are seen.  IMPRESSION: No acute abnormality noted.   Electronically Signed   By: Inez Catalina M.D.   On: 03/06/2015 13:03    Electrocardiogram: Sinus rhythm at 82 bpm. Left axis deviation. Normal intervals. No Q waves. No concerning ST or T-wave changes are noted.  Problem List  Principal Problem:   Acute pancreatitis Active Problems:   Diabetes mellitus without complication   Essential hypertension   Tobacco abuse   Transaminitis   Assessment: This is a 81 year old female who presents with the upper abdominal lower chest pain ongoing since Saturday. Troponin is normal. She has abnormal LFTs and an elevated lipase level. This is most likely pain secondary to pancreatitis. She is status post cholecystectomy. She could still be forming stones resulting in this presentation. She also gives history of similar complaints on-and-off since her surgery. This is unlikely to be cardiac in etiology.  Plan: #1 acute pancreatitis with transaminitis: We will keep her nothing by mouth.  Trend lipase and LFTs. We will get an ultrasound of her abdomen. She might end up  requiring ERCP. I have discussed with gastroenterology and they will consult. Pain control as needed. Antiemetics as needed.  #2 Questionable chest pain: Pain most likely secondary to the above. Troponin is normal. Obtain 2 more levels. Chest x-ray does not show any concerning findings. EKG also nonischemic.  #3 history of diabetes mellitus type 2 without complications: Sliding scale coverage will be provided.  #4 history of essential hypertension: Blood pressure was significantly elevated at presentation, probably secondary to pain. Will be rechecked. Continue to monitor closely. Hydralazine as needed.  #5 Tobacco abuse and cough: Nicotine patch will be prescribed. She does have a cough but chest x-ray is clear. Lungs are also clear to auscultation. This is likely secondary to her cigarette smoking. Continue to monitor for now.  #6 Cystic lesion in posterior right scalp: Appears to be a sebaceous cyst. Her primary care physician is monitoring.   DVT Prophylaxis: SCDs Code Status: Full code Family Communication: Discussed with the patient  Disposition Plan: Admit to telemetry. Her husband apparently is very sick. She will try to get friends to look in on him. I will consult social worker to see if further assistance can be provided.   Further management decisions will depend on results of further testing and patient's response to treatment.   [facility]  [facility] Pager [phone]  If 7PM-7AM, please contact night-coverage [email or web address] Password [facility]  03/06/2015, 3:54 PM

## 2015-03-06 NOTE — Progress Notes (Signed)
Hypoglycemic Event  CBG: 54  Treatment: D50 IV 50 mL  Symptoms: Hungry  Follow-up CBG: Time:6:31 PM CBG Result:214  Possible Reasons for Event: Other: NPO  Comments/MD notified:MD notified    [name]  Remember to initiate Hypoglycemia Order Set & complete

## 2015-03-06 NOTE — Consult Note (Signed)
Consultation  Referring Provider: Dr. [place] Pink Primary Care Physician:  Nyoka Cowden, MD Primary Gastroenterologist:  Dr.Kaplan  Reason for Consultation:   Acute pancreatitis, r/o CBD stones  HPI: [name] is a 81 y.o. female  who presented to the ER today with acute epigastric abdominal pain, onset Saturday 4/9 about 3 am, and has been constant since.  No appetite, no nausea/vomiting or fever. She apparently has been having similar but less intense episodes of pain over the past several months. Workup in ER reveals a Lipase of 127, Tbili 0.9, alk phos 199, AST 476, ALT 375. [place] is pending. Pt is s/p lap chole last year 01/2014 for gallstones- no IOC. LFTs were normal at that time and cbd 6 mm. Pt has hx of CAD, AODM,HTN.   Past Medical History  Diagnosis Date  . Diabetes mellitus, type 2   . Hyperlipidemia   . Hypertension   . History of cervical cancer   . TN (trigeminal neuralgia)   . Raynaud's phenomenon     Past Surgical History  Procedure Laterality Date  . Breast biopsy    . Breast enhancement surgery    . Total abdominal hysterectomy  1963    for cervical cancer in situ  . Cholecystectomy N/A 02/14/2014    Procedure: LAPAROSCOPIC CHOLECYSTECTOMY;  Surgeon: Ralene Ok, MD;  Location: [facility];  Service: General;  Laterality: N/A;    Prior to Admission medications   Medication Sig Start Date End Date Taking? Authorizing Provider  aspirin EC 81 MG tablet Take 81 mg by mouth at bedtime.   Yes Historical Provider, MD  atorvastatin (LIPITOR) 20 MG tablet TAKE ONE TABLET BY MOUTH ONCE DAILY IN THE MORNING Patient taking differently: Take 20 mg by mouth daily.  01/13/15  Yes Marletta Lor, MD  Blood Glucose Monitoring Suppl (ONE TOUCH ULTRA SYSTEM KIT) W/DEVICE KIT USE TO CHECK BLOOD SUGAR DAILY AND RPN 01/10/15  Yes Marletta Lor, MD  hydrochlorothiazide (HYDRODIURIL) 12.5 MG tablet Take 1 tablet (12.5 mg total) by mouth daily.  03/16/14  Yes Marletta Lor, MD  ibuprofen (ADVIL,MOTRIN) 200 MG tablet Take 200 mg by mouth every 6 (six) hours as needed for mild pain or moderate pain.    Yes Historical Provider, MD  lisinopril (PRINIVIL,ZESTRIL) 20 MG tablet Take 1 tablet (20 mg total) by mouth 2 (two) times daily. 10/25/14  Yes Marletta Lor, MD  metFORMIN (GLUCOPHAGE) 500 MG tablet TAKE ONE TABLET BY MOUTH TWICE DAILY WITH FOOD 10/25/14  Yes Marletta Lor, MD  Multiple Vitamins-Minerals (WOMENS 50+ [place] VITAMIN/MIN PO) Take 1 tablet by mouth every morning.    Yes Historical Provider, MD  ONE TOUCH ULTRA TEST test strip USE TO TEST BLOOD SUGAR DAILY AS DIRECTED 10/25/14  Yes Marletta Lor, MD  tetrahydrozoline-zinc (VISINE-AC) 0.05-0.25 % ophthalmic solution Place 1 drop into both eyes daily as needed (irritation).    Yes Historical Provider, MD  lisinopril (PRINIVIL,ZESTRIL) 20 MG tablet TAKE 1 TABLET BY MOUTH TWICE DAILY 01/12/15   Marletta Lor, MD  metFORMIN (GLUCOPHAGE) 500 MG tablet TAKE 1 TABLET BY MOUTH TWICE DAILY WITH A MEAL 01/12/15   Marletta Lor, MD    Current Facility-Administered Medications  Medication Dose Route Frequency Provider Last Rate Last Dose  . 0.9 %  sodium chloride infusion   Intravenous STAT Elnora Morrison, MD 100 mL/hr at 03/06/15 1538    . ondansetron (ZOFRAN) injection 4 mg  4 mg Intravenous Q8H PRN Elnora Morrison,  MD   4 mg at 03/06/15 1535   Current Outpatient Prescriptions  Medication Sig Dispense Refill  . aspirin EC 81 MG tablet Take 81 mg by mouth at bedtime.    [name] atorvastatin (LIPITOR) 20 MG tablet TAKE ONE TABLET BY MOUTH ONCE DAILY IN THE MORNING (Patient taking differently: Take 20 mg by mouth daily. ) 90 tablet 1  . Blood Glucose Monitoring Suppl (ONE TOUCH ULTRA SYSTEM KIT) W/DEVICE KIT USE TO CHECK BLOOD SUGAR DAILY AND RPN 1 each 0  . hydrochlorothiazide (HYDRODIURIL) 12.5 MG tablet Take 1 tablet (12.5 mg total) by mouth daily. 90 tablet 3  .  ibuprofen (ADVIL,MOTRIN) 200 MG tablet Take 200 mg by mouth every 6 (six) hours as needed for mild pain or moderate pain.     [name] lisinopril (PRINIVIL,ZESTRIL) 20 MG tablet Take 1 tablet (20 mg total) by mouth 2 (two) times daily. 90 tablet 1  . metFORMIN (GLUCOPHAGE) 500 MG tablet TAKE ONE TABLET BY MOUTH TWICE DAILY WITH FOOD 180 tablet 1  . Multiple Vitamins-Minerals (WOMENS 50+ [place] VITAMIN/MIN PO) Take 1 tablet by mouth every morning.     . ONE TOUCH ULTRA TEST test strip USE TO TEST BLOOD SUGAR DAILY AS DIRECTED 100 each 12  . tetrahydrozoline-zinc (VISINE-AC) 0.05-0.25 % ophthalmic solution Place 1 drop into both eyes daily as needed (irritation).     [name] lisinopril (PRINIVIL,ZESTRIL) 20 MG tablet TAKE 1 TABLET BY MOUTH TWICE DAILY 180 tablet 1  . metFORMIN (GLUCOPHAGE) 500 MG tablet TAKE 1 TABLET BY MOUTH TWICE DAILY WITH A MEAL 180 tablet 1    Allergies as of 03/06/2015 - Review Complete 03/06/2015  Allergen Reaction Noted  . Morphine and related Itching 08/02/2014    History reviewed. No pertinent family history.  History   Social History  . Marital Status: Married    Spouse Name: [name]  . Number of Children: 1  . Years of Education: N/A   Occupational History  . Unemployed    Social History Main Topics  . Smoking status: Current Every Day Smoker -- 1.50 packs/day for 60 years    Types: Cigarettes  . Smokeless tobacco: Never Used  . Alcohol Use: No  . Drug Use: No  . Sexual Activity: Not on file   Other Topics Concern  . Not on file   Social History Narrative   Married.  Lives in [place] with her husband.  No FH of heart disease.    Review of Systems: Pertinent positive and negative review of systems were noted in the above HPI section.  All other review of systems was otherwise negative.  Physical Exam: Vital signs in last 24 hours: Pulse Rate:  [71-86] 71 (04/11 1340) Resp:  [17-22] 17 (04/11 1340) BP: (181-200)/(56-57) 200/56 mmHg (04/11 1340) SpO2:  [96  %-97 %] 96 % (04/11 1340)   General:   Alert,  Well-developed, well-nourished small elderly female , pleasant and cooperative in NAD Head:  Normocephalic and atraumatic. Eyes:  Sclera clear, no icterus.   Conjunctiva pink. Ears:  Normal auditory acuity. Nose:  No deformity, discharge,  or lesions. Mouth:  No deformity or lesions.   Neck:  Supple; no masses or thyromegaly. Lungs:  Clear throughout to auscultation.  scattered wheeze eart:  Regular rate and rhythm; no murmurs, clicks, rubs,  or gallops. Abdomen:  Soft, mildly tender epigastrium, BS active, nonpalp mass or hsm.   Rectal:  Deferred  Msk:  Symmetrical without gross deformities. . Pulses:  Normal pulses noted. Extremities:  Without clubbing or edema. Neurologic:  Alert and  oriented x4;  grossly normal neurologically. Skin:  Intact without significant lesions or rashes.. Psych:  Alert and cooperative. Normal mood and affect.  Intake/Output from previous day:   Intake/Output this shift: Total I/O In: 1500 [I.V.:1500] Out: -   Lab Results:  Recent Labs  03/06/15 1054  WBC 7.0  HGB 15.0  HCT 45.5  PLT 246   BMET  Recent Labs  03/06/15 1054  NA 139  K 3.8  CL 103  CO2 27  GLUCOSE 101*  BUN 13  CREATININE 0.95  CALCIUM 9.5   LFT  Recent Labs  03/06/15 1334  PROT 7.1  ALBUMIN 4.0  AST 476*  ALT 375*  ALKPHOS 199*  BILITOT 0.9  BILIDIR 0.3  IBILI 0.6     Studies/Results: [place] - pending   IMPRESSION:  #1 81 yo female with 2 day hx of persistent epigastric pain and anorexia, and labs consistent with mild acute pancreatitis. Also has mild transaminitis concerning for biliary pancreatitis secondary to CBD stones #2 s/p lap chole one year ago for stones, no IOC-normal LFTs #3 Smoker #4 HTN #5 AODM #6 trigeminal neuralgia   PLAN:  #1 Keep npo tonight #2 liberal fluids #3 repeat labs in am #4 await US findings, if dilated duct may need ERCP . Will follow along with you.   Amy Esterwood   03/06/2015, 4:07 PM     Attending physician's note   I have taken a history, examined the patient and reviewed the chart. I agree with the Advanced Practitioner's note, impression and recommendations. Recurrent episodes of upper abdominal pain now in ED with elevated LFTs. Abd pain is resolving. S/P lap chole without IOC in 01/2014-LFTs were normal at the time. Strongly suspect choledocholithiasis. Possibly has mild pancreatitis. Await abd [place]. Trend LFTs and lipase. May need ERCP.   Ladene Artist, MD [facility]

## 2015-03-06 NOTE — Progress Notes (Signed)
UR completed 

## 2015-03-07 DIAGNOSIS — R7989 Other specified abnormal findings of blood chemistry: Secondary | ICD-10-CM

## 2015-03-07 DIAGNOSIS — R079 Chest pain, unspecified: Secondary | ICD-10-CM

## 2015-03-07 DIAGNOSIS — K851 Biliary acute pancreatitis: Secondary | ICD-10-CM

## 2015-03-07 LAB — COMPREHENSIVE METABOLIC PANEL
ALK PHOS: 143 U/L — AB (ref 39–117)
ALT: 210 U/L — ABNORMAL HIGH (ref 0–35)
AST: 132 U/L — AB (ref 0–37)
Albumin: 3.3 g/dL — ABNORMAL LOW (ref 3.5–5.2)
Anion gap: 6 (ref 5–15)
BUN: 10 mg/dL (ref 6–23)
CALCIUM: 8.5 mg/dL (ref 8.4–10.5)
CO2: 24 mmol/L (ref 19–32)
Chloride: 111 mmol/L (ref 96–112)
Creatinine, Ser: 0.78 mg/dL (ref 0.50–1.10)
GFR calc non Af Amer: 77 mL/min — ABNORMAL LOW (ref >90)
GFR, EST AFRICAN AMERICAN: 90 mL/min — AB (ref >90)
Glucose, Bld: 109 mg/dL — ABNORMAL HIGH (ref 70–99)
Potassium: 3.5 mmol/L (ref 3.5–5.1)
SODIUM: 141 mmol/L (ref 135–145)
TOTAL PROTEIN: 5.9 g/dL — AB (ref 6.0–8.3)
Total Bilirubin: 0.4 mg/dL (ref 0.3–1.2)

## 2015-03-07 LAB — CBC
HCT: 42.2 % (ref 36.0–46.0)
Hemoglobin: 13.9 g/dL (ref 12.0–15.0)
MCH: 31.2 pg (ref 26.0–34.0)
MCHC: 32.9 g/dL (ref 30.0–36.0)
MCV: 94.6 fL (ref 78.0–100.0)
Platelets: 249 10*3/uL (ref 150–400)
RBC: 4.46 MIL/uL (ref 3.87–5.11)
RDW: 14.3 % (ref 11.5–15.5)
WBC: 7 10*3/uL (ref 4.0–10.5)

## 2015-03-07 LAB — GLUCOSE, CAPILLARY
GLUCOSE-CAPILLARY: 121 mg/dL — AB (ref 70–99)
GLUCOSE-CAPILLARY: 126 mg/dL — AB (ref 70–99)
GLUCOSE-CAPILLARY: 202 mg/dL — AB (ref 70–99)
Glucose-Capillary: 110 mg/dL — ABNORMAL HIGH (ref 70–99)
Glucose-Capillary: 124 mg/dL — ABNORMAL HIGH (ref 70–99)
Glucose-Capillary: 91 mg/dL (ref 70–99)
Glucose-Capillary: 131 mg/dL — ABNORMAL HIGH (ref 70–99)

## 2015-03-07 LAB — TROPONIN I
Troponin I: <0.03 ng/mL (ref <0.031)
Troponin I: <0.03 ng/mL (ref <0.031)

## 2015-03-07 LAB — LIPASE, BLOOD: LIPASE: 75 U/L — AB (ref 11–59)

## 2015-03-07 NOTE — Progress Notes (Signed)
[facility] PROGRESS NOTE  Brynnlee Cumpian EXN:[id number] DOB: 13-Dec-1933 DOA: 03/06/2015  PCP: Nyoka Cowden, MD  Brief HPI: 81 year old female with past medical history of diabetes, hypertension, hypercholesterolemia, presented with upper abdominal pain. She had elevated lipase and transaminases. There was a concern for choledocholithiasis. She is status post cholecystectomy. She was admitted for further management. Gastroenterology was consulted. Plan is for ERCP on 4/13.  Past medical history:  Past Medical History  Diagnosis Date  . Diabetes mellitus, type 2   . Hyperlipidemia   . Hypertension   . History of cervical cancer   . TN (trigeminal neuralgia)   . Raynaud's phenomenon     Consultants: Gastroenterology  Procedures: ERCP scheduled for 4/13  Antibiotics: None  Subjective: Patient is hungry this morning. Denies any abdominal pain, nausea or vomiting. No other complaints offered.  Objective: Vital Signs  Filed Vitals:   03/06/15 1749 03/06/15 1815 03/06/15 2159 03/07/15 0502  BP:  176/69 154/55 161/50  Pulse:   83 74  Temp:   99 F (37.2 C) 98.5 F (36.9 C)  TempSrc:   Oral Oral  Resp:   18 18  Height: 5' (1.524 m)     Weight: 44.634 kg (98 lb 6.4 oz)     SpO2:   92% 92%    Intake/Output Summary (Last 24 hours) at 03/07/15 1050 Last data filed at 03/07/15 4967  Gross per 24 hour  Intake   2470 ml  Output      0 ml  Net   2470 ml   Filed Weights   03/06/15 1749  Weight: 44.634 kg (98 lb 6.4 oz)    General appearance: alert, cooperative, appears stated age and no distress Resp: clear to auscultation bilaterally Cardio: regular rate and rhythm, S1, S2 normal, no murmur, click, rub or gallop GI: Minimal discomfort in the epigastric area without any rebound, rigidity or guarding. No masses or organomegaly. Bowel sounds are present. Neurologic: Alert and oriented 3. No focal neurological deficits are noted.  Lab  Results:  Basic Metabolic Panel:  Recent Labs Lab 03/06/15 1054 03/07/15 0524  NA 139 141  K 3.8 3.5  CL 103 111  CO2 27 24  GLUCOSE 101* 109*  BUN 13 10  CREATININE 0.95 0.78  CALCIUM 9.5 8.5   Liver Function Tests:  Recent Labs Lab 03/06/15 1334 03/07/15 0524  AST 476* 132*  ALT 375* 210*  ALKPHOS 199* 143*  BILITOT 0.9 0.4  PROT 7.1 5.9*  ALBUMIN 4.0 3.3*    Recent Labs Lab 03/06/15 1334 03/07/15 0524  LIPASE 127* 75*   CBC:  Recent Labs Lab 03/06/15 1054 03/07/15 0524  WBC 7.0 7.0  HGB 15.0 13.9  HCT 45.5 42.2  MCV 95.2 94.6  PLT 246 249   Cardiac Enzymes:  Recent Labs Lab 03/06/15 1855 03/07/15 03/07/15 0524  TROPONINI <0.03 <0.03 <0.03   CBG:  Recent Labs Lab 03/06/15 1830 03/06/15 2000 03/07/15 0022 03/07/15 0455 03/07/15 0733  GLUCAP 214* 173* 121* 110* 124*    Studies/Results: Dg Chest 2 View  03/06/2015   CLINICAL DATA:  Central chest pain for 3 days  EXAM: CHEST  2 VIEW  COMPARISON:  08/02/2014  FINDINGS: Cardiac shadow is stable. The lungs are well aerated bilaterally. No focal infiltrate or sizable effusion is seen. No acute bony abnormality is noted. Diffuse breast calcifications are seen.  IMPRESSION: No acute abnormality noted.   Electronically Signed   By: Inez Catalina M.D.   On: 03/06/2015  13:03   US Abdomen Complete  03/06/2015   CLINICAL DATA:  Elevated transaminases.  Mid abdominal pain.  EXAM: ULTRASOUND ABDOMEN COMPLETE  COMPARISON:  10/08/2013 ; 10/07/2013  FINDINGS: Gallbladder: Surgically absent  Common bile duct: Diameter: 11 mm  Liver: Intra and extrahepatic biliary dilatation. No focal lesion identified.  IVC: No abnormality visualized.  Pancreas: Stable mild dilatation of the dorsal pancreatic duct at 3 mm.  Spleen: Size and appearance within normal limits.  Right Kidney: Length: 9.5 cm. Echogenicity within normal limits. No mass or hydronephrosis visualized.  Left Kidney: Length: 9.5 cm. Echogenicity within  normal limits. No mass or hydronephrosis visualized.  Abdominal aorta: No aneurysm visualized.  Atherosclerotic plaque.  Other findings: None.  IMPRESSION: 1. Intrahepatic and extrahepatic biliary dilatation without stones identified. 2. Stable mild dilatation of the dorsal pancreatic duct, and 3 mm diameter. 3. Atherosclerotic abdominal aorta.   Electronically Signed   By: Van Clines M.D.   On: 03/06/2015 17:30    Medications:  Scheduled: . insulin aspart  0-9 Units Subcutaneous 6 times per day  . nicotine  14 mg Transdermal Daily  . sodium chloride  3 mL Intravenous Q12H   Continuous: . dextrose 5 % and 0.45% NaCl 75 mL/hr at 03/07/15 0704  . dextrose 5 % and 0.45% NaCl     YIF:OYDXAJOINOMVE **OR** acetaminophen, albuterol, hydrALAZINE, HYDROmorphone (DILAUDID) injection, ondansetron **OR** ondansetron (ZOFRAN) IV  Assessment/Plan:  Principal Problem:   Acute pancreatitis Active Problems:   Diabetes mellitus without complication   Essential hypertension   Tobacco abuse   Transaminitis   Epigastric abdominal pain    Acute pancreatitis with transaminitis LFT and lipase level, improving. Biliary ductal dilatation noted on ultrasound. There is a concern for ductal stones. GI has seen the patient. Plan is for ERCP tomorrow morning. Pain is well controlled now. Continue current management.  Questionable chest pain Pain most likely secondary to the above. Troponin levels are normal. Chest x-ray does not show any concerning findings. EKG also nonischemic. No further cardiac workup is needed.  History of diabetes mellitus type 2 without complications Sliding scale coverage will be continued.  History of essential hypertension Blood pressure was significantly elevated at presentation, probably secondary to pain. Now better. Hydralazine as needed.   Tobacco abuse and cough Continue nicotine patch. She does have a cough but chest x-ray is clear. Lungs are also clear to  auscultation. This is likely secondary to her cigarette smoking. Continue to monitor for now.  Cystic lesion in posterior right scalp Appears to be a sebaceous cyst. Her primary care physician is monitoring.  DVT Prophylaxis: SCDs    Code Status: Full code  Family Communication: Discussed with the patient  Disposition Plan: Not ready for discharge. ERCP tomorrow morning.    LOS: 1 day   [facility] Pager [phone] 03/07/2015, 10:50 AM  If 7PM-7AM, please contact night-coverage at [email or web address], password [facility]

## 2015-03-07 NOTE — Progress Notes (Signed)
Clinical Social Work Department BRIEF PSYCHOSOCIAL ASSESSMENT 03/07/2015  Patient:  [name],[name]     Account Number:  [id number]     Admit date:  03/06/2015  Clinical Social Worker:  Glorious Peach, CLINICAL SOCIAL WORKER  Date/Time:  03/07/2015 11:12 AM  Referred by:  Physician  Date Referred:  03/07/2015 Referred for  Other - See comment   Other Referral:   Sick husband at home- need resources for helping him   Interview type:  Patient Other interview type:    PSYCHOSOCIAL DATA Living Status:  HUSBAND Admitted from facility:   Level of care:   Primary support name:  Durwin Reges Primary support relationship to patient:  SPOUSE Degree of support available:   Adequate    CURRENT CONCERNS Current Concerns  Other - See comment   Other Concerns:   Helping sick husband at home    [place] / PLAN CSW received consult- pt needs assistance with sick husband at home.   Assessment/plan status:  Information/Referral to [employer] Other assessment/ plan:   Information/referral to community resources:   CSW to meet with pt to see what we can assist with and resources that we can provide.    PATIENT'S/FAMILY'S RESPONSE TO PLAN OF CARE: CSW met with pt, introduced self and explained role. CSW inquired about pt's living situation at home. Pt states that she lives at home with her husband where they have a beautiful pond, temple, and garden that she takes care of. Pt states that she is getting old and needs help with her sick husband that she has to bathe and feed because he is too weak to do so on his own. Pt states that PT and nurses come in twice a week to help but sometimes her husband "[name]" is resistance to those services because he feels that they can do it on their own. Pt is mainly concerned about getting in and out of the tub, she stated that she purchased a rail from [employer], but it does not fit and was hoping that someone can come in and  install a better one. CSW spoke with RN Case Manager Juliann Pulse, who states that [facility] will not come out and install bathroom equipment but informed pt that she can go to a [employer] to get her tub properly measured and hire someone to come in and install one. Pt states that she will do that as soon as she is DC, and believes that would help her situation. CSW asked if we could assist with any other services at this time like caregiver resources, etc., pt states that she would talk to [name] about that before making decisions. CSW encouraged pt to do so. CSW is signing off but if needed can be reached at [phone].       Glorious Peach BSW Intern

## 2015-03-07 NOTE — Progress Notes (Addendum)
Patient ID: [name], female   DOB: 1934/07/15, 81 y.o.   MRN: [id number]    Progress Note   Subjective  Feels fine, hungry, denies any pain   Objective   Vital signs in last 24 hours: Temp:  [98.1 F (36.7 C)-99 F (37.2 C)] 98.5 F (36.9 C) (04/12 0502) Pulse Rate:  [71-86] 74 (04/12 0502) Resp:  [16-22] 18 (04/12 0502) BP: (154-200)/(50-69) 161/50 mmHg (04/12 0502) SpO2:  [92 %-97 %] 92 % (04/12 0502) Weight:  [98 lb 6.4 oz (44.634 kg)] 98 lb 6.4 oz (44.634 kg) (04/11 1749) Last BM Date: 03/05/15 General: older   Asian  female in NAD Heart:  Regular rate and rhythm; no murmurs Lungs: Respirations even and unlabored, lungs CTA bilaterally Abdomen:  Soft, nontender and nondistended. Normal bowel sounds. Extremities:  Without edema. Neurologic:  Alert and oriented,  grossly normal neurologically. Psych:  Cooperative. Normal mood and affect.  Intake/Output from previous day: 04/11 0701 - 04/12 0700 In: 2470 [I.V.:2470] Out: -  Intake/Output this shift:    Lab Results: Lipase 75  Recent Labs  03/06/15 1054 03/07/15 0524  WBC 7.0 7.0  HGB 15.0 13.9  HCT 45.5 42.2  PLT 246 249   BMET  Recent Labs  03/06/15 1054 03/07/15 0524  NA 139 141  K 3.8 3.5  CL 103 111  CO2 27 24  GLUCOSE 101* 109*  BUN 13 10  CREATININE 0.95 0.78  CALCIUM 9.5 8.5   LFT  Recent Labs  03/06/15 1334 03/07/15 0524  PROT 7.1 5.9*  ALBUMIN 4.0 3.3*  AST 476* 132*  ALT 375* 210*  ALKPHOS 199* 143*  BILITOT 0.9 0.4  BILIDIR 0.3  --   IBILI 0.6  --    PT/INR No results for input(s): LABPROT, INR in the last 72 hours.  Studies/Results: Dg Chest 2 View  03/06/2015   CLINICAL DATA:  Central chest pain for 3 days  EXAM: CHEST  2 VIEW  COMPARISON:  08/02/2014  FINDINGS: Cardiac shadow is stable. The lungs are well aerated bilaterally. No focal infiltrate or sizable effusion is seen. No acute bony abnormality is noted. Diffuse breast calcifications are seen.   IMPRESSION: No acute abnormality noted.   Electronically Signed   By: Inez Catalina M.D.   On: 03/06/2015 13:03   US Abdomen Complete  03/06/2015   CLINICAL DATA:  Elevated transaminases.  Mid abdominal pain.  EXAM: ULTRASOUND ABDOMEN COMPLETE  COMPARISON:  10/08/2013 ; 10/07/2013  FINDINGS: Gallbladder: Surgically absent  Common bile duct: Diameter: 11 mm  Liver: Intra and extrahepatic biliary dilatation. No focal lesion identified.  IVC: No abnormality visualized.  Pancreas: Stable mild dilatation of the dorsal pancreatic duct at 3 mm.  Spleen: Size and appearance within normal limits.  Right Kidney: Length: 9.5 cm. Echogenicity within normal limits. No mass or hydronephrosis visualized.  Left Kidney: Length: 9.5 cm. Echogenicity within normal limits. No mass or hydronephrosis visualized.  Abdominal aorta: No aneurysm visualized.  Atherosclerotic plaque.  Other findings: None.  IMPRESSION: 1. Intrahepatic and extrahepatic biliary dilatation without stones identified. 2. Stable mild dilatation of the dorsal pancreatic duct, and 3 mm diameter. 3. Atherosclerotic abdominal aorta.   Electronically Signed   By: Van Clines M.D.   On: 03/06/2015 17:30       Assessment / Plan:   #1  81 yo female with acute mild pancreatitis likely secondary to cbd stones Pain has resolved Unable to schedule ERCP for today- will allow clear liquids today and  have rescheduled ERCP for tomorrow at 12:30  Principal Problem:   Acute pancreatitis Active Problems:   Diabetes mellitus without complication   Essential hypertension   Tobacco abuse   Transaminitis   Epigastric abdominal pain     LOS: 1 day   Amy Esterwood  03/07/2015, 9:00 AM     Attending physician's note   I have taken an interval history, reviewed the chart and examined the patient. I agree with the Advanced Practitioner's note, impression and recommendations. Pain has resolved. LFTs and lipase improving. US shows diffuse biliary dilation and  mild stable PD dilation. Resolving mild pancreatitis and suspected choledocholithiasis. ERCP scheduled for tomorrow. The risks (including pancreatitis, bleeding, perforation, infection, missed lesions, medication reactions and possible hospitalization or surgery if complications occur), benefits, and alternatives to ERCP with possible sphincterotomy, stone extraction, biopsy, stent placement were discussed with the patient and they consent to proceed.    Pricilla Riffle. Fuller Plan, MD [facility]

## 2015-03-07 NOTE — Progress Notes (Signed)
CARE MANAGEMENT NOTE 03/07/2015  Patient:  [name],[name]   Account Number:  [id number]  Date Initiated:  03/07/2015  Documentation initiated by:  Dessa Phi  Subjective/Objective Assessment:   81 y/o f admitted w/Acute pancreatitis.     Action/Plan:   From home.   Anticipated DC Date:  03/10/2015   Anticipated DC Plan:  [place]  CM consult      Choice offered to / List presented to:             Status of service:  In process, will continue to follow Medicare Important Message given?   (If response is "NO", the following Medicare IM given date fields will be blank) Date Medicare IM given:   Medicare IM given by:   Date Additional Medicare IM given:   Additional Medicare IM given by:    Discharge Disposition:    Per UR Regulation:  Reviewed for med. necessity/level of care/duration of stay  If discussed at [place] of Stay Meetings, dates discussed:    Comments:  03/07/15 Dessa Phi RN BSN NCM [phone] No anticipated d/c needs.

## 2015-03-08 ENCOUNTER — Telehealth: Payer: Self-pay

## 2015-03-08 ENCOUNTER — Inpatient Hospital Stay (HOSPITAL_COMMUNITY): Payer: Medicare Other | Admitting: Registered Nurse

## 2015-03-08 ENCOUNTER — Inpatient Hospital Stay (HOSPITAL_COMMUNITY): Payer: Medicare Other

## 2015-03-08 ENCOUNTER — Encounter (HOSPITAL_COMMUNITY)
Admission: EM | Disposition: A | Discharge status: Home / Self Care | Payer: Self-pay | Source: Home / Self Care | Attending: Internal Medicine

## 2015-03-08 ENCOUNTER — Encounter (HOSPITAL_COMMUNITY): Payer: Self-pay | Admitting: *Deleted

## 2015-03-08 DIAGNOSIS — R935 Abnormal findings on diagnostic imaging of other abdominal regions, including retroperitoneum: Secondary | ICD-10-CM

## 2015-03-08 DIAGNOSIS — K838 Other specified diseases of biliary tract: Secondary | ICD-10-CM

## 2015-03-08 HISTORY — PX: ERCP: SHX5425

## 2015-03-08 LAB — COMPREHENSIVE METABOLIC PANEL
ALBUMIN: 3.1 g/dL — AB (ref 3.5–5.2)
ALT: 132 U/L — ABNORMAL HIGH (ref 0–35)
AST: 49 U/L — ABNORMAL HIGH (ref 0–37)
Alkaline Phosphatase: 125 U/L — ABNORMAL HIGH (ref 39–117)
Anion gap: 6 (ref 5–15)
BUN: 7 mg/dL (ref 6–23)
CO2: 25 mmol/L (ref 19–32)
CREATININE: 0.75 mg/dL (ref 0.50–1.10)
Calcium: 8.6 mg/dL (ref 8.4–10.5)
Chloride: 112 mmol/L (ref 96–112)
GFR calc non Af Amer: 78 mL/min — ABNORMAL LOW (ref >90)
GLUCOSE: 162 mg/dL — AB (ref 70–99)
Potassium: 3.6 mmol/L (ref 3.5–5.1)
Sodium: 143 mmol/L (ref 135–145)
Total Bilirubin: 0.6 mg/dL (ref 0.3–1.2)
Total Protein: 5.9 g/dL — ABNORMAL LOW (ref 6.0–8.3)

## 2015-03-08 LAB — GLUCOSE, CAPILLARY
GLUCOSE-CAPILLARY: 112 mg/dL — AB (ref 70–99)
GLUCOSE-CAPILLARY: 162 mg/dL — AB (ref 70–99)
GLUCOSE-CAPILLARY: 147 mg/dL — AB (ref 70–99)
GLUCOSE-CAPILLARY: 189 mg/dL — AB (ref 70–99)
Glucose-Capillary: 167 mg/dL — ABNORMAL HIGH (ref 70–99)
Glucose-Capillary: 107 mg/dL — ABNORMAL HIGH (ref 70–99)

## 2015-03-08 LAB — HEMOGLOBIN A1C
Hgb A1c MFr Bld: 6.4 % — ABNORMAL HIGH (ref 4.8–5.6)
Mean Plasma Glucose: 137 mg/dL

## 2015-03-08 LAB — LIPASE, BLOOD: LIPASE: 74 U/L — AB (ref 11–59)

## 2015-03-08 SURGERY — ERCP, WITH INTERVENTION IF INDICATED
Anesthesia: General

## 2015-03-08 MED ORDER — ONDANSETRON HCL 4 MG/2ML IJ SOLN
INTRAMUSCULAR | Status: DC | PRN
  Administered 2015-03-08: 4 mg via INTRAVENOUS

## 2015-03-08 MED ORDER — PHENYLEPHRINE HCL 10 MG/ML IJ SOLN
INTRAMUSCULAR | Status: DC | PRN
  Administered 2015-03-08: 40 ug via INTRAVENOUS

## 2015-03-08 MED ORDER — AMPICILLIN-SULBACTAM SODIUM 3 (2-1) G IJ SOLR
3.0000 g | Freq: Once | INTRAMUSCULAR | Status: DC
  Filled 2015-03-08: qty 3

## 2015-03-08 MED ORDER — FENTANYL CITRATE 0.05 MG/ML IJ SOLN
INTRAMUSCULAR | Status: DC | PRN
  Administered 2015-03-08 (×3): 25 ug via INTRAVENOUS

## 2015-03-08 MED ORDER — PHENYLEPHRINE 40 MCG/ML (10ML) SYRINGE FOR IV PUSH (FOR BLOOD PRESSURE SUPPORT)
PREFILLED_SYRINGE | INTRAVENOUS | Status: AC
  Filled 2015-03-08: qty 10

## 2015-03-08 MED ORDER — SODIUM CHLORIDE 0.9 % IV SOLN
1.5000 g | Freq: Once | INTRAVENOUS | Status: AC
  Administered 2015-03-08: 1.5 g via INTRAVENOUS
  Filled 2015-03-08: qty 1.5

## 2015-03-08 MED ORDER — GLUCAGON HCL RDNA (DIAGNOSTIC) 1 MG IJ SOLR
INTRAMUSCULAR | Status: DC | PRN
  Administered 2015-03-08 (×3): 0.25 mg via INTRAVENOUS

## 2015-03-08 MED ORDER — PROPOFOL 10 MG/ML IV BOLUS
INTRAVENOUS | Status: AC
  Filled 2015-03-08: qty 20

## 2015-03-08 MED ORDER — SODIUM CHLORIDE 0.9 % IV SOLN
INTRAVENOUS | Status: DC | PRN
  Administered 2015-03-08: 10 mL

## 2015-03-08 MED ORDER — ONDANSETRON HCL 4 MG/2ML IJ SOLN
INTRAMUSCULAR | Status: AC
  Filled 2015-03-08: qty 2

## 2015-03-08 MED ORDER — SUCCINYLCHOLINE CHLORIDE 20 MG/ML IJ SOLN
INTRAMUSCULAR | Status: DC | PRN
  Administered 2015-03-08: 80 mg via INTRAVENOUS

## 2015-03-08 MED ORDER — EPHEDRINE SULFATE 50 MG/ML IJ SOLN
INTRAMUSCULAR | Status: DC | PRN
  Administered 2015-03-08: 5 mg via INTRAVENOUS

## 2015-03-08 MED ORDER — PROPOFOL 10 MG/ML IV BOLUS
INTRAVENOUS | Status: DC | PRN
  Administered 2015-03-08: 100 mg via INTRAVENOUS

## 2015-03-08 MED ORDER — LIDOCAINE HCL (CARDIAC) 20 MG/ML IV SOLN
INTRAVENOUS | Status: AC
  Filled 2015-03-08: qty 5

## 2015-03-08 MED ORDER — LIDOCAINE HCL (CARDIAC) 20 MG/ML IV SOLN
INTRAVENOUS | Status: DC | PRN
  Administered 2015-03-08: 70 mg via INTRAVENOUS

## 2015-03-08 MED ORDER — FENTANYL CITRATE 0.05 MG/ML IJ SOLN
INTRAMUSCULAR | Status: AC
  Filled 2015-03-08: qty 2

## 2015-03-08 MED ORDER — LACTATED RINGERS IV SOLN
INTRAVENOUS | Status: DC
  Administered 2015-03-08: 13:00:00 via INTRAVENOUS

## 2015-03-08 NOTE — Op Note (Signed)
[facility] [name], [id number]   ERCP PROCEDURE REPORT        EXAM DATE: 03/08/2015  PATIENT NAME:          [name], [name]          MR #: [id number] BIRTHDATE:       1934/10/07     VISIT #:     [phone] ATTENDING:     Ladene Artist, MD, [facility]     STATUS:     outpatient  ASSISTANT:      Michelle Piper and Violeta Gelinas   INDICATIONS:  The patient is a 81 yr old female here for an ERCP due to abnormal abdominal ultrasound, biliary dilation on ultrasound, abnormal liver function test , and abdominal pain of suspected biliary origin. PROCEDURE PERFORMED:     ERCP, diagnostic, incomplete MEDICATIONS:     Monitored anesthesia care and Per Anesthesia   CONSENT: The patient understands the risks and benefits of the procedure and understands that these risks include, but are not limited to: sedation, allergic reaction, infection, perforation and/or bleeding. Alternative means of evaluation and treatment include, among others: physical exam, x-rays, and/or surgical intervention. The patient elects to proceed with this endoscopic procedure.  DESCRIPTION OF PROCEDURE: During intra-op preparation period all mechanical & medical equipment was checked for proper function. Hand hygiene and appropriate measures for infection prevention was taken. After the risks, benefits and alternatives of the procedure were thoroughly explained, Informed was verified, confirmed and timeout was successfully executed by the treatment team. With the patient in left semi-prone position, medications were administered intravenously.The Pentax ERCP [id number] was passed from the mouth into the esophagus and further advanced from the esophagus into the stomach. From stomach scope was directed to the second portion of the duodenum.  Major papilla was aligned with the duodenoscope. The scope position was confirmed fluoroscopically. Rest of  the findings/therapeutics are given below. The scope was then completely withdrawn from the patient and the procedure completed. The pulse, BP, and O2 saturation were monitored and documented by the physician and the nursing staff throughout the entire procedure. The patient was cared for as planned according to standard protocol. The patient was then discharged to recovery in stable condition and with appropriate post procedure care.  The ampulla was located in the second portion of the duodenum.  The ampulla appeared prominent and it was floppy. Ampullary orfice cannulated 2-3 mm but free cannulation could not be accomplished with 2 sizes of sphincterotomes and 2 sizes of guidewires despite attempts for 45 minutes. Small amounts of bile noted draining from orifice. Attempt made to inject contrast with the limited cannulation however unable fill PD or CBD. Unable to cannulate PD or CBD and procedure was terminated.    ADVERSE EVENT:     There were no complications.  IMPRESSIONS:     Normal appearing, prominent ampulla Unable to cannulate CBD or PD  RECOMMENDATIONS:     Discuss referral to tertiary center for another attempt at ERCP    Ladene Artist, MD, [facility] eSigned:  Ladene Artist, MD, [facility] 03/08/2015 2:14 PM   cc: [facility]  CPT CODES: ICD9 CODES:     PATIENT NAME:  [name] MR#: [id number]

## 2015-03-08 NOTE — Interval H&P Note (Signed)
History and Physical Interval Note:  03/08/2015 11:41 AM  [name]  has presented today for surgery, with the diagnosis of CBD stone  The various methods of treatment have been discussed with the patient and family. After consideration of risks, benefits and other options for treatment, the patient has consented to  Procedure(s): ENDOSCOPIC RETROGRADE CHOLANGIOPANCREATOGRAPHY (ERCP) (N/A) as a surgical intervention .  The patient's history has been reviewed, patient examined, no change in status, stable for surgery.  I have reviewed the patient's chart and labs.  Questions were answered to the patient's satisfaction.     Pricilla Riffle. Fuller Plan MD

## 2015-03-08 NOTE — H&P (View-Only) (Signed)
  Consultation  Referring Provider: Dr. Krishnan Primary Care Physician:  KWIATKOWSKI,PETER FRANK, MD Primary Gastroenterologist:  Dr.Kaplan  Reason for Consultation:   Acute pancreatitis, r/o CBD stones  HPI: [name] is a 81 y.o. female  who presented to the ER today with acute epigastric abdominal pain, onset Saturday 4/9 about 3 am, and has been constant since.  No appetite, no nausea/vomiting or fever. She apparently has been having similar but less intense episodes of pain over the past several months. Workup in ER reveals a Lipase of 127, Tbili 0.9, alk phos 199, AST 476, ALT 375. US is pending. Pt is s/p lap chole last year 01/2014 for gallstones- no IOC. LFTs were normal at that time and cbd 6 mm. Pt has hx of CAD, AODM,HTN.   Past Medical History  Diagnosis Date  . Diabetes mellitus, type 2   . Hyperlipidemia   . Hypertension   . History of cervical cancer   . TN (trigeminal neuralgia)   . Raynaud's phenomenon     Past Surgical History  Procedure Laterality Date  . Breast biopsy    . Breast enhancement surgery    . Total abdominal hysterectomy  1963    for cervical cancer in situ  . Cholecystectomy N/A 02/14/2014    Procedure: LAPAROSCOPIC CHOLECYSTECTOMY;  Surgeon: Armando Ramirez, MD;  Location: [facility] OR;  Service: General;  Laterality: N/A;    Prior to Admission medications   Medication Sig Start Date End Date Taking? Authorizing Provider  aspirin EC 81 MG tablet Take 81 mg by mouth at bedtime.   Yes Historical Provider, MD  atorvastatin (LIPITOR) 20 MG tablet TAKE ONE TABLET BY MOUTH ONCE DAILY IN THE MORNING Patient taking differently: Take 20 mg by mouth daily.  01/13/15  Yes Peter F Kwiatkowski, MD  Blood Glucose Monitoring Suppl (ONE TOUCH ULTRA SYSTEM KIT) W/DEVICE KIT USE TO CHECK BLOOD SUGAR DAILY AND RPN 01/10/15  Yes Peter F Kwiatkowski, MD  hydrochlorothiazide (HYDRODIURIL) 12.5 MG tablet Take 1 tablet (12.5 mg total) by mouth daily.  03/16/14  Yes Peter F Kwiatkowski, MD  ibuprofen (ADVIL,MOTRIN) 200 MG tablet Take 200 mg by mouth every 6 (six) hours as needed for mild pain or moderate pain.    Yes Historical Provider, MD  lisinopril (PRINIVIL,ZESTRIL) 20 MG tablet Take 1 tablet (20 mg total) by mouth 2 (two) times daily. 10/25/14  Yes Peter F Kwiatkowski, MD  metFORMIN (GLUCOPHAGE) 500 MG tablet TAKE ONE TABLET BY MOUTH TWICE DAILY WITH FOOD 10/25/14  Yes Peter F Kwiatkowski, MD  Multiple Vitamins-Minerals (WOMENS 50+ MULTI VITAMIN/MIN PO) Take 1 tablet by mouth every morning.    Yes Historical Provider, MD  ONE TOUCH ULTRA TEST test strip USE TO TEST BLOOD SUGAR DAILY AS DIRECTED 10/25/14  Yes Peter F Kwiatkowski, MD  tetrahydrozoline-zinc (VISINE-AC) 0.05-0.25 % ophthalmic solution Place 1 drop into both eyes daily as needed (irritation).    Yes Historical Provider, MD  lisinopril (PRINIVIL,ZESTRIL) 20 MG tablet TAKE 1 TABLET BY MOUTH TWICE DAILY 01/12/15   Peter F Kwiatkowski, MD  metFORMIN (GLUCOPHAGE) 500 MG tablet TAKE 1 TABLET BY MOUTH TWICE DAILY WITH A MEAL 01/12/15   Peter F Kwiatkowski, MD    Current Facility-Administered Medications  Medication Dose Route Frequency Provider Last Rate Last Dose  . 0.9 %  sodium chloride infusion   Intravenous STAT Joshua Zavitz, MD 100 mL/hr at 03/06/15 1538    . ondansetron (ZOFRAN) injection 4 mg  4 mg Intravenous Q8H PRN Joshua Zavitz,   MD   4 mg at 03/06/15 1535   Current Outpatient Prescriptions  Medication Sig Dispense Refill  . aspirin EC 81 MG tablet Take 81 mg by mouth at bedtime.    . atorvastatin (LIPITOR) 20 MG tablet TAKE ONE TABLET BY MOUTH ONCE DAILY IN THE MORNING (Patient taking differently: Take 20 mg by mouth daily. ) 90 tablet 1  . Blood Glucose Monitoring Suppl (ONE TOUCH ULTRA SYSTEM KIT) W/DEVICE KIT USE TO CHECK BLOOD SUGAR DAILY AND RPN 1 each 0  . hydrochlorothiazide (HYDRODIURIL) 12.5 MG tablet Take 1 tablet (12.5 mg total) by mouth daily. 90 tablet 3  .  ibuprofen (ADVIL,MOTRIN) 200 MG tablet Take 200 mg by mouth every 6 (six) hours as needed for mild pain or moderate pain.     . lisinopril (PRINIVIL,ZESTRIL) 20 MG tablet Take 1 tablet (20 mg total) by mouth 2 (two) times daily. 90 tablet 1  . metFORMIN (GLUCOPHAGE) 500 MG tablet TAKE ONE TABLET BY MOUTH TWICE DAILY WITH FOOD 180 tablet 1  . Multiple Vitamins-Minerals (WOMENS 50+ MULTI VITAMIN/MIN PO) Take 1 tablet by mouth every morning.     . ONE TOUCH ULTRA TEST test strip USE TO TEST BLOOD SUGAR DAILY AS DIRECTED 100 each 12  . tetrahydrozoline-zinc (VISINE-AC) 0.05-0.25 % ophthalmic solution Place 1 drop into both eyes daily as needed (irritation).     . lisinopril (PRINIVIL,ZESTRIL) 20 MG tablet TAKE 1 TABLET BY MOUTH TWICE DAILY 180 tablet 1  . metFORMIN (GLUCOPHAGE) 500 MG tablet TAKE 1 TABLET BY MOUTH TWICE DAILY WITH A MEAL 180 tablet 1    Allergies as of 03/06/2015 - Review Complete 03/06/2015  Allergen Reaction Noted  . Morphine and related Itching 08/02/2014    History reviewed. No pertinent family history.  History   Social History  . Marital Status: Married    Spouse Name: Jim  . Number of Children: 1  . Years of Education: N/A   Occupational History  . Unemployed    Social History Main Topics  . Smoking status: Current Every Day Smoker -- 1.50 packs/day for 60 years    Types: Cigarettes  . Smokeless tobacco: Never Used  . Alcohol Use: No  . Drug Use: No  . Sexual Activity: Not on file   Other Topics Concern  . Not on file   Social History Narrative   Married.  Lives in [place] with her husband.  No FH of heart disease.    Review of Systems: Pertinent positive and negative review of systems were noted in the above HPI section.  All other review of systems was otherwise negative.  Physical Exam: Vital signs in last 24 hours: Pulse Rate:  [71-86] 71 (04/11 1340) Resp:  [17-22] 17 (04/11 1340) BP: (181-200)/(56-57) 200/56 mmHg (04/11 1340) SpO2:  [96  %-97 %] 96 % (04/11 1340)   General:   Alert,  Well-developed, well-nourished small elderly female , pleasant and cooperative in NAD Head:  Normocephalic and atraumatic. Eyes:  Sclera clear, no icterus.   Conjunctiva pink. Ears:  Normal auditory acuity. Nose:  No deformity, discharge,  or lesions. Mouth:  No deformity or lesions.   Neck:  Supple; no masses or thyromegaly. Lungs:  Clear throughout to auscultation.  scattered wheeze eart:  Regular rate and rhythm; no murmurs, clicks, rubs,  or gallops. Abdomen:  Soft, mildly tender epigastrium, BS active, nonpalp mass or hsm.   Rectal:  Deferred  Msk:  Symmetrical without gross deformities. . Pulses:  Normal pulses noted. Extremities:    Without clubbing or edema. Neurologic:  Alert and  oriented x4;  grossly normal neurologically. Skin:  Intact without significant lesions or rashes.. Psych:  Alert and cooperative. Normal mood and affect.  Intake/Output from previous day:   Intake/Output this shift: Total I/O In: 1500 [I.V.:1500] Out: -   Lab Results:  Recent Labs  03/06/15 1054  WBC 7.0  HGB 15.0  HCT 45.5  PLT 246   BMET  Recent Labs  03/06/15 1054  NA 139  K 3.8  CL 103  CO2 27  GLUCOSE 101*  BUN 13  CREATININE 0.95  CALCIUM 9.5   LFT  Recent Labs  03/06/15 1334  PROT 7.1  ALBUMIN 4.0  AST 476*  ALT 375*  ALKPHOS 199*  BILITOT 0.9  BILIDIR 0.3  IBILI 0.6     Studies/Results: US - pending   IMPRESSION:  #1 81 yo female with 2 day hx of persistent epigastric pain and anorexia, and labs consistent with mild acute pancreatitis. Also has mild transaminitis concerning for biliary pancreatitis secondary to CBD stones #2 s/p lap chole one year ago for stones, no IOC-normal LFTs #3 Smoker #4 HTN #5 AODM #6 trigeminal neuralgia   PLAN:  #1 Keep npo tonight #2 liberal fluids #3 repeat labs in am #4 await US findings, if dilated duct may need ERCP . Will follow along with you.   Amy Esterwood   03/06/2015, 4:07 PM     Attending physician's note   I have taken a history, examined the patient and reviewed the chart. I agree with the Advanced Practitioner's note, impression and recommendations. Recurrent episodes of upper abdominal pain now in ED with elevated LFTs. Abd pain is resolving. S/P lap chole without IOC in 01/2014-LFTs were normal at the time. Strongly suspect choledocholithiasis. Possibly has mild pancreatitis. Await abd US. Trend LFTs and lipase. May need ERCP.   Korri Ask T Ski Polich, MD FACG  

## 2015-03-08 NOTE — Anesthesia Procedure Notes (Signed)
Procedure Name: Intubation Date/Time: 03/08/2015 1:01 PM Performed by: Carleene Cooper A Pre-anesthesia Checklist: Patient identified, Timeout performed, Emergency Drugs available, Suction available and Patient being monitored Patient Re-evaluated:Patient Re-evaluated prior to inductionOxygen Delivery Method: Circle system utilized Preoxygenation: Pre-oxygenation with 100% oxygen Intubation Type: IV induction Ventilation: Mask ventilation without difficulty Laryngoscope Size: Mac and 3 Grade View: Grade I Tube type: Oral Number of attempts: 1 Airway Equipment and Method: Stylet Placement Confirmation: ETT inserted through vocal cords under direct vision,  breath sounds checked- equal and bilateral and positive ETCO2 Secured at: 20 cm Tube secured with: Tape Dental Injury: Teeth and Oropharynx as per pre-operative assessment

## 2015-03-08 NOTE — Telephone Encounter (Signed)
Per ERCP procedure report from 03/08/15 patient needs referral to [facility] for ERCP. Patient records and referral faxed to [facility].

## 2015-03-08 NOTE — Discharge Instructions (Signed)
Endoscopic Retrograde Cholangiopancreatography (ERCP), Care After °Refer to this sheet in the next few weeks. These instructions provide you with information on caring for yourself after your procedure. Your health care provider may also give you more specific instructions. Your treatment has been planned according to current medical practices, but problems sometimes occur. Call your health care provider if you have any problems or questions after your procedure.  °WHAT TO EXPECT AFTER THE PROCEDURE  °After your procedure, it is typical to feel:  °· Soreness in your throat.   °· Sick to your stomach (nauseous).   °· Bloated. °· Dizzy.   °· Fatigued. °HOME CARE INSTRUCTIONS °· Have a friend or family member stay with you for the first 24 hours after your procedure. °· Start taking your usual medicines and eating normally as soon as you feel well enough to do so or as directed by your health care provider. °SEEK MEDICAL CARE IF: °· You have abdominal pain.   °· You develop signs of infection, such as:   °¨ Chills.   °¨ Feeling unwell.   °SEEK IMMEDIATE MEDICAL CARE IF: °· You have difficulty swallowing. °· You have worsening throat, chest, or abdominal pain. °· You vomit. °· You have bloody or very black stools. °· You have a fever. °Document Released: 09/01/2013 Document Reviewed: 09/01/2013 °ExitCare® Patient Information ©2015 [employer]. This information is not intended to replace advice given to you by your health care provider. Make sure you discuss any questions you have with your health care provider. ° °

## 2015-03-08 NOTE — Progress Notes (Addendum)
Patient ID: [name], female   DOB: 1934/07/05, 81 y.o.   MRN: [id number]  [facility] PROGRESS NOTE  [name] OHY:[id number] DOB: [birth year] DOA: 03/06/2015 PCP: Nyoka Cowden, MD   Brief narrative:    81 year old female with past medical history of diabetes, hypertension, hypercholesterolemia, presented with upper abdominal pain. She had elevated lipase and transaminases. There was a concern for choledocholithiasis. She is status post cholecystectomy. She was admitted for further management. Gastroenterology was consulted. Plan is for ERCP on 4/13.  Assessment/Plan:    Principal Problem:   Acute pancreatitis with transaminitis  - [place] notable for diffuse biliary dilation and mild stable PD dilation - LFT's trending down but pt with persistent discomfort in the upper abd quadrants  - plan for ERCP today  - continue to provide analgesia as needed  Active Problems:   Diabetes mellitus without complication - reasonable inpatient control - continue SSI for now    Essential hypertension - likely exacerbated from pain - hydralazine as needed will be provided    Tobacco abuse - cessation consultation provided, questions answered - provide nicotine patch    Pain in the chest - most likely secondary to principal problem, troponin level normal - CXR with no acute abnormalities  - EKG with no acute findings - now resolved  Cystic lesion in posterior right scalp - Appears to be a sebaceous cyst. Her primary care physician is monitoring.  Time spent 15 minutes   DVT prophylaxis - SCD's  Code Status: Full.  Family Communication:  plan of care discussed with the patient Disposition Plan: Home when stable.   IV access:  Peripheral IV  Procedures and diagnostic studies:    Dg Chest 2 View  03/06/2015   No acute abnormality noted.     US Abdomen Complete  03/06/2015   . Intrahepatic and extrahepatic biliary dilatation without stones  identified. 2. Stable mild dilatation of the dorsal pancreatic duct, and 3 mm diameter. 3. Atherosclerotic abdominal aorta.     Medical Consultants:  GI   Other Consultants:  None   IAnti-Infectives:   None  [name], MD  [facility] Pager [phone]  If 7PM-7AM, please contact night-coverage [email or web address] Password [email or web address] 03/08/2015, 10:14 AM   LOS: 2 days   HPI/Subjective: Pt reports persistent abd discomfort, upper quadrants.   Objective: Filed Vitals:   03/07/15 1332 03/07/15 2039 03/07/15 2317 03/08/15 0500  BP: 145/56 181/57 169/68 165/69  Pulse: 71 63  67  Temp: 98.6 F (37 C) 98.2 F (36.8 C)  98.2 F (36.8 C)  TempSrc: Oral Oral  Oral  Resp: 14 18  18   Height:      Weight:      SpO2: 95% 97%  96%    Intake/Output Summary (Last 24 hours) at 03/08/15 1014 Last data filed at 03/08/15 0700  Gross per 24 hour  Intake 2270.17 ml  Output      0 ml  Net 2270.17 ml    Exam:   General:  Pt is alert, follows commands appropriately, not in acute distress  Cardiovascular: Regular rate and rhythm, no rubs, no gallops  Respiratory: Clear to auscultation bilaterally, no wheezing, no crackles, no rhonchi  Abdomen: Soft, tender in upper abd quadrants, non distended, bowel sounds present, no guarding  Extremities: No edema, pulses DP and PT palpable bilaterally  Neuro: Grossly nonfocal  Data Reviewed: Basic Metabolic Panel:  Recent Labs Lab 03/06/15 1054 03/07/15 0524 03/08/15 0507  NA 139 141 143  K 3.8 3.5 3.6  CL 103 111 112  CO2 27 24 25   GLUCOSE 101* 109* 162*  BUN 13 10 7   CREATININE 0.95 0.78 0.75  CALCIUM 9.5 8.5 8.6   Liver Function Tests:  Recent Labs Lab 03/06/15 1334 03/07/15 0524 03/08/15 0507  AST 476* 132* 49*  ALT 375* 210* 132*  ALKPHOS 199* 143* 125*  BILITOT 0.9 0.4 0.6  PROT 7.1 5.9* 5.9*  ALBUMIN 4.0 3.3* 3.1*    Recent Labs Lab 03/06/15 1334 03/07/15 0524 03/08/15 0507  LIPASE 127* 75* 74*   CBC:  Recent  Labs Lab 03/06/15 1054 03/07/15 0524  WBC 7.0 7.0  HGB 15.0 13.9  HCT 45.5 42.2  MCV 95.2 94.6  PLT 246 249   Cardiac Enzymes:  Recent Labs Lab 03/06/15 1855 03/07/15 03/07/15 0524  TROPONINI <0.03 <0.03 <0.03   CBG:  Recent Labs Lab 03/07/15 1646 03/07/15 2036 03/07/15 2316 03/08/15 0345 03/08/15 0804  GLUCAP 126* 91 131* 167* 107*     Scheduled Meds: . ampicillin-sulbactam (UNASYN) IV  3 g Intravenous Once  . insulin aspart  0-9 Units Subcutaneous 6 times per day  . nicotine  14 mg Transdermal Daily  . sodium chloride  3 mL Intravenous Q12H   Continuous Infusions: . dextrose 5 % and 0.45% NaCl 125 mL/hr at 03/08/15 0407

## 2015-03-08 NOTE — Anesthesia Postprocedure Evaluation (Signed)
  Anesthesia Post-op Note  Patient: [name]  Procedure(s) Performed: Procedure(s) (LRB): ENDOSCOPIC RETROGRADE CHOLANGIOPANCREATOGRAPHY (ERCP) (N/A)  Patient Location: PACU  Anesthesia Type: General  Level of Consciousness: awake and alert   Airway and Oxygen Therapy: Patient Spontanous Breathing  Post-op Pain: mild  Post-op Assessment: Post-op Vital signs reviewed, Patient's Cardiovascular Status Stable, Respiratory Function Stable, Patent Airway and No signs of Nausea or vomiting  Last Vitals:  Filed Vitals:   03/08/15 1508  BP: 152/68  Pulse: 89  Temp: 36.7 C  Resp: 18    Post-op Vital Signs: stable   Complications: No apparent anesthesia complications

## 2015-03-08 NOTE — Anesthesia Preprocedure Evaluation (Addendum)
Anesthesia Evaluation  Patient identified by MRN, date of birth, ID band Patient awake    Reviewed: Allergy & Precautions, NPO status , Patient's Chart, lab work & pertinent test results  Airway Mallampati: II  TM Distance: >3 FB Neck ROM: Full    Dental no notable dental hx.    Pulmonary neg pulmonary ROS, Current Smoker,  breath sounds clear to auscultation  Pulmonary exam normal       Cardiovascular Exercise Tolerance: Good hypertension, Pt. on medications + Peripheral Vascular Disease Rhythm:Regular Rate:Normal     Neuro/Psych PSYCHIATRIC DISORDERS  Neuromuscular disease    GI/Hepatic negative GI ROS, Neg liver ROS,   Endo/Other  diabetes, Type 2, Oral Hypoglycemic Agents  Renal/GU negative Renal ROS  negative genitourinary   Musculoskeletal negative musculoskeletal ROS (+)   Abdominal   Peds negative pediatric ROS (+)  Hematology negative hematology ROS (+)   Anesthesia Other Findings   Reproductive/Obstetrics negative OB ROS                            Anesthesia Physical Anesthesia Plan  ASA: III  Anesthesia Plan: General   Post-op Pain Management:    Induction: Intravenous  Airway Management Planned: Oral ETT  Additional Equipment:   Intra-op Plan:   Post-operative Plan: Extubation in OR  Informed Consent: I have reviewed the patients History and Physical, chart, labs and discussed the procedure including the risks, benefits and alternatives for the proposed anesthesia with the patient or authorized representative who has indicated his/her understanding and acceptance.   Dental advisory given  Plan Discussed with: CRNA  Anesthesia Plan Comments:         Anesthesia Quick Evaluation

## 2015-03-08 NOTE — Progress Notes (Signed)
Patient ID: [name], female   DOB: 17-May-1934, 81 y.o.   MRN: [id number]     Progress Note   Subjective  Feels fine, pain has not recurred. Waiting for ERCP today..hungry   Objective   Vital signs in last 24 hours: Temp:  [98.2 F (36.8 C)-98.6 F (37 C)] 98.2 F (36.8 C) (04/13 0500) Pulse Rate:  [63-71] 67 (04/13 0500) Resp:  [14-18] 18 (04/13 0500) BP: (145-181)/(56-69) 165/69 mmHg (04/13 0500) SpO2:  [95 %-97 %] 96 % (04/13 0500) Last BM Date: 03/05/15 General:    Older asian  female in NAD Heart:  Regular rate and rhythm; no murmurs Lungs: Respirations even and unlabored, lungs CTA bilaterally Abdomen:  Soft, nontender and nondistended. Normal bowel sounds. Extremities:  Without edema. Neurologic:  Alert and oriented,  grossly normal neurologically. Psych:  Cooperative. Normal mood and affect.  Intake/Output from previous day: 04/12 0701 - 04/13 0700 In: 2270.2 [P.O.:1; I.V.:2269.2] Out: -  Intake/Output this shift:    Lab Results:  Recent Labs  03/06/15 1054 03/07/15 0524  WBC 7.0 7.0  HGB 15.0 13.9  HCT 45.5 42.2  PLT 246 249   BMET  Recent Labs  03/06/15 1054 03/07/15 0524 03/08/15 0507  NA 139 141 143  K 3.8 3.5 3.6  CL 103 111 112  CO2 27 24 25   GLUCOSE 101* 109* 162*  BUN 13 10 7   CREATININE 0.95 0.78 0.75  CALCIUM 9.5 8.5 8.6   LFT  Recent Labs  03/06/15 1334  03/08/15 0507  PROT 7.1  < > 5.9*  ALBUMIN 4.0  < > 3.1*  AST 476*  < > 49*  ALT 375*  < > 132*  ALKPHOS 199*  < > 125*  BILITOT 0.9  < > 0.6  BILIDIR 0.3  --   --   IBILI 0.6  --   --   < > = values in this interval not displayed. PT/INR No results for input(s): LABPROT, INR in the last 72 hours.  Studies/Results: Dg Chest 2 View  03/06/2015   CLINICAL DATA:  Central chest pain for 3 days  EXAM: CHEST  2 VIEW  COMPARISON:  08/02/2014  FINDINGS: Cardiac shadow is stable. The lungs are well aerated bilaterally. No focal infiltrate or sizable effusion is  seen. No acute bony abnormality is noted. Diffuse breast calcifications are seen.  IMPRESSION: No acute abnormality noted.   Electronically Signed   By: Inez Catalina M.D.   On: 03/06/2015 13:03   US Abdomen Complete  03/06/2015   CLINICAL DATA:  Elevated transaminases.  Mid abdominal pain.  EXAM: ULTRASOUND ABDOMEN COMPLETE  COMPARISON:  10/08/2013 ; 10/07/2013  FINDINGS: Gallbladder: Surgically absent  Common bile duct: Diameter: 11 mm  Liver: Intra and extrahepatic biliary dilatation. No focal lesion identified.  IVC: No abnormality visualized.  Pancreas: Stable mild dilatation of the dorsal pancreatic duct at 3 mm.  Spleen: Size and appearance within normal limits.  Right Kidney: Length: 9.5 cm. Echogenicity within normal limits. No mass or hydronephrosis visualized.  Left Kidney: Length: 9.5 cm. Echogenicity within normal limits. No mass or hydronephrosis visualized.  Abdominal aorta: No aneurysm visualized.  Atherosclerotic plaque.  Other findings: None.  IMPRESSION: 1. Intrahepatic and extrahepatic biliary dilatation without stones identified. 2. Stable mild dilatation of the dorsal pancreatic duct, and 3 mm diameter. 3. Atherosclerotic abdominal aorta.   Electronically Signed   By: Van Clines M.D.   On: 03/06/2015 17:30       Assessment /  Plan:   #1 81 yo female with mild biliary pancreatitis-rapidly resolved. Concern for CBD stone(s) with dilated ducts and elevated LFTs. She is for ERCP/stone extraction today Hopefully can advance diet later in day and possible discharge tomorrow  Principal Problem:   Acute pancreatitis Active Problems:   Diabetes mellitus without complication   Essential hypertension   Tobacco abuse   Transaminitis   Epigastric abdominal pain   Pain in the chest     LOS: 2 days   Amy Esterwood  03/08/2015, 10:04 AM      Attending physician's note   I have taken an interval history, reviewed the chart and examined the patient. I agree with the  Advanced Practitioner's note, impression and recommendations. Mild pancreatitis has resolved. LFTs have improved. ERCP today for suspected choledocholithiasis.   Pricilla Riffle. Fuller Plan, MD [facility]

## 2015-03-08 NOTE — Transfer of Care (Signed)
Immediate Anesthesia Transfer of Care Note  Patient: [name]  Procedure(s) Performed: Procedure(s): ENDOSCOPIC RETROGRADE CHOLANGIOPANCREATOGRAPHY (ERCP) (N/A)  Patient Location: PACU and Endoscopy Unit  Anesthesia Type:General  Level of Consciousness: awake, alert , oriented and patient cooperative  Airway & Oxygen Therapy: Patient Spontanous Breathing and Patient connected to face mask oxygen  Post-op Assessment: Report given to RN, Post -op Vital signs reviewed and stable and Patient moving all extremities  Post vital signs: Reviewed and stable  Last Vitals:  Filed Vitals:   03/08/15 1108  BP: 210/87  Pulse: 76  Temp: 36.7 C  Resp: 22    Complications: No apparent anesthesia complications

## 2015-03-09 ENCOUNTER — Inpatient Hospital Stay (HOSPITAL_COMMUNITY): Payer: Medicare Other

## 2015-03-09 DIAGNOSIS — K839 Disease of biliary tract, unspecified: Secondary | ICD-10-CM

## 2015-03-09 DIAGNOSIS — R935 Abnormal findings on diagnostic imaging of other abdominal regions, including retroperitoneum: Secondary | ICD-10-CM

## 2015-03-09 LAB — CBC
HCT: 39 % (ref 36.0–46.0)
Hemoglobin: 12.8 g/dL (ref 12.0–15.0)
MCH: 31.1 pg (ref 26.0–34.0)
MCHC: 32.8 g/dL (ref 30.0–36.0)
MCV: 94.9 fL (ref 78.0–100.0)
PLATELETS: 207 10*3/uL (ref 150–400)
RBC: 4.11 MIL/uL (ref 3.87–5.11)
RDW: 14.1 % (ref 11.5–15.5)
WBC: 7.2 10*3/uL (ref 4.0–10.5)

## 2015-03-09 LAB — GLUCOSE, CAPILLARY
GLUCOSE-CAPILLARY: 165 mg/dL — AB (ref 70–99)
GLUCOSE-CAPILLARY: 55 mg/dL — AB (ref 70–99)
Glucose-Capillary: 122 mg/dL — ABNORMAL HIGH (ref 70–99)
Glucose-Capillary: 128 mg/dL — ABNORMAL HIGH (ref 70–99)
Glucose-Capillary: 152 mg/dL — ABNORMAL HIGH (ref 70–99)
Glucose-Capillary: 17 mg/dL — CL (ref 70–99)
Glucose-Capillary: 176 mg/dL — ABNORMAL HIGH (ref 70–99)
Glucose-Capillary: 97 mg/dL (ref 70–99)

## 2015-03-09 LAB — COMPREHENSIVE METABOLIC PANEL
ALBUMIN: 2.9 g/dL — AB (ref 3.5–5.2)
ALK PHOS: 100 U/L (ref 39–117)
ALT: 93 U/L — ABNORMAL HIGH (ref 0–35)
AST: 34 U/L (ref 0–37)
Anion gap: 5 (ref 5–15)
BUN: 7 mg/dL (ref 6–23)
CALCIUM: 8.3 mg/dL — AB (ref 8.4–10.5)
CO2: 22 mmol/L (ref 19–32)
Chloride: 114 mmol/L — ABNORMAL HIGH (ref 96–112)
Creatinine, Ser: 0.78 mg/dL (ref 0.50–1.10)
GFR calc non Af Amer: 77 mL/min — ABNORMAL LOW (ref >90)
GFR, EST AFRICAN AMERICAN: 90 mL/min — AB (ref >90)
Glucose, Bld: 142 mg/dL — ABNORMAL HIGH (ref 70–99)
POTASSIUM: 3.1 mmol/L — AB (ref 3.5–5.1)
SODIUM: 141 mmol/L (ref 135–145)
TOTAL PROTEIN: 5.1 g/dL — AB (ref 6.0–8.3)
Total Bilirubin: 0.6 mg/dL (ref 0.3–1.2)

## 2015-03-09 LAB — LIPASE, BLOOD: Lipase: 69 U/L — ABNORMAL HIGH (ref 11–59)

## 2015-03-09 MED ORDER — GADOBENATE DIMEGLUMINE 529 MG/ML IV SOLN
10.0000 mL | Freq: Once | INTRAVENOUS | Status: AC | PRN
  Administered 2015-03-09: 10 mL via INTRAVENOUS

## 2015-03-09 MED ORDER — POTASSIUM CHLORIDE CRYS ER 20 MEQ PO TBCR
40.0000 meq | EXTENDED_RELEASE_TABLET | Freq: Once | ORAL | Status: AC
  Administered 2015-03-09: 40 meq via ORAL
  Filled 2015-03-09: qty 2

## 2015-03-09 MED ORDER — DEXTROSE 50 % IV SOLN
INTRAVENOUS | Status: AC
  Administered 2015-03-09: 25 mL
  Filled 2015-03-09: qty 50

## 2015-03-09 NOTE — Progress Notes (Signed)
Patient ID: [name], female   DOB: 01-Feb-1934, 81 y.o.   MRN: [id number]    Progress Note   Subjective  Feels good- no complaints-happy to have liquids but hungry.   Objective   Vital signs in last 24 hours: Temp:  [97.7 F (36.5 C)-98.8 F (37.1 C)] 98.8 F (37.1 C) (04/14 0431) Pulse Rate:  [73-118] 73 (04/14 0431) Resp:  [17-22] 17 (04/14 0431) BP: (152-210)/(38-87) 154/49 mmHg (04/14 0431) SpO2:  [93 %-100 %] 94 % (04/14 0431) Weight:  [98 lb (44.453 kg)] 98 lb (44.453 kg) (04/13 1108) Last BM Date: 03/08/15 General:    Asian  female in NAD Heart:  Regular rate and rhythm; no murmurs Lungs: Respirations even and unlabored, lungs CTA bilaterally Abdomen:  Soft, nontender and nondistended. Normal bowel sounds. Extremities:  Without edema. Neurologic:  Alert and oriented,  grossly normal neurologically. Psych:  Cooperative. Normal mood and affect.  Intake/Output from previous day: 04/13 0701 - 04/14 0700 In: 3650 [I.V.:3600; IV Piggyback:50] Out: -  Intake/Output this shift: Total I/O In: 360 [P.O.:360] Out: -   Lab Results:  Recent Labs  03/06/15 1054 03/07/15 0524 03/09/15 0450  WBC 7.0 7.0 7.2  HGB 15.0 13.9 12.8  HCT 45.5 42.2 39.0  PLT 246 249 207   BMET  Recent Labs  03/07/15 0524 03/08/15 0507 03/09/15 0450  NA 141 143 141  K 3.5 3.6 3.1*  CL 111 112 114*  CO2 24 25 22   GLUCOSE 109* 162* 142*  BUN 10 7 7   CREATININE 0.78 0.75 0.78  CALCIUM 8.5 8.6 8.3*   LFT  Recent Labs  03/06/15 1334  03/09/15 0450  PROT 7.1  < > 5.1*  ALBUMIN 4.0  < > 2.9*  AST 476*  < > 34  ALT 375*  < > 93*  ALKPHOS 199*  < > 100  BILITOT 0.9  < > 0.6  BILIDIR 0.3  --   --   IBILI 0.6  --   --   < > = values in this interval not displayed. PT/INR No results for input(s): LABPROT, INR in the last 72 hours.  Studies/Results: Dg C-arm 1-60 Min-no Report  03/08/2015   CLINICAL DATA: choledocholithiasis   C-ARM 1-60 MINUTES  Fluoroscopy was  utilized by the requesting physician.  No radiographic  interpretation.        Assessment / Plan:   #1 81 yo female with acute mild pancreatitis likley secondary to CBD stone with dilated ducts - LFT's have normalized , ? passed stone. ERCP unsuccessful-unable to cannulate CBD. Will proceed with MRCP today-if stones then refer to [facility] as outpt for second ERCP. Advance diet post MRCP OK from GI standpoint for pt to be discharged later today  Principal Problem:   Acute pancreatitis Active Problems:   Diabetes mellitus without complication   Essential hypertension   Tobacco abuse   Transaminitis   Epigastric abdominal pain   Pain in the chest   Abnormal [place] (ultrasound) of abdomen   Bile duct abnormality    LOS: 3 days   Amy Esterwood  03/09/2015, 9:56 AM      Attending physician's note   I have taken an interval history, reviewed the chart and examined the patient. I agree with the Advanced Practitioner's note, impression and recommendations. OK for discharge post MRCP. If MRCP normal no further evaluation needed. If positive for CBD stones or other biliary/pancreatic pathology then referral as outpatient to [facility] for repeat ERCP.  Pricilla Riffle.  Fuller Plan, MD [facility]

## 2015-03-09 NOTE — Progress Notes (Signed)
Pt had hypoglemic event at 1656, Blood sugar was 55, 67mls of D50 given sugar was 155. MD paged.

## 2015-03-09 NOTE — Discharge Summary (Addendum)
Physician Discharge Summary  [name] PPI:[id number] DOB: [birth year] DOA: 03/06/2015  PCP: Nyoka Cowden, MD  Admit date: 03/06/2015 Discharge date: 03/09/2015  Recommendations for Outpatient Follow-up:  1. Pt will need to follow up with PCP in 2-3 weeks post discharge 2. Please obtain BMP to evaluate electrolytes and kidney function, liver function tests, potassium level 3. Pt given one dose of K-dur prior to discharge 4. Pt scheduled for MRCP and based on the findings, may be discharged and ? Need referral to tertiary medical center   Discharge Diagnoses:  Principal Problem:   Acute pancreatitis Active Problems:   Diabetes mellitus without complication   Essential hypertension   Tobacco abuse   Transaminitis   Epigastric abdominal pain   Pain in the chest   Abnormal [place] (ultrasound) of abdomen   Bile duct abnormality    Discharge Condition: Stable  Diet recommendation: Heart healthy diet discussed in details    81 year old female with past medical history of diabetes, hypertension, hypercholesterolemia, presented with upper abdominal pain. She had elevated lipase and transaminases. There was a concern for choledocholithiasis. She is status post cholecystectomy. She was admitted for further management. Gastroenterology was consulted. Plan is for ERCP on 4/13.  Assessment/Plan:    Principal Problem:  Acute pancreatitis with transaminitis  - [place] notable for diffuse biliary dilation and mild stable PD dilation - LFT's trending down, lipase also trending down - pt reports feeling better and wants to go home - ERCP attempted but unable to cannulate CBD or PD - plan for MRCP and d/c home after the procedure  Active Problems:  Diabetes mellitus without complication - reasonable inpatient control  Essential hypertension - likely exacerbated from pain - resume home medical regimen upon discharge   Tobacco abuse - cessation consultation  provided, questions answered - provided nicotine patch   Pain in the chest - most likely secondary to principal problem, troponin level normal - CXR with no acute abnormalities  - EKG with no acute findings - now resolved  Cystic lesion in posterior right scalp - Appears to be a sebaceous cyst. Her primary care physician is monitoring.  DVT prophylaxis - SCD's  Code Status: Full.  Family Communication: plan of care discussed with the patient Disposition Plan: Home after MRCP  IV access:  Peripheral IV  Procedures and diagnostic studies:   Dg Chest 2 View 03/06/2015 No acute abnormality noted.   US Abdomen Complete 03/06/2015 . Intrahepatic and extrahepatic biliary dilatation without stones identified. 2. Stable mild dilatation of the dorsal pancreatic duct, and 3 mm diameter. 3. Atherosclerotic abdominal aorta.   Medical Consultants:  GI   Other Consultants:  None   IAnti-Infectives:   None       Discharge Exam: Filed Vitals:   03/09/15 0431  BP: 154/49  Pulse: 73  Temp: 98.8 F (37.1 C)  Resp: 17   Filed Vitals:   03/08/15 1450 03/08/15 1508 03/08/15 2029 03/09/15 0431  BP: 163/57 152/68 166/52 154/49  Pulse: 96 89 80 73  Temp:  98 F (36.7 C) 98.1 F (36.7 C) 98.8 F (37.1 C)  TempSrc:  Oral Oral Oral  Resp: _0 Height:      Weight:      SpO2: 93% 97% 98% 94%    General: Pt is alert, follows commands appropriately, not in acute distress Cardiovascular: Regular rate and rhythm, S1/S2 +, no murmurs, no rubs, no gallops Respiratory: Clear to auscultation bilaterally, no wheezing, no crackles, no rhonchi Abdominal: Soft,  non tender, non distended, bowel sounds +, no guarding Extremities: no edema, no cyanosis, pulses palpable bilaterally DP and PT  Discharge Instructions  Discharge Instructions    Diet - low sodium heart healthy    Complete by:  As directed      Increase activity slowly    Complete by:  As  directed             Medication List    STOP taking these medications        atorvastatin 20 MG tablet  Commonly known as:  LIPITOR      TAKE these medications        aspirin EC 81 MG tablet  Take 81 mg by mouth at bedtime.     hydrochlorothiazide 12.5 MG tablet  Commonly known as:  HYDRODIURIL  Take 1 tablet (12.5 mg total) by mouth daily.     ibuprofen 200 MG tablet  Commonly known as:  ADVIL,MOTRIN  Take 200 mg by mouth every 6 (six) hours as needed for mild pain or moderate pain.     lisinopril 20 MG tablet  Commonly known as:  PRINIVIL,ZESTRIL  Take 1 tablet (20 mg total) by mouth 2 (two) times daily.     metFORMIN 500 MG tablet  Commonly known as:  GLUCOPHAGE  TAKE ONE TABLET BY MOUTH TWICE DAILY WITH FOOD     ONE TOUCH ULTRA SYSTEM KIT W/DEVICE Kit  USE TO CHECK BLOOD SUGAR DAILY AND RPN     ONE TOUCH ULTRA TEST test strip  Generic drug:  glucose blood  USE TO TEST BLOOD SUGAR DAILY AS DIRECTED     tetrahydrozoline-zinc 0.05-0.25 % ophthalmic solution  Commonly known as:  VISINE-AC  Place 1 drop into both eyes daily as needed (irritation).     WOMENS 50+ MULTI VITAMIN/MIN PO  Take 1 tablet by mouth every morning.           Follow-up Information    Follow up with Nyoka Cowden, MD.   Specialty:  Internal Medicine   Contact information:   [place] [address] [phone]       Call [name], MD.   Specialty:  Internal Medicine   Why:  As needed   Contact information:   [address] [place]  [address] [phone]        The results of significant diagnostics from this hospitalization (including imaging, microbiology, ancillary and laboratory) are listed below for reference.     Microbiology: No results found for this or any previous visit (from the past 240 hour(s)).   Labs: Basic Metabolic Panel:  Recent Labs Lab 03/06/15 1054 03/07/15 0524 03/08/15 0507  03/09/15 0450  NA 139 141 143 141  K 3.8 3.5 3.6 3.1*  CL 103 111 112 114*  CO2 _0 GLUCOSE 101* 109* 162* 142*  BUN _1 CREATININE 0.95 0.78 0.75 0.78  CALCIUM 9.5 8.5 8.6 8.3*   Liver Function Tests:  Recent Labs Lab 03/06/15 1334 03/07/15 0524 03/08/15 0507 03/09/15 0450  AST 476* 132* 49* 34  ALT 375* 210* 132* 93*  ALKPHOS 199* 143* 125* 100  BILITOT 0.9 0.4 0.6 0.6  PROT 7.1 5.9* 5.9* 5.1*  ALBUMIN 4.0 3.3* 3.1* 2.9*    Recent Labs Lab 03/06/15 1334 03/07/15 0524 03/08/15 0507 03/09/15 0450  LIPASE 127* 75* 74* 69*   CBC:  Recent Labs Lab 03/06/15 1054 03/07/15 0524 03/09/15 0450  WBC  7.0 7.0 7.2  HGB 15.0 13.9 12.8  HCT 45.5 42.2 39.0  MCV 95.2 94.6 94.9  PLT 246 249 207   Cardiac Enzymes:  Recent Labs Lab 03/06/15 1855 03/07/15 03/07/15 0524  TROPONINI <0.03 <0.03 <0.03    CBG:  Recent Labs Lab 03/08/15 1544 03/08/15 2032 03/09/15 0012 03/09/15 0430 03/09/15 0732  GLUCAP 147* 189* 97 122* 128*     SIGNED: Time coordinating discharge: 30 minutes  MAGICK-Lucus Lambertson, MD  [facility] 03/09/2015, 9:38 AM Pager [phone]  If 7PM-7AM, please contact night-coverage [email or web address] Password [email or web address]

## 2015-03-10 ENCOUNTER — Encounter (HOSPITAL_COMMUNITY): Payer: Self-pay | Admitting: Gastroenterology

## 2015-03-10 LAB — GLUCOSE, CAPILLARY
GLUCOSE-CAPILLARY: 161 mg/dL — AB (ref 70–99)
GLUCOSE-CAPILLARY: 157 mg/dL — AB (ref 70–99)
Glucose-Capillary: 168 mg/dL — ABNORMAL HIGH (ref 70–99)

## 2015-03-10 MED ORDER — HYDRALAZINE HCL 20 MG/ML IJ SOLN: 20.0000 mg | Freq: Four times a day (QID) | INTRAMUSCULAR | Status: DC | PRN

## 2015-03-10 MED ORDER — HYDRALAZINE HCL 20 MG/ML IJ SOLN
10.0000 mg | Freq: Once | INTRAMUSCULAR | Status: AC
  Administered 2015-03-10: 10 mg via INTRAVENOUS
  Filled 2015-03-10: qty 1

## 2015-03-10 NOTE — Progress Notes (Signed)
Hypoglycemic event post MRI. CBG 17. 81mL of D50 given with apple juice snack. Fluids restarted per order. BP also high at this time. Hydralazine given. Pt asymptomatic of both events. MD paged regarding BP elevation and gave new orders. Will continue to monitor pt closely and carry out plan of care. [name] I

## 2015-03-10 NOTE — Care Management Note (Addendum)
    Page 1 of 1   03/10/2015     12:49:17 PM CARE MANAGEMENT NOTE 03/10/2015  Patient:  [name],[name]   Account Number:  [id number]  Date Initiated:  03/07/2015  Documentation initiated by:  Dessa Phi  Subjective/Objective Assessment:   81 y/o f admitted w/Acute pancreatitis.     Action/Plan:   From home.   Anticipated DC Date:  03/10/2015   Anticipated DC Plan:  [place]  CM consult      Choice offered to / List presented to:             Status of service:  Completed, signed off Medicare Important Message given?  YES (If response is "NO", the following Medicare IM given date fields will be blank) Date Medicare IM given:  03/09/2015 Medicare IM given by:  [facility] Date Additional Medicare IM given:   Additional Medicare IM given by:    Discharge Disposition:  HOME/SELF CARE  Per UR Regulation:  Reviewed for med. necessity/level of care/duration of stay  If discussed at [place] of Stay Meetings, dates discussed:    Comments:  03/10/15 Dessa Phi RN BSN NCM [phone] d/c home no needs.  03/07/15 Dessa Phi RN BSN NCM [phone] No anticipated d/c needs.

## 2015-03-10 NOTE — Progress Notes (Signed)
Pt stable for discharge, please see d/c summary 03/09/2015. No changes needed.   [name], MD  [facility] Pager [phone]  If 7PM-7AM, please contact night-coverage [email or web address] Password [email or web address]

## 2015-03-10 NOTE — Progress Notes (Signed)
Patient ID: [name], female   DOB: 1934-10-18, 81 y.o.   MRN: [id number]    MRCP reviewed-no stones seen but has mild narrowing of distal CBD and biliary dilation Mild pancreatitis has resolved  She may be discharged this am. She will need another attempt  at ERCP, and we are arranging referral to [facility]. Dr Lynne Leader office will call her early next week with appt time Discussed with pt and family.

## 2015-03-13 NOTE — Telephone Encounter (Signed)
I spoke with the patient's niece.  She is the best contact to arrange the ERCP at [facility].  I advised her I will contact [facility] and give them her contact information.  She is asked to call me back if she has not heard from them by Wed.  I spoke with [facility] and they will update her contact information.  They gave me a contact number of [phone] to have the patient or her family call to schedule the ERCP.  I spoke with the niece again she will contact [facility] and set up appt

## 2015-03-20 ENCOUNTER — Other Ambulatory Visit: Payer: Self-pay

## 2015-03-20 NOTE — Telephone Encounter (Signed)
Patient is scheduled for ERCP and pre-op on 04/10/15 with Dr. Newman Pies at [facility].  The appt was scheduled with the patient's niece and they are aware of the appt date and time.

## 2015-03-23 ENCOUNTER — Ambulatory Visit (INDEPENDENT_AMBULATORY_CARE_PROVIDER_SITE_OTHER): Payer: Medicare Other | Admitting: Family Medicine

## 2015-03-23 ENCOUNTER — Encounter: Payer: Self-pay | Admitting: Family Medicine

## 2015-03-23 VITALS — BP 128/72 | Temp 98.3°F | Wt 99.0 lb

## 2015-03-23 DIAGNOSIS — I10 Essential (primary) hypertension: Secondary | ICD-10-CM | POA: Diagnosis not present

## 2015-03-23 DIAGNOSIS — K839 Disease of biliary tract, unspecified: Secondary | ICD-10-CM | POA: Diagnosis not present

## 2015-03-23 DIAGNOSIS — I6523 Occlusion and stenosis of bilateral carotid arteries: Secondary | ICD-10-CM | POA: Diagnosis not present

## 2015-03-23 DIAGNOSIS — E119 Type 2 diabetes mellitus without complications: Secondary | ICD-10-CM | POA: Diagnosis not present

## 2015-03-23 DIAGNOSIS — K851 Biliary acute pancreatitis without necrosis or infection: Secondary | ICD-10-CM

## 2015-03-23 DIAGNOSIS — Z720 Tobacco use: Secondary | ICD-10-CM

## 2015-03-23 LAB — BASIC METABOLIC PANEL
BUN: 12 mg/dL (ref 6–23)
CO2: 27 meq/L (ref 19–32)
Calcium: 9.6 mg/dL (ref 8.4–10.5)
Chloride: 101 mEq/L (ref 96–112)
Creatinine, Ser: 0.88 mg/dL (ref 0.40–1.20)
GFR: 65.85 mL/min (ref >60.00)
GLUCOSE: 81 mg/dL (ref 70–99)
Potassium: 3.6 mEq/L (ref 3.5–5.1)
SODIUM: 136 meq/L (ref 135–145)

## 2015-03-23 LAB — HEPATIC FUNCTION PANEL
ALK PHOS: 88 U/L (ref 39–117)
ALT: 14 U/L (ref 0–35)
AST: 17 U/L (ref 0–37)
Albumin: 4.2 g/dL (ref 3.5–5.2)
BILIRUBIN DIRECT: 0.1 mg/dL (ref 0.0–0.3)
BILIRUBIN TOTAL: 0.5 mg/dL (ref 0.2–1.2)
Total Protein: 6.8 g/dL (ref 6.0–8.3)

## 2015-03-23 LAB — LIPID PANEL
CHOL/HDL RATIO: 3
Cholesterol: 141 mg/dL (ref 0–200)
HDL: 53.6 mg/dL (ref >39.00)
LDL CALC: 61 mg/dL (ref 0–99)
NonHDL: 87.4
TRIGLYCERIDES: 132 mg/dL (ref 0.0–149.0)
VLDL: 26.4 mg/dL (ref 0.0–40.0)

## 2015-03-23 NOTE — Patient Instructions (Signed)
Stop taking the aspirin one week prior to the procedure  Stop smoking completely............ nicotine patches 1 daily  Do not take any NSAIDs........[name] Motrin...Marland KitchenMarland KitchenMarland Kitchen Aleve.......[name] etc. Etc................ only Tylenol  Labs today...........[name] Dr. Raliegh Ip we'll call you the report next week

## 2015-03-23 NOTE — Progress Notes (Signed)
Pre visit review using our clinic review tool, if applicable. No additional management support is needed unless otherwise documented below in the visit note. 

## 2015-03-23 NOTE — Progress Notes (Signed)
   Subjective:    Patient ID: [name], female    DOB: [birth year], 81 y.o.   MRN: [id number]  HPI [name] is a 81 year old female single smoker who comes in today company by her niece...Marland KitchenMarland KitchenMarland Kitchen she is a primary care patient of Dr. Raliegh Ip......... for follow-up of a hospitalization for acute pancreatitis  She was hospitalized April 11 to the 14th with acute pancreatitis. They were unable to do a pancreatic duct dilatation. She scheduled for MRCP at [place] May 16.  Also during her hospital stay the added Lipitor 20 mg daily.  They gave her nicotine patches and advised to stop smoking completely but she went home and started smoking again.  Also she is taking a fair amount of Motrin. Advised to stop that because she is on lisinopril.  They also recommended follow-up lab work today metabolic panel LFTs.   Review of Systems    review of systems otherwise negative Objective:   Physical Exam  Well-developed thin female no acute distress vital signs stable she's afebrile      Assessment & Plan:  Acute pancreatitis.......... follow-up GI  Tobacco abuse.......[name] emphasize the nicotine patch stop smoking completely  Abnormal labs follow-up....... labs today  Taking too much NSAIDs........ stop NSAIDs only Tylenol.

## 2015-03-24 ENCOUNTER — Telehealth: Payer: Self-pay | Admitting: Internal Medicine

## 2015-04-05 ENCOUNTER — Telehealth: Payer: Self-pay | Admitting: Internal Medicine

## 2015-04-05 NOTE — Telephone Encounter (Signed)
Pt need blood work results. Pt saw dr Sherren Mocha

## 2015-04-05 NOTE — Telephone Encounter (Signed)
Error/njr °

## 2015-04-06 NOTE — Telephone Encounter (Signed)
Please advise lab results from 4/28.

## 2015-04-06 NOTE — Telephone Encounter (Signed)
Spoke to pt, told her lab results were normal per Dr. Raliegh Ip. Pt verbalized understanding.

## 2015-04-06 NOTE — Telephone Encounter (Signed)
Please call/notify patient that lab/test/procedure is normal 

## 2015-04-10 DIAGNOSIS — I7300 Raynaud's syndrome without gangrene: Secondary | ICD-10-CM | POA: Diagnosis not present

## 2015-04-10 DIAGNOSIS — F17210 Nicotine dependence, cigarettes, uncomplicated: Secondary | ICD-10-CM | POA: Diagnosis not present

## 2015-04-10 DIAGNOSIS — E119 Type 2 diabetes mellitus without complications: Secondary | ICD-10-CM | POA: Diagnosis not present

## 2015-04-10 DIAGNOSIS — I6523 Occlusion and stenosis of bilateral carotid arteries: Secondary | ICD-10-CM | POA: Diagnosis not present

## 2015-04-10 DIAGNOSIS — Z79899 Other long term (current) drug therapy: Secondary | ICD-10-CM | POA: Diagnosis not present

## 2015-04-10 DIAGNOSIS — K861 Other chronic pancreatitis: Secondary | ICD-10-CM | POA: Diagnosis not present

## 2015-04-10 DIAGNOSIS — Z7982 Long term (current) use of aspirin: Secondary | ICD-10-CM | POA: Diagnosis not present

## 2015-04-10 DIAGNOSIS — E785 Hyperlipidemia, unspecified: Secondary | ICD-10-CM | POA: Diagnosis not present

## 2015-04-10 DIAGNOSIS — I10 Essential (primary) hypertension: Secondary | ICD-10-CM | POA: Diagnosis not present

## 2015-04-10 DIAGNOSIS — K838 Other specified diseases of biliary tract: Secondary | ICD-10-CM | POA: Diagnosis not present

## 2015-04-14 ENCOUNTER — Other Ambulatory Visit: Payer: Self-pay | Admitting: *Deleted

## 2015-04-14 MED ORDER — ONETOUCH ULTRASOFT LANCETS MISC: Status: DC

## 2015-04-21 ENCOUNTER — Other Ambulatory Visit: Payer: Self-pay | Admitting: Family Medicine

## 2015-04-21 MED ORDER — ONETOUCH DELICA LANCETS 33G MISC: Status: DC

## 2015-04-27 ENCOUNTER — Telehealth: Payer: Self-pay

## 2015-04-27 MED ORDER — HYDROCHLOROTHIAZIDE 12.5 MG PO TABS: 12.5000 mg | ORAL_TABLET | Freq: Every day | ORAL | Status: DC

## 2015-04-27 MED ORDER — IBUPROFEN 200 MG PO TABS: 200.0000 mg | ORAL_TABLET | Freq: Four times a day (QID) | ORAL | Status: DC | PRN

## 2015-04-27 NOTE — Telephone Encounter (Signed)
Rx's sent to pharmacy.  

## 2015-04-27 NOTE — Telephone Encounter (Signed)
[facility] - [place], [place] - [address]: ibuprofen (ADVIL,MOTRIN) 200 MG tablet, hydrochlorothiazide (HYDRODIURIL) 12.5 MG tablet

## 2015-06-21 ENCOUNTER — Ambulatory Visit: Payer: Medicare Other | Admitting: Internal Medicine

## 2015-07-24 ENCOUNTER — Ambulatory Visit (INDEPENDENT_AMBULATORY_CARE_PROVIDER_SITE_OTHER): Payer: Medicare Other | Admitting: Internal Medicine

## 2015-07-24 ENCOUNTER — Encounter: Payer: Self-pay | Admitting: Internal Medicine

## 2015-07-24 VITALS — BP 142/70 | HR 85 | Temp 98.4°F | Resp 18 | Ht 60.0 in | Wt 100.0 lb

## 2015-07-24 DIAGNOSIS — I6523 Occlusion and stenosis of bilateral carotid arteries: Secondary | ICD-10-CM | POA: Diagnosis not present

## 2015-07-24 DIAGNOSIS — R945 Abnormal results of liver function studies: Secondary | ICD-10-CM

## 2015-07-24 DIAGNOSIS — I10 Essential (primary) hypertension: Secondary | ICD-10-CM

## 2015-07-24 DIAGNOSIS — R7989 Other specified abnormal findings of blood chemistry: Secondary | ICD-10-CM | POA: Diagnosis not present

## 2015-07-24 DIAGNOSIS — E119 Type 2 diabetes mellitus without complications: Secondary | ICD-10-CM

## 2015-07-24 LAB — HEMOGLOBIN A1C: Hgb A1c MFr Bld: 6.5 % (ref 4.6–6.5)

## 2015-07-24 NOTE — Patient Instructions (Signed)
Limit your sodium (Salt) intake  Please check your blood pressure on a regular basis.  If it is consistently greater than 150/90, please make an office appointment.   Please check your hemoglobin A1c every 6 months

## 2015-07-24 NOTE — Progress Notes (Signed)
Pre visit review using our clinic review tool, if applicable. No additional management support is needed unless otherwise documented below in the visit note. 

## 2015-07-24 NOTE — Progress Notes (Signed)
Subjective:    Patient ID: [name], female    DOB: 25-Aug-1934, 81 y.o.   MRN: [id number]  HPI  81 year old patient who has type 2 diabetes and essential hypertension.  She was admitted in the spring for acute pancreatitis.  She has done quite well, although adjusting to the recent death of her husband earlier this month. She has type 2 diabetes and states she did have an eye examination about 4 months ago. She has treated hypertension and has been on hydrochlorothiazide as well as lisinopril.  She is on statin therapy for dyslipidemia. No cardiopulmonary complaints.  Past Medical History  Diagnosis Date  . Diabetes mellitus, type 2   . Hyperlipidemia   . Hypertension   . History of cervical cancer   . TN (trigeminal neuralgia)   . Raynaud's phenomenon     Social History   Social History  . Marital Status: Married    Spouse Name: [name]  . Number of Children: 1  . Years of Education: N/A   Occupational History  . Unemployed    Social History Main Topics  . Smoking status: Current Every Day Smoker -- 1.50 packs/day for 60 years    Types: Cigarettes  . Smokeless tobacco: Never Used  . Alcohol Use: No  . Drug Use: No  . Sexual Activity: Not on file   Other Topics Concern  . Not on file   Social History Narrative   Married.  Lives in [place] with her husband.  No FH of heart disease.    Past Surgical History  Procedure Laterality Date  . Breast biopsy    . Breast enhancement surgery    . Total abdominal hysterectomy  1963    for cervical cancer in situ  . Cholecystectomy N/A 02/14/2014    Procedure: LAPAROSCOPIC CHOLECYSTECTOMY;  Surgeon: Ralene Ok, MD;  Location: [facility];  Service: General;  Laterality: N/A;  . Ercp N/A 03/08/2015    Procedure: ENDOSCOPIC RETROGRADE CHOLANGIOPANCREATOGRAPHY (ERCP);  Surgeon: Ladene Artist, MD;  Location: [facility];  Service: Endoscopy;  Laterality: N/A;    No family history on file.  Allergies    Allergen Reactions  . Morphine And Related Itching    Short lived, mild itching.    Current Outpatient Prescriptions on File Prior to Visit  Medication Sig Dispense Refill  . aspirin EC 81 MG tablet Take 81 mg by mouth at bedtime.    [name] atorvastatin (LIPITOR) 20 MG tablet Take 20 mg by mouth daily.    . Blood Glucose Monitoring Suppl (ONE TOUCH ULTRA SYSTEM KIT) W/DEVICE KIT USE TO CHECK BLOOD SUGAR DAILY AND RPN 1 each 0  . hydrochlorothiazide (HYDRODIURIL) 12.5 MG tablet Take 1 tablet (12.5 mg total) by mouth daily. 90 tablet 1  . ibuprofen (ADVIL,MOTRIN) 200 MG tablet Take 1 tablet (200 mg total) by mouth every 6 (six) hours as needed for mild pain or moderate pain. 30 tablet 5  . Lancets (ONETOUCH ULTRASOFT) lancets Once daily or PRN, Dx E11.9 100 each 12  . lisinopril (PRINIVIL,ZESTRIL) 20 MG tablet Take 1 tablet (20 mg total) by mouth 2 (two) times daily. 90 tablet 1  . metFORMIN (GLUCOPHAGE) 500 MG tablet TAKE ONE TABLET BY MOUTH TWICE DAILY WITH FOOD 180 tablet 1  . Multiple Vitamins-Minerals (WOMENS 50+ MULTI VITAMIN/MIN PO) Take 1 tablet by mouth every morning.     . ONE TOUCH ULTRA TEST test strip USE TO TEST BLOOD SUGAR DAILY AS DIRECTED 100 each 12  . ONETOUCH  DELICA LANCETS 91Y MISC Use as directed 11 each 11  . tetrahydrozoline-zinc (VISINE-AC) 0.05-0.25 % ophthalmic solution Place 1 drop into both eyes daily as needed (irritation).      No current facility-administered medications on file prior to visit.    BP 142/70 mmHg  Pulse 85  Temp(Src) 98.4 F (36.9 C) (Oral)  Resp 18  Ht 5' (1.524 m)  Wt 100 lb (45.36 kg)  BMI 19.53 kg/m2  SpO2 98%     Review of Systems  Constitutional: Negative.   HENT: Negative for congestion, dental problem, hearing loss, rhinorrhea, sinus pressure, sore throat and tinnitus.   Eyes: Negative for pain, discharge and visual disturbance.  Respiratory: Negative for cough and shortness of breath.   Cardiovascular: Negative for chest  pain, palpitations and leg swelling.  Gastrointestinal: Negative for nausea, vomiting, abdominal pain, diarrhea, constipation, blood in stool and abdominal distention.  Genitourinary: Negative for dysuria, urgency, frequency, hematuria, flank pain, vaginal bleeding, vaginal discharge, difficulty urinating, vaginal pain and pelvic pain.  Musculoskeletal: Negative for joint swelling, arthralgias and gait problem.  Skin: Negative for rash.  Neurological: Negative for dizziness, syncope, speech difficulty, weakness, numbness and headaches.  Hematological: Negative for adenopathy.  Psychiatric/Behavioral: Negative for behavioral problems, dysphoric mood and agitation. The patient is not nervous/anxious.        Objective:   Physical Exam  Constitutional: She is oriented to person, place, and time. She appears well-developed and well-nourished.  HENT:  Head: Normocephalic.  Right Ear: External ear normal.  Left Ear: External ear normal.  Mouth/Throat: Oropharynx is clear and moist.  Eyes: Conjunctivae and EOM are normal. Pupils are equal, round, and reactive to light.  Neck: Normal range of motion. Neck supple. No thyromegaly present.  Cardiovascular: Normal rate, regular rhythm, normal heart sounds and intact distal pulses.   Pulmonary/Chest: Effort normal and breath sounds normal.  Abdominal: Soft. Bowel sounds are normal. She exhibits no mass. There is no tenderness.  Musculoskeletal: Normal range of motion.  Lymphadenopathy:    She has no cervical adenopathy.  Neurological: She is alert and oriented to person, place, and time.  Skin: Skin is warm and dry. No rash noted.  Psychiatric: She has a normal mood and affect. Her behavior is normal.          Assessment & Plan:   Diabetes mellitus.  Will check a hemoglobin A1c Hypertension, well-controlled.  We'll continue ACE inhibitor and Dyslipidemia.  Continue statin therapy History pancreatitis, stable  Recheck 6 months

## 2015-08-04 ENCOUNTER — Other Ambulatory Visit: Payer: Self-pay | Admitting: Internal Medicine

## 2015-08-07 ENCOUNTER — Other Ambulatory Visit: Payer: Self-pay | Admitting: Family Medicine

## 2015-08-21 DIAGNOSIS — H40033 Anatomical narrow angle, bilateral: Secondary | ICD-10-CM | POA: Diagnosis not present

## 2015-08-21 DIAGNOSIS — H2513 Age-related nuclear cataract, bilateral: Secondary | ICD-10-CM | POA: Diagnosis not present

## 2015-08-30 DIAGNOSIS — H04123 Dry eye syndrome of bilateral lacrimal glands: Secondary | ICD-10-CM | POA: Diagnosis not present

## 2015-09-07 ENCOUNTER — Other Ambulatory Visit: Payer: Self-pay | Admitting: Internal Medicine

## 2015-10-24 ENCOUNTER — Other Ambulatory Visit: Payer: Self-pay | Admitting: Internal Medicine

## 2015-11-09 ENCOUNTER — Telehealth: Payer: Self-pay | Admitting: Internal Medicine

## 2015-11-09 NOTE — Telephone Encounter (Signed)
Pt called saying she wanted to see Dr. Raliegh Ip due to her BP being increased. During the day it's around 180/ but at night it's around 200/200. She wanted to wait for an appt with Dr. Raliegh Ip but I transferred her to the triage nurse.  Thank you.

## 2015-11-09 NOTE — Telephone Encounter (Signed)
Spoke with patient and she does not want to go to the ED today.  She would like a prescription for a "stronger medication".  Advised patient to go to urgent care or ED.  She says she will go tomorrow.

## 2015-11-09 NOTE — Telephone Encounter (Signed)
PLEASE NOTE: All timestamps contained within this report are represented as [place] Standard Time. CONFIDENTIALTY NOTICE: This fax transmission is intended only for the addressee. It contains information that is legally privileged, confidential or otherwise protected from use or disclosure. If you are not the intended recipient, you are strictly prohibited from reviewing, disclosing, copying using or disseminating any of this information or taking any action in reliance on or regarding this information. If you have received this fax in error, please notify us immediately by telephone so that we can arrange for its return to [place]. Phone: [phone], Toll-Free: [phone], Fax: [phone] Page: 1 of 1 Call Id: [id number] [facility] - Client [place] Patient Name: [name] DOB: November 19, 1934 Initial Comment Caller states she is having high BP- 200/107. Nurse Assessment Nurse: Vallery Sa, RN, Cathy Date/Time (Eastern Time): 11/09/2015 1:14:05 PM Confirm and document reason for call. If symptomatic, describe symptoms. ---Caller states her blood pressure was 200/107 last night. No severe breathing difficulty or chest pain. Alert and responsive. Has the patient traveled out of the country within the last 30 days? ---No Does the patient have any new or worsening symptoms? ---Yes Will a triage be completed? ---Yes Related visit to physician within the last 2 weeks? ---No Does the PT have any chronic conditions? (i.e. diabetes, asthma, etc.) ---Yes List chronic conditions. ---High Blood Pressure, Diabetes, High Cholesterol Is this a behavioral health or substance abuse call? ---No Guidelines Guideline Title Affirmed Question Affirmed Notes High Blood Pressure [1] BP # 160 / 100 AND [2] cardiac or neurologic symptoms (e.g., chest pain, difficulty breathing, unsteady gait, blurred vision) Final Disposition  User Go to ED Now Vallery Sa, RN, Cathy Referrals [facility] - ED Disagree/Comply: Comply

## 2015-11-10 ENCOUNTER — Emergency Department (HOSPITAL_COMMUNITY)
Admission: EM | Admit: 2015-11-10 | Discharge: 2015-11-10 | Disposition: A | Discharge status: Home / Self Care | Payer: Medicare Other | Attending: Emergency Medicine | Admitting: Emergency Medicine

## 2015-11-10 ENCOUNTER — Telehealth: Payer: Self-pay | Admitting: Internal Medicine

## 2015-11-10 ENCOUNTER — Encounter (HOSPITAL_COMMUNITY): Payer: Self-pay | Admitting: Emergency Medicine

## 2015-11-10 DIAGNOSIS — E119 Type 2 diabetes mellitus without complications: Secondary | ICD-10-CM | POA: Insufficient documentation

## 2015-11-10 DIAGNOSIS — Z8669 Personal history of other diseases of the nervous system and sense organs: Secondary | ICD-10-CM | POA: Diagnosis not present

## 2015-11-10 DIAGNOSIS — Z79899 Other long term (current) drug therapy: Secondary | ICD-10-CM | POA: Insufficient documentation

## 2015-11-10 DIAGNOSIS — Z7982 Long term (current) use of aspirin: Secondary | ICD-10-CM | POA: Diagnosis not present

## 2015-11-10 DIAGNOSIS — Z8541 Personal history of malignant neoplasm of cervix uteri: Secondary | ICD-10-CM | POA: Diagnosis not present

## 2015-11-10 DIAGNOSIS — I10 Essential (primary) hypertension: Secondary | ICD-10-CM | POA: Insufficient documentation

## 2015-11-10 DIAGNOSIS — F17210 Nicotine dependence, cigarettes, uncomplicated: Secondary | ICD-10-CM | POA: Diagnosis not present

## 2015-11-10 DIAGNOSIS — E785 Hyperlipidemia, unspecified: Secondary | ICD-10-CM | POA: Insufficient documentation

## 2015-11-10 LAB — BASIC METABOLIC PANEL
ANION GAP: 10 (ref 5–15)
BUN: 15 mg/dL (ref 6–20)
CHLORIDE: 103 mmol/L (ref 101–111)
CO2: 28 mmol/L (ref 22–32)
Calcium: 9.7 mg/dL (ref 8.9–10.3)
Creatinine, Ser: 1 mg/dL (ref 0.44–1.00)
GFR calc non Af Amer: 52 mL/min — ABNORMAL LOW (ref >60)
Glucose, Bld: 104 mg/dL — ABNORMAL HIGH (ref 65–99)
Potassium: 4.1 mmol/L (ref 3.5–5.1)
Sodium: 141 mmol/L (ref 135–145)

## 2015-11-10 MED ORDER — CLONIDINE HCL 0.1 MG PO TABS
0.1000 mg | ORAL_TABLET | Freq: Once | ORAL | Status: AC
  Administered 2015-11-10: 0.1 mg via ORAL
  Filled 2015-11-10: qty 1

## 2015-11-10 NOTE — ED Provider Notes (Signed)
CSN: [id number]     Arrival date & time 11/10/15  0258 History   First MD Initiated Contact with Patient 11/10/15 1031     Chief Complaint  Patient presents with  . Hypertension     (Consider location/radiation/quality/duration/timing/severity/associated sxs/prior Treatment) HPI Comments: No recent changes to her htn meds for several years--nl bp is 150s/80s  Patient is a 81 y.o. female presenting with hypertension. The history is provided by the patient.  Hypertension This is a recurrent problem. The current episode started 2 days ago. The problem occurs constantly. The problem has not changed since onset.Pertinent negatives include no chest pain and no headaches. Nothing aggravates the symptoms. Nothing relieves the symptoms. She has tried nothing for the symptoms.    Past Medical History  Diagnosis Date  . Diabetes mellitus, type 2 ([place])   . Hyperlipidemia   . Hypertension   . History of cervical cancer   . TN (trigeminal neuralgia)   . Raynaud's phenomenon    Past Surgical History  Procedure Laterality Date  . Breast biopsy    . Breast enhancement surgery    . Total abdominal hysterectomy  1963    for cervical cancer in situ  . Cholecystectomy N/A 02/14/2014    Procedure: LAPAROSCOPIC CHOLECYSTECTOMY;  Surgeon: Ralene Ok, MD;  Location: [place];  Service: General;  Laterality: N/A;  . Ercp N/A 03/08/2015    Procedure: ENDOSCOPIC RETROGRADE CHOLANGIOPANCREATOGRAPHY (ERCP);  Surgeon: Ladene Artist, MD;  Location: [facility];  Service: Endoscopy;  Laterality: N/A;   No family history on file. Social History  Substance Use Topics  . Smoking status: Current Every Day Smoker -- 1.50 packs/day for 60 years    Types: Cigarettes  . Smokeless tobacco: Never Used  . Alcohol Use: No   OB History    No data available     Review of Systems  Cardiovascular: Negative for chest pain.  Neurological: Negative for headaches.  All other systems reviewed and are  negative.     Allergies  Morphine and related  Home Medications   Prior to Admission medications   Medication Sig Start Date End Date Taking? Authorizing Provider  aspirin EC 81 MG tablet Take 81 mg by mouth at bedtime.    Historical Provider, MD  atorvastatin (LIPITOR) 20 MG tablet Take 20 mg by mouth daily.    Historical Provider, MD  atorvastatin (LIPITOR) 20 MG tablet TAKE ONE TABLET BY MOUTH ONCE DAILY IN THE MORNING 10/24/15   Marletta Lor, MD  Blood Glucose Monitoring Suppl (ONE TOUCH ULTRA SYSTEM KIT) W/DEVICE KIT USE TO CHECK BLOOD SUGAR DAILY AND RPN 01/10/15   Marletta Lor, MD  hydrochlorothiazide (HYDRODIURIL) 12.5 MG tablet Take 1 tablet (12.5 mg total) by mouth daily. 04/27/15   Marletta Lor, MD  ibuprofen (ADVIL,MOTRIN) 200 MG tablet Take 1 tablet (200 mg total) by mouth every 6 (six) hours as needed for mild pain or moderate pain. 04/27/15   Marletta Lor, MD  Lancets [facility] ULTRASOFT) lancets Once daily or PRN, Dx E11.9 04/14/15   Marletta Lor, MD  lisinopril (PRINIVIL,ZESTRIL) 20 MG tablet TAKE ONE TABLET BY MOUTH TWICE DAILY 09/07/15   Marletta Lor, MD  metFORMIN (GLUCOPHAGE) 500 MG tablet TAKE ONE TABLET BY MOUTH TWICE DAILY WITH FOOD 10/25/14   Marletta Lor, MD  Multiple Vitamins-Minerals (WOMENS 50+ MULTI VITAMIN/MIN PO) Take 1 tablet by mouth every morning.     Historical Provider, MD  ONE TOUCH ULTRA TEST test strip  USE TO TEST BLOOD SUGAR DAILY AS DIRECTED 10/25/14   Marletta Lor, MD  [facility] LANCETS 16D MISC Use as directed 04/21/15   Marletta Lor, MD  tetrahydrozoline-zinc (VISINE-AC) 0.05-0.25 % ophthalmic solution Place 1 drop into both eyes daily as needed (irritation).     Historical Provider, MD   BP 189/59 mmHg  Pulse 63  Temp(Src) 97.4 F (36.3 C) (Oral)  Resp 18  SpO2 95% Physical Exam  Constitutional: She is oriented to person, place, and time. She appears well-developed and  well-nourished.  Non-toxic appearance. No distress.  HENT:  Head: Normocephalic and atraumatic.  Eyes: Conjunctivae, EOM and lids are normal. Pupils are equal, round, and reactive to light.  Neck: Normal range of motion. Neck supple. No tracheal deviation present. No thyroid mass present.  Cardiovascular: Normal rate, regular rhythm and normal heart sounds.  Exam reveals no gallop.   No murmur heard. Pulmonary/Chest: Effort normal and breath sounds normal. No stridor. No respiratory distress. She has no decreased breath sounds. She has no wheezes. She has no rhonchi. She has no rales.  Abdominal: Soft. Normal appearance and bowel sounds are normal. She exhibits no distension. There is no tenderness. There is no rebound and no CVA tenderness.  Musculoskeletal: Normal range of motion. She exhibits no edema or tenderness.  Neurological: She is alert and oriented to person, place, and time. She has normal strength. No cranial nerve deficit or sensory deficit. GCS eye subscore is 4. GCS verbal subscore is 5. GCS motor subscore is 6.  Skin: Skin is warm and dry. No abrasion and no rash noted.  Psychiatric: She has a normal mood and affect. Her speech is normal and behavior is normal.  Nursing note and vitals reviewed.   ED Course  Procedures (including critical care time) Labs Review Labs Reviewed  BASIC METABOLIC PANEL    Imaging Review No results found. I have personally reviewed and evaluated these images and lab results as part of my medical decision-making.   EKG Interpretation None      MDM   Final diagnoses:  None    Patient given, Catapres given and blood pressures improved. Encouraged to follow-up with her Dr.    Lacretia Leigh, MD 11/10/15 1331

## 2015-11-10 NOTE — ED Notes (Signed)
Per pt, states high BP since last night-states in the 200's-taking meds as prescribed

## 2015-11-10 NOTE — ED Notes (Signed)
MD at bedside. 

## 2015-11-10 NOTE — Telephone Encounter (Signed)
Needs rov 

## 2015-11-10 NOTE — Discharge Instructions (Signed)
Hypertension Hypertension, commonly called high blood pressure, is when the force of blood pumping through your arteries is too strong. Your arteries are the blood vessels that carry blood from your heart throughout your body. A blood pressure reading consists of a higher number over a lower number, such as 110/72. The higher number (systolic) is the pressure inside your arteries when your heart pumps. The lower number (diastolic) is the pressure inside your arteries when your heart relaxes. Ideally you want your blood pressure below 120/80. Hypertension forces your heart to work harder to pump blood. Your arteries may become narrow or stiff. Having untreated or uncontrolled hypertension can cause heart attack, stroke, kidney disease, and other problems. RISK FACTORS Some risk factors for high blood pressure are controllable. Others are not.  Risk factors you cannot control include:   Race. You may be at higher risk if you are African American.  Age. Risk increases with age.  Gender. Men are at higher risk than women before age 45 years. After age 65, women are at higher risk than men. Risk factors you can control include:  Not getting enough exercise or physical activity.  Being overweight.  Getting too much fat, sugar, calories, or salt in your diet.  Drinking too much alcohol. SIGNS AND SYMPTOMS Hypertension does not usually cause signs or symptoms. Extremely high blood pressure (hypertensive crisis) may cause headache, anxiety, shortness of breath, and nosebleed. DIAGNOSIS To check if you have hypertension, your health care provider will measure your blood pressure while you are seated, with your arm held at the level of your heart. It should be measured at least twice using the same arm. Certain conditions can cause a difference in blood pressure between your right and left arms. A blood pressure reading that is higher than normal on one occasion does not mean that you need treatment. If  it is not clear whether you have high blood pressure, you may be asked to return on a different day to have your blood pressure checked again. Or, you may be asked to monitor your blood pressure at home for 1 or more weeks. TREATMENT Treating high blood pressure includes making lifestyle changes and possibly taking medicine. Living a healthy lifestyle can help lower high blood pressure. You may need to change some of your habits. Lifestyle changes may include:  Following the DASH diet. This diet is high in fruits, vegetables, and whole grains. It is low in salt, red meat, and added sugars.  Keep your sodium intake below 2,300 mg per day.  Getting at least 30-45 minutes of aerobic exercise at least 4 times per week.  Losing weight if necessary.  Not smoking.  Limiting alcoholic beverages.  Learning ways to reduce stress. Your health care provider may prescribe medicine if lifestyle changes are not enough to get your blood pressure under control, and if one of the following is true:  You are 18-59 years of age and your systolic blood pressure is above 140.  You are 60 years of age or older, and your systolic blood pressure is above 150.  Your diastolic blood pressure is above 90.  You have diabetes, and your systolic blood pressure is over 140 or your diastolic blood pressure is over 90.  You have kidney disease and your blood pressure is above 140/90.  You have heart disease and your blood pressure is above 140/90. Your personal target blood pressure may vary depending on your medical conditions, your age, and other factors. HOME CARE INSTRUCTIONS    Have your blood pressure rechecked as directed by your health care provider.   Take medicines only as directed by your health care provider. Follow the directions carefully. Blood pressure medicines must be taken as prescribed. The medicine does not work as well when you skip doses. Skipping doses also puts you at risk for  problems.  Do not smoke.   Monitor your blood pressure at home as directed by your health care provider. SEEK MEDICAL CARE IF:   You think you are having a reaction to medicines taken.  You have recurrent headaches or feel dizzy.  You have swelling in your ankles.  You have trouble with your vision. SEEK IMMEDIATE MEDICAL CARE IF:  You develop a severe headache or confusion.  You have unusual weakness, numbness, or feel faint.  You have severe chest or abdominal pain.  You vomit repeatedly.  You have trouble breathing. MAKE SURE YOU:   Understand these instructions.  Will watch your condition.  Will get help right away if you are not doing well or get worse.   This information is not intended to replace advice given to you by your health care provider. Make sure you discuss any questions you have with your health care provider.   Document Released: 11/11/2005 Document Revised: 03/28/2015 Document Reviewed: 09/03/2013 Elsevier Interactive Patient Education 2016 [employer] Inc[employer]  

## 2015-11-10 NOTE — Telephone Encounter (Signed)
Pt called saying she went to the ER this morning due to her BP being 200/ and was there for 5hrs. They got her BP to come down to 130/ per the pt. She was told her BP medication needed to be increased. [facility] [address]. Please give her a call regarding this and let her know if she needs to come in.  Pt's ph# [phone] Thank you.

## 2015-11-10 NOTE — Telephone Encounter (Signed)
Please call in a new prescription for Amlodipine 2.5 milligrams, #90 one daily.  Please notify patient and schedule office visit next week

## 2015-11-13 MED ORDER — AMLODIPINE BESYLATE 2.5 MG PO TABS: 2.5000 mg | ORAL_TABLET | Freq: Every day | ORAL | Status: DC

## 2015-11-13 NOTE — Telephone Encounter (Signed)
Patient checked into ED on 12/16

## 2015-11-13 NOTE — Telephone Encounter (Signed)
Rx was sent and pt is aware

## 2015-11-17 ENCOUNTER — Encounter: Payer: Self-pay | Admitting: Internal Medicine

## 2015-11-17 ENCOUNTER — Ambulatory Visit (INDEPENDENT_AMBULATORY_CARE_PROVIDER_SITE_OTHER): Payer: Medicare Other | Admitting: Internal Medicine

## 2015-11-17 VITALS — BP 180/76 | HR 88 | Temp 98.5°F | Resp 20 | Ht 60.0 in | Wt 105.0 lb

## 2015-11-17 DIAGNOSIS — I6523 Occlusion and stenosis of bilateral carotid arteries: Secondary | ICD-10-CM | POA: Diagnosis not present

## 2015-11-17 DIAGNOSIS — Z23 Encounter for immunization: Secondary | ICD-10-CM

## 2015-11-17 DIAGNOSIS — I10 Essential (primary) hypertension: Secondary | ICD-10-CM | POA: Diagnosis not present

## 2015-11-17 MED ORDER — LISINOPRIL 10 MG PO TABS: 10.0000 mg | ORAL_TABLET | Freq: Two times a day (BID) | ORAL | Status: DC

## 2015-11-17 MED ORDER — LISINOPRIL 20 MG PO TABS: 20.0000 mg | ORAL_TABLET | Freq: Two times a day (BID) | ORAL | Status: DC

## 2015-11-17 NOTE — Patient Instructions (Signed)
Limit your sodium (Salt) intake  Please check your blood pressure on a regular basis.  If it is consistently greater than 150/90, please make an office appointment.  Return in 3 months for follow-up  

## 2015-11-17 NOTE — Progress Notes (Signed)
Pre visit review using our clinic review tool, if applicable. No additional management support is needed unless otherwise documented below in the visit note. 

## 2015-11-17 NOTE — Progress Notes (Signed)
Subjective:    Patient ID: [name], female    DOB: [birth year], 81 y.o.   MRN: [id number]  HPI  81 year old patient seen today for follow and a ED visit due to poorly controlled hypertension. Amlodipine 2.5 milligrams has been added to her regimen.  She is tolerating this quite well.  She also has a history of Raynaud syndrome.  She continues to monitor home blood pressure readings with occasional high systolic readings.  She has only been on amlodipine for approximately 3 days  Past Medical History  Diagnosis Date  . Diabetes mellitus, type 2 ([place])   . Hyperlipidemia   . Hypertension   . History of cervical cancer   . TN (trigeminal neuralgia)   . Raynaud's phenomenon     Social History   Social History  . Marital Status: Married    Spouse Name: [name]  . Number of Children: 1  . Years of Education: N/A   Occupational History  . Unemployed    Social History Main Topics  . Smoking status: Current Every Day Smoker -- 1.50 packs/day for 60 years    Types: Cigarettes  . Smokeless tobacco: Never Used  . Alcohol Use: No  . Drug Use: No  . Sexual Activity: Not on file   Other Topics Concern  . Not on file   Social History Narrative   Married.  Lives in [place] with her husband.  No FH of heart disease.    Past Surgical History  Procedure Laterality Date  . Breast biopsy    . Breast enhancement surgery    . Total abdominal hysterectomy  1963    for cervical cancer in situ  . Cholecystectomy N/A 02/14/2014    Procedure: LAPAROSCOPIC CHOLECYSTECTOMY;  Surgeon: Ralene Ok, MD;  Location: [place];  Service: General;  Laterality: N/A;  . Ercp N/A 03/08/2015    Procedure: ENDOSCOPIC RETROGRADE CHOLANGIOPANCREATOGRAPHY (ERCP);  Surgeon: Ladene Artist, MD;  Location: [facility];  Service: Endoscopy;  Laterality: N/A;    No family history on file.  Allergies  Allergen Reactions  . Morphine And Related Itching    Short lived, mild itching.     Current Outpatient Prescriptions on File Prior to Visit  Medication Sig Dispense Refill  . amLODipine (NORVASC) 2.5 MG tablet Take 1 tablet (2.5 mg total) by mouth daily. 90 tablet 0  . aspirin EC 81 MG tablet Take 81 mg by mouth at bedtime.    [name] atorvastatin (LIPITOR) 20 MG tablet TAKE ONE TABLET BY MOUTH ONCE DAILY IN THE MORNING 90 tablet 1  . Blood Glucose Monitoring Suppl (ONE TOUCH ULTRA SYSTEM KIT) W/DEVICE KIT USE TO CHECK BLOOD SUGAR DAILY AND RPN 1 each 0  . hydrochlorothiazide (HYDRODIURIL) 12.5 MG tablet Take 1 tablet (12.5 mg total) by mouth daily. 90 tablet 1  . ibuprofen (ADVIL,MOTRIN) 200 MG tablet Take 1 tablet (200 mg total) by mouth every 6 (six) hours as needed for mild pain or moderate pain. 30 tablet 5  . Lancets (ONETOUCH ULTRASOFT) lancets Once daily or PRN, Dx E11.9 100 each 12  . metFORMIN (GLUCOPHAGE) 500 MG tablet TAKE ONE TABLET BY MOUTH TWICE DAILY WITH FOOD 180 tablet 1  . Multiple Vitamins-Minerals (WOMENS 50+ MULTI VITAMIN/MIN PO) Take 1 tablet by mouth every morning.     . ONE TOUCH ULTRA TEST test strip USE TO TEST BLOOD SUGAR DAILY AS DIRECTED 100 each 12  . ONETOUCH DELICA LANCETS 03U MISC Use as directed 11 each 11  .  Prasterone, DHEA, (DHEA 50) 50 MG CAPS Take 50 mg by mouth daily.    [name] tetrahydrozoline-zinc (VISINE-AC) 0.05-0.25 % ophthalmic solution Place 1 drop into both eyes daily as needed (irritation).     . TURMERIC CURCUMIN PO Take 1 capsule by mouth daily.     No current facility-administered medications on file prior to visit.    BP 180/76 mmHg  Pulse 88  Temp(Src) 98.5 F (36.9 C) (Oral)  Resp 20  Ht 5' (1.524 m)  Wt 105 lb (47.628 kg)  BMI 20.51 kg/m2     Review of Systems  Constitutional: Negative.   HENT: Negative for congestion, dental problem, hearing loss, rhinorrhea, sinus pressure, sore throat and tinnitus.   Eyes: Negative for pain, discharge and visual disturbance.  Respiratory: Negative for cough and shortness of  breath.   Cardiovascular: Negative for chest pain, palpitations and leg swelling.  Gastrointestinal: Negative for nausea, vomiting, abdominal pain, diarrhea, constipation, blood in stool and abdominal distention.  Genitourinary: Negative for dysuria, urgency, frequency, hematuria, flank pain, vaginal bleeding, vaginal discharge, difficulty urinating, vaginal pain and pelvic pain.  Musculoskeletal: Negative for joint swelling, arthralgias and gait problem.  Skin: Negative for rash.  Neurological: Negative for dizziness, syncope, speech difficulty, weakness, numbness and headaches.  Hematological: Negative for adenopathy.  Psychiatric/Behavioral: Negative for behavioral problems, dysphoric mood and agitation. The patient is not nervous/anxious.        Objective:   Physical Exam  Constitutional: She is oriented to person, place, and time. She appears well-developed and well-nourished.  HENT:  Head: Normocephalic.  Right Ear: External ear normal.  Left Ear: External ear normal.  Mouth/Throat: Oropharynx is clear and moist.  Eyes: Conjunctivae and EOM are normal. Pupils are equal, round, and reactive to light.  Neck: Normal range of motion. Neck supple. No thyromegaly present.  Cardiovascular: Normal rate, regular rhythm, normal heart sounds and intact distal pulses.   Pulmonary/Chest: Effort normal and breath sounds normal.  Abdominal: Soft. Bowel sounds are normal. She exhibits no mass. There is no tenderness.  Musculoskeletal: Normal range of motion.  Lymphadenopathy:    She has no cervical adenopathy.  Neurological: She is alert and oriented to person, place, and time.  Skin: Skin is warm and dry. No rash noted.  Psychiatric: She has a normal mood and affect. Her behavior is normal.          Assessment & Plan:   Hypertension.  Reasonable control.  Repeat blood pressure 140/70.  Will continue home monitoring.  We'll continue triple therapy.  Recheck 3 months

## 2015-11-29 DIAGNOSIS — H2513 Age-related nuclear cataract, bilateral: Secondary | ICD-10-CM | POA: Diagnosis not present

## 2015-11-29 DIAGNOSIS — H2512 Age-related nuclear cataract, left eye: Secondary | ICD-10-CM | POA: Diagnosis not present

## 2016-01-02 DIAGNOSIS — H2513 Age-related nuclear cataract, bilateral: Secondary | ICD-10-CM | POA: Diagnosis not present

## 2016-01-02 DIAGNOSIS — H2512 Age-related nuclear cataract, left eye: Secondary | ICD-10-CM | POA: Diagnosis not present

## 2016-01-02 DIAGNOSIS — Z961 Presence of intraocular lens: Secondary | ICD-10-CM | POA: Diagnosis not present

## 2016-01-03 DIAGNOSIS — H2511 Age-related nuclear cataract, right eye: Secondary | ICD-10-CM | POA: Diagnosis not present

## 2016-01-09 DIAGNOSIS — H2513 Age-related nuclear cataract, bilateral: Secondary | ICD-10-CM | POA: Diagnosis not present

## 2016-01-09 DIAGNOSIS — Z961 Presence of intraocular lens: Secondary | ICD-10-CM | POA: Diagnosis not present

## 2016-01-09 DIAGNOSIS — H2511 Age-related nuclear cataract, right eye: Secondary | ICD-10-CM | POA: Diagnosis not present

## 2016-01-23 ENCOUNTER — Encounter: Payer: Self-pay | Admitting: Internal Medicine

## 2016-01-23 ENCOUNTER — Ambulatory Visit (INDEPENDENT_AMBULATORY_CARE_PROVIDER_SITE_OTHER): Payer: Medicare Other | Admitting: Internal Medicine

## 2016-01-23 VITALS — BP 128/80 | HR 76 | Temp 98.0°F | Resp 18 | Ht 60.0 in | Wt 102.0 lb

## 2016-01-23 DIAGNOSIS — E119 Type 2 diabetes mellitus without complications: Secondary | ICD-10-CM

## 2016-01-23 DIAGNOSIS — I10 Essential (primary) hypertension: Secondary | ICD-10-CM | POA: Diagnosis not present

## 2016-01-23 NOTE — Progress Notes (Signed)
Subjective:    Patient ID: [name], female    DOB: Nov 28, 1933, 82 y.o.   MRN: [id number]  HPI 82 year old patient who has diabetes and essential hypertension.  She has done quite well over the past few months.  Has had some mild raynaud,'s symptoms throughout the winter upon cold exposure  Lab Results  Component Value Date   HGBA1C 6.5 07/24/2015    BP Readings from Last 3 Encounters:  01/23/16 128/80  11/17/15 180/76  11/10/15 108/60    Past Medical History  Diagnosis Date  . Diabetes mellitus, type 2 ([place])   . Hyperlipidemia   . Hypertension   . History of cervical cancer   . TN (trigeminal neuralgia)   . Raynaud's phenomenon     Social History   Social History  . Marital Status: Married    Spouse Name: [name]  . Number of Children: 1  . Years of Education: N/A   Occupational History  . Unemployed    Social History Main Topics  . Smoking status: Current Every Day Smoker -- 1.50 packs/day for 60 years    Types: Cigarettes  . Smokeless tobacco: Never Used  . Alcohol Use: No  . Drug Use: No  . Sexual Activity: Not on file   Other Topics Concern  . Not on file   Social History Narrative   Married.  Lives in [place] with her husband.  No FH of heart disease.    Past Surgical History  Procedure Laterality Date  . Breast biopsy    . Breast enhancement surgery    . Total abdominal hysterectomy  1963    for cervical cancer in situ  . Cholecystectomy N/A 02/14/2014    Procedure: LAPAROSCOPIC CHOLECYSTECTOMY;  Surgeon: Ralene Ok, MD;  Location: [facility];  Service: General;  Laterality: N/A;  . Ercp N/A 03/08/2015    Procedure: ENDOSCOPIC RETROGRADE CHOLANGIOPANCREATOGRAPHY (ERCP);  Surgeon: Ladene Artist, MD;  Location: [facility];  Service: Endoscopy;  Laterality: N/A;    No family history on file.  Allergies  Allergen Reactions  . Morphine And Related Itching    Short lived, mild itching.    Current Outpatient Prescriptions on  File Prior to Visit  Medication Sig Dispense Refill  . amLODipine (NORVASC) 2.5 MG tablet Take 1 tablet (2.5 mg total) by mouth daily. 90 tablet 0  . aspirin EC 81 MG tablet Take 81 mg by mouth at bedtime.    [name] atorvastatin (LIPITOR) 20 MG tablet TAKE ONE TABLET BY MOUTH ONCE DAILY IN THE MORNING 90 tablet 1  . Blood Glucose Monitoring Suppl (ONE TOUCH ULTRA SYSTEM KIT) W/DEVICE KIT USE TO CHECK BLOOD SUGAR DAILY AND RPN 1 each 0  . hydrochlorothiazide (HYDRODIURIL) 12.5 MG tablet Take 1 tablet (12.5 mg total) by mouth daily. 90 tablet 1  . ibuprofen (ADVIL,MOTRIN) 200 MG tablet Take 1 tablet (200 mg total) by mouth every 6 (six) hours as needed for mild pain or moderate pain. 30 tablet 5  . Lancets (ONETOUCH ULTRASOFT) lancets Once daily or PRN, Dx E11.9 100 each 12  . lisinopril (PRINIVIL,ZESTRIL) 10 MG tablet Take 1 tablet (10 mg total) by mouth 2 (two) times daily. 180 tablet 3  . metFORMIN (GLUCOPHAGE) 500 MG tablet TAKE ONE TABLET BY MOUTH TWICE DAILY WITH FOOD 180 tablet 1  . Multiple Vitamins-Minerals (WOMENS 50+ MULTI VITAMIN/MIN PO) Take 1 tablet by mouth every morning.     . ONE TOUCH ULTRA TEST test strip USE TO TEST BLOOD SUGAR DAILY  AS DIRECTED 100 each 12  . ONETOUCH DELICA LANCETS 70J MISC Use as directed 11 each 11  . Prasterone, DHEA, (DHEA 50) 50 MG CAPS Take 50 mg by mouth daily.    [name] tetrahydrozoline-zinc (VISINE-AC) 0.05-0.25 % ophthalmic solution Place 1 drop into both eyes daily as needed (irritation).     . TURMERIC CURCUMIN PO Take 1 capsule by mouth daily.     No current facility-administered medications on file prior to visit.    BP 128/80 mmHg  Pulse 76  Temp(Src) 98 F (36.7 C) (Oral)  Resp 18  Ht 5' (1.524 m)  Wt 102 lb (46.267 kg)  BMI 19.92 kg/m2      Review of Systems  Constitutional: Negative.   HENT: Negative for congestion, dental problem, hearing loss, rhinorrhea, sinus pressure, sore throat and tinnitus.   Eyes: Negative for pain, discharge  and visual disturbance.  Respiratory: Negative for cough and shortness of breath.   Cardiovascular: Negative for chest pain, palpitations and leg swelling.  Gastrointestinal: Negative for nausea, vomiting, abdominal pain, diarrhea, constipation, blood in stool and abdominal distention.  Genitourinary: Negative for dysuria, urgency, frequency, hematuria, flank pain, vaginal bleeding, vaginal discharge, difficulty urinating, vaginal pain and pelvic pain.  Musculoskeletal: Negative for joint swelling, arthralgias and gait problem.  Skin: Negative for rash.  Neurological: Negative for dizziness, syncope, speech difficulty, weakness, numbness and headaches.  Hematological: Negative for adenopathy.  Psychiatric/Behavioral: Negative for behavioral problems, dysphoric mood and agitation. The patient is not nervous/anxious.        Objective:   Physical Exam  Constitutional: She is oriented to person, place, and time. She appears well-developed and well-nourished.  Blood pressure 130/80  HENT:  Head: Normocephalic.  Right Ear: External ear normal.  Left Ear: External ear normal.  Mouth/Throat: Oropharynx is clear and moist.  Eyes: Conjunctivae and EOM are normal. Pupils are equal, round, and reactive to light.  Neck: Normal range of motion. Neck supple. No thyromegaly present.  Cardiovascular: Normal rate, regular rhythm, normal heart sounds and intact distal pulses.   Pulmonary/Chest: Effort normal and breath sounds normal.  Abdominal: Soft. Bowel sounds are normal. She exhibits no mass. There is no tenderness.  Musculoskeletal: Normal range of motion.  Lymphadenopathy:    She has no cervical adenopathy.  Neurological: She is alert and oriented to person, place, and time.  Skin: Skin is warm and dry. No rash noted.  Psychiatric: She has a normal mood and affect. Her behavior is normal.          Assessment & Plan:   Diabetes mellitus.  Appears to be well controlled.  We'll check a  hemoglobin A1c.  Continue home blood sugar monitoring Hypertension, well-controlled.  No change in therapy History of Raynaud's- stable

## 2016-01-23 NOTE — Patient Instructions (Signed)
Limit your sodium (Salt) intake   Please check your hemoglobin A1c every 6  months

## 2016-01-23 NOTE — Progress Notes (Signed)
Pre visit review using our clinic review tool, if applicable. No additional management support is needed unless otherwise documented below in the visit note. 

## 2016-01-26 DIAGNOSIS — H43393 Other vitreous opacities, bilateral: Secondary | ICD-10-CM | POA: Diagnosis not present

## 2016-02-02 ENCOUNTER — Other Ambulatory Visit: Payer: Self-pay | Admitting: Internal Medicine

## 2016-03-07 ENCOUNTER — Other Ambulatory Visit: Payer: Self-pay | Admitting: Internal Medicine

## 2016-03-07 DIAGNOSIS — I6523 Occlusion and stenosis of bilateral carotid arteries: Secondary | ICD-10-CM

## 2016-03-11 ENCOUNTER — Ambulatory Visit (HOSPITAL_COMMUNITY)
Admission: RE | Admit: 2016-03-11 | Discharge: 2016-03-11 | Disposition: A | Discharge status: Home / Self Care | Payer: Medicare Other | Source: Ambulatory Visit | Attending: Cardiology | Admitting: Cardiology

## 2016-03-11 DIAGNOSIS — I6523 Occlusion and stenosis of bilateral carotid arteries: Secondary | ICD-10-CM

## 2016-03-11 DIAGNOSIS — E119 Type 2 diabetes mellitus without complications: Secondary | ICD-10-CM | POA: Diagnosis not present

## 2016-03-11 DIAGNOSIS — E785 Hyperlipidemia, unspecified: Secondary | ICD-10-CM | POA: Insufficient documentation

## 2016-03-11 DIAGNOSIS — I10 Essential (primary) hypertension: Secondary | ICD-10-CM | POA: Diagnosis not present

## 2016-03-18 ENCOUNTER — Encounter: Payer: Self-pay | Admitting: Internal Medicine

## 2016-03-18 ENCOUNTER — Ambulatory Visit (INDEPENDENT_AMBULATORY_CARE_PROVIDER_SITE_OTHER): Payer: Medicare Other | Admitting: Internal Medicine

## 2016-03-18 VITALS — BP 140/80 | HR 84 | Temp 97.8°F | Resp 20 | Ht 60.0 in | Wt 102.0 lb

## 2016-03-18 DIAGNOSIS — I10 Essential (primary) hypertension: Secondary | ICD-10-CM

## 2016-03-18 DIAGNOSIS — R945 Abnormal results of liver function studies: Secondary | ICD-10-CM

## 2016-03-18 DIAGNOSIS — E785 Hyperlipidemia, unspecified: Secondary | ICD-10-CM

## 2016-03-18 DIAGNOSIS — R1013 Epigastric pain: Secondary | ICD-10-CM | POA: Diagnosis not present

## 2016-03-18 DIAGNOSIS — I7300 Raynaud's syndrome without gangrene: Secondary | ICD-10-CM

## 2016-03-18 DIAGNOSIS — E119 Type 2 diabetes mellitus without complications: Secondary | ICD-10-CM | POA: Diagnosis not present

## 2016-03-18 DIAGNOSIS — R7989 Other specified abnormal findings of blood chemistry: Secondary | ICD-10-CM

## 2016-03-18 MED ORDER — OMEPRAZOLE 20 MG PO CPDR: 20.0000 mg | DELAYED_RELEASE_CAPSULE | Freq: Every day | ORAL | Status: DC

## 2016-03-18 NOTE — Progress Notes (Signed)
Pre visit review using our clinic review tool, if applicable. No additional management support is needed unless otherwise documented below in the visit note. 

## 2016-03-18 NOTE — Progress Notes (Signed)
Subjective:    Patient ID: [name], female    DOB: [birth year], 82 y.o.   MRN: [id number]  HPI  82 year old patient who is seen today for follow-up of type 2 diabetes.  She has essential hypertension.  She has a history of pancreatitis and elevated LFTs secondary to a mild distal common bile duct stricture.  Complaints today include some epigastric discomfort and occasional burning and belching In general seems to be doing well  Lab Results  Component Value Date   HGBA1C 6.5 07/24/2015    Wt Readings from Last 3 Encounters:  03/18/16 102 lb (46.267 kg)  01/23/16 102 lb (46.267 kg)  11/17/15 105 lb (47.628 kg)    Past Medical History  Diagnosis Date  . Diabetes mellitus, type 2 ([place])   . Hyperlipidemia   . Hypertension   . History of cervical cancer   . TN (trigeminal neuralgia)   . Raynaud's phenomenon      Social History   Social History  . Marital Status: Married    Spouse Name: [name]  . Number of Children: 1  . Years of Education: N/A   Occupational History  . Unemployed    Social History Main Topics  . Smoking status: Current Every Day Smoker -- 1.50 packs/day for 60 years    Types: Cigarettes  . Smokeless tobacco: Never Used  . Alcohol Use: No  . Drug Use: No  . Sexual Activity: Not on file   Other Topics Concern  . Not on file   Social History Narrative   Married.  Lives in [place] with her husband.  No FH of heart disease.    Past Surgical History  Procedure Laterality Date  . Breast biopsy    . Breast enhancement surgery    . Total abdominal hysterectomy  1963    for cervical cancer in situ  . Cholecystectomy N/A 02/14/2014    Procedure: LAPAROSCOPIC CHOLECYSTECTOMY;  Surgeon: Ralene Ok, MD;  Location: [facility];  Service: General;  Laterality: N/A;  . Ercp N/A 03/08/2015    Procedure: ENDOSCOPIC RETROGRADE CHOLANGIOPANCREATOGRAPHY (ERCP);  Surgeon: Ladene Artist, MD;  Location: [facility];  Service: Endoscopy;   Laterality: N/A;    No family history on file.  Allergies  Allergen Reactions  . Morphine And Related Itching    Short lived, mild itching.    Current Outpatient Prescriptions on File Prior to Visit  Medication Sig Dispense Refill  . amLODipine (NORVASC) 2.5 MG tablet TAKE ONE TABLET BY MOUTH ONCE DAILY 90 tablet 1  . aspirin EC 81 MG tablet Take 81 mg by mouth at bedtime.    [name] atorvastatin (LIPITOR) 20 MG tablet TAKE ONE TABLET BY MOUTH ONCE DAILY IN THE MORNING 90 tablet 1  . Blood Glucose Monitoring Suppl (ONE TOUCH ULTRA SYSTEM KIT) W/DEVICE KIT USE TO CHECK BLOOD SUGAR DAILY AND RPN 1 each 0  . hydrochlorothiazide (HYDRODIURIL) 12.5 MG tablet Take 1 tablet (12.5 mg total) by mouth daily. 90 tablet 1  . ibuprofen (ADVIL,MOTRIN) 200 MG tablet Take 1 tablet (200 mg total) by mouth every 6 (six) hours as needed for mild pain or moderate pain. 30 tablet 5  . Lancets (ONETOUCH ULTRASOFT) lancets Once daily or PRN, Dx E11.9 100 each 12  . metFORMIN (GLUCOPHAGE) 500 MG tablet TAKE ONE TABLET BY MOUTH TWICE DAILY WITH FOOD 180 tablet 1  . Multiple Vitamins-Minerals (WOMENS 50+ MULTI VITAMIN/MIN PO) Take 1 tablet by mouth every morning.     . ONE TOUCH  ULTRA TEST test strip USE TO TEST BLOOD SUGAR DAILY AS DIRECTED 100 each 12  . ONETOUCH DELICA LANCETS 70Y MISC Use as directed 11 each 11  . tetrahydrozoline-zinc (VISINE-AC) 0.05-0.25 % ophthalmic solution Place 1 drop into both eyes daily as needed (irritation).     . TURMERIC CURCUMIN PO Take 1 capsule by mouth daily.     No current facility-administered medications on file prior to visit.    BP 140/80 mmHg  Pulse 84  Temp(Src) 97.8 F (36.6 C) (Oral)  Resp 20  Ht 5' (1.524 m)  Wt 102 lb (46.267 kg)  BMI 19.92 kg/m2     Review of Systems  Constitutional: Negative.   HENT: Negative for congestion, dental problem, hearing loss, rhinorrhea, sinus pressure, sore throat and tinnitus.   Eyes: Negative for pain, discharge and  visual disturbance.  Respiratory: Negative for cough and shortness of breath.   Cardiovascular: Negative for chest pain, palpitations and leg swelling.  Gastrointestinal: Positive for abdominal pain. Negative for nausea, vomiting, diarrhea, constipation, blood in stool and abdominal distention.  Genitourinary: Negative for dysuria, urgency, frequency, hematuria, flank pain, vaginal bleeding, vaginal discharge, difficulty urinating, vaginal pain and pelvic pain.  Musculoskeletal: Negative for joint swelling, arthralgias and gait problem.  Skin: Negative for rash.  Neurological: Negative for dizziness, syncope, speech difficulty, weakness, numbness and headaches.  Hematological: Negative for adenopathy.  Psychiatric/Behavioral: Negative for behavioral problems, dysphoric mood and agitation. The patient is not nervous/anxious.        Objective:   Physical Exam  Constitutional: She is oriented to person, place, and time. She appears well-developed and well-nourished.  HENT:  Head: Normocephalic.  Right Ear: External ear normal.  Left Ear: External ear normal.  Mouth/Throat: Oropharynx is clear and moist.  Eyes: Conjunctivae and EOM are normal. Pupils are equal, round, and reactive to light.  Neck: Normal range of motion. Neck supple. No thyromegaly present.  Cardiovascular: Normal rate, regular rhythm, normal heart sounds and intact distal pulses.   Pulmonary/Chest: Effort normal and breath sounds normal. No respiratory distress. She has no wheezes.  Abdominal: Soft. Bowel sounds are normal. She exhibits no mass. There is no tenderness. There is no rebound and no guarding.  Musculoskeletal: Normal range of motion.  Lymphadenopathy:    She has no cervical adenopathy.  Neurological: She is alert and oriented to person, place, and time.  Skin: Skin is warm and dry. No rash noted.  Psychiatric: She has a normal mood and affect. Her behavior is normal.          Assessment & Plan:    Diabetes mellitus type 2.  Will check a hemoglobin A1c Essential hypertension, stable Abdominal pain.  Nonspecific will treat with 30 days of PPI therapy History of Raynaud not Dyslipidemia  Recheck 6 months

## 2016-03-18 NOTE — Patient Instructions (Signed)
Avoids foods high in acid such as tomatoes citrus juices, and spicy foods.  Avoid eating within two hours of lying down or before exercising.  Do not overheat.  Try smaller more frequent meals.   .  Omeprazole 30 mg daily for 30 days   Please check your hemoglobin A1c every 3-6  months  \ Limit your sodium (Salt) intake  Please check your blood pressure on a regular basis.  If it is consistently greater than 150/90, please make an office appointment.

## 2016-03-19 LAB — COMPREHENSIVE METABOLIC PANEL
ALBUMIN: 4.2 g/dL (ref 3.5–5.2)
ALK PHOS: 102 U/L (ref 39–117)
ALT: 33 U/L (ref 0–35)
AST: 30 U/L (ref 0–37)
BUN: 15 mg/dL (ref 6–23)
CO2: 27 mEq/L (ref 19–32)
Calcium: 9.7 mg/dL (ref 8.4–10.5)
Chloride: 106 mEq/L (ref 96–112)
Creatinine, Ser: 1.06 mg/dL (ref 0.40–1.20)
GFR: 52.99 mL/min — AB (ref >60.00)
Glucose, Bld: 82 mg/dL (ref 70–99)
Potassium: 4 mEq/L (ref 3.5–5.1)
Sodium: 143 mEq/L (ref 135–145)
Total Bilirubin: 0.6 mg/dL (ref 0.2–1.2)
Total Protein: 6.8 g/dL (ref 6.0–8.3)

## 2016-03-19 LAB — TSH: TSH: 1.72 u[IU]/mL (ref 0.35–4.50)

## 2016-03-19 LAB — LIPID PANEL
CHOL/HDL RATIO: 3
CHOLESTEROL: 156 mg/dL (ref 0–200)
HDL: 53.9 mg/dL (ref >39.00)
LDL Cholesterol: 64 mg/dL (ref 0–99)
NonHDL: 101.9
TRIGLYCERIDES: 190 mg/dL — AB (ref 0.0–149.0)
VLDL: 38 mg/dL (ref 0.0–40.0)

## 2016-03-19 LAB — CBC WITH DIFFERENTIAL/PLATELET
Basophils Absolute: 0 10*3/uL (ref 0.0–0.1)
Basophils Relative: 0.3 % (ref 0.0–3.0)
EOS ABS: 0.1 10*3/uL (ref 0.0–0.7)
Eosinophils Relative: 1.9 % (ref 0.0–5.0)
HEMATOCRIT: 45.5 % (ref 36.0–46.0)
HEMOGLOBIN: 15.3 g/dL — AB (ref 12.0–15.0)
LYMPHS PCT: 29.1 % (ref 12.0–46.0)
Lymphs Abs: 2.3 10*3/uL (ref 0.7–4.0)
MCHC: 33.7 g/dL (ref 30.0–36.0)
MCV: 94.3 fl (ref 78.0–100.0)
MONOS PCT: 4.9 % (ref 3.0–12.0)
Monocytes Absolute: 0.4 10*3/uL (ref 0.1–1.0)
NEUTROS ABS: 5 10*3/uL (ref 1.4–7.7)
Neutrophils Relative %: 63.8 % (ref 43.0–77.0)
PLATELETS: 241 10*3/uL (ref 150.0–400.0)
RBC: 4.82 Mil/uL (ref 3.87–5.11)
RDW: 14.7 % (ref 11.5–15.5)
WBC: 7.8 10*3/uL (ref 4.0–10.5)

## 2016-03-19 LAB — MICROALBUMIN / CREATININE URINE RATIO
CREATININE, U: 43.3 mg/dL
MICROALB/CREAT RATIO: 2.5 mg/g (ref 0.0–30.0)
Microalb, Ur: 1.1 mg/dL (ref 0.0–1.9)

## 2016-03-19 LAB — HEMOGLOBIN A1C: HEMOGLOBIN A1C: 6.9 % — AB (ref 4.6–6.5)

## 2016-03-27 DIAGNOSIS — H04123 Dry eye syndrome of bilateral lacrimal glands: Secondary | ICD-10-CM | POA: Diagnosis not present

## 2016-04-07 ENCOUNTER — Other Ambulatory Visit: Payer: Self-pay | Admitting: Internal Medicine

## 2016-04-16 ENCOUNTER — Other Ambulatory Visit: Payer: Self-pay | Admitting: Internal Medicine

## 2016-05-01 ENCOUNTER — Other Ambulatory Visit: Payer: Self-pay | Admitting: Internal Medicine

## 2016-06-26 ENCOUNTER — Other Ambulatory Visit: Payer: Self-pay | Admitting: Internal Medicine

## 2016-06-27 ENCOUNTER — Other Ambulatory Visit: Payer: Self-pay | Admitting: Emergency Medicine

## 2016-06-27 MED ORDER — LISINOPRIL 20 MG PO TABS: 20.0000 mg | ORAL_TABLET | Freq: Two times a day (BID) | ORAL | 0 refills | Status: DC

## 2016-07-15 ENCOUNTER — Encounter: Payer: Self-pay | Admitting: Gastroenterology

## 2016-07-22 ENCOUNTER — Encounter: Payer: Self-pay | Admitting: Internal Medicine

## 2016-07-22 ENCOUNTER — Ambulatory Visit (INDEPENDENT_AMBULATORY_CARE_PROVIDER_SITE_OTHER): Payer: Medicare Other | Admitting: Internal Medicine

## 2016-07-22 VITALS — BP 120/70 | HR 71 | Temp 97.7°F | Resp 18 | Ht <= 58 in | Wt 101.0 lb

## 2016-07-22 DIAGNOSIS — E785 Hyperlipidemia, unspecified: Secondary | ICD-10-CM

## 2016-07-22 DIAGNOSIS — E119 Type 2 diabetes mellitus without complications: Secondary | ICD-10-CM | POA: Diagnosis not present

## 2016-07-22 DIAGNOSIS — I10 Essential (primary) hypertension: Secondary | ICD-10-CM

## 2016-07-22 DIAGNOSIS — Z0000 Encounter for general adult medical examination without abnormal findings: Secondary | ICD-10-CM

## 2016-07-22 LAB — HEMOGLOBIN A1C: Hgb A1c MFr Bld: 6.6 % — ABNORMAL HIGH (ref 4.6–6.5)

## 2016-07-22 NOTE — Progress Notes (Signed)
Patient ID: [name], female   DOB: [birth year], 82 y.o.   MRN: [id number]  Subjective:    Patient ID: [name], female    DOB: Sep 09, 1934, 82 y.o.   MRN: [id number]  Diabetes  Pertinent negatives for hypoglycemia include no dizziness, headaches, nervousness/anxiousness or speech difficulty. Pertinent negatives for diabetes include no chest pain, no fatigue and no weakness.  82 year old patient who is seen today for a health maintenance exam.   Medical problems include carotid artery stenosis. Follow carotid artery Doppler study Was performed April 2017.  No focal neurological symptoms   Complaints today include low back and left leg pain, worse in the morning.  Nice relief with ibuprofen  Medicare wellness exam  1. Risk factors, based on past  M,S,F history- cardiovascular risk factors include hypertension dyslipidemia diabetes and ongoing tobacco use; she has peripheral vascular disease with left ICA stenosis  2.  Physical activities: Remains quite active goes to the health club frequently  3.  Depression/mood: No history depression or mood disorder  4.  Hearing: No deficits  5.  ADL's: Independent in all aspects of daily living 6.  Fall risk: Low  7.  Home safety: No problems identified  8.  Height weight, and visual acuity; height and weight stable no change in visual acuity  9.  Counseling: Heart healthy diet restricted salt diet also encouraged regular exercise recommended  10. Lab orders based on risk factors: Laboratory profile including lipid panel will be reviewed  11. Referral : None appropriate at this time  12. Care plan: Heart healthy diet regular exercise encouraged  13. Cognitive assessment: Alert and appropriate normal affect no cognitive dysfunction  14.  Preventive services will include annual clinical examinations as well as annual eye examinations.  Follow-up carotid artery Doppler study in one year  15.  Provider list  includes primary care ophthalmology and radiology  Past Medical History:  Diagnosis Date  . Diabetes mellitus, type 2 ([place])   . History of cervical cancer   . Hyperlipidemia   . Hypertension   . Raynaud's phenomenon   . TN (trigeminal neuralgia)    Past Surgical History:  Procedure Laterality Date  . BREAST BIOPSY    . BREAST ENHANCEMENT SURGERY    . CHOLECYSTECTOMY N/A 02/14/2014   Procedure: LAPAROSCOPIC CHOLECYSTECTOMY;  Surgeon: Ralene Ok, MD;  Location: [facility];  Service: General;  Laterality: N/A;  . ERCP N/A 03/08/2015   Procedure: ENDOSCOPIC RETROGRADE CHOLANGIOPANCREATOGRAPHY (ERCP);  Surgeon: Ladene Artist, MD;  Location: [facility];  Service: Endoscopy;  Laterality: N/A;  . TOTAL ABDOMINAL HYSTERECTOMY  1963   for cervical cancer in situ    reports that she has been smoking Cigarettes.  She has a 90.00 pack-year smoking history. She has never used smokeless tobacco. She reports that she does not drink alcohol or use drugs. family history is not on file. Allergies  Allergen Reactions  . Morphine And Related Itching    Short lived, mild itching.      Alcohol-Tobacco  Smoking Status: current   Allergies (verified):  No Known Drug Allergies   Past History:  Past Medical History:  Reviewed history from 11/30/2009 and no changes required.  Diabetes mellitus, type II  Hyperlipidemia  Hypertension  Cervical cancer, hx of  trigeminal neuralgia  Raynaud's phenomenon  Left carotid artery stenosis Tobacco abuse   Family History:  both parents died in their 64s natural causes, details unclear  6 brothers 6 sisters - ONLY ONE DECEASED AT 27 denies  any family history of diabetes, cancer or premature heart disease     Review of Systems  Constitutional: Negative for appetite change, fatigue, fever and unexpected weight change.  HENT: Negative for congestion, dental problem, ear pain, hearing loss, mouth sores, nosebleeds, sinus pressure, sore throat,  tinnitus, trouble swallowing and voice change.   Eyes: Negative for photophobia, pain, redness and visual disturbance.  Respiratory: Negative for cough, chest tightness and shortness of breath.   Cardiovascular: Negative for chest pain, palpitations and leg swelling.  Gastrointestinal: Negative for abdominal distention, abdominal pain, blood in stool, constipation, diarrhea, nausea, rectal pain and vomiting.  Genitourinary: Negative for difficulty urinating, dysuria, flank pain, frequency, genital sores, hematuria, menstrual problem, pelvic pain, urgency, vaginal bleeding, vaginal discharge and vaginal pain.  Musculoskeletal: Negative for arthralgias, back pain and neck stiffness.  Skin: Negative for rash.  Neurological: Positive for light-headedness. Negative for dizziness, syncope, speech difficulty, weakness, numbness and headaches.  Hematological: Negative for adenopathy. Does not bruise/bleed easily.  Psychiatric/Behavioral: Negative for agitation, behavioral problems, dysphoric mood, self-injury and suicidal ideas. The patient is not nervous/anxious.        Objective:   Physical Exam  Constitutional: She is oriented to person, place, and time. She appears well-developed and well-nourished.  HENT:  Head: Normocephalic and atraumatic.  Right Ear: External ear normal.  Left Ear: External ear normal.  Mouth/Throat: Oropharynx is clear and moist.  Eyes: Conjunctivae and EOM are normal.  Neck: Normal range of motion. Neck supple. No JVD present. No thyromegaly present.  Cardiovascular: Normal rate, regular rhythm and normal heart sounds.   No murmur heard. Pedal pulses present present  Pulmonary/Chest: Effort normal and breath sounds normal. She has no wheezes. She has no rales.  Status post breast implants  Abdominal: Soft. Bowel sounds are normal. She exhibits no distension and no mass. There is no tenderness. There is no rebound and no guarding.  Genitourinary:  Genitourinary  Comments: Declines  Musculoskeletal: Normal range of motion. She exhibits no edema or tenderness.  Full range of motion both hips  Neurological: She is alert and oriented to person, place, and time. She has normal reflexes. No cranial nerve deficit. She exhibits normal muscle tone. Coordination normal.  Skin: Skin is warm and dry. No rash noted.  Psychiatric: She has a normal mood and affect. Her behavior is normal.          Assessment & Plan:   Annual health assessment Diabetes mellitus type 2. Will decrease metformin to 500 mg daily Hypertension. We'll discontinue hydrochlorothiazide Dyslipidemia. Lipid profile reviewed Moderate left carotid artery stenosis Tobacco abuse    We'll recheck in 6 months Smoking cessation encouraged Laboratory update reviewed  Nyoka Cowden, MD

## 2016-07-22 NOTE — Progress Notes (Signed)
Pre visit review using our clinic review tool, if applicable. No additional management support is needed unless otherwise documented below in the visit note. 

## 2016-07-22 NOTE — Patient Instructions (Signed)
Limit your sodium (Salt) intake   Please check your hemoglobin A1c every 3-6  Months    It is important that you exercise regularly, at least 20 minutes 3 to 4 times per week.  If you develop chest pain or shortness of breath seek  medical attention.     

## 2016-08-02 ENCOUNTER — Other Ambulatory Visit: Payer: Self-pay | Admitting: Internal Medicine

## 2016-08-05 ENCOUNTER — Other Ambulatory Visit: Payer: Self-pay | Admitting: Internal Medicine

## 2016-08-07 DIAGNOSIS — H04123 Dry eye syndrome of bilateral lacrimal glands: Secondary | ICD-10-CM | POA: Diagnosis not present

## 2016-08-19 ENCOUNTER — Telehealth: Payer: Self-pay | Admitting: Internal Medicine

## 2016-08-19 MED ORDER — LISINOPRIL 20 MG PO TABS: 20.0000 mg | ORAL_TABLET | Freq: Two times a day (BID) | ORAL | 3 refills | Status: DC

## 2016-08-19 NOTE — Telephone Encounter (Signed)
Rx sent to pharmacy   

## 2016-08-19 NOTE — Telephone Encounter (Signed)
Pharmacy states they never received rx lisinopril (PRINIVIL,ZESTRIL) 20 MG tablet 180 tabs w/ one refill Can you resend? Pt is out.  [facility]

## 2016-10-01 ENCOUNTER — Other Ambulatory Visit: Payer: Self-pay | Admitting: Internal Medicine

## 2016-11-04 DIAGNOSIS — H04123 Dry eye syndrome of bilateral lacrimal glands: Secondary | ICD-10-CM | POA: Diagnosis not present

## 2016-11-04 DIAGNOSIS — H40033 Anatomical narrow angle, bilateral: Secondary | ICD-10-CM | POA: Diagnosis not present

## 2016-11-12 ENCOUNTER — Ambulatory Visit (INDEPENDENT_AMBULATORY_CARE_PROVIDER_SITE_OTHER): Payer: Medicare Other | Admitting: Internal Medicine

## 2016-11-12 ENCOUNTER — Encounter: Payer: Self-pay | Admitting: Internal Medicine

## 2016-11-12 VITALS — BP 138/64 | HR 99 | Temp 98.4°F | Ht <= 58 in | Wt 100.0 lb

## 2016-11-12 DIAGNOSIS — L723 Sebaceous cyst: Secondary | ICD-10-CM

## 2016-11-12 DIAGNOSIS — E119 Type 2 diabetes mellitus without complications: Secondary | ICD-10-CM | POA: Diagnosis not present

## 2016-11-12 DIAGNOSIS — I10 Essential (primary) hypertension: Secondary | ICD-10-CM | POA: Diagnosis not present

## 2016-11-12 NOTE — Progress Notes (Signed)
Subjective:    Patient ID: [name], female    DOB: 1933/12/27, 82 y.o.   MRN: [id number]  HPI  82 year old patient who has a history of type 2 diabetes and hypertension. Her only complaint today is an enlarging nodule involving the right occipital scalp area.  She had a prior sebaceous cyst.  I&D ed  from this site.  A number of months ago.  BP Readings from Last 3 Encounters:  11/12/16 138/64  07/22/16 120/70  03/18/16 140/80    Lab Results  Component Value Date   HGBA1C 6.6 (H) 07/22/2016    Past Medical History:  Diagnosis Date  . Diabetes mellitus, type 2 ([place])   . History of cervical cancer   . Hyperlipidemia   . Hypertension   . Raynaud's phenomenon   . TN (trigeminal neuralgia)      Social History   Social History  . Marital status: Married    Spouse name: [name]  . Number of children: 1  . Years of education: N/A   Occupational History  . Unemployed Not Employed   Social History Main Topics  . Smoking status: Current Every Day Smoker    Packs/day: 1.50    Years: 60.00    Types: Cigarettes  . Smokeless tobacco: Never Used  . Alcohol use No  . Drug use: No  . Sexual activity: Not on file   Other Topics Concern  . Not on file   Social History Narrative   Married.  Lives in [place] with her husband.  No FH of heart disease.    Past Surgical History:  Procedure Laterality Date  . BREAST BIOPSY    . BREAST ENHANCEMENT SURGERY    . CHOLECYSTECTOMY N/A 02/14/2014   Procedure: LAPAROSCOPIC CHOLECYSTECTOMY;  Surgeon: Ralene Ok, MD;  Location: [facility];  Service: General;  Laterality: N/A;  . ERCP N/A 03/08/2015   Procedure: ENDOSCOPIC RETROGRADE CHOLANGIOPANCREATOGRAPHY (ERCP);  Surgeon: Ladene Artist, MD;  Location: [facility];  Service: Endoscopy;  Laterality: N/A;  . TOTAL ABDOMINAL HYSTERECTOMY  1963   for cervical cancer in situ    No family history on file.  Allergies  Allergen Reactions  . Morphine And Related  Itching    Short lived, mild itching.    Current Outpatient Prescriptions on File Prior to Visit  Medication Sig Dispense Refill  . amLODipine (NORVASC) 2.5 MG tablet TAKE ONE TABLET BY MOUTH ONCE DAILY 90 tablet 1  . aspirin EC 81 MG tablet Take 81 mg by mouth at bedtime.    [name] atorvastatin (LIPITOR) 20 MG tablet TAKE ONE TABLET BY MOUTH ONCE DAILY IN THE MORNING 90 tablet 3  . Blood Glucose Monitoring Suppl (ONE TOUCH ULTRA MINI) w/Device KIT USE TO CHECK BLOOD SUGAR DAILY AND AS NEEDED 1 each 0  . hydrochlorothiazide (HYDRODIURIL) 12.5 MG tablet Take 1 tablet (12.5 mg total) by mouth daily. 90 tablet 1  . ibuprofen (ADVIL,MOTRIN) 200 MG tablet Take 1 tablet (200 mg total) by mouth every 6 (six) hours as needed for mild pain or moderate pain. 30 tablet 5  . Lancets (ONETOUCH ULTRASOFT) lancets Once daily or PRN, Dx E11.9 100 each 12  . lisinopril (PRINIVIL,ZESTRIL) 20 MG tablet Take 1 tablet (20 mg total) by mouth 2 (two) times daily. 180 tablet 3  . metFORMIN (GLUCOPHAGE) 500 MG tablet TAKE ONE TABLET BY MOUTH TWICE DAILY WITH FOOD 180 tablet 1  . Multiple Vitamins-Minerals (WOMENS 50+ MULTI VITAMIN/MIN PO) Take 1 tablet by mouth every morning.     [name]  omeprazole (PRILOSEC) 20 MG capsule Take 1 capsule (20 mg total) by mouth daily. 30 capsule 3  . ONE TOUCH ULTRA TEST test strip USE  STRIP TO CHECK GLUCOSE ONCE DAILY AS DIRECTED 100 each 1  . ONETOUCH DELICA LANCETS 56L MISC USE TO CHECK BLOOD SUGAR ONCE DAILY 100 each 4  . tetrahydrozoline-zinc (VISINE-AC) 0.05-0.25 % ophthalmic solution Place 1 drop into both eyes daily as needed (irritation).     . TURMERIC CURCUMIN PO Take 1 capsule by mouth daily.     No current facility-administered medications on file prior to visit.     BP 138/64 (BP Location: Right Arm, Patient Position: Sitting, Cuff Size: Normal)   Pulse 99   Temp 98.4 F (36.9 C) (Oral)   Ht _0  (1.422 m)   Wt 100 lb (45.4 kg)   SpO2 99%   BMI 22.42 kg/m    Review of  Systems  Constitutional: Negative.   HENT: Negative for congestion, dental problem, hearing loss, rhinorrhea, sinus pressure, sore throat and tinnitus.   Eyes: Negative for pain, discharge and visual disturbance.  Respiratory: Negative for cough and shortness of breath.   Cardiovascular: Negative for chest pain, palpitations and leg swelling.  Gastrointestinal: Negative for abdominal distention, abdominal pain, blood in stool, constipation, diarrhea, nausea and vomiting.  Genitourinary: Negative for difficulty urinating, dysuria, flank pain, frequency, hematuria, pelvic pain, urgency, vaginal bleeding, vaginal discharge and vaginal pain.  Musculoskeletal: Negative for arthralgias, gait problem and joint swelling.  Skin: Negative for rash.       Enlarging nodule, right occipital scalp  Neurological: Negative for dizziness, syncope, speech difficulty, weakness, numbness and headaches.  Hematological: Negative for adenopathy.  Psychiatric/Behavioral: Negative for agitation, behavioral problems and dysphoric mood. The patient is not nervous/anxious.        Objective:   Physical Exam  Constitutional: She is oriented to person, place, and time. She appears well-developed and well-nourished.  HENT:  Head: Normocephalic.  Right Ear: External ear normal.  Left Ear: External ear normal.  Mouth/Throat: Oropharynx is clear and moist.  Eyes: Conjunctivae and EOM are normal. Pupils are equal, round, and reactive to light.  Neck: Normal range of motion. Neck supple. No thyromegaly present.  Cardiovascular: Normal rate, regular rhythm, normal heart sounds and intact distal pulses.   Pulmonary/Chest: Effort normal and breath sounds normal.  Abdominal: Soft. Bowel sounds are normal. She exhibits no mass. There is no tenderness.  Musculoskeletal: Normal range of motion.  Lymphadenopathy:    She has no cervical adenopathy.  Neurological: She is alert and oriented to person, place, and time.  Skin: Skin  is warm and dry. No rash noted.  2 cm firm nodule , right occipital scalp area  Psychiatric: She has a normal mood and affect. Her behavior is normal.          Assessment & Plan:   Diabetes mellitus.  Appears to be well controlled.  Will check a follow-up hemoglobin A1c Essential hypertension, stable Sebaceous cyst.  Recurrent.  Will set up for general surgery referral  Nyoka Cowden

## 2016-11-12 NOTE — Patient Instructions (Signed)
Gen. Surgery consultation as discussed  Please check your blood pressure on a regular basis.  If it is consistently greater than 150/90, please make an office appointment.   Please check your hemoglobin A1c every 3 months

## 2016-11-12 NOTE — Progress Notes (Signed)
Pre visit review using our clinic review tool, if applicable. No additional management support is needed unless otherwise documented below in the visit note. 

## 2016-11-13 LAB — BASIC METABOLIC PANEL
BUN: 15 mg/dL (ref 6–23)
CHLORIDE: 107 meq/L (ref 96–112)
CO2: 25 mEq/L (ref 19–32)
CREATININE: 1.08 mg/dL (ref 0.40–1.20)
Calcium: 9.6 mg/dL (ref 8.4–10.5)
GFR: 51.77 mL/min — AB (ref >60.00)
Glucose, Bld: 101 mg/dL — ABNORMAL HIGH (ref 70–99)
Potassium: 4 mEq/L (ref 3.5–5.1)
Sodium: 143 mEq/L (ref 135–145)

## 2016-11-13 LAB — HEMOGLOBIN A1C: Hgb A1c MFr Bld: 7.8 % — ABNORMAL HIGH (ref 4.6–6.5)

## 2016-11-29 ENCOUNTER — Other Ambulatory Visit: Payer: Self-pay | Admitting: Surgery

## 2016-11-29 DIAGNOSIS — L723 Sebaceous cyst: Secondary | ICD-10-CM | POA: Diagnosis not present

## 2016-11-29 DIAGNOSIS — L089 Local infection of the skin and subcutaneous tissue, unspecified: Secondary | ICD-10-CM | POA: Diagnosis not present

## 2016-12-20 ENCOUNTER — Encounter (HOSPITAL_COMMUNITY): Payer: Self-pay

## 2016-12-20 ENCOUNTER — Inpatient Hospital Stay (HOSPITAL_COMMUNITY)
Admission: RE | Admit: 2016-12-20 | Discharge: 2016-12-20 | Disposition: A | Discharge status: Home / Self Care | Payer: Medicare Other | Source: Ambulatory Visit

## 2016-12-24 NOTE — Progress Notes (Signed)
Reviewed pre-op instructions with pt again. Pt instructed to stop taking Aspirin,vitamins, fish oil and herbal medications   (Tumeric). Do not take any NSAIDs ie: Ibuprofen, Advil, Naproxen, BC and Goody Powder or any medication containing Aspirin. Pt stated that her fasting blood glucose ranges from 120-130. Pt made aware of diabetes protocol to not take Metformin the morning of procedure, check BG every 2 hours prior to arrival, interventions for BG < 70 ( drink 4 ounces of Cranberry or Apple juice, recheck BG 15 minutes after drinking juice and if BG is still <70 call SS unit).

## 2016-12-24 NOTE — H&P (Signed)
[name] 11/29/2016 11:08 AM Location: [facility] Patient #: [id number] DOB: 1934-03-01 Married / Language: English / Race: Asian Female   History of Present Illness (Thai Hemrick A. Ninfa Linden MD; 11/29/2016 11:40 AM) The patient is a 83 year old female who presents with a complaint of Mass. This is a pleasant patient sent by Dr. Bluford Kaufmann for evaluation of an infected sebaceous cyst/mass on her right occipital scalp. She has had it for several years and it has Recently become infected requiring incision and drainage. It is now recurred again. She has pain and drainage from the site. She is otherwise without complaints. She does have diabetes.   Past Surgical History (April Staton, [place]; 11/29/2016 11:08 AM) Breast Biopsy  Bilateral. Cataract Surgery  Bilateral. Cesarean Section - Multiple  Gallbladder Surgery - Laparoscopic   Diagnostic Studies History (April Staton, [place]; 11/29/2016 11:08 AM) Colonoscopy  1-5 years ago Mammogram  >3 years ago Pap Smear  1-5 years ago  Allergies (April Staton, CMA; 11/29/2016 11:10 AM) Morphine Sulfate (Concentrate) *ANALGESICS - OPIOID*   Medication History (April Staton, CMA; 11/29/2016 11:21 AM) Blood Glucose Monitor Active. Lisinopril (20MG  Tablet, Oral) Active. AmLODIPine Besylate (Oral) Specific strength unknown - Active. Atorvastatin Calcium (Oral) Specific strength unknown - Active. MetFORMIN HCl (Oral) Specific strength unknown - Active. Aspirin (81MG  Tablet, Oral) Active. Multiple Vitamin (Oral) Active. Omeprazole (20MG  Capsule DR, Oral) Active. HydroCHLOROthiazide (12.5MG  Capsule, Oral) Active. Ibuprofen 200 (Oral) Specific strength unknown - Active.  Family History (April Staton, [place]; 11/29/2016 11:08 AM) Family history unknown  First Degree Relatives   Pregnancy / Birth History (April Staton, [place]; 11/29/2016 11:08 AM) Age at menarche  79 years. Age of menopause  >49 Gravida   3 Maternal age  15-20 Para  1  Other Problems (April Staton, CMA; 11/29/2016 11:08 AM) Cholelithiasis  Diabetes Mellitus  High blood pressure  Hypercholesterolemia     Review of Systems (April Staton CMA; 11/29/2016 11:08 AM) General Not Present- Appetite Loss, Chills, Fatigue, Fever, Night Sweats, Weight Gain and Weight Loss. Skin Not Present- Change in Wart/Mole, Dryness, Hives, Jaundice, New Lesions, Non-Healing Wounds, Rash and Ulcer. HEENT Not Present- Earache, Hearing Loss, Hoarseness, Nose Bleed, Oral Ulcers, Ringing in the Ears, Seasonal Allergies, Sinus Pain, Sore Throat, Visual Disturbances, Wears glasses/contact lenses and Yellow Eyes. Respiratory Not Present- Bloody sputum, Chronic Cough, Difficulty Breathing, Snoring and Wheezing. Breast Not Present- Breast Mass, Breast Pain, Nipple Discharge and Skin Changes. Cardiovascular Not Present- Chest Pain, Difficulty Breathing Lying Down, Leg Cramps, Palpitations, Rapid Heart Rate, Shortness of Breath and Swelling of Extremities. Gastrointestinal Not Present- Abdominal Pain, Bloating, Bloody Stool, Change in Bowel Habits, Chronic diarrhea, Constipation, Difficulty Swallowing, Excessive gas, Gets full quickly at meals, Hemorrhoids, Indigestion, Nausea, Rectal Pain and Vomiting. Female Genitourinary Not Present- Frequency, Nocturia, Painful Urination, Pelvic Pain and Urgency. Musculoskeletal Not Present- Back Pain, Joint Pain, Joint Stiffness, Muscle Pain, Muscle Weakness and Swelling of Extremities. Neurological Not Present- Decreased Memory, Fainting, Headaches, Numbness, Seizures, Tingling, Tremor, Trouble walking and Weakness. Psychiatric Not Present- Anxiety, Bipolar, Change in Sleep Pattern, Depression, Fearful and Frequent crying. Endocrine Not Present- Cold Intolerance, Excessive Hunger, Hair Changes, Heat Intolerance, Hot flashes and New Diabetes. Hematology Not Present- Blood Thinners, Easy Bruising, Excessive bleeding, Gland  problems, HIV and Persistent Infections.  Vitals (April Staton CMA; 11/29/2016 11:22 AM) 11/29/2016 11:21 AM Weight: 104.25 lb Height: 60in Body Surface Area: 1.42 m Body Mass Index: 20.36 kg/m  Temp.: 56F(Oral)  Pulse: 81 (Regular)  BP: 128/70 (Sitting, Left Arm,  Standard)       Physical Exam (Earle Troiano A. Ninfa Linden MD; 11/29/2016 11:41 AM) The physical exam findings are as follows: Note:On examination, there is a large, 2 cm mass on her right occipital scalp with skin breakdown. It appears consistent with an infected sebaceous cyst. She is generally well appearance Lungs are clear bilaterally Cardiovascular is regular rate and rhythm    Assessment & Plan (Clarnce Homan A. Ninfa Linden MD; 11/29/2016 11:41 AM) INFECTED SEBACEOUS CYST (L72.3) Impression: This is a large cyst that will need excision in the operating room given its location and size. I believe needs to be removed because of this recurrent infections and her diabetes as well as to rule out any kind of skin cancer. I discussed the surgery with her. I discussed the risk of bleeding, infection, and recurrence. She understands and wishes to proceed with surgery

## 2016-12-24 NOTE — Progress Notes (Signed)
Pt verbalized understanding of all pre-op instructions. 

## 2016-12-25 ENCOUNTER — Encounter (HOSPITAL_COMMUNITY)
Admission: RE | Disposition: A | Discharge status: Home / Self Care | Payer: Self-pay | Source: Ambulatory Visit | Attending: Surgery

## 2016-12-25 ENCOUNTER — Ambulatory Visit (HOSPITAL_COMMUNITY): Payer: PPO | Admitting: Anesthesiology

## 2016-12-25 ENCOUNTER — Other Ambulatory Visit: Payer: Self-pay

## 2016-12-25 ENCOUNTER — Encounter (HOSPITAL_COMMUNITY): Payer: Self-pay | Admitting: *Deleted

## 2016-12-25 ENCOUNTER — Ambulatory Visit (HOSPITAL_COMMUNITY)
Admission: RE | Admit: 2016-12-25 | Discharge: 2016-12-25 | Disposition: A | Discharge status: Home / Self Care | Payer: PPO | Source: Ambulatory Visit | Attending: Surgery | Admitting: Surgery

## 2016-12-25 DIAGNOSIS — I10 Essential (primary) hypertension: Secondary | ICD-10-CM | POA: Diagnosis not present

## 2016-12-25 DIAGNOSIS — Z7984 Long term (current) use of oral hypoglycemic drugs: Secondary | ICD-10-CM | POA: Diagnosis not present

## 2016-12-25 DIAGNOSIS — L720 Epidermal cyst: Secondary | ICD-10-CM | POA: Insufficient documentation

## 2016-12-25 DIAGNOSIS — L723 Sebaceous cyst: Secondary | ICD-10-CM | POA: Diagnosis not present

## 2016-12-25 DIAGNOSIS — K219 Gastro-esophageal reflux disease without esophagitis: Secondary | ICD-10-CM | POA: Diagnosis not present

## 2016-12-25 DIAGNOSIS — F17200 Nicotine dependence, unspecified, uncomplicated: Secondary | ICD-10-CM | POA: Diagnosis not present

## 2016-12-25 DIAGNOSIS — Z7982 Long term (current) use of aspirin: Secondary | ICD-10-CM | POA: Insufficient documentation

## 2016-12-25 DIAGNOSIS — Z791 Long term (current) use of non-steroidal anti-inflammatories (NSAID): Secondary | ICD-10-CM | POA: Diagnosis not present

## 2016-12-25 DIAGNOSIS — E119 Type 2 diabetes mellitus without complications: Secondary | ICD-10-CM | POA: Diagnosis not present

## 2016-12-25 DIAGNOSIS — E1151 Type 2 diabetes mellitus with diabetic peripheral angiopathy without gangrene: Secondary | ICD-10-CM | POA: Diagnosis not present

## 2016-12-25 DIAGNOSIS — Z79899 Other long term (current) drug therapy: Secondary | ICD-10-CM | POA: Insufficient documentation

## 2016-12-25 DIAGNOSIS — R220 Localized swelling, mass and lump, head: Secondary | ICD-10-CM | POA: Diagnosis not present

## 2016-12-25 DIAGNOSIS — I7300 Raynaud's syndrome without gangrene: Secondary | ICD-10-CM | POA: Diagnosis not present

## 2016-12-25 DIAGNOSIS — E785 Hyperlipidemia, unspecified: Secondary | ICD-10-CM | POA: Diagnosis not present

## 2016-12-25 HISTORY — PX: MASS EXCISION: SHX2000

## 2016-12-25 LAB — CBC
HCT: 47.2 % — ABNORMAL HIGH (ref 36.0–46.0)
Hemoglobin: 15.7 g/dL — ABNORMAL HIGH (ref 12.0–15.0)
MCH: 32 pg (ref 26.0–34.0)
MCHC: 33.3 g/dL (ref 30.0–36.0)
MCV: 96.1 fL (ref 78.0–100.0)
Platelets: 224 10*3/uL (ref 150–400)
RBC: 4.91 MIL/uL (ref 3.87–5.11)
RDW: 13.8 % (ref 11.5–15.5)
WBC: 12 10*3/uL — ABNORMAL HIGH (ref 4.0–10.5)

## 2016-12-25 LAB — BASIC METABOLIC PANEL
Anion gap: 5 (ref 5–15)
BUN: 13 mg/dL (ref 6–20)
CO2: 29 mmol/L (ref 22–32)
CREATININE: 0.93 mg/dL (ref 0.44–1.00)
Calcium: 9.3 mg/dL (ref 8.9–10.3)
Chloride: 109 mmol/L (ref 101–111)
GFR calc non Af Amer: 57 mL/min — ABNORMAL LOW (ref >60)
Glucose, Bld: 119 mg/dL — ABNORMAL HIGH (ref 65–99)
Potassium: 3.3 mmol/L — ABNORMAL LOW (ref 3.5–5.1)
SODIUM: 143 mmol/L (ref 135–145)

## 2016-12-25 LAB — GLUCOSE, CAPILLARY
GLUCOSE-CAPILLARY: 106 mg/dL — AB (ref 65–99)
GLUCOSE-CAPILLARY: 95 mg/dL (ref 65–99)

## 2016-12-25 SURGERY — EXCISION MASS
Anesthesia: Monitor Anesthesia Care | Site: Scalp | Laterality: Right

## 2016-12-25 MED ORDER — FENTANYL CITRATE (PF) 100 MCG/2ML IJ SOLN
INTRAMUSCULAR | Status: AC
  Filled 2016-12-25: qty 2

## 2016-12-25 MED ORDER — FENTANYL CITRATE (PF) 100 MCG/2ML IJ SOLN
25.0000 ug | INTRAMUSCULAR | Status: DC | PRN
  Administered 2016-12-25: 25 ug via INTRAVENOUS

## 2016-12-25 MED ORDER — ONDANSETRON HCL 4 MG/2ML IJ SOLN: 4.0000 mg | Freq: Once | INTRAMUSCULAR | Status: DC | PRN

## 2016-12-25 MED ORDER — PROPOFOL 10 MG/ML IV BOLUS
INTRAVENOUS | Status: AC
  Filled 2016-12-25: qty 20

## 2016-12-25 MED ORDER — LIDOCAINE-EPINEPHRINE (PF) 1 %-1:200000 IJ SOLN
INTRAMUSCULAR | Status: AC
  Filled 2016-12-25: qty 30

## 2016-12-25 MED ORDER — OXYCODONE HCL 5 MG PO TABS
5.0000 mg | ORAL_TABLET | ORAL | Status: DC | PRN
  Administered 2016-12-25: 10 mg via ORAL

## 2016-12-25 MED ORDER — 0.9 % SODIUM CHLORIDE (POUR BTL) OPTIME
TOPICAL | Status: DC | PRN
  Administered 2016-12-25: 1000 mL

## 2016-12-25 MED ORDER — BUPIVACAINE HCL (PF) 0.25 % IJ SOLN
INTRAMUSCULAR | Status: AC
  Filled 2016-12-25: qty 30

## 2016-12-25 MED ORDER — ACETAMINOPHEN 325 MG PO TABS
ORAL_TABLET | ORAL | Status: AC
  Filled 2016-12-25: qty 2

## 2016-12-25 MED ORDER — CHLORHEXIDINE GLUCONATE CLOTH 2 % EX PADS: 6.0000 | MEDICATED_PAD | Freq: Once | CUTANEOUS | Status: DC

## 2016-12-25 MED ORDER — LACTATED RINGERS IV SOLN
INTRAVENOUS | Status: DC
  Administered 2016-12-25 (×2): via INTRAVENOUS

## 2016-12-25 MED ORDER — SODIUM CHLORIDE 0.9% FLUSH: 3.0000 mL | Freq: Two times a day (BID) | INTRAVENOUS | Status: DC

## 2016-12-25 MED ORDER — OXYCODONE HCL 5 MG PO TABS
ORAL_TABLET | ORAL | Status: AC
  Filled 2016-12-25: qty 2

## 2016-12-25 MED ORDER — FENTANYL CITRATE (PF) 100 MCG/2ML IJ SOLN
INTRAMUSCULAR | Status: DC
Start: 2016-12-25 — End: 2016-12-25
  Filled 2016-12-25: qty 2

## 2016-12-25 MED ORDER — BACITRACIN-NEOMYCIN-POLYMYXIN 400-5-5000 EX OINT
TOPICAL_OINTMENT | CUTANEOUS | Status: AC
  Filled 2016-12-25: qty 1

## 2016-12-25 MED ORDER — CEFAZOLIN SODIUM-DEXTROSE 2-4 GM/100ML-% IV SOLN
2.0000 g | INTRAVENOUS | Status: AC
  Administered 2016-12-25: 2 g via INTRAVENOUS
  Filled 2016-12-25: qty 100

## 2016-12-25 MED ORDER — ACETAMINOPHEN 325 MG PO TABS
650.0000 mg | ORAL_TABLET | ORAL | Status: DC | PRN
Start: 2016-12-25 — End: 2016-12-25
  Administered 2016-12-25: 650 mg via ORAL

## 2016-12-25 MED ORDER — TRAMADOL HCL 50 MG PO TABS: 50.0000 mg | ORAL_TABLET | Freq: Four times a day (QID) | ORAL | 0 refills | Status: DC | PRN

## 2016-12-25 MED ORDER — FENTANYL CITRATE (PF) 100 MCG/2ML IJ SOLN
INTRAMUSCULAR | Status: DC | PRN
  Administered 2016-12-25: 100 ug via INTRAVENOUS

## 2016-12-25 MED ORDER — BACITRACIN-NEOMYCIN-POLYMYXIN OINTMENT TUBE
TOPICAL_OINTMENT | CUTANEOUS | Status: DC | PRN
  Administered 2016-12-25: 1 via TOPICAL

## 2016-12-25 MED ORDER — PROPOFOL 10 MG/ML IV BOLUS
INTRAVENOUS | Status: DC | PRN
  Administered 2016-12-25: 30 mg via INTRAVENOUS

## 2016-12-25 MED ORDER — SODIUM CHLORIDE 0.9% FLUSH: 3.0000 mL | INTRAVENOUS | Status: DC | PRN

## 2016-12-25 MED ORDER — ACETAMINOPHEN 650 MG RE SUPP: 650.0000 mg | RECTAL | Status: DC | PRN

## 2016-12-25 MED ORDER — LIDOCAINE-EPINEPHRINE (PF) 1 %-1:200000 IJ SOLN
INTRAMUSCULAR | Status: DC | PRN
  Administered 2016-12-25: 10 mL

## 2016-12-25 MED ORDER — SODIUM CHLORIDE 0.9 % IV SOLN: 250.0000 mL | INTRAVENOUS | Status: DC | PRN

## 2016-12-25 SURGICAL SUPPLY — 39 items
BLADE SURG 10 STRL SS (BLADE) ×3 IMPLANT
BLADE SURG 15 STRL LF DISP TIS (BLADE) ×1 IMPLANT
BLADE SURG 15 STRL SS (BLADE) ×2
CANISTER SUCTION 2500CC (MISCELLANEOUS) IMPLANT
COVER SURGICAL LIGHT HANDLE (MISCELLANEOUS) ×3 IMPLANT
DERMABOND ADVANCED (GAUZE/BANDAGES/DRESSINGS) ×2
DERMABOND ADVANCED .7 DNX12 (GAUZE/BANDAGES/DRESSINGS) ×1 IMPLANT
DRAPE LAPAROSCOPIC ABDOMINAL (DRAPES) IMPLANT
DRAPE LAPAROTOMY 100X72 PEDS (DRAPES) IMPLANT
DRAPE UTILITY XL STRL (DRAPES) ×3 IMPLANT
DRSG TEGADERM 4X4.75 (GAUZE/BANDAGES/DRESSINGS) ×3 IMPLANT
ELECT CAUTERY BLADE 6.4 (BLADE) ×3 IMPLANT
ELECT REM PT RETURN 9FT ADLT (ELECTROSURGICAL) ×3
ELECTRODE REM PT RTRN 9FT ADLT (ELECTROSURGICAL) ×1 IMPLANT
GAUZE SPONGE 4X4 12PLY STRL (GAUZE/BANDAGES/DRESSINGS) ×3 IMPLANT
GLOVE SURG SIGNA 7.5 PF LTX (GLOVE) ×3 IMPLANT
GOWN STRL REUS W/ TWL LRG LVL3 (GOWN DISPOSABLE) ×1 IMPLANT
GOWN STRL REUS W/ TWL XL LVL3 (GOWN DISPOSABLE) ×1 IMPLANT
GOWN STRL REUS W/TWL LRG LVL3 (GOWN DISPOSABLE) ×2
GOWN STRL REUS W/TWL XL LVL3 (GOWN DISPOSABLE) ×2
KIT BASIN OR (CUSTOM PROCEDURE TRAY) ×3 IMPLANT
KIT ROOM TURNOVER OR (KITS) ×3 IMPLANT
NEEDLE HYPO 25X1 1.5 SAFETY (NEEDLE) ×3 IMPLANT
NS IRRIG 1000ML POUR BTL (IV SOLUTION) ×3 IMPLANT
PACK SURGICAL SETUP 50X90 (CUSTOM PROCEDURE TRAY) ×3 IMPLANT
PAD ARMBOARD 7.5X6 YLW CONV (MISCELLANEOUS) ×3 IMPLANT
PENCIL BUTTON HOLSTER BLD 10FT (ELECTRODE) ×3 IMPLANT
SPECIMEN JAR SMALL (MISCELLANEOUS) ×3 IMPLANT
SPONGE LAP 18X18 X RAY DECT (DISPOSABLE) ×3 IMPLANT
SUT MNCRL AB 4-0 PS2 18 (SUTURE) ×3 IMPLANT
SUT VIC AB 3-0 SH 27 (SUTURE) ×2
SUT VIC AB 3-0 SH 27XBRD (SUTURE) ×1 IMPLANT
SYR BULB 3OZ (MISCELLANEOUS) ×3 IMPLANT
SYR CONTROL 10ML LL (SYRINGE) ×3 IMPLANT
TOWEL OR 17X24 6PK STRL BLUE (TOWEL DISPOSABLE) ×3 IMPLANT
TOWEL OR 17X26 10 PK STRL BLUE (TOWEL DISPOSABLE) ×3 IMPLANT
TUBE CONNECTING 12'X1/4 (SUCTIONS)
TUBE CONNECTING 12X1/4 (SUCTIONS) IMPLANT
YANKAUER SUCT BULB TIP NO VENT (SUCTIONS) IMPLANT

## 2016-12-25 NOTE — Interval H&P Note (Signed)
History and Physical Interval Note: no change in H and P  12/25/2016 11:29 AM  [name]  has presented today for surgery, with the diagnosis of RIGHT SCALP MASS  The various methods of treatment have been discussed with the patient and family. After consideration of risks, benefits and other options for treatment, the patient has consented to  Procedure(s): EXCISION RIGHT SCALP MASS SEBACEOUS CYST (Right) as a surgical intervention .  The patient's history has been reviewed, patient examined, no change in status, stable for surgery.  I have reviewed the patient's chart and labs.  Questions were answered to the patient's satisfaction.     [name] A

## 2016-12-25 NOTE — Transfer of Care (Signed)
Immediate Anesthesia Transfer of Care Note  Patient: [name]  Procedure(s) Performed: Procedure(s): EXCISION RIGHT SCALP MASS SEBACEOUS CYST (Right)  Patient Location: PACU  Anesthesia Type:MAC  Level of Consciousness: awake, alert , oriented and patient cooperative  Airway & Oxygen Therapy: Patient Spontanous Breathing and Patient connected to nasal cannula oxygen  Post-op Assessment: Report given to RN, Post -op Vital signs reviewed and stable and Patient moving all extremities  Post vital signs: Reviewed and stable  Last Vitals:  Vitals:   12/25/16 1000 12/25/16 1310  BP: (!) 182/65 (!) 174/65  Pulse:  74  Resp:  15  Temp:      Last Pain:  Vitals:   12/25/16 0911  TempSrc: Oral      Patients Stated Pain Goal: 0 (11/65/79 0383)  Complications: No apparent anesthesia complications

## 2016-12-25 NOTE — Op Note (Signed)
EXCISION RIGHT SCALP MASS SEBACEOUS CYST  Procedure Note  [name] 12/25/2016   Pre-op Diagnosis: RIGHT SCALP MASS     Post-op Diagnosis: right scalp sebaceous cyst  Procedure(s): EXCISION RIGHT SCALP MASS SEBACEOUS CYST  Surgeon(s): Coralie Keens, MD  Anesthesia: Monitor Anesthesia Care  Staff:  Circulator: Rosanne Sack, RN Scrub Person: Georgeanna Harrison Circulator Assistant: Nicholos Johns, RN; Christen Bame, RN  Estimated Blood Loss: Minimal               Specimens: sent to path  Indications: This is an 83 year old female with a 2 cm mass on her scalp that appears to be eroding through the skin. The decision has been made to proceed with excision.  Findings: The mass was consistent with a sebaceous cyst.  Procedure: The patient was brought to the operating room and identified as the correct patient. She happened table and anesthesia was induced. Her right posterior scalp was then prepped and draped in the usual sterile fashion. I anesthetized the skin over the mass which was radiated skin with lidocaine with epinephrine. I made an elliptical incision around the mass with a scalpel. I then took this down into the subjacent tissue with the scalpel. It then became apparent that this was a large sebaceous cyst eroding through the skin. I removed the cyst and its capsule and its entirety with the scalpel. It was then sent to pathology for evaluation. Hemostasis was then achieved with the cautery. I anesthetized wound further with lidocaine. I then closed the skin with interrupted 3-0 Prolene sutures. Antibiotic ointment was then applied. The patient tolerated the procedure well. All the counts were correct at the end procedure. The patient was then taken in a stable condition to the recovery room.            [name] A   Date: 12/25/2016  Time: 1:06 PM

## 2016-12-25 NOTE — Anesthesia Preprocedure Evaluation (Signed)
Anesthesia Evaluation  Patient identified by MRN, date of birth, ID band Patient awake    Reviewed: Allergy & Precautions, NPO status , Patient's Chart, lab work & pertinent test results  Airway Mallampati: II  TM Distance: >3 FB Neck ROM: Full    Dental no notable dental hx. (+) Teeth Intact   Pulmonary Current Smoker,    Pulmonary exam normal breath sounds clear to auscultation       Cardiovascular hypertension, Pt. on medications + Peripheral Vascular Disease  Normal cardiovascular exam Rhythm:Regular Rate:Normal  Raynaud's phenomenon   Neuro/Psych  Neuromuscular disease negative psych ROS   GI/Hepatic Neg liver ROS, GERD  Medicated and Controlled,  Endo/Other  diabetes, Well Controlled, Type 2, Oral Hypoglycemic AgentsHyperlipidemia  Renal/GU negative Renal ROS  negative genitourinary   Musculoskeletal negative musculoskeletal ROS (+)   Abdominal (+) - obese,   Peds  Hematology negative hematology ROS (+)   Anesthesia Other Findings   Reproductive/Obstetrics                             Anesthesia Physical Anesthesia Plan  ASA: II  Anesthesia Plan: MAC   Post-op Pain Management:    Induction:   Airway Management Planned: Natural Airway and Simple Face Mask  Additional Equipment:   Intra-op Plan:   Post-operative Plan:   Informed Consent: I have reviewed the patients History and Physical, chart, labs and discussed the procedure including the risks, benefits and alternatives for the proposed anesthesia with the patient or authorized representative who has indicated his/her understanding and acceptance.   Dental advisory given  Plan Discussed with: Anesthesiologist, CRNA and Surgeon  Anesthesia Plan Comments:         Anesthesia Quick Evaluation

## 2016-12-25 NOTE — Discharge Instructions (Signed)
Ok to shower tomorrow  Call office for visit next week to remove sutures

## 2016-12-25 NOTE — Anesthesia Postprocedure Evaluation (Signed)
Anesthesia Post Note  Patient: [name]  Procedure(s) Performed: Procedure(s) (LRB): EXCISION RIGHT SCALP MASS SEBACEOUS CYST (Right)  Patient location during evaluation: PACU Anesthesia Type: MAC Level of consciousness: awake and alert Pain management: pain level controlled Vital Signs Assessment: post-procedure vital signs reviewed and stable Respiratory status: spontaneous breathing, nonlabored ventilation, respiratory function stable and patient connected to nasal cannula oxygen Cardiovascular status: stable and blood pressure returned to baseline Anesthetic complications: no       Last Vitals:  Vitals:   12/25/16 1430 12/25/16 1440  BP:  (!) 169/78  Pulse: 61 63  Resp: 13 14  Temp:  36.7 C    Last Pain:  Vitals:   12/25/16 0911  TempSrc: Oral                 Brenya Taulbee S

## 2016-12-26 ENCOUNTER — Encounter (HOSPITAL_COMMUNITY): Payer: Self-pay | Admitting: Surgery

## 2016-12-30 ENCOUNTER — Other Ambulatory Visit: Payer: Self-pay | Admitting: Internal Medicine

## 2016-12-30 NOTE — Telephone Encounter (Signed)
Pt is out of metformin

## 2017-01-01 NOTE — Anesthesia Postprocedure Evaluation (Signed)
Anesthesia Post Note  Patient: [name]  Procedure(s) Performed: Procedure(s) (LRB): EXCISION RIGHT SCALP MASS SEBACEOUS CYST (Right)  Patient location during evaluation: PACU Anesthesia Type: MAC Level of consciousness: awake and alert Pain management: pain level controlled Vital Signs Assessment: post-procedure vital signs reviewed and stable Respiratory status: spontaneous breathing, nonlabored ventilation, respiratory function stable and patient connected to nasal cannula oxygen Cardiovascular status: stable and blood pressure returned to baseline Anesthetic complications: no       Last Vitals:  Vitals:   12/25/16 1440 12/25/16 1456  BP: (!) 169/78 (!) 185/86  Pulse: 63 79  Resp: 14 18  Temp: 36.7 C     Last Pain:  Vitals:   12/25/16 0911  TempSrc: Oral                 Mykle Pascua S

## 2017-01-10 ENCOUNTER — Encounter (HOSPITAL_COMMUNITY): Payer: Self-pay | Admitting: Surgery

## 2017-02-04 ENCOUNTER — Ambulatory Visit (INDEPENDENT_AMBULATORY_CARE_PROVIDER_SITE_OTHER): Payer: PPO | Admitting: Adult Health

## 2017-02-04 ENCOUNTER — Encounter: Payer: Self-pay | Admitting: Adult Health

## 2017-02-04 VITALS — BP 138/80 | Temp 97.6°F | Ht <= 58 in | Wt 104.6 lb

## 2017-02-04 DIAGNOSIS — R350 Frequency of micturition: Secondary | ICD-10-CM

## 2017-02-04 DIAGNOSIS — R079 Chest pain, unspecified: Secondary | ICD-10-CM

## 2017-02-04 LAB — BASIC METABOLIC PANEL
BUN: 14 mg/dL (ref 6–23)
CALCIUM: 10.1 mg/dL (ref 8.4–10.5)
CO2: 28 meq/L (ref 19–32)
CREATININE: 0.96 mg/dL (ref 0.40–1.20)
Chloride: 104 mEq/L (ref 96–112)
GFR: 59.28 mL/min — AB (ref >60.00)
Glucose, Bld: 237 mg/dL — ABNORMAL HIGH (ref 70–99)
Potassium: 4.1 mEq/L (ref 3.5–5.1)
Sodium: 142 mEq/L (ref 135–145)

## 2017-02-04 LAB — POCT URINALYSIS DIPSTICK
Bilirubin, UA: NEGATIVE
KETONES UA: NEGATIVE
Leukocytes, UA: NEGATIVE
Nitrite, UA: NEGATIVE
PH UA: 6.5
PROTEIN UA: NEGATIVE
RBC UA: NEGATIVE
SPEC GRAV UA: 1.015
Urobilinogen, UA: NEGATIVE — AB

## 2017-02-04 LAB — HEMOGLOBIN A1C: Hgb A1c MFr Bld: 7.7 % — ABNORMAL HIGH (ref 4.6–6.5)

## 2017-02-04 NOTE — Progress Notes (Signed)
Subjective:    Patient ID: [name], female    DOB: 02/12/1934, 83 y.o.   MRN: [id number]  HPI  83 year old female who  has a past medical history of Diabetes mellitus, type 2 ([place]); History of cervical cancer; Hyperlipidemia; Hypertension; Raynaud's phenomenon; and TN (trigeminal neuralgia). She presents with the complaint of chest pain. She reports that one week ago she had midsternal chest pain that was described as "sharp". She reports that the pain lasted a few hours and when she woke up in the morning the pain was gone. She denies any pain since that time but that " It does not feel normal. It doesn't hurt but there is some discomfort." The pain did not radiate. She denies SOB, cough, chest tightness, n/v/d   She also reports that she is having to urinate a lot more.   Her last A1c was 7.8 in December, this had increased from 6.6    Review of Systems See HPI  Past Medical History:  Diagnosis Date  . Diabetes mellitus, type 2 ([place])   . History of cervical cancer   . Hyperlipidemia   . Hypertension   . Raynaud's phenomenon   . TN (trigeminal neuralgia)     Social History   Social History  . Marital status: Married    Spouse name: [name]  . Number of children: 1  . Years of education: N/A   Occupational History  . Unemployed Not Employed   Social History Main Topics  . Smoking status: Current Every Day Smoker    Packs/day: 1.50    Years: 60.00    Types: Cigarettes  . Smokeless tobacco: Never Used  . Alcohol use No  . Drug use: No  . Sexual activity: Not on file   Other Topics Concern  . Not on file   Social History Narrative   Married.  Lives in [place] with her husband.  No FH of heart disease.    Past Surgical History:  Procedure Laterality Date  . BREAST BIOPSY    . BREAST ENHANCEMENT SURGERY    . CHOLECYSTECTOMY N/A 02/14/2014   Procedure: LAPAROSCOPIC CHOLECYSTECTOMY;  Surgeon: Ralene Ok, MD;  Location: [facility];  Service: General;   Laterality: N/A;  . ERCP N/A 03/08/2015   Procedure: ENDOSCOPIC RETROGRADE CHOLANGIOPANCREATOGRAPHY (ERCP);  Surgeon: Ladene Artist, MD;  Location: [facility];  Service: Endoscopy;  Laterality: N/A;  . MASS EXCISION Right 12/25/2016   Procedure: EXCISION RIGHT SCALP MASS SEBACEOUS CYST;  Surgeon: Coralie Keens, MD;  Location: [facility];  Service: General;  Laterality: Right;  . TOTAL ABDOMINAL HYSTERECTOMY  1963   for cervical cancer in situ    No family history on file.  Allergies  Allergen Reactions  . Morphine And Related Itching    Short lived, mild itching.    Current Outpatient Prescriptions on File Prior to Visit  Medication Sig Dispense Refill  . aspirin EC 81 MG tablet Take 81 mg by mouth at bedtime.    [name] atorvastatin (LIPITOR) 20 MG tablet TAKE ONE TABLET BY MOUTH ONCE DAILY IN THE MORNING (Patient taking differently: TAKE ONE TABLET BY MOUTH ONCE DAILY WITH LUNCH) 90 tablet 3  . Blood Glucose Monitoring Suppl (ONE TOUCH ULTRA MINI) w/Device KIT USE TO CHECK BLOOD SUGAR DAILY AND AS NEEDED 1 each 0  . carboxymethylcellulose (REFRESH PLUS) 0.5 % SOLN Place 1 drop into both eyes 2 (two) times daily.    . Lancets (ONETOUCH ULTRASOFT) lancets Once daily or PRN, Dx  E11.9 100 each 12  . lisinopril (PRINIVIL,ZESTRIL) 20 MG tablet Take 1 tablet (20 mg total) by mouth 2 (two) times daily. 180 tablet 3  . metFORMIN (GLUCOPHAGE) 500 MG tablet TAKE ONE TABLET BY MOUTH TWICE DAILY WITH FOOD 180 tablet 1  . Multiple Vitamins-Minerals (WOMENS 50+ MULTI VITAMIN/MIN PO) Take 1 tablet by mouth daily with lunch.     . ONE TOUCH ULTRA TEST test strip USE  STRIP TO CHECK GLUCOSE ONCE DAILY AS DIRECTED 100 each 1  . ONETOUCH DELICA LANCETS 00L MISC USE TO CHECK BLOOD SUGAR ONCE DAILY 100 each 4  . TURMERIC CURCUMIN PO Take 1 capsule by mouth daily with lunch.      No current facility-administered medications on file prior to visit.     BP 138/80 (BP Location: Left Arm, Patient Position:  Sitting)   Temp 97.6 F (36.4 C) (Oral)   Ht '4\' 8"'  (1.422 m)   Wt 104 lb 9.6 oz (47.4 kg)   BMI 23.45 kg/m       Objective:   Physical Exam  Constitutional: She is oriented to person, place, and time. She appears well-developed and well-nourished. No distress.  Cardiovascular: Normal rate, regular rhythm, normal heart sounds and intact distal pulses.  Exam reveals no gallop and no friction rub.   No murmur heard. Pulmonary/Chest: Effort normal and breath sounds normal. No respiratory distress. She has no wheezes. She has no rales. She exhibits tenderness. She exhibits no crepitus.  Abdominal: Soft. Bowel sounds are normal. She exhibits no distension and no mass. There is no hepatosplenomegaly, splenomegaly or hepatomegaly. There is no tenderness. There is no rebound, no guarding and no CVA tenderness.  Musculoskeletal: She exhibits tenderness (tenderness with palpation to chest wall. ).  Neurological: She is alert and oriented to person, place, and time.  Skin: Skin is warm and dry. No rash noted. She is not diaphoretic. No erythema. No pallor.  Psychiatric: She has a normal mood and affect. Her behavior is normal. Judgment and thought content normal.  Nursing note and vitals reviewed.      Assessment & Plan:  1. Chest pain, unspecified type - Seems to be intercostal pain. Will have her take motrin for the next 3 days, can also use heating pad. Follow up if no improvement  - EKG 12-Lead- NSR, rate 89  - Basic metabolic panel 2. Urinary frequency - No UTI. Possible related to diabetes  - POCT urinalysis dipstick- 3+ glucose, negative ketones - Basic metabolic panel - Hemoglobin A1c - Consider adding additional agent   Dorothyann Peng, NP

## 2017-02-04 NOTE — Patient Instructions (Signed)
Your EKG was normal. Please take Motrin for the next 3 days to help with pain   I will follow up with you about your blood work.   I think your elevated blood sugars are causing you to urinate more. Please work on reducing your blood sugars through diet and exercise.

## 2017-03-20 ENCOUNTER — Other Ambulatory Visit: Payer: Self-pay | Admitting: Internal Medicine

## 2017-03-20 DIAGNOSIS — I6523 Occlusion and stenosis of bilateral carotid arteries: Secondary | ICD-10-CM

## 2017-03-24 ENCOUNTER — Inpatient Hospital Stay (HOSPITAL_COMMUNITY): Admission: RE | Admit: 2017-03-24 | Payer: PPO | Source: Ambulatory Visit

## 2017-04-08 ENCOUNTER — Ambulatory Visit (HOSPITAL_COMMUNITY)
Admission: RE | Admit: 2017-04-08 | Discharge: 2017-04-08 | Disposition: A | Discharge status: Home / Self Care | Payer: PPO | Source: Ambulatory Visit | Attending: Cardiology | Admitting: Cardiology

## 2017-04-08 DIAGNOSIS — I6523 Occlusion and stenosis of bilateral carotid arteries: Secondary | ICD-10-CM | POA: Diagnosis not present

## 2017-04-14 ENCOUNTER — Other Ambulatory Visit: Payer: Self-pay | Admitting: Internal Medicine

## 2017-04-28 NOTE — Addendum Note (Signed)
Addendum  created 04/28/17 1041 by Jakyiah Briones, MD   Sign clinical note    

## 2017-04-28 NOTE — Anesthesia Postprocedure Evaluation (Signed)
Anesthesia Post Note  Patient: [name]  Procedure(s) Performed: Procedure(s) (LRB): EXCISION RIGHT SCALP MASS SEBACEOUS CYST (Right)     Anesthesia Post Evaluation  Last Vitals:  Vitals:   12/25/16 1440 12/25/16 1456  BP: (!) 169/78 (!) 185/86  Pulse: 63 79  Resp: 14 18  Temp: 36.7 C     Last Pain:  Vitals:   12/25/16 0911  TempSrc: Oral                 Modesta Sammons S

## 2017-05-23 ENCOUNTER — Encounter: Payer: Self-pay | Admitting: Internal Medicine

## 2017-05-23 ENCOUNTER — Ambulatory Visit (INDEPENDENT_AMBULATORY_CARE_PROVIDER_SITE_OTHER): Payer: PPO | Admitting: Internal Medicine

## 2017-05-23 VITALS — BP 138/68 | HR 86 | Temp 98.2°F | Ht <= 58 in | Wt 100.2 lb

## 2017-05-23 DIAGNOSIS — E119 Type 2 diabetes mellitus without complications: Secondary | ICD-10-CM | POA: Diagnosis not present

## 2017-05-23 DIAGNOSIS — E785 Hyperlipidemia, unspecified: Secondary | ICD-10-CM | POA: Diagnosis not present

## 2017-05-23 DIAGNOSIS — I779 Disorder of arteries and arterioles, unspecified: Secondary | ICD-10-CM

## 2017-05-23 DIAGNOSIS — Z720 Tobacco use: Secondary | ICD-10-CM

## 2017-05-23 DIAGNOSIS — I739 Peripheral vascular disease, unspecified: Secondary | ICD-10-CM

## 2017-05-23 DIAGNOSIS — I10 Essential (primary) hypertension: Secondary | ICD-10-CM | POA: Diagnosis not present

## 2017-05-23 LAB — POCT GLYCOSYLATED HEMOGLOBIN (HGB A1C): Hemoglobin A1C: 7

## 2017-05-23 NOTE — Progress Notes (Signed)
Subjective:    Patient ID: [name], female    DOB: 1934-03-27, 83 y.o.   MRN: [id number]  HPI  Lab Results  Component Value Date   HGBA1C 7.7 (H) 02/04/2017    83 year old patient who is seen today for follow-up.  Her last hemoglobin A1c was elevated.  She has improved the nonpharmacologic management.  She remains on metformin 500 mg twice a day Repeat hemoglobin A1c today 7.0 She has a history of essential hypertension and ongoing tobacco use.  She has mild dyslipidemia.She remains on statin therapy      No cardiopulmonary complaints.  She continues to smoke  She has history of carotid artery stenosis and recent follow-up revealed stable disease.  Denies any focal neurological symptoms  Past Medical History:  Diagnosis Date  . Diabetes mellitus, type 2 ([place])   . History of cervical cancer   . Hyperlipidemia   . Hypertension   . Raynaud's phenomenon   . TN (trigeminal neuralgia)      Social History   Social History  . Marital status: Married    Spouse name: [name]  . Number of children: 1  . Years of education: N/A   Occupational History  . Unemployed Not Employed   Social History Main Topics  . Smoking status: Current Every Day Smoker    Packs/day: 1.50    Years: 60.00    Types: Cigarettes  . Smokeless tobacco: Never Used  . Alcohol use No  . Drug use: No  . Sexual activity: Not on file   Other Topics Concern  . Not on file   Social History Narrative   Married.  Lives in [place] with her husband.  No FH of heart disease.    Past Surgical History:  Procedure Laterality Date  . BREAST BIOPSY    . BREAST ENHANCEMENT SURGERY    . CHOLECYSTECTOMY N/A 02/14/2014   Procedure: LAPAROSCOPIC CHOLECYSTECTOMY;  Surgeon: Ralene Ok, MD;  Location: [facility];  Service: General;  Laterality: N/A;  . ERCP N/A 03/08/2015   Procedure: ENDOSCOPIC RETROGRADE CHOLANGIOPANCREATOGRAPHY (ERCP);  Surgeon: Ladene Artist, MD;  Location: [facility];   Service: Endoscopy;  Laterality: N/A;  . MASS EXCISION Right 12/25/2016   Procedure: EXCISION RIGHT SCALP MASS SEBACEOUS CYST;  Surgeon: Coralie Keens, MD;  Location: [facility];  Service: General;  Laterality: Right;  . TOTAL ABDOMINAL HYSTERECTOMY  1963   for cervical cancer in situ    No family history on file.  Allergies  Allergen Reactions  . Morphine And Related Itching    Short lived, mild itching.    Current Outpatient Prescriptions on File Prior to Visit  Medication Sig Dispense Refill  . aspirin EC 81 MG tablet Take 81 mg by mouth at bedtime.    [name] atorvastatin (LIPITOR) 20 MG tablet TAKE ONE TABLET BY MOUTH IN THE MORNING 90 tablet 3  . Blood Glucose Monitoring Suppl (ONE TOUCH ULTRA MINI) w/Device KIT USE TO CHECK BLOOD SUGAR DAILY AND AS NEEDED 1 each 0  . carboxymethylcellulose (REFRESH PLUS) 0.5 % SOLN Place 1 drop into both eyes 2 (two) times daily.    . Lancets (ONETOUCH ULTRASOFT) lancets Once daily or PRN, Dx E11.9 100 each 12  . lisinopril (PRINIVIL,ZESTRIL) 20 MG tablet Take 1 tablet (20 mg total) by mouth 2 (two) times daily. 180 tablet 3  . metFORMIN (GLUCOPHAGE) 500 MG tablet TAKE ONE TABLET BY MOUTH TWICE DAILY WITH FOOD 180 tablet 1  . Multiple Vitamins-Minerals (WOMENS 50+ MULTI VITAMIN/MIN  PO) Take 1 tablet by mouth daily with lunch.     [name] omeprazole (PRILOSEC) 20 MG capsule TAKE ONE CAPSULE BY MOUTH ONCE DAILY 30 capsule 3  . ONE TOUCH ULTRA TEST test strip USE  STRIP TO CHECK GLUCOSE ONCE DAILY AS DIRECTED 100 each 1  . ONETOUCH DELICA LANCETS 16X MISC USE TO CHECK BLOOD SUGAR ONCE DAILY 100 each 4  . TURMERIC CURCUMIN PO Take 1 capsule by mouth daily with lunch.      No current facility-administered medications on file prior to visit.     BP 138/68 (BP Location: Left Arm, Patient Position: Sitting, Cuff Size: Normal)   Pulse 86   Temp 98.2 F (36.8 C) (Oral)   Ht _0  (1.422 m)   Wt 100 lb 3.2 oz (45.5 kg)   SpO2 98%   BMI 22.46 kg/m     Review  of Systems  Constitutional: Negative.   HENT: Negative for congestion, dental problem, hearing loss, rhinorrhea, sinus pressure, sore throat and tinnitus.   Eyes: Negative for pain, discharge and visual disturbance.  Respiratory: Negative for cough and shortness of breath.   Cardiovascular: Negative for chest pain, palpitations and leg swelling.  Gastrointestinal: Negative for abdominal distention, abdominal pain, blood in stool, constipation, diarrhea, nausea and vomiting.  Genitourinary: Negative for difficulty urinating, dysuria, flank pain, frequency, hematuria, pelvic pain, urgency, vaginal bleeding, vaginal discharge and vaginal pain.  Musculoskeletal: Negative for arthralgias, gait problem and joint swelling.  Skin: Negative for rash.  Neurological: Negative for dizziness, syncope, speech difficulty, weakness, numbness and headaches.  Hematological: Negative for adenopathy.  Psychiatric/Behavioral: Negative for agitation, behavioral problems and dysphoric mood. The patient is not nervous/anxious.        Objective:   Physical Exam  Constitutional: She is oriented to person, place, and time. She appears well-developed and well-nourished.  HENT:  Head: Normocephalic.  Right Ear: External ear normal.  Left Ear: External ear normal.  Mouth/Throat: Oropharynx is clear and moist.  Eyes: Conjunctivae and EOM are normal. Pupils are equal, round, and reactive to light.  Neck: Normal range of motion. Neck supple. No thyromegaly present.  Cardiovascular: Normal rate, regular rhythm, normal heart sounds and intact distal pulses.   Pulmonary/Chest: Effort normal. She has rales.  Abdominal: Soft. Bowel sounds are normal. She exhibits no mass. There is no tenderness.  Musculoskeletal: Normal range of motion.  Lymphadenopathy:    She has no cervical adenopathy.  Neurological: She is alert and oriented to person, place, and time.  Skin: Skin is warm and dry. No rash noted.  Psychiatric: She  has a normal mood and affect. Her behavior is normal.          Assessment & Plan:   Diabetes mellitus.  Hemoglobin A1c improved to 7.0.  No change in therapy Essential hypertension, stable  Dyslipidemia.  Continue low intensity statin therapyTobacco use.  Total smoking cessation encouraged  Needs annual eye examination.  This was recommended Carotid artery disease, stable  Follow-up 4 months  Miki Blank Pilar Plate

## 2017-05-23 NOTE — Patient Instructions (Signed)
Limit your sodium (Salt) intake  Please check your blood pressure on a regular basis.  If it is consistently greater than 150/90, please make an office appointment.   Please check your hemoglobin A1c every 3-6  Months  Enjoy your [place] cruise!!

## 2017-06-18 ENCOUNTER — Other Ambulatory Visit: Payer: Self-pay | Admitting: *Deleted

## 2017-06-18 NOTE — Patient Outreach (Signed)
[facility]) Care Management  06/18/2017  [name] [birth year] [id number]   HTA High Risk Screen  RN attempted to reach pt today however unsuccessful. Will continue outreach attempts accordingly.  Raina Mina, RN Care Management Coordinator [place] Office [phone]

## 2017-06-23 ENCOUNTER — Other Ambulatory Visit: Payer: Self-pay | Admitting: Internal Medicine

## 2017-06-30 ENCOUNTER — Other Ambulatory Visit: Payer: Self-pay | Admitting: *Deleted

## 2017-06-30 NOTE — Patient Outreach (Signed)
[facility]) Care Management  06/30/2017  [name] Aug 29, 1934 [id number]   HTA- Screening Second outreach attempt unsuccessful. Will attempt once again to reach pt for screening.  Raina Mina, RN Care Management Coordinator [place] Office [phone]

## 2017-07-04 ENCOUNTER — Other Ambulatory Visit: Payer: Self-pay | Admitting: *Deleted

## 2017-07-04 NOTE — Patient Outreach (Addendum)
[facility]) Care Management  07/04/2017  [name] Dec 31, 1933 [id number]   HTA Screening  3rd outreach attempt unsuccessful for screening.   Raina Mina, RN Care Management Coordinator [place] Office [phone]

## 2017-07-15 DIAGNOSIS — H26491 Other secondary cataract, right eye: Secondary | ICD-10-CM | POA: Diagnosis not present

## 2017-07-15 DIAGNOSIS — Z961 Presence of intraocular lens: Secondary | ICD-10-CM | POA: Diagnosis not present

## 2017-07-15 DIAGNOSIS — H5310 Unspecified subjective visual disturbances: Secondary | ICD-10-CM | POA: Diagnosis not present

## 2017-08-18 ENCOUNTER — Ambulatory Visit (INDEPENDENT_AMBULATORY_CARE_PROVIDER_SITE_OTHER): Payer: PPO | Admitting: Internal Medicine

## 2017-08-18 ENCOUNTER — Encounter: Payer: Self-pay | Admitting: Internal Medicine

## 2017-08-18 VITALS — BP 132/74 | HR 107 | Temp 97.7°F | Wt 98.0 lb

## 2017-08-18 DIAGNOSIS — J209 Acute bronchitis, unspecified: Secondary | ICD-10-CM | POA: Diagnosis not present

## 2017-08-18 DIAGNOSIS — J440 Chronic obstructive pulmonary disease with acute lower respiratory infection: Secondary | ICD-10-CM

## 2017-08-18 DIAGNOSIS — E119 Type 2 diabetes mellitus without complications: Secondary | ICD-10-CM

## 2017-08-18 DIAGNOSIS — I10 Essential (primary) hypertension: Secondary | ICD-10-CM | POA: Diagnosis not present

## 2017-08-18 MED ORDER — AZITHROMYCIN 250 MG PO TABS: ORAL_TABLET | ORAL | 0 refills | Status: DC

## 2017-08-18 NOTE — Patient Instructions (Addendum)
Chronic Obstructive Pulmonary Disease Exacerbation Chronic obstructive pulmonary disease (COPD) is a common lung problem. In COPD, the flow of air from the lungs is limited. COPD exacerbations are times that breathing gets worse and you need extra treatment. Without treatment they can be life threatening. If they happen often, your lungs can become more damaged. If your COPD gets worse, your doctor may treat you with:  Medicines.  Oxygen.  Different ways to clear your airway, such as using a mask.  Follow these instructions at home:  Do not smoke.  Avoid tobacco smoke and other things that bother your lungs.  If given, take your antibiotic medicine as told. Finish the medicine even if you start to feel better.  Only take medicines as told by your doctor.  Drink enough fluids to keep your pee (urine) clear or pale yellow (unless your doctor has told you not to).  Use a cool mist machine (vaporizer).  If you use oxygen or a machine that turns liquid medicine into a mist (nebulizer), continue to use them as told.  Keep up with shots (vaccinations) as told by your doctor.  Exercise regularly.  Eat healthy foods.  Keep all doctor visits as told. Get help right away if:  You are very short of breath and it gets worse.  You have trouble talking.  You have bad chest pain.  You have blood in your spit (sputum).  You have a fever.  You keep throwing up (vomiting).  You feel weak, or you pass out (faint).  You feel confused.  You keep getting worse.   Take over-the-counter expectorants and cough medications such as  Mucinex DM.  Call if there is no improvement in 5 to 7 days or if  you develop worsening cough, fever, or new symptoms, such as shortness of breath or chest pain.

## 2017-08-18 NOTE — Progress Notes (Signed)
Subjective:    Patient ID: [name], female    DOB: Mar 12, 1934, 83 y.o.   MRN: [id number]  HPI  Lab Results  Component Value Date   HGBA1C 7.0 05/23/2017   83 year old patient who has a history of 2 pack per day smoking.  She presents with a one-week history of increasing cough and congestion.  She has become anorexic and is eating very little.  She does have type 2 diabetes, control with metformin therapy only. She has essential hypertension. Denies any wheezing.  Does have chronic DOE  Past Medical History:  Diagnosis Date  . Diabetes mellitus, type 2 ([place])   . History of cervical cancer   . Hyperlipidemia   . Hypertension   . Raynaud's phenomenon   . TN (trigeminal neuralgia)      Social History   Social History  . Marital status: Married    Spouse name: [name]  . Number of children: 1  . Years of education: N/A   Occupational History  . Unemployed Not Employed   Social History Main Topics  . Smoking status: Current Every Day Smoker    Packs/day: 1.50    Years: 60.00    Types: Cigarettes  . Smokeless tobacco: Never Used  . Alcohol use No  . Drug use: No  . Sexual activity: Not on file   Other Topics Concern  . Not on file   Social History Narrative   Married.  Lives in [place] with her husband.  No FH of heart disease.    Past Surgical History:  Procedure Laterality Date  . BREAST BIOPSY    . BREAST ENHANCEMENT SURGERY    . CHOLECYSTECTOMY N/A 02/14/2014   Procedure: LAPAROSCOPIC CHOLECYSTECTOMY;  Surgeon: Ralene Ok, MD;  Location: [facility];  Service: General;  Laterality: N/A;  . ERCP N/A 03/08/2015   Procedure: ENDOSCOPIC RETROGRADE CHOLANGIOPANCREATOGRAPHY (ERCP);  Surgeon: Ladene Artist, MD;  Location: [facility];  Service: Endoscopy;  Laterality: N/A;  . MASS EXCISION Right 12/25/2016   Procedure: EXCISION RIGHT SCALP MASS SEBACEOUS CYST;  Surgeon: Coralie Keens, MD;  Location: [facility];  Service: General;  Laterality: Right;    . TOTAL ABDOMINAL HYSTERECTOMY  1963   for cervical cancer in situ    No family history on file.  Allergies  Allergen Reactions  . Morphine And Related Itching    Short lived, mild itching.    Current Outpatient Prescriptions on File Prior to Visit  Medication Sig Dispense Refill  . aspirin EC 81 MG tablet Take 81 mg by mouth at bedtime.    [name] atorvastatin (LIPITOR) 20 MG tablet TAKE ONE TABLET BY MOUTH IN THE MORNING 90 tablet 3  . Blood Glucose Monitoring Suppl (ONE TOUCH ULTRA MINI) w/Device KIT USE TO CHECK BLOOD SUGAR DAILY AND AS NEEDED 1 each 0  . carboxymethylcellulose (REFRESH PLUS) 0.5 % SOLN Place 1 drop into both eyes 2 (two) times daily.    . Lancets (ONETOUCH ULTRASOFT) lancets Once daily or PRN, Dx E11.9 100 each 12  . lisinopril (PRINIVIL,ZESTRIL) 20 MG tablet Take 1 tablet (20 mg total) by mouth 2 (two) times daily. 180 tablet 3  . metFORMIN (GLUCOPHAGE) 500 MG tablet TAKE ONE TABLET BY MOUTH TWICE DAILY WITH FOOD 180 tablet 1  . Multiple Vitamins-Minerals (WOMENS 50+ MULTI VITAMIN/MIN PO) Take 1 tablet by mouth daily with lunch.     [name] omeprazole (PRILOSEC) 20 MG capsule TAKE ONE CAPSULE BY MOUTH ONCE DAILY 30 capsule 3  . ONE TOUCH  ULTRA TEST test strip USE  STRIP TO CHECK GLUCOSE ONCE DAILY AS DIRECTED 100 each 1  . ONETOUCH DELICA LANCETS 62B MISC USE TO CHECK BLOOD SUGAR ONCE DAILY 100 each 4  . TURMERIC CURCUMIN PO Take 1 capsule by mouth daily with lunch.      No current facility-administered medications on file prior to visit.     BP 132/74 (BP Location: Right Arm, Patient Position: Sitting, Cuff Size: Normal)   Pulse (!) 107   Temp 97.7 F (36.5 C) (Oral)   Wt 98 lb (44.5 kg)   BMI 21.97 kg/m   Review of Systems  Constitutional: Positive for activity change, appetite change and fatigue.  HENT: Negative for congestion, dental problem, hearing loss, rhinorrhea, sinus pressure, sore throat and tinnitus.   Eyes: Negative for pain, discharge and visual  disturbance.  Respiratory: Positive for cough and shortness of breath. Negative for wheezing.   Cardiovascular: Negative for chest pain, palpitations and leg swelling.  Gastrointestinal: Positive for nausea and vomiting. Negative for abdominal distention, abdominal pain, blood in stool, constipation and diarrhea.  Genitourinary: Negative for difficulty urinating, dysuria, flank pain, frequency, hematuria, pelvic pain, urgency, vaginal bleeding, vaginal discharge and vaginal pain.  Musculoskeletal: Negative for arthralgias, gait problem and joint swelling.  Skin: Negative for rash.  Neurological: Negative for dizziness, syncope, speech difficulty, weakness, numbness and headaches.  Hematological: Negative for adenopathy.  Psychiatric/Behavioral: Negative for agitation, behavioral problems and dysphoric mood. The patient is not nervous/anxious.        Objective:   Physical Exam  Constitutional: She is oriented to person, place, and time. She appears well-developed and well-nourished.  HENT:  Head: Normocephalic.  Right Ear: External ear normal.  Left Ear: External ear normal.  Mouth/Throat: Oropharynx is clear and moist.  Eyes: Pupils are equal, round, and reactive to light. Conjunctivae and EOM are normal.  Neck: Normal range of motion. Neck supple. No thyromegaly present.  Cardiovascular: Normal rate, regular rhythm, normal heart sounds and intact distal pulses.   Pulmonary/Chest: Effort normal.  Few scattered rhonchi.  No distress  Abdominal: Soft. Bowel sounds are normal. She exhibits no mass. There is no tenderness.  Musculoskeletal: Normal range of motion.  Lymphadenopathy:    She has no cervical adenopathy.  Neurological: She is alert and oriented to person, place, and time.  Skin: Skin is warm and dry. No rash noted.  Psychiatric: She has a normal mood and affect. Her behavior is normal.          Assessment & Plan:   COPD exacerbation.  Total smoking cessation  discussed.  Will force fluids.  Treat with expectorants and antibiotic therapy.  We'll hold prednisone therapy at this time Diabetes mellitus.  Follow-up next month as scheduled Hypertension, controlled  Nyoka Cowden

## 2017-08-25 ENCOUNTER — Encounter: Payer: Self-pay | Admitting: Internal Medicine

## 2017-08-25 ENCOUNTER — Ambulatory Visit (INDEPENDENT_AMBULATORY_CARE_PROVIDER_SITE_OTHER): Payer: PPO | Admitting: Internal Medicine

## 2017-08-25 VITALS — BP 132/70 | HR 96 | Temp 98.5°F | Ht <= 58 in | Wt 98.0 lb

## 2017-08-25 DIAGNOSIS — E119 Type 2 diabetes mellitus without complications: Secondary | ICD-10-CM

## 2017-08-25 DIAGNOSIS — J440 Chronic obstructive pulmonary disease with acute lower respiratory infection: Secondary | ICD-10-CM

## 2017-08-25 DIAGNOSIS — J209 Acute bronchitis, unspecified: Secondary | ICD-10-CM | POA: Diagnosis not present

## 2017-08-25 LAB — POCT GLYCOSYLATED HEMOGLOBIN (HGB A1C): HEMOGLOBIN A1C: 6.7

## 2017-08-25 MED ORDER — BENZONATATE 100 MG PO CAPS: 100.0000 mg | ORAL_CAPSULE | Freq: Two times a day (BID) | ORAL | 0 refills | Status: DC | PRN

## 2017-08-25 NOTE — Patient Instructions (Addendum)
Take over-the-counter expectorants and cough medications such as  Mucinex DM.  Call if there is no improvement in 5 to 7 days or if  you develop worsening cough, fever, or new symptoms, such as shortness of breath or chest pain.  Take additional cough medicine as prescribed  Hydrate and Humidify  Drink enough water to keep your urine clear or pale yellow. Staying hydrated will help to thin your mucus.  Use a cool mist humidifier to keep the humidity level in your home above 50%.  Inhale steam for 10-15 minutes, 3-4 times a day or as told by your health care provider. You can do this in the bathroom while a hot shower is running.  Limit your exposure to cool or dry air. Rest  Rest as much as possible.

## 2017-08-25 NOTE — Progress Notes (Signed)
Subjective:    Patient ID: [name], female    DOB: [birth year], 83 y.o.   MRN: [id number]  HPI  Lab Results  Component Value Date   HGBA1C 7.0 05/23/2017   83 year old patient who was seen 1 week ago and treated for an exacerbation of COPD.  She is improved but still having refractory cough.  Cough is nonproductive.  No fever She is seen today for follow-up of type 2 diabetes.  Lab Results  Component Value Date   HGBA1C 6.7 08/25/2017   Unfortunately, she continues to smoke.  There is been no fever.  One week ago.  She was treated with azithromycin.  Past Medical History:  Diagnosis Date  . Diabetes mellitus, type 2 ([place])   . History of cervical cancer   . Hyperlipidemia   . Hypertension   . Raynaud's phenomenon   . TN (trigeminal neuralgia)      Social History   Social History  . Marital status: Married    Spouse name: [name]  . Number of children: 1  . Years of education: N/A   Occupational History  . Unemployed Not Employed   Social History Main Topics  . Smoking status: Current Every Day Smoker    Packs/day: 1.50    Years: 60.00    Types: Cigarettes  . Smokeless tobacco: Never Used  . Alcohol use No  . Drug use: No  . Sexual activity: Not on file   Other Topics Concern  . Not on file   Social History Narrative   Married.  Lives in [place] with her husband.  No FH of heart disease.    Past Surgical History:  Procedure Laterality Date  . BREAST BIOPSY    . BREAST ENHANCEMENT SURGERY    . CHOLECYSTECTOMY N/A 02/14/2014   Procedure: LAPAROSCOPIC CHOLECYSTECTOMY;  Surgeon: Ralene Ok, MD;  Location: [place];  Service: General;  Laterality: N/A;  . ERCP N/A 03/08/2015   Procedure: ENDOSCOPIC RETROGRADE CHOLANGIOPANCREATOGRAPHY (ERCP);  Surgeon: Ladene Artist, MD;  Location: [facility];  Service: Endoscopy;  Laterality: N/A;  . MASS EXCISION Right 12/25/2016   Procedure: EXCISION RIGHT SCALP MASS SEBACEOUS CYST;  Surgeon: Coralie Keens, MD;  Location: [facility];  Service: General;  Laterality: Right;  . TOTAL ABDOMINAL HYSTERECTOMY  1963   for cervical cancer in situ    No family history on file.  Allergies  Allergen Reactions  . Morphine And Related Itching    Short lived, mild itching.    Current Outpatient Prescriptions on File Prior to Visit  Medication Sig Dispense Refill  . aspirin EC 81 MG tablet Take 81 mg by mouth at bedtime.    [name] atorvastatin (LIPITOR) 20 MG tablet TAKE ONE TABLET BY MOUTH IN THE MORNING 90 tablet 3  . Blood Glucose Monitoring Suppl (ONE TOUCH ULTRA MINI) w/Device KIT USE TO CHECK BLOOD SUGAR DAILY AND AS NEEDED 1 each 0  . carboxymethylcellulose (REFRESH PLUS) 0.5 % SOLN Place 1 drop into both eyes 2 (two) times daily.    . Lancets (ONETOUCH ULTRASOFT) lancets Once daily or PRN, Dx E11.9 100 each 12  . lisinopril (PRINIVIL,ZESTRIL) 20 MG tablet Take 1 tablet (20 mg total) by mouth 2 (two) times daily. 180 tablet 3  . metFORMIN (GLUCOPHAGE) 500 MG tablet TAKE ONE TABLET BY MOUTH TWICE DAILY WITH FOOD 180 tablet 1  . Multiple Vitamins-Minerals (WOMENS 50+ MULTI VITAMIN/MIN PO) Take 1 tablet by mouth daily with lunch.     [name] omeprazole (PRILOSEC)  20 MG capsule TAKE ONE CAPSULE BY MOUTH ONCE DAILY 30 capsule 3  . ONE TOUCH ULTRA TEST test strip USE  STRIP TO CHECK GLUCOSE ONCE DAILY AS DIRECTED 100 each 1  . ONETOUCH DELICA LANCETS 08Q MISC USE TO CHECK BLOOD SUGAR ONCE DAILY 100 each 4  . TURMERIC CURCUMIN PO Take 1 capsule by mouth daily with lunch.      No current facility-administered medications on file prior to visit.     BP 132/70 (BP Location: Left Arm, Patient Position: Sitting, Cuff Size: Normal)   Pulse 96   Temp 98.5 F (36.9 C) (Oral)   Ht _0  (1.422 m)   Wt 98 lb (44.5 kg)   SpO2 95%   BMI 21.97 kg/m    Review of Systems  Constitutional: Negative.   HENT: Negative for congestion, dental problem, hearing loss, rhinorrhea, sinus pressure, sore throat and  tinnitus.   Eyes: Negative for pain, discharge and visual disturbance.  Respiratory: Positive for cough. Negative for shortness of breath.   Cardiovascular: Negative for chest pain, palpitations and leg swelling.  Gastrointestinal: Negative for abdominal distention, abdominal pain, blood in stool, constipation, diarrhea, nausea and vomiting.  Genitourinary: Negative for difficulty urinating, dysuria, flank pain, frequency, hematuria, pelvic pain, urgency, vaginal bleeding, vaginal discharge and vaginal pain.  Musculoskeletal: Negative for arthralgias, gait problem and joint swelling.  Skin: Negative for rash.  Neurological: Negative for dizziness, syncope, speech difficulty, weakness, numbness and headaches.  Hematological: Negative for adenopathy.  Psychiatric/Behavioral: Negative for agitation, behavioral problems and dysphoric mood. The patient is not nervous/anxious.        Objective:   Physical Exam  Constitutional: She is oriented to person, place, and time. She appears well-developed and well-nourished.  HENT:  Head: Normocephalic.  Right Ear: External ear normal.  Left Ear: External ear normal.  Mouth/Throat: Oropharynx is clear and moist.  Eyes: Pupils are equal, round, and reactive to light. Conjunctivae and EOM are normal.  Neck: Normal range of motion. Neck supple. No thyromegaly present.  Cardiovascular: Normal rate, regular rhythm, normal heart sounds and intact distal pulses.   Pulmonary/Chest: Effort normal and breath sounds normal. No respiratory distress. She has no wheezes. She has no rales.  Abdominal: Soft. Bowel sounds are normal. She exhibits no mass. There is no tenderness.  Musculoskeletal: Normal range of motion.  Lymphadenopathy:    She has no cervical adenopathy.  Neurological: She is alert and oriented to person, place, and time.  Skin: Skin is warm and dry. No rash noted.  Psychiatric: She has a normal mood and affect. Her behavior is normal.           Assessment & Plan:   Resolving COPD exacerbation.  Will review a chest x-ray.  Continue expectorants and fluids Diabetes mellitus stable  Follow-up 6 months No change in medical regimen  Nyoka Cowden

## 2017-08-26 ENCOUNTER — Ambulatory Visit (INDEPENDENT_AMBULATORY_CARE_PROVIDER_SITE_OTHER): Payer: PPO | Admitting: Emergency Medicine

## 2017-08-26 DIAGNOSIS — Z23 Encounter for immunization: Secondary | ICD-10-CM

## 2017-09-08 ENCOUNTER — Ambulatory Visit (INDEPENDENT_AMBULATORY_CARE_PROVIDER_SITE_OTHER)
Admission: RE | Admit: 2017-09-08 | Discharge: 2017-09-08 | Disposition: A | Discharge status: Home / Self Care | Payer: PPO | Source: Ambulatory Visit | Attending: Internal Medicine | Admitting: Internal Medicine

## 2017-09-08 DIAGNOSIS — J440 Chronic obstructive pulmonary disease with acute lower respiratory infection: Secondary | ICD-10-CM | POA: Diagnosis not present

## 2017-09-08 DIAGNOSIS — J209 Acute bronchitis, unspecified: Secondary | ICD-10-CM

## 2017-09-08 DIAGNOSIS — R05 Cough: Secondary | ICD-10-CM | POA: Diagnosis not present

## 2017-09-08 DIAGNOSIS — R079 Chest pain, unspecified: Secondary | ICD-10-CM | POA: Diagnosis not present

## 2017-09-09 ENCOUNTER — Encounter: Payer: Self-pay | Admitting: Internal Medicine

## 2017-09-09 ENCOUNTER — Other Ambulatory Visit: Payer: Self-pay | Admitting: Internal Medicine

## 2017-09-15 ENCOUNTER — Other Ambulatory Visit: Payer: Self-pay | Admitting: *Deleted

## 2017-09-15 DIAGNOSIS — I739 Peripheral vascular disease, unspecified: Principal | ICD-10-CM

## 2017-09-15 DIAGNOSIS — I779 Disorder of arteries and arterioles, unspecified: Secondary | ICD-10-CM

## 2017-09-22 ENCOUNTER — Ambulatory Visit: Payer: PPO | Admitting: Internal Medicine

## 2017-09-22 DIAGNOSIS — Z0289 Encounter for other administrative examinations: Secondary | ICD-10-CM

## 2017-09-30 ENCOUNTER — Encounter: Payer: Self-pay | Admitting: Internal Medicine

## 2017-09-30 ENCOUNTER — Ambulatory Visit: Payer: PPO | Admitting: Internal Medicine

## 2017-09-30 VITALS — BP 132/70 | Temp 97.8°F | Ht <= 58 in | Wt 97.6 lb

## 2017-09-30 DIAGNOSIS — I10 Essential (primary) hypertension: Secondary | ICD-10-CM | POA: Diagnosis not present

## 2017-09-30 DIAGNOSIS — H8110 Benign paroxysmal vertigo, unspecified ear: Secondary | ICD-10-CM | POA: Diagnosis not present

## 2017-09-30 MED ORDER — MECLIZINE HCL 25 MG PO TABS: 25.0000 mg | ORAL_TABLET | Freq: Three times a day (TID) | ORAL | 0 refills | Status: DC | PRN

## 2017-09-30 NOTE — Progress Notes (Signed)
Subjective:    Patient ID: [name], female    DOB: 08-Jun-1934, 83 y.o.   MRN: [id number]  HPI  83 year old patient who presents with a 3-day history of dizziness nausea.  Symptoms are aggravated by movement and alleviated by rest.  Denies any prior history of vertigo.  She does describe a spinning sensation with movement.  She states she has improved somewhat today denies any fever or URI symptoms. She does have a history of type 2 diabetes and dyslipidemia as well as essential hypertension. No focal neurological symptoms  Past Medical History:  Diagnosis Date  . Diabetes mellitus, type 2 ([place])   . History of cervical cancer   . Hyperlipidemia   . Hypertension   . Raynaud's phenomenon   . TN (trigeminal neuralgia)      Social History   Socioeconomic History  . Marital status: Married    Spouse name: [name]  . Number of children: 1  . Years of education: Not on file  . Highest education level: Not on file  Social Needs  . Financial resource strain: Not on file  . Food insecurity - worry: Not on file  . Food insecurity - inability: Not on file  . Transportation needs - medical: Not on file  . Transportation needs - non-medical: Not on file  Occupational History  . 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 Smoking status: Current Every Day Smoker    Packs/day: 1.50    Years: 60.00    Pack years: 90.00    Types: Cigarettes  . Smokeless tobacco: Never Used  Substance and Sexual Activity  . Alcohol use: No  . Drug use: No  . Sexual activity: Not on file  Other Topics Concern  . Not on file  Social History Narrative   Married.  Lives in [place] with her husband.  No FH of heart disease.    Past Surgical History:  Procedure Laterality Date  . BREAST BIOPSY    . BREAST ENHANCEMENT SURGERY    . TOTAL ABDOMINAL HYSTERECTOMY  1963   for cervical cancer in situ    History reviewed. No pertinent family history.  Allergies    Allergen Reactions  . Morphine And Related Itching    Short lived, mild itching.    Current Outpatient Medications on File Prior to Visit  Medication Sig Dispense Refill  . aspirin EC 81 MG tablet Take 81 mg by mouth at bedtime.    [name] atorvastatin (LIPITOR) 20 MG tablet TAKE ONE TABLET BY MOUTH IN THE MORNING 90 tablet 3  . Blood Glucose Monitoring Suppl (ONE TOUCH ULTRA MINI) w/Device KIT USE TO CHECK BLOOD SUGAR DAILY AND AS NEEDED 1 each 0  . carboxymethylcellulose (REFRESH PLUS) 0.5 % SOLN Place 1 drop into both eyes 2 (two) times daily.    . Lancets (ONETOUCH ULTRASOFT) lancets Once daily or PRN, Dx E11.9 100 each 12  . lisinopril (PRINIVIL,ZESTRIL) 20 MG tablet TAKE ONE TABLET BY MOUTH TWICE DAILY 180 tablet 1  . metFORMIN (GLUCOPHAGE) 500 MG tablet TAKE ONE TABLET BY MOUTH TWICE DAILY WITH FOOD 180 tablet 1  . Multiple Vitamins-Minerals (WOMENS 50+ MULTI VITAMIN/MIN PO) Take 1 tablet by mouth daily with lunch.     [name] omeprazole (PRILOSEC) 20 MG capsule TAKE ONE CAPSULE BY MOUTH ONCE DAILY 30 capsule 3  . ONE TOUCH ULTRA TEST test strip USE  STRIP TO CHECK GLUCOSE ONCE DAILY AS DIRECTED 100 each 1  . ONETOUCH  DELICA LANCETS 16L MISC USE TO CHECK BLOOD SUGAR ONCE DAILY 100 each 4  . TURMERIC CURCUMIN PO Take 1 capsule by mouth daily with lunch.      No current facility-administered medications on file prior to visit.     BP 132/70 (BP Location: Left Arm, Patient Position: Sitting, Cuff Size: Normal)   Temp 97.8 F (36.6 C) (Oral)   Ht 4' 8" (1.422 m)   Wt 97 lb 9.6 oz (44.3 kg)   BMI 21.88 kg/m     Review of Systems  Constitutional: Negative.   HENT: Negative for congestion, dental problem, hearing loss, rhinorrhea, sinus pressure, sore throat and tinnitus.   Eyes: Negative for pain, discharge and visual disturbance.  Respiratory: Negative for cough and shortness of breath.   Cardiovascular: Negative for chest pain, palpitations and leg swelling.  Gastrointestinal:  Positive for nausea. Negative for abdominal distention, abdominal pain, blood in stool, constipation, diarrhea and vomiting.  Genitourinary: Negative for difficulty urinating, dysuria, flank pain, frequency, hematuria, pelvic pain, urgency, vaginal bleeding, vaginal discharge and vaginal pain.  Musculoskeletal: Negative for arthralgias, gait problem and joint swelling.  Skin: Negative for rash.  Neurological: Positive for dizziness and light-headedness. Negative for syncope, speech difficulty, weakness, numbness and headaches.  Hematological: Negative for adenopathy.  Psychiatric/Behavioral: Negative for agitation, behavioral problems and dysphoric mood. The patient is not nervous/anxious.        Objective:   Physical Exam  Constitutional: She is oriented to person, place, and time. She appears well-developed and well-nourished. No distress.  HENT:  Head: Normocephalic.  Right Ear: External ear normal.  Left Ear: External ear normal.  Mouth/Throat: Oropharynx is clear and moist.  Eyes: Conjunctivae and EOM are normal. Pupils are equal, round, and reactive to light.  Neck: Normal range of motion. Neck supple.  Cardiovascular: Normal rate and regular rhythm.  Pulmonary/Chest: Effort normal and breath sounds normal.  Neurological: She is alert and oriented to person, place, and time. She has normal reflexes. No cranial nerve deficit. Coordination normal.  Pupillary response is normal.  Extraocular muscles intact No facial asymmetry Normal finger to nose testing No drift Sensory exam normal Reflexes symmetrical Normal gait          Assessment & Plan:   Benign paroxysmal vertigo.  We will treat with meclizine.  Patient information dispensed.  She has improved Essential hypertension stable Diabetes mellitus Dyslipidemia Ongoing tobacco use   Nyoka Cowden

## 2017-09-30 NOTE — Patient Instructions (Addendum)
Call or return to clinic prn if these symptoms worsen or fail to improve as anticipated.     Benign Positional Vertigo Vertigo is the feeling that you or your surroundings are moving when they are not. Benign positional vertigo is the most common form of vertigo. The cause of this condition is not serious (is benign). This condition is triggered by certain movements and positions (is positional). This condition can be dangerous if it occurs while you are doing something that could endanger you or others, such as driving. What are the causes? In many cases, the cause of this condition is not known. It may be caused by a disturbance in an area of the inner ear that helps your brain to sense movement and balance. This disturbance can be caused by a viral infection (labyrinthitis), head injury, or repetitive motion. What increases the risk? This condition is more likely to develop in:  Women.  People who are 73 years of age or older.  What are the signs or symptoms? Symptoms of this condition usually happen when you move your head or your eyes in different directions. Symptoms may start suddenly, and they usually last for less than a minute. Symptoms may include:  Loss of balance and falling.  Feeling like you are spinning or moving.  Feeling like your surroundings are spinning or moving.  Nausea and vomiting.  Blurred vision.  Dizziness.  Involuntary eye movement (nystagmus).  Symptoms can be mild and cause only slight annoyance, or they can be severe and interfere with daily life. Episodes of benign positional vertigo may return (recur) over time, and they may be triggered by certain movements. Symptoms may improve over time. How is this diagnosed? This condition is usually diagnosed by medical history and a physical exam of the head, neck, and ears. You may be referred to a health care provider who specializes in ear, nose, and throat (ENT) problems (otolaryngologist) or a provider  who specializes in disorders of the nervous system (neurologist). You may have additional testing, including:  MRI.  A CT scan.  Eye movement tests. Your health care provider may ask you to change positions quickly while he or she watches you for symptoms of benign positional vertigo, such as nystagmus. Eye movement may be tested with an electronystagmogram (ENG), caloric stimulation, the Dix-Hallpike test, or the roll test.  An electroencephalogram (EEG). This records electrical activity in your brain.  Hearing tests.  How is this treated? Usually, your health care provider will treat this by moving your head in specific positions to adjust your inner ear back to normal. Surgery may be needed in severe cases, but this is rare. In some cases, benign positional vertigo may resolve on its own in 2-4 weeks. Follow these instructions at home: Safety  Move slowly.Avoid sudden body or head movements.  Avoid driving.  Avoid operating heavy machinery.  Avoid doing any tasks that would be dangerous to you or others if a vertigo episode would occur.  If you have trouble walking or keeping your balance, try using a cane for stability. If you feel dizzy or unstable, sit down right away.  Return to your normal activities as told by your health care provider. Ask your health care provider what activities are safe for you. General instructions  Take over-the-counter and prescription medicines only as told by your health care provider.  Avoid certain positions or movements as told by your health care provider.  Drink enough fluid to keep your urine clear or pale  yellow.  Keep all follow-up visits as told by your health care provider. This is important. Contact a health care provider if:  You have a fever.  Your condition gets worse or you develop new symptoms.  Your family or friends notice any behavioral changes.  Your nausea or vomiting gets worse.  You have numbness or a "pins and  needles" sensation. Get help right away if:  You have difficulty speaking or moving.  You are always dizzy.  You faint.  You develop severe headaches.  You have weakness in your legs or arms.  You have changes in your hearing or vision.  You develop a stiff neck.  You develop sensitivity to light. This information is not intended to replace advice given to you by your health care provider. Make sure you discuss any questions you have with your health care provider. Document Released: 08/19/2006 Document Revised: 04/18/2016 Document Reviewed: 03/06/2015 Elsevier Interactive Patient Education  [employer].

## 2017-10-02 ENCOUNTER — Ambulatory Visit (HOSPITAL_COMMUNITY)
Admission: RE | Admit: 2017-10-02 | Payer: PPO | Source: Ambulatory Visit | Attending: Internal Medicine | Admitting: Internal Medicine

## 2017-10-29 ENCOUNTER — Other Ambulatory Visit: Payer: Self-pay | Admitting: Internal Medicine

## 2017-11-19 ENCOUNTER — Ambulatory Visit: Payer: Self-pay

## 2017-11-19 NOTE — Telephone Encounter (Signed)
Phone call from pt. to schedule an appt. with PCP, due to continued c/o dizziness.  Stated she feels dizzy when going from laying to standing position.  Denied feeling like she could pass out.  Denied feeling the room spinning or tilting.  Stated she saw the doctor a couple months ago, and is almost out of her medication.  Denied dizziness at this time.  Stated it occurs mostly at night.  Denied feeling any heart palpitations or shortness of breath, associated with the dizziness.  Did state she occasionally has pain in her left chest; denied has any radiation of the pain in chest.  Denied any chest pain at this time.  Care advice given per protocol.  Appt. Given for 11/24/17.  Advised to call back if symptoms worsen.  Verb understanding.             Reason for Disposition . [1] MODERATE dizziness (e.g., interferes with normal activities) AND [2] has been evaluated by physician for this  Answer Assessment - Initial Assessment Questions 1. DESCRIPTION: "Describe your dizziness."     "so dizzy when I stand up from laying down, especially at night" 2. LIGHTHEADED: "Do you feel lightheaded?" (e.g., somewhat faint, woozy, weak upon standing)     "so dizzy" 3. VERTIGO: "Do you feel like either you or the room is spinning or tilting?" (i.e. vertigo)     No 4. SEVERITY: "How bad is it?"  "Do you feel like you are going to faint?" "Can you stand and walk?"   - MILD - walking normally   - MODERATE - interferes with normal activities (e.g., work, school)    - SEVERE - unable to stand, requires support to walk, feels like passing out now.      Mild, intermittently 5. ONSET:  "When did the dizziness begin?"     Several months 6. AGGRAVATING FACTORS: "Does anything make it worse?" (e.g., standing, change in head position)     Aggravated by standing after she has been laying awhile. 7. HEART RATE: "Can you tell me your heart rate?" "How many beats in 15 seconds?"  (Note: not all patients can do this)    Unsure  8. CAUSE: "What do you think is causing the dizziness?"     unknown 9. RECURRENT SYMPTOM: "Have you had dizziness before?" If so, ask: "When was the last time?" "What happened that time?"    Has been occurring over 2 months. 10. OTHER SYMPTOMS: "Do you have any other symptoms?" (e.g., fever, chest pain, vomiting, diarrhea, bleeding)       Occasional chest pain on left side of chest.   11. PREGNANCY: "Is there any chance you are pregnant?" "When was your last menstrual cycle  n/a  Protocols used: DIZZINESS Heidi Dach

## 2017-11-24 ENCOUNTER — Encounter: Payer: Self-pay | Admitting: Internal Medicine

## 2017-11-24 ENCOUNTER — Ambulatory Visit: Payer: PPO | Admitting: Internal Medicine

## 2017-11-24 VITALS — BP 118/62 | HR 92 | Temp 97.5°F | Ht <= 58 in | Wt 101.6 lb

## 2017-11-24 DIAGNOSIS — Z720 Tobacco use: Secondary | ICD-10-CM | POA: Diagnosis not present

## 2017-11-24 DIAGNOSIS — I10 Essential (primary) hypertension: Secondary | ICD-10-CM | POA: Diagnosis not present

## 2017-11-24 DIAGNOSIS — I6522 Occlusion and stenosis of left carotid artery: Secondary | ICD-10-CM | POA: Diagnosis not present

## 2017-11-24 DIAGNOSIS — E119 Type 2 diabetes mellitus without complications: Secondary | ICD-10-CM

## 2017-11-24 DIAGNOSIS — E0590 Thyrotoxicosis, unspecified without thyrotoxic crisis or storm: Secondary | ICD-10-CM

## 2017-11-24 MED ORDER — OMEPRAZOLE 20 MG PO CPDR: 20.0000 mg | DELAYED_RELEASE_CAPSULE | Freq: Every day | ORAL | 3 refills | Status: DC

## 2017-11-24 MED ORDER — MECLIZINE HCL 25 MG PO TABS: ORAL_TABLET | ORAL | 4 refills | Status: DC

## 2017-11-24 NOTE — Progress Notes (Signed)
Subjective:    Patient ID: [name], female    DOB: May 12, 1934, 83 y.o.   MRN: [id number]  HPI  83 year old patient who is seen today with 2 concerns.  She is accompanied by her son  She was seen last month with positional vertigo.  This has persisted she seemed to have some improvement with meclizine symptoms are worse in the morning when she first sits from a supine position.  She also describes some unsteadiness with her gait throughout the day. She has limited activities such as participation at her health club due to the unsteadiness of gait and a general sense of unwellness. She also describes some chest pain that usually occurs at night in a supine position.  She describes this as an occasional tightness.  She was prescribed omeprazole recently with improvement in the pain she is quite active with housework and also at her health club and has no exertional symptoms She continues to smoke and has a fairly high caffeine load  Past Medical History:  Diagnosis Date  . Diabetes mellitus, type 2 ([place])   . History of cervical cancer   . Hyperlipidemia   . Hypertension   . Raynaud's phenomenon   . TN (trigeminal neuralgia)      Social History   Socioeconomic History  . Marital status: Married    Spouse name: [name]  . Number of children: 1  . Years of education: Not on file  . Highest education level: Not on file  Social Needs  . Financial resource strain: Not on file  . Food insecurity - worry: Not on file  . Food insecurity - inability: Not on file  . Transportation needs - medical: Not on file  . Transportation needs - non-medical: Not on file  Occupational History  . 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 Smoking status: Current Every Day Smoker    Packs/day: 1.50    Years: 60.00    Pack years: 90.00    Types: Cigarettes  . Smokeless tobacco: Never Used  Substance and Sexual Activity  . Alcohol use: No  . Drug use: No  .  Sexual activity: Not on file  Other Topics Concern  . Not on file  Social History Narrative   Married.  Lives in [place] with her husband.  No FH of heart disease.    Past Surgical History:  Procedure Laterality Date  . BREAST BIOPSY    . BREAST ENHANCEMENT SURGERY    . CHOLECYSTECTOMY N/A 02/14/2014   Procedure: LAPAROSCOPIC CHOLECYSTECTOMY;  Surgeon: Ralene Ok, MD;  Location: [facility];  Service: General;  Laterality: N/A;  . ERCP N/A 03/08/2015   Procedure: ENDOSCOPIC RETROGRADE CHOLANGIOPANCREATOGRAPHY (ERCP);  Surgeon: Ladene Artist, MD;  Location: [facility];  Service: Endoscopy;  Laterality: N/A;  . MASS EXCISION Right 12/25/2016   Procedure: EXCISION RIGHT SCALP MASS SEBACEOUS CYST;  Surgeon: Coralie Keens, MD;  Location: [facility];  Service: General;  Laterality: Right;  . TOTAL ABDOMINAL HYSTERECTOMY  1963   for cervical cancer in situ    History reviewed. No pertinent family history.  Allergies  Allergen Reactions  . Morphine And Related Itching    Short lived, mild itching.    Current Outpatient Medications on File Prior to Visit  Medication Sig Dispense Refill  . aspirin EC 81 MG tablet Take 81 mg by mouth at bedtime.    [name] atorvastatin (LIPITOR) 20 MG tablet TAKE ONE TABLET BY MOUTH IN THE MORNING  90 tablet 3  . Blood Glucose Monitoring Suppl (ONE TOUCH ULTRA MINI) w/Device KIT USE TO CHECK BLOOD SUGAR DAILY AND AS NEEDED 1 each 0  . carboxymethylcellulose (REFRESH PLUS) 0.5 % SOLN Place 1 drop into both eyes 2 (two) times daily.    . Lancets (ONETOUCH ULTRASOFT) lancets Once daily or PRN, Dx E11.9 100 each 12  . lisinopril (PRINIVIL,ZESTRIL) 20 MG tablet TAKE ONE TABLET BY MOUTH TWICE DAILY 180 tablet 1  . meclizine (ANTIVERT) 25 MG tablet TAKE 1 TABLET BY MOUTH THREE TIMES DAILY AS NEEDED FOR DIZZINESS 30 tablet 0  . metFORMIN (GLUCOPHAGE) 500 MG tablet TAKE ONE TABLET BY MOUTH TWICE DAILY WITH FOOD 180 tablet 1  . Multiple Vitamins-Minerals (WOMENS 50+  MULTI VITAMIN/MIN PO) Take 1 tablet by mouth daily with lunch.     . ONE TOUCH ULTRA TEST test strip USE  STRIP TO CHECK GLUCOSE ONCE DAILY AS DIRECTED 100 each 1  . ONETOUCH DELICA LANCETS 82N MISC USE TO CHECK BLOOD SUGAR ONCE DAILY 100 each 4  . TURMERIC CURCUMIN PO Take 1 capsule by mouth daily with lunch.      No current facility-administered medications on file prior to visit.     BP 118/62 (BP Location: Left Arm, Patient Position: Sitting, Cuff Size: Normal)   Pulse 92   Temp (!) 97.5 F (36.4 C) (Oral)   Ht '4\' 8"'  (1.422 m)   Wt 101 lb 9.6 oz (46.1 kg)   BMI 22.78 kg/m      Review of Systems  Constitutional: Positive for activity change and appetite change.  HENT: Negative for congestion, dental problem, hearing loss, rhinorrhea, sinus pressure, sore throat and tinnitus.   Eyes: Negative for pain, discharge and visual disturbance.  Respiratory: Negative for cough and shortness of breath.   Cardiovascular: Positive for chest pain. Negative for palpitations and leg swelling.  Gastrointestinal: Negative for abdominal distention, abdominal pain, blood in stool, constipation, diarrhea, nausea and vomiting.  Genitourinary: Negative for difficulty urinating, dysuria, flank pain, frequency, hematuria, pelvic pain, urgency, vaginal bleeding, vaginal discharge and vaginal pain.  Musculoskeletal: Negative for arthralgias, gait problem and joint swelling.  Skin: Negative for rash.  Neurological: Positive for dizziness and light-headedness. Negative for syncope, speech difficulty, weakness, numbness and headaches.  Hematological: Negative for adenopathy.  Psychiatric/Behavioral: Negative for agitation, behavioral problems and dysphoric mood. The patient is not nervous/anxious.        Objective:   Physical Exam  Constitutional: She is oriented to person, place, and time. She appears well-developed and well-nourished.  HENT:  Head: Normocephalic.  Right Ear: External ear normal.    Left Ear: External ear normal.  Mouth/Throat: Oropharynx is clear and moist.  Eyes: Conjunctivae and EOM are normal. Pupils are equal, round, and reactive to light.  Neck: Normal range of motion. Neck supple. No thyromegaly present.  Cardiovascular: Normal rate, regular rhythm, normal heart sounds and intact distal pulses.  Pulmonary/Chest: Effort normal and breath sounds normal.  Abdominal: Soft. Bowel sounds are normal. She exhibits no mass. There is no tenderness.  Musculoskeletal: Normal range of motion.  Lymphadenopathy:    She has no cervical adenopathy.  Neurological: She is alert and oriented to person, place, and time. She has normal reflexes. No cranial nerve deficit. Coordination normal.  Normal finger to nose;  normal heel to shin testing  Skin: Skin is warm and dry. No rash noted.  Psychiatric: She has a normal mood and affect. Her behavior is normal.  Assessment & Plan:   Nonischemic chest pain.  Symptoms are most compatible with GERD.  Continue omeprazole.  Patient information dispensed concerning an anti-reflex regimen  Persistent vertigo/history of unsteady gait.  Neuro exam is still unremarkable.  Options discussed with patient and family will proceed with brain MRI as well as vestibular rehab.  Meclizine refilled  Diabetes mellitus.  Will check screening lab  [facility]

## 2017-11-24 NOTE — Patient Instructions (Addendum)
Avoids foods high in acid such as tomatoes citrus juices, and spicy foods.  Avoid eating within two hours of lying down or before exercising.  Do not overheat.  Try smaller more frequent meals.  If symptoms persist, elevate the head of her bed 12 inches while sleeping.   Gastroesophageal Reflux Disease, Adult Normally, food travels down the esophagus and stays in the stomach to be digested. If a person has gastroesophageal reflux disease (GERD), food and stomach acid move back up into the esophagus. When this happens, the esophagus becomes sore and swollen (inflamed). Over time, GERD can make small holes (ulcers) in the lining of the esophagus. Follow these instructions at home: Diet  Follow a diet as told by your doctor. You may need to avoid foods and drinks such as: ? Coffee and tea (with or without caffeine). ? Drinks that contain alcohol. ? Energy drinks and sports drinks. ? Carbonated drinks or sodas. ? Chocolate and cocoa. ? Peppermint and mint flavorings. ? Garlic and onions. ? Horseradish. ? Spicy and acidic foods, such as peppers, chili powder, curry powder, vinegar, hot sauces, and BBQ sauce. ? Citrus fruit juices and citrus fruits, such as oranges, lemons, and limes. ? Tomato-based foods, such as red sauce, chili, salsa, and pizza with red sauce. ? Fried and fatty foods, such as donuts, french fries, potato chips, and high-fat dressings. ? High-fat meats, such as hot dogs, rib eye steak, sausage, ham, and bacon. ? High-fat dairy items, such as whole milk, butter, and cream cheese.  Eat small meals often. Avoid eating large meals.  Avoid drinking large amounts of liquid with your meals.  Avoid eating meals during the 2-3 hours before bedtime.  Avoid lying down right after you eat.  Do not exercise right after you eat. General instructions  Pay attention to any changes in your symptoms.  Take over-the-counter and prescription medicines only as told by your doctor. Do  not take aspirin, ibuprofen, or other NSAIDs unless your doctor says it is okay.  Do not use any tobacco products, including cigarettes, chewing tobacco, and e-cigarettes. If you need help quitting, ask your doctor.  Wear loose clothes. Do not wear anything tight around your waist.  Raise (elevate) the head of your bed about 6 inches (15 cm).  Try to lower your stress. If you need help doing this, ask your doctor.  If you are overweight, lose an amount of weight that is healthy for you. Ask your doctor about a safe weight loss goal.  Keep all follow-up visits as told by your doctor. This is important. Contact a doctor if:  You have new symptoms.  You lose weight and you do not know why it is happening.  You have trouble swallowing, or it hurts to swallow.  You have wheezing or a cough that keeps happening.  Your symptoms do not get better with treatment.  You have a hoarse voice. Get help right away if:  You have pain in your arms, neck, jaw, teeth, or back.  You feel sweaty, dizzy, or light-headed.  You have chest pain or shortness of breath.  You throw up (vomit) and your throw up looks like blood or coffee grounds.  You pass out (faint).  Your poop (stool) is bloody or black.  You cannot swallow, drink, or eat. This information is not intended to replace advice given to you by your health care provider. Make sure you discuss any questions you have with your health care provider. Document Released: 04/29/2008  Document Revised: 04/18/2016 Document Reviewed: 03/08/2015 Elsevier Interactive Patient Education  [employer].  Vertigo Vertigo means that you feel like you are moving when you are not. Vertigo can also make you feel like things around you are moving when they are not. This feeling can come and go at any time. Vertigo often goes away on its own. Follow these instructions at home:  Avoid making fast movements.  Avoid driving.  Avoid using heavy  machinery.  Avoid doing any task or activity that might cause danger to you or other people if you would have a vertigo attack while you are doing it.  Sit down right away if you feel dizzy or have trouble with your balance.  Take over-the-counter and prescription medicines only as told by your doctor.  Follow instructions from your doctor about which positions or movements you should avoid.  Drink enough fluid to keep your pee (urine) clear or pale yellow.  Keep all follow-up visits as told by your doctor. This is important. Contact a doctor if:  Medicine does not help your vertigo.  You have a fever.  Your problems get worse or you have new symptoms.  Your family or friends see changes in your behavior.  You feel sick to your stomach (nauseous) or you throw up (vomit).  You have a "pins and needles" feeling or you are numb in part of your body. Get help right away if:  You have trouble moving or talking.  You are always dizzy.  You pass out (faint).  You get very bad headaches.  You feel weak or have trouble using your hands, arms, or legs.  You have changes in your hearing.  You have changes in your seeing (vision).  You get a stiff neck.  Bright light starts to bother you.   Smoking tobacco is very bad for your health. You should stop smoking immediately.  Brain MRI as discussed  Vestibular rehab

## 2017-11-25 LAB — BASIC METABOLIC PANEL
BUN: 16 mg/dL (ref 7–25)
CALCIUM: 9.7 mg/dL (ref 8.6–10.4)
CO2: 25 mmol/L (ref 20–32)
Chloride: 105 mmol/L (ref 98–110)
Creat: 0.88 mg/dL (ref 0.60–0.88)
GLUCOSE: 215 mg/dL — AB (ref 65–99)
Potassium: 3.8 mmol/L (ref 3.5–5.3)
Sodium: 142 mmol/L (ref 135–146)

## 2017-11-25 LAB — CBC WITH DIFFERENTIAL/PLATELET
BASOS PCT: 0.7 %
Basophils Absolute: 82 cells/uL (ref 0–200)
Eosinophils Absolute: 12 cells/uL — ABNORMAL LOW (ref 15–500)
Eosinophils Relative: 0.1 %
HCT: 44.6 % (ref 35.0–45.0)
Hemoglobin: 15.3 g/dL (ref 11.7–15.5)
Lymphs Abs: 1942 cells/uL (ref 850–3900)
MCH: 31.7 pg (ref 27.0–33.0)
MCHC: 34.3 g/dL (ref 32.0–36.0)
MCV: 92.5 fL (ref 80.0–100.0)
MONOS PCT: 3.3 %
MPV: 12 fL (ref 7.5–12.5)
Neutro Abs: 9278 cells/uL — ABNORMAL HIGH (ref 1500–7800)
Neutrophils Relative %: 79.3 %
PLATELETS: 246 10*3/uL (ref 140–400)
RBC: 4.82 10*6/uL (ref 3.80–5.10)
RDW: 12.2 % (ref 11.0–15.0)
TOTAL LYMPHOCYTE: 16.6 %
WBC mixed population: 386 cells/uL (ref 200–950)
WBC: 11.7 10*3/uL — AB (ref 3.8–10.8)

## 2017-11-25 LAB — TSH: TSH: 0.96 m[IU]/L (ref 0.40–4.50)

## 2017-11-26 ENCOUNTER — Other Ambulatory Visit: Payer: Self-pay | Admitting: Internal Medicine

## 2017-11-26 DIAGNOSIS — R42 Dizziness and giddiness: Secondary | ICD-10-CM

## 2017-11-26 DIAGNOSIS — H40033 Anatomical narrow angle, bilateral: Secondary | ICD-10-CM | POA: Diagnosis not present

## 2017-11-26 DIAGNOSIS — H81399 Other peripheral vertigo, unspecified ear: Secondary | ICD-10-CM

## 2017-11-26 DIAGNOSIS — H43393 Other vitreous opacities, bilateral: Secondary | ICD-10-CM | POA: Diagnosis not present

## 2018-01-06 ENCOUNTER — Ambulatory Visit: Payer: PPO | Admitting: Internal Medicine

## 2018-01-14 DIAGNOSIS — H26491 Other secondary cataract, right eye: Secondary | ICD-10-CM | POA: Diagnosis not present

## 2018-01-14 DIAGNOSIS — H16223 Keratoconjunctivitis sicca, not specified as Sjogren's, bilateral: Secondary | ICD-10-CM | POA: Diagnosis not present

## 2018-01-14 DIAGNOSIS — H26493 Other secondary cataract, bilateral: Secondary | ICD-10-CM | POA: Diagnosis not present

## 2018-01-14 DIAGNOSIS — E113299 Type 2 diabetes mellitus with mild nonproliferative diabetic retinopathy without macular edema, unspecified eye: Secondary | ICD-10-CM | POA: Diagnosis not present

## 2018-01-16 DIAGNOSIS — H43393 Other vitreous opacities, bilateral: Secondary | ICD-10-CM | POA: Diagnosis not present

## 2018-01-19 ENCOUNTER — Ambulatory Visit
Admission: RE | Admit: 2018-01-19 | Discharge: 2018-01-19 | Disposition: A | Discharge status: Home / Self Care | Payer: PPO | Source: Ambulatory Visit | Attending: Internal Medicine | Admitting: Internal Medicine

## 2018-01-19 DIAGNOSIS — R42 Dizziness and giddiness: Secondary | ICD-10-CM

## 2018-01-19 DIAGNOSIS — H81399 Other peripheral vertigo, unspecified ear: Secondary | ICD-10-CM

## 2018-01-29 ENCOUNTER — Other Ambulatory Visit: Payer: Self-pay | Admitting: Internal Medicine

## 2018-02-02 DIAGNOSIS — H26492 Other secondary cataract, left eye: Secondary | ICD-10-CM | POA: Diagnosis not present

## 2018-02-23 ENCOUNTER — Ambulatory Visit: Payer: PPO | Admitting: Internal Medicine

## 2018-02-23 DIAGNOSIS — Z0289 Encounter for other administrative examinations: Secondary | ICD-10-CM

## 2018-03-12 DIAGNOSIS — H04123 Dry eye syndrome of bilateral lacrimal glands: Secondary | ICD-10-CM | POA: Diagnosis not present

## 2018-03-24 ENCOUNTER — Ambulatory Visit (INDEPENDENT_AMBULATORY_CARE_PROVIDER_SITE_OTHER): Payer: PPO | Admitting: Internal Medicine

## 2018-03-24 ENCOUNTER — Observation Stay (HOSPITAL_COMMUNITY)
Admission: EM | Admit: 2018-03-24 | Discharge: 2018-03-26 | Disposition: A | Discharge status: Home / Self Care | Payer: PPO | Attending: Family Medicine | Admitting: Family Medicine

## 2018-03-24 ENCOUNTER — Encounter: Payer: Self-pay | Admitting: Internal Medicine

## 2018-03-24 ENCOUNTER — Emergency Department (HOSPITAL_COMMUNITY): Payer: PPO

## 2018-03-24 VITALS — BP 170/70

## 2018-03-24 DIAGNOSIS — Z7984 Long term (current) use of oral hypoglycemic drugs: Secondary | ICD-10-CM | POA: Insufficient documentation

## 2018-03-24 DIAGNOSIS — K833 Fistula of bile duct: Secondary | ICD-10-CM | POA: Insufficient documentation

## 2018-03-24 DIAGNOSIS — E119 Type 2 diabetes mellitus without complications: Secondary | ICD-10-CM

## 2018-03-24 DIAGNOSIS — R945 Abnormal results of liver function studies: Secondary | ICD-10-CM | POA: Diagnosis not present

## 2018-03-24 DIAGNOSIS — F17210 Nicotine dependence, cigarettes, uncomplicated: Secondary | ICD-10-CM | POA: Diagnosis not present

## 2018-03-24 DIAGNOSIS — I160 Hypertensive urgency: Secondary | ICD-10-CM | POA: Diagnosis present

## 2018-03-24 DIAGNOSIS — Z7982 Long term (current) use of aspirin: Secondary | ICD-10-CM | POA: Diagnosis not present

## 2018-03-24 DIAGNOSIS — E785 Hyperlipidemia, unspecified: Secondary | ICD-10-CM | POA: Diagnosis present

## 2018-03-24 DIAGNOSIS — R7989 Other specified abnormal findings of blood chemistry: Secondary | ICD-10-CM

## 2018-03-24 DIAGNOSIS — K851 Biliary acute pancreatitis without necrosis or infection: Secondary | ICD-10-CM | POA: Diagnosis not present

## 2018-03-24 DIAGNOSIS — Z720 Tobacco use: Secondary | ICD-10-CM

## 2018-03-24 DIAGNOSIS — I10 Essential (primary) hypertension: Secondary | ICD-10-CM

## 2018-03-24 DIAGNOSIS — I25110 Atherosclerotic heart disease of native coronary artery with unstable angina pectoris: Secondary | ICD-10-CM

## 2018-03-24 DIAGNOSIS — Z79899 Other long term (current) drug therapy: Secondary | ICD-10-CM | POA: Insufficient documentation

## 2018-03-24 DIAGNOSIS — R072 Precordial pain: Secondary | ICD-10-CM | POA: Diagnosis not present

## 2018-03-24 DIAGNOSIS — R079 Chest pain, unspecified: Secondary | ICD-10-CM

## 2018-03-24 DIAGNOSIS — K838 Other specified diseases of biliary tract: Secondary | ICD-10-CM

## 2018-03-24 DIAGNOSIS — R0602 Shortness of breath: Secondary | ICD-10-CM | POA: Diagnosis not present

## 2018-03-24 LAB — TROPONIN I: Troponin I: <0.03 ng/mL (ref <0.03)

## 2018-03-24 LAB — CBC WITH DIFFERENTIAL/PLATELET
Basophils Absolute: 0 10*3/uL (ref 0.0–0.1)
Basophils Relative: 0 %
EOS ABS: 0 10*3/uL (ref 0.0–0.7)
EOS PCT: 0 %
HCT: 47.1 % — ABNORMAL HIGH (ref 36.0–46.0)
Hemoglobin: 15.7 g/dL — ABNORMAL HIGH (ref 12.0–15.0)
LYMPHS ABS: 0.8 10*3/uL (ref 0.7–4.0)
LYMPHS PCT: 12 %
MCH: 32.1 pg (ref 26.0–34.0)
MCHC: 33.3 g/dL (ref 30.0–36.0)
MCV: 96.3 fL (ref 78.0–100.0)
MONO ABS: 0.5 10*3/uL (ref 0.1–1.0)
Monocytes Relative: 7 %
Neutro Abs: 5.8 10*3/uL (ref 1.7–7.7)
Neutrophils Relative %: 81 %
PLATELETS: 202 10*3/uL (ref 150–400)
RBC: 4.89 MIL/uL (ref 3.87–5.11)
RDW: 13.6 % (ref 11.5–15.5)
WBC: 7.2 10*3/uL (ref 4.0–10.5)

## 2018-03-24 LAB — COMPREHENSIVE METABOLIC PANEL
ALT: 418 U/L — AB (ref 14–54)
AST: 885 U/L — ABNORMAL HIGH (ref 15–41)
Albumin: 3.9 g/dL (ref 3.5–5.0)
Alkaline Phosphatase: 213 U/L — ABNORMAL HIGH (ref 38–126)
Anion gap: 12 (ref 5–15)
BUN: 11 mg/dL (ref 6–20)
CHLORIDE: 105 mmol/L (ref 101–111)
CO2: 23 mmol/L (ref 22–32)
Calcium: 9.4 mg/dL (ref 8.9–10.3)
Creatinine, Ser: 0.93 mg/dL (ref 0.44–1.00)
GFR calc Af Amer: >60 mL/min (ref >60)
GFR calc non Af Amer: 56 mL/min — ABNORMAL LOW (ref >60)
GLUCOSE: 127 mg/dL — AB (ref 65–99)
Potassium: 3.5 mmol/L (ref 3.5–5.1)
Sodium: 140 mmol/L (ref 135–145)
Total Bilirubin: 0.7 mg/dL (ref 0.3–1.2)
Total Protein: 7 g/dL (ref 6.5–8.1)

## 2018-03-24 LAB — APTT: aPTT: 33 seconds (ref 24–36)

## 2018-03-24 LAB — LIPID PANEL
CHOL/HDL RATIO: 2.7 ratio
Cholesterol: 173 mg/dL (ref 0–200)
HDL: 63 mg/dL (ref >40)
LDL CALC: 82 mg/dL (ref 0–99)
TRIGLYCERIDES: 142 mg/dL (ref <150)
VLDL: 28 mg/dL (ref 0–40)

## 2018-03-24 LAB — PROTIME-INR
INR: 0.9
PROTHROMBIN TIME: 12.1 s (ref 11.4–15.2)

## 2018-03-24 LAB — POCT GLYCOSYLATED HEMOGLOBIN (HGB A1C): Hemoglobin A1C: 7

## 2018-03-24 MED ORDER — ASPIRIN 81 MG PO CHEW
324.0000 mg | CHEWABLE_TABLET | Freq: Once | ORAL | Status: AC
  Administered 2018-03-24: 324 mg via ORAL
  Filled 2018-03-24: qty 4

## 2018-03-24 MED ORDER — NITROGLYCERIN 0.4 MG SL SUBL
0.4000 mg | SUBLINGUAL_TABLET | Freq: Once | SUBLINGUAL | Status: AC
  Administered 2018-03-24: 0.4 mg via SUBLINGUAL

## 2018-03-24 MED ORDER — SODIUM CHLORIDE 0.9 % IV SOLN
INTRAVENOUS | Status: DC
  Administered 2018-03-24: 18:00:00 via INTRAVENOUS

## 2018-03-24 MED ORDER — LABETALOL HCL 5 MG/ML IV SOLN
5.0000 mg | Freq: Once | INTRAVENOUS | Status: AC
  Administered 2018-03-24: 5 mg via INTRAVENOUS
  Filled 2018-03-24: qty 4

## 2018-03-24 MED ORDER — LISINOPRIL 20 MG PO TABS
20.0000 mg | ORAL_TABLET | Freq: Once | ORAL | Status: AC
  Administered 2018-03-24: 20 mg via ORAL
  Filled 2018-03-24: qty 1

## 2018-03-24 NOTE — ED Notes (Signed)
[facility] team at bedside  

## 2018-03-24 NOTE — Patient Instructions (Signed)
The patient will be transported via EMS to [facility] ED for further management

## 2018-03-24 NOTE — ED Provider Notes (Signed)
[facility] EMERGENCY DEPARTMENT Provider Note   CSN: [id number] Arrival date & time: 03/24/18  1722     History   Chief Complaint Chief Complaint  Patient presents with  . Chest Pain    HPI [name] is a 84 y.o. female.   Chest Pain   This is a new problem. The current episode started 6 to 12 hours ago. The problem occurs hourly. The problem has been resolved. The pain is associated with exertion. The pain is present in the substernal region. The pain is mild. The quality of the pain is described as brief and heavy. The pain does not radiate. Associated symptoms include nausea and shortness of breath.    Past Medical History:  Diagnosis Date  . Diabetes mellitus, type 2 ([place])   . History of cervical cancer   . Hyperlipidemia   . Hypertension   . Raynaud's phenomenon   . TN (trigeminal neuralgia)     Patient Active Problem List   Diagnosis Date Noted  . Vertigo 11/26/2017  . Atypical chest pain 10/06/2013  . Carotid artery disease ([place]) 12/13/2012  . Tobacco abuse 02/20/2012  . WARTS, HAND 07/16/2010  . RAYNAUD'S SYNDROME 11/30/2009  . TRIGEMINAL NEURALGIA 04/14/2008  . Diabetes mellitus without complication ([place]) 83/07/4075  . Dyslipidemia 07/23/2007  . Essential hypertension 07/23/2007  . CERVICAL CANCER, HX OF 07/23/2007    Past Surgical History:  Procedure Laterality Date  . BREAST BIOPSY    . BREAST ENHANCEMENT SURGERY    . CHOLECYSTECTOMY N/A 02/14/2014   Procedure: LAPAROSCOPIC CHOLECYSTECTOMY;  Surgeon: Ralene Ok, MD;  Location: [facility];  Service: General;  Laterality: N/A;  . ERCP N/A 03/08/2015   Procedure: ENDOSCOPIC RETROGRADE CHOLANGIOPANCREATOGRAPHY (ERCP);  Surgeon: Ladene Artist, MD;  Location: [facility];  Service: Endoscopy;  Laterality: N/A;  . MASS EXCISION Right 12/25/2016   Procedure: EXCISION RIGHT SCALP MASS SEBACEOUS CYST;  Surgeon: Coralie Keens, MD;  Location: [facility];  Service: General;   Laterality: Right;  . TOTAL ABDOMINAL HYSTERECTOMY  1963   for cervical cancer in situ     OB History   None      Home Medications    Prior to Admission medications   Medication Sig Start Date End Date Taking? Authorizing Provider  aspirin EC 81 MG tablet Take 81 mg by mouth at bedtime.   Yes [provider]  atorvastatin (LIPITOR) 20 MG tablet TAKE ONE TABLET BY MOUTH IN THE MORNING 04/14/17  Yes Marletta Lor, MD  carboxymethylcellulose (REFRESH PLUS) 0.5 % SOLN Place 1 drop into both eyes 2 (two) times daily.   Yes [provider]  lisinopril (PRINIVIL,ZESTRIL) 20 MG tablet TAKE ONE TABLET BY MOUTH TWICE DAILY 09/10/17  Yes Marletta Lor, MD  metFORMIN (GLUCOPHAGE) 500 MG tablet TAKE 1 TABLET BY MOUTH TWICE DAILY WITH FOOD Patient taking differently: TAKE 1 TABLET BY MOUTH DAILY WITH FOOD 01/29/18  Yes Marletta Lor, MD  Multiple Vitamins-Minerals (WOMENS 50+ [place] VITAMIN/MIN PO) Take 1 tablet by mouth daily with lunch.    Yes [provider]  omeprazole (PRILOSEC) 20 MG capsule Take 1 capsule (20 mg total) by mouth daily. 11/24/17  Yes Marletta Lor, MD  TURMERIC CURCUMIN PO Take 1 capsule by mouth daily with lunch.    Yes [provider]  Blood Glucose Monitoring Suppl (ONE TOUCH ULTRA MINI) w/Device KIT USE TO CHECK BLOOD SUGAR DAILY AND AS NEEDED 10/01/16   Marletta Lor, MD  Lancets (  ONETOUCH ULTRASOFT) lancets Once daily or PRN, Dx E11.9 04/14/15   Marletta Lor, MD  meclizine (ANTIVERT) 25 MG tablet TAKE 1 TABLET BY MOUTH THREE TIMES DAILY AS NEEDED FOR DIZZINESS Patient not taking: Reported on 03/24/2018 11/24/17   Marletta Lor, MD  ONE [facility] ULTRA TEST test strip USE  STRIP TO CHECK GLUCOSE ONCE DAILY AS DIRECTED 10/01/16   Marletta Lor, MD  [facility] DELICA LANCETS 74Q [place] USE TO CHECK BLOOD SUGAR ONCE DAILY 08/05/16   Marletta Lor, MD    Family History No family history on  file.  Social History Social History   Tobacco Use  . Smoking status: Current Every Day Smoker    Packs/day: 1.50    Years: 60.00    Pack years: 90.00    Types: Cigarettes  . Smokeless tobacco: Never Used  Substance Use Topics  . Alcohol use: No  . Drug use: No     Allergies   Morphine and related   Review of Systems Review of Systems  Respiratory: Positive for shortness of breath.   Cardiovascular: Positive for chest pain.  Gastrointestinal: Positive for nausea.  All other systems reviewed and are negative.    Physical Exam Updated Vital Signs BP (!) 168/67   Pulse 71   Temp 98.8 F (37.1 C) (Oral)   Resp (!) 23   SpO2 93%   Physical Exam  Constitutional: She appears well-developed and well-nourished.  HENT:  Head: Normocephalic and atraumatic.  Eyes: Pupils are equal, round, and reactive to light. EOM are normal.  Neck: Normal range of motion.  Cardiovascular: Normal rate, regular rhythm and normal pulses.  Pulmonary/Chest: Effort normal and breath sounds normal. No stridor. No respiratory distress.  Abdominal: Soft. She exhibits no distension.  Musculoskeletal: Normal range of motion.       Right lower leg: Normal.       Left lower leg: Normal.  Neurological: She is alert.  Skin: Skin is warm and dry.  Nursing note and vitals reviewed.    ED Treatments / Results  Labs (all labs ordered are listed, but only abnormal results are displayed) Labs Reviewed  CBC WITH DIFFERENTIAL/PLATELET - Abnormal; Notable for the following components:      Result Value   Hemoglobin 15.7 (*)    HCT 47.1 (*)    All other components within normal limits  COMPREHENSIVE METABOLIC PANEL - Abnormal; Notable for the following components:   Glucose, Bld 127 (*)    AST 885 (*)    ALT 418 (*)    Alkaline Phosphatase 213 (*)    GFR calc non Af Amer 56 (*)    All other components within normal limits  PROTIME-INR  APTT  TROPONIN I  LIPID PANEL  LIPASE, BLOOD     EKG EKG Interpretation  Date/Time:  Tuesday March 24 2018 17:37:00 EDT Ventricular Rate:  79 PR Interval:    QRS Duration: 87 QT Interval:  392 QTC Calculation: 450 R Axis:   -39 Text Interpretation:  Sinus rhythm Left axis deviation Minimal ST depression, lateral leads st depression new from  january 2018 Confirmed by Merrily Pew [phone]) on 03/24/2018 5:52:15 PM   Radiology Dg Chest 2 View  Result Date: 03/24/2018 CLINICAL DATA:  84 y/o F; central chest pain, shortness of breath, and nausea for 1 day. EXAM: CHEST - 2 VIEW COMPARISON:  08/29/2017 chest radiograph FINDINGS: Stable normal cardiac silhouette given projection and technique. Aortic atherosclerosis with calcification. Clear lungs. No pleural effusion  or pneumothorax. No acute osseous abnormality is evident. S-shaped curvature of the spine. IMPRESSION: No acute pulmonary process identified.  Aortic atherosclerosis. Electronically Signed   By: Kristine Garbe M.D.   On: 03/24/2018 18:21    Procedures Procedures (including critical care time)  Medications Ordered in ED Medications  0.9 %  sodium chloride infusion ( Intravenous Stopped 03/24/18 2032)  aspirin chewable tablet 324 mg (324 mg Oral Given 03/24/18 1808)  lisinopril (PRINIVIL,ZESTRIL) tablet 20 mg (20 mg Oral Given 03/24/18 2146)  labetalol (NORMODYNE,TRANDATE) injection 5 mg (5 mg Intravenous Given 03/24/18 2254)     Initial Impression / Assessment and Plan / ED Course  I have reviewed the triage vital signs and the nursing notes.  Pertinent labs & imaging results that were available during my care of the patient were reviewed by me and considered in my medical decision making (see chart for details).     Concern for possible insulin angina versus hypertensive emergency as her blood pressure was quite high.  However her symptoms have been more exertional in nature so will admit for ACS work-up.  Final Clinical Impressions(s) / ED Diagnoses    Final diagnoses:  Chest pain, unspecified type  Hypertension, unspecified type    ED Discharge Orders    None       Kenedi Cilia, Corene Cornea, MD 03/24/18 2333

## 2018-03-24 NOTE — ED Triage Notes (Signed)
Pt arrived via [facility] ems from MD office where pt experienced CP. Pt stated her pain began this morning at 0500, describing it as a "squeezing pain" in her central chest. Pt denies CP, SOB, and N/V at time of triage. Pt is alert and oriented x4.

## 2018-03-24 NOTE — ED Notes (Signed)
Patient transported to X-ray 

## 2018-03-24 NOTE — ED Notes (Signed)
Pt is hypertensive, EDP notified.

## 2018-03-24 NOTE — Progress Notes (Signed)
Subjective:    Patient ID: [name], female    DOB: March 05, 1934, 84 y.o.   MRN: [id number]  HPI  84 year old patient who presents today for her quarterly follow-up. Cardiovascular risk factors include type 2 diabetes ongoing tobacco use hypertension and a history of carotid artery stenosis.    She states that she was awakened at approximately a.m. with significant substernal chest pain that lasted about 2-1/2 hours.  This was associated with shortness of breath and considerable fatigue.  Prior to her office visit here she went to [employer], and had some increased pain with walking.  1 hour ago she has had some recurrent milder substernal chest pain described as a squeezing type sensation.  Complaints include mild sore throat and fatigue.  Denies any cough or shortness of breath  She does take daily aspirin therapy at bedtime and is on statin therapy.  She states that for the past 2 to 3 years she has had some occasional similar chest pain she states that this has occurred on 2 occasions earlier this year. She has never had chest pain of today's severity or duration.  Past Medical History:  Diagnosis Date  . Diabetes mellitus, type 2 ([place])   . History of cervical cancer   . Hyperlipidemia   . Hypertension   . Raynaud's phenomenon   . TN (trigeminal neuralgia)      Social History   Socioeconomic History  . Marital status: Married    Spouse name: [name]  . Number of children: 1  . Years of education: Not on file  . Highest education level: Not on file  Occupational History  . 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 Financial resource strain: Not on file  . Food insecurity:    Worry: Not on file    Inability: Not on file  . Transportation needs:    Medical: Not on file    Non-medical: Not on file  Tobacco Use  . Smoking status: Current Every Day Smoker    Packs/day: 1.50    Years: 60.00    Pack years: 90.00    Types: Cigarettes    . Smokeless tobacco: Never Used  Substance and Sexual Activity  . Alcohol use: No  . Drug use: No  . Sexual activity: Not on file  Lifestyle  . Physical activity:    Days per week: Not on file    Minutes per session: Not on file  . Stress: Not on file  Relationships  . Social connections:    Talks on phone: Not on file    Gets together: Not on file    Attends religious service: Not on file    Active member of club or organization: Not on file    Attends meetings of clubs or organizations: Not on file    Relationship status: Not on file  . Intimate partner violence:    Fear of current or ex partner: Not on file    Emotionally abused: Not on file    Physically abused: Not on file    Forced sexual activity: Not on file  Other Topics Concern  . Not on file  Social History Narrative   Married.  Lives in [place] with her husband.  No FH of heart disease.    Past Surgical History:  Procedure Laterality Date  . BREAST BIOPSY    . BREAST ENHANCEMENT SURGERY    . CHOLECYSTECTOMY N/A 02/14/2014   Procedure: LAPAROSCOPIC CHOLECYSTECTOMY;  Surgeon:  Ralene Ok, MD;  Location: [facility];  Service: General;  Laterality: N/A;  . ERCP N/A 03/08/2015   Procedure: ENDOSCOPIC RETROGRADE CHOLANGIOPANCREATOGRAPHY (ERCP);  Surgeon: Ladene Artist, MD;  Location: [facility];  Service: Endoscopy;  Laterality: N/A;  . MASS EXCISION Right 12/25/2016   Procedure: EXCISION RIGHT SCALP MASS SEBACEOUS CYST;  Surgeon: Coralie Keens, MD;  Location: [facility];  Service: General;  Laterality: Right;  . TOTAL ABDOMINAL HYSTERECTOMY  1963   for cervical cancer in situ    No family history on file.  Allergies  Allergen Reactions  . Morphine And Related Itching    Short lived, mild itching.    Current Outpatient Medications on File Prior to Visit  Medication Sig Dispense Refill  . aspirin EC 81 MG tablet Take 81 mg by mouth at bedtime.    [name] atorvastatin (LIPITOR) 20 MG tablet TAKE ONE TABLET BY  MOUTH IN THE MORNING 90 tablet 3  . Blood Glucose Monitoring Suppl (ONE TOUCH ULTRA MINI) w/Device KIT USE TO CHECK BLOOD SUGAR DAILY AND AS NEEDED 1 each 0  . carboxymethylcellulose (REFRESH PLUS) 0.5 % SOLN Place 1 drop into both eyes 2 (two) times daily.    . Lancets (ONETOUCH ULTRASOFT) lancets Once daily or PRN, Dx E11.9 100 each 12  . lisinopril (PRINIVIL,ZESTRIL) 20 MG tablet TAKE ONE TABLET BY MOUTH TWICE DAILY 180 tablet 1  . meclizine (ANTIVERT) 25 MG tablet TAKE 1 TABLET BY MOUTH THREE TIMES DAILY AS NEEDED FOR DIZZINESS 60 tablet 4  . metFORMIN (GLUCOPHAGE) 500 MG tablet TAKE 1 TABLET BY MOUTH TWICE DAILY WITH FOOD 180 tablet 1  . Multiple Vitamins-Minerals (WOMENS 50+ MULTI VITAMIN/MIN PO) Take 1 tablet by mouth daily with lunch.     [name] omeprazole (PRILOSEC) 20 MG capsule Take 1 capsule (20 mg total) by mouth daily. 90 capsule 3  . ONE TOUCH ULTRA TEST test strip USE  STRIP TO CHECK GLUCOSE ONCE DAILY AS DIRECTED 100 each 1  . ONETOUCH DELICA LANCETS 86P MISC USE TO CHECK BLOOD SUGAR ONCE DAILY 100 each 4  . TURMERIC CURCUMIN PO Take 1 capsule by mouth daily with lunch.      No current facility-administered medications on file prior to visit.     BP (!) 170/70       Review of Systems  Constitutional: Positive for activity change, appetite change, fatigue and fever.  HENT: Positive for sore throat. Negative for congestion, dental problem, hearing loss, rhinorrhea, sinus pressure and tinnitus.   Eyes: Negative for pain, discharge and visual disturbance.  Respiratory: Positive for shortness of breath. Negative for cough.   Cardiovascular: Positive for chest pain. Negative for palpitations and leg swelling.  Gastrointestinal: Negative for abdominal distention, abdominal pain, blood in stool, constipation, diarrhea, nausea and vomiting.  Genitourinary: Negative for difficulty urinating, dysuria, flank pain, frequency, hematuria, pelvic pain, urgency, vaginal bleeding, vaginal  discharge and vaginal pain.  Musculoskeletal: Negative for arthralgias, gait problem and joint swelling.  Skin: Negative for rash.  Neurological: Negative for dizziness, syncope, speech difficulty, weakness, numbness and headaches.  Hematological: Negative for adenopathy.  Psychiatric/Behavioral: Negative for agitation, behavioral problems and dysphoric mood. The patient is not nervous/anxious.        Objective:   Physical Exam  Constitutional: She is oriented to person, place, and time. She appears well-developed and well-nourished.  Appears weak but in no acute distress Temperature 99.2 Blood pressure 170/70  Hemoglobin A1c 7.0  HENT:  Head: Normocephalic.  Right Ear: External ear  normal.  Left Ear: External ear normal.  Mouth/Throat: Oropharynx is clear and moist.  Eyes: Pupils are equal, round, and reactive to light. Conjunctivae and EOM are normal.  Neck: Normal range of motion. Neck supple. No thyromegaly present.  Cardiovascular: Normal rate, regular rhythm, normal heart sounds and intact distal pulses.  Dorsalis pedis pulses are intact posterior tibial pulses are not easily palpable  Feet are warm and well-perfused  Pulmonary/Chest: Effort normal and breath sounds normal.  Few crackles right base Rare scattered rhonchi  Abdominal: Soft. Bowel sounds are normal. She exhibits no mass. There is no tenderness.  Musculoskeletal: Normal range of motion.  Lymphadenopathy:    She has no cervical adenopathy.  Neurological: She is alert and oriented to person, place, and time.  Skin: Skin is warm and dry. No rash noted.  Psychiatric: She has a normal mood and affect. Her behavior is normal.          Assessment & Plan:   Acute coronary syndrome in a patient with multiple cardiac risk factors.  Patient received nitroglycerin with alleviation of substernal chest pain.  Will review EKG We will transport to ED via EMS  Diabetes mellitus Hypertension Ongoing tobacco  use  Fatigue/sore throat/fever.   EKG reviewed.  This revealed a normal sinus rhythm.  Left axis deviation with minor nonspecific ST-T wave changes.  A single sublingual nitroglycerin relieved patient's chest pain.  Patient presently pain-free.   Nyoka Cowden

## 2018-03-25 ENCOUNTER — Observation Stay (HOSPITAL_BASED_OUTPATIENT_CLINIC_OR_DEPARTMENT_OTHER): Payer: PPO

## 2018-03-25 ENCOUNTER — Observation Stay (HOSPITAL_COMMUNITY): Payer: PPO

## 2018-03-25 ENCOUNTER — Encounter (HOSPITAL_COMMUNITY): Payer: Self-pay | Admitting: Internal Medicine

## 2018-03-25 DIAGNOSIS — R079 Chest pain, unspecified: Secondary | ICD-10-CM | POA: Diagnosis present

## 2018-03-25 DIAGNOSIS — K838 Other specified diseases of biliary tract: Secondary | ICD-10-CM | POA: Diagnosis not present

## 2018-03-25 DIAGNOSIS — I160 Hypertensive urgency: Secondary | ICD-10-CM | POA: Diagnosis not present

## 2018-03-25 DIAGNOSIS — E119 Type 2 diabetes mellitus without complications: Secondary | ICD-10-CM

## 2018-03-25 DIAGNOSIS — R945 Abnormal results of liver function studies: Secondary | ICD-10-CM | POA: Diagnosis not present

## 2018-03-25 DIAGNOSIS — I10 Essential (primary) hypertension: Secondary | ICD-10-CM | POA: Diagnosis not present

## 2018-03-25 DIAGNOSIS — E785 Hyperlipidemia, unspecified: Secondary | ICD-10-CM | POA: Diagnosis not present

## 2018-03-25 DIAGNOSIS — R932 Abnormal findings on diagnostic imaging of liver and biliary tract: Secondary | ICD-10-CM | POA: Diagnosis not present

## 2018-03-25 LAB — ECHOCARDIOGRAM COMPLETE
Height: 57 in
Weight: 1601.6 oz

## 2018-03-25 LAB — HEPATIC FUNCTION PANEL
ALK PHOS: 157 U/L — AB (ref 38–126)
ALT: 222 U/L — ABNORMAL HIGH (ref 14–54)
AST: 174 U/L — ABNORMAL HIGH (ref 15–41)
Albumin: 3.1 g/dL — ABNORMAL LOW (ref 3.5–5.0)
BILIRUBIN DIRECT: 0.2 mg/dL (ref 0.1–0.5)
BILIRUBIN INDIRECT: 0.4 mg/dL (ref 0.3–0.9)
BILIRUBIN TOTAL: 0.6 mg/dL (ref 0.3–1.2)
Total Protein: 5.8 g/dL — ABNORMAL LOW (ref 6.5–8.1)

## 2018-03-25 LAB — CBC
HEMATOCRIT: 42.7 % (ref 36.0–46.0)
Hemoglobin: 14.5 g/dL (ref 12.0–15.0)
MCH: 32.5 pg (ref 26.0–34.0)
MCHC: 34 g/dL (ref 30.0–36.0)
MCV: 95.7 fL (ref 78.0–100.0)
Platelets: 187 10*3/uL (ref 150–400)
RBC: 4.46 MIL/uL (ref 3.87–5.11)
RDW: 13.9 % (ref 11.5–15.5)
WBC: 6.3 10*3/uL (ref 4.0–10.5)

## 2018-03-25 LAB — TROPONIN I

## 2018-03-25 LAB — GLUCOSE, CAPILLARY
Glucose-Capillary: 192 mg/dL — ABNORMAL HIGH (ref 65–99)
Glucose-Capillary: 105 mg/dL — ABNORMAL HIGH (ref 65–99)

## 2018-03-25 LAB — CREATININE, SERUM
Creatinine, Ser: 0.84 mg/dL (ref 0.44–1.00)
GFR calc non Af Amer: >60 mL/min (ref >60)

## 2018-03-25 LAB — CBG MONITORING, ED
GLUCOSE-CAPILLARY: 98 mg/dL (ref 65–99)
Glucose-Capillary: 107 mg/dL — ABNORMAL HIGH (ref 65–99)
Glucose-Capillary: 81 mg/dL (ref 65–99)

## 2018-03-25 LAB — LIPASE, BLOOD
Lipase: 169 U/L — ABNORMAL HIGH (ref 11–51)
Lipase: 66 U/L — ABNORMAL HIGH (ref 11–51)

## 2018-03-25 MED ORDER — ACETAMINOPHEN 325 MG PO TABS: 650.0000 mg | ORAL_TABLET | ORAL | Status: DC | PRN

## 2018-03-25 MED ORDER — LISINOPRIL 20 MG PO TABS
20.0000 mg | ORAL_TABLET | Freq: Two times a day (BID) | ORAL | Status: DC
  Administered 2018-03-25: 20 mg via ORAL
  Filled 2018-03-25 (×2): qty 1

## 2018-03-25 MED ORDER — ASPIRIN EC 325 MG PO TBEC
325.0000 mg | DELAYED_RELEASE_TABLET | Freq: Every day | ORAL | Status: DC
  Administered 2018-03-25 – 2018-03-26 (×2): 325 mg via ORAL
  Filled 2018-03-25 (×2): qty 1

## 2018-03-25 MED ORDER — NITROGLYCERIN 0.4 MG SL SUBL: 0.4000 mg | SUBLINGUAL_TABLET | SUBLINGUAL | Status: DC | PRN

## 2018-03-25 MED ORDER — ENOXAPARIN SODIUM 40 MG/0.4ML ~~LOC~~ SOLN
40.0000 mg | SUBCUTANEOUS | Status: DC
  Administered 2018-03-25 – 2018-03-26 (×2): 40 mg via SUBCUTANEOUS
  Filled 2018-03-25 (×2): qty 0.4

## 2018-03-25 MED ORDER — METOPROLOL TARTRATE 25 MG PO TABS
25.0000 mg | ORAL_TABLET | Freq: Two times a day (BID) | ORAL | Status: DC
  Administered 2018-03-25 – 2018-03-26 (×3): 25 mg via ORAL
  Filled 2018-03-25 (×3): qty 1

## 2018-03-25 MED ORDER — HYDRALAZINE HCL 20 MG/ML IJ SOLN
10.0000 mg | INTRAMUSCULAR | Status: DC | PRN
  Administered 2018-03-25 – 2018-03-26 (×2): 10 mg via INTRAVENOUS
  Filled 2018-03-25 (×2): qty 1

## 2018-03-25 MED ORDER — INSULIN ASPART 100 UNIT/ML ~~LOC~~ SOLN
0.0000 [IU] | Freq: Three times a day (TID) | SUBCUTANEOUS | Status: DC
  Administered 2018-03-25: 2 [IU] via SUBCUTANEOUS
  Administered 2018-03-26: 5 [IU] via SUBCUTANEOUS

## 2018-03-25 MED ORDER — ATORVASTATIN CALCIUM 20 MG PO TABS: 20.0000 mg | ORAL_TABLET | Freq: Every morning | ORAL | Status: DC

## 2018-03-25 MED ORDER — ONDANSETRON HCL 4 MG/2ML IJ SOLN: 4.0000 mg | Freq: Four times a day (QID) | INTRAMUSCULAR | Status: DC | PRN

## 2018-03-25 MED ORDER — PANTOPRAZOLE SODIUM 40 MG PO TBEC
40.0000 mg | DELAYED_RELEASE_TABLET | Freq: Every day | ORAL | Status: DC
Start: 2018-03-25 — End: 2018-03-26
  Administered 2018-03-25 – 2018-03-26 (×2): 40 mg via ORAL
  Filled 2018-03-25 (×2): qty 1

## 2018-03-25 MED ORDER — GADOBENATE DIMEGLUMINE 529 MG/ML IV SOLN
10.0000 mL | Freq: Once | INTRAVENOUS | Status: AC
  Administered 2018-03-25: 9 mL via INTRAVENOUS

## 2018-03-25 NOTE — Progress Notes (Signed)
  Echocardiogram 2D Echocardiogram has been performed.  Nakiya Rallis T Jakki Doughty 03/25/2018, 5:11 PM

## 2018-03-25 NOTE — ED Notes (Signed)
MD K at bedside 

## 2018-03-25 NOTE — Consult Note (Addendum)
[facility] Consult: 5:27 PM 03/25/2018  LOS: 0 days    Referring Provider: Dr Bonner Puna  Primary Care Physician:  Marletta Lor, MD Primary Gastroenterologist:  Dr. Fuller Plan     Reason for Consultation:  abd pain, elevated LFTs, dilated bile ducts.     IMPRESSION:   *    2 to 3 hours of chest pain.  Abnormal LFTs and dilated CBD and lesser intrahepatic biliary duct dilatation on ultrasound. Previous choledocholithiasis in 2016.  Required referral for ERCP/EUS to [facility] after MD in [place] unable to cannulate CBD or pancreatic duct. EKG with normal sinus rhythm and minimal ST depression laterally.  Troponin I negative x 2.  Chest pain not exertional.    PLAN:     *   Dr. Carlean Purl reviewed the case and spoke with pt.  At this point plan is for MRCP today after which Dr Darnell Level will decide whether or not the patient needs ERCP.  If he does go forward with ERCP, it would be done tomorrow.  The risks and benefits were discussed with the patient.   *   Request records of the 2016 EUS/ERCP  from [facility].    Azucena Freed PA-C Phone [place] Attending   I have taken an interval history, reviewed the chart and examined the patient. I agree with the Advanced Practitioner's note, impression and recommendations.   Her MRCP is as above - 17 mm CBD 5 mmm PD, no stones or other pathology. In 2016 at [facility] she never had ERCP as EUS showed 8 mm CBD and no other findings. recrds reviewed  While bile duct stones are possible seems to me that she could have papillary stenosis problem(s) causing episodes of pain and pancreatitis-(mild).  I think considering having another ERCP is reasonable - she prefers not and if she were to have one might be best to go to a tertiary center  where success is more likely since she has had a failure with skilled operator before (Dr. Fuller Plan).   I suspect her sxs of chest pain are from the bile duct obstruction/dilation  Will see her tomorrow and arrange outpatient f/u w/ Dr. Fuller Plan or APP - unless clinical course dictates intervention while here (she seems better already)  Gatha Mayer, MD, [facility] 03/25/2018 5:32 PM  HPI: [name] is a 84 y.o. female.  Past history CAD.  Carotid artery stenosis.  Hypertension.  Hyperlipidemia.  Raynaud's disease.  Type 2 DM.   2008 Colonoscopy.  Routine screening study by Dr. Deatra Ina.  Diverticulosis noted. S/p Lap chole 01/2014.   S/p ERCP 4/132016 for biliary dilatation and pancreatitis seen on ultrasound and elevated LFTs.  Dr. Fuller Plan.  Prominent but normal-appearing ampulla.  He was unable to cannulate the CBD or PD.  03/09/2015 MRCP.  Mild intrahepatic and extrahepatic ductal dilatation, mild stricturing of the distal CBD.  No filling defects or external compression.  Mild prominence of pancreatic duct.  No evidence of acute pancreatitis or pancreatic lesions. She  was referred to [facility] and underwent ERCP/EUS 04/11/2015.  Do not have copy of the procedure report.  Patient was awakened with pain across her chest at 6 AM yesterday.  There was nausea and retching but no emesis.  The pain did not radiate.  It was not associated with dyspnea but the pain would get worse if she took a deeper breath.  It lasted about 2-1/2 hours.  In some ways it is reminiscent of symptoms she had in 2016 when she had choledocholithiasis.  Dr. Raliegh Ip referred her to the ED for evaluation. She had similar,also limited, pain a few months ago.  Dr. Raliegh Ip attributed this to GERD and suggested continuing omeprazole and provided information regarding lifestyle changes. Patient says her appetite has been poor for several months, she is just not hungry.  She does not have nausea or heartburn when  she eats.  She thinks she is lost maybe 5 pounds.  Patient does not drink alcohol.  Pain did not recur later yesterday or today.  She is pain-free currently. Lipase 169.  T bili 0.7.  Alkaline phosphatase 213.  AST/ALT 885/418.  WBCs normal. Troponin I negative x 2.   Abdominal ultrasound reveals 10m - 11 mm CBD.  Mild intrahepatic biliary ductal dilatation.  No CBD stones or filling defect.     Past Medical History:  Diagnosis Date  . Diabetes mellitus, type 2 [place]   . History of cervical cancer   . Hyperlipidemia   . Hypertension   . Raynaud's phenomenon   . TN (trigeminal neuralgia)     Past Surgical History:  Procedure Laterality Date  . BREAST BIOPSY    . BREAST ENHANCEMENT SURGERY    . CHOLECYSTECTOMY N/A 02/14/2014   Procedure: LAPAROSCOPIC CHOLECYSTECTOMY;  Surgeon: ARalene Ok MD;  [place]  Service: General;  Laterality: N/A;  . ERCP N/A 03/08/2015   Procedure: ENDOSCOPIC RETROGRADE CHOLANGIOPANCREATOGRAPHY (ERCP);  Surgeon: MLadene Artist MD;  [facility];  Service: Endoscopy;  Laterality: N/A;  . MASS EXCISION Right 12/25/2016   Procedure: EXCISION RIGHT SCALP MASS SEBACEOUS CYST;  Surgeon: DCoralie Keens MD;  [place]  Service: General;  Laterality: Right;  . TOTAL ABDOMINAL HYSTERECTOMY  1963   for cervical cancer in situ    Prior to Admission medications   Medication Sig Start Date End Date Taking? Authorizing Provider  aspirin EC 81 MG tablet Take 81 mg by mouth at bedtime.   Yes [provider]  atorvastatin (LIPITOR) 20 MG tablet TAKE ONE TABLET BY MOUTH IN THE MORNING 04/14/17  Yes KMarletta Lor MD  carboxymethylcellulose (REFRESH PLUS) 0.5 % SOLN Place 1 drop into both eyes 2 (two) times daily.   Yes [provider]  lisinopril (PRINIVIL,ZESTRIL) 20 MG tablet TAKE ONE TABLET BY MOUTH TWICE DAILY 09/10/17  Yes KMarletta Lor MD  metFORMIN (GLUCOPHAGE) 500 MG tablet TAKE 1 TABLET BY MOUTH TWICE  DAILY WITH FOOD Patient taking differently: TAKE 1 TABLET BY MOUTH DAILY WITH FOOD 01/29/18  Yes KMarletta Lor MD  Multiple Vitamins-Minerals (WOMENS 50+ [place] Creek[place]ITAMIN/MIN PO) Take 1 tablet by mouth daily with lunch.    Yes [provider]  omeprazole (PRILOSEC) 20 MG capsule Take 1 capsule (20 mg total) by mouth daily. 11/24/17  Yes KMarletta Lor MD  TURMERIC CURCUMIN PO Take 1 capsule by mouth daily with lunch.    Yes [provider]  Blood Glucose Monitoring Suppl (ONE TOUCH ULTRA MINI) w/Device KIT USE TO  CHECK BLOOD SUGAR DAILY AND AS NEEDED 10/01/16   Marletta Lor, MD  Lancets [facility] ULTRASOFT) lancets Once daily or PRN, Dx E11.9 04/14/15   Marletta Lor, MD  meclizine (ANTIVERT) 25 MG tablet TAKE 1 TABLET BY MOUTH THREE TIMES DAILY AS NEEDED FOR DIZZINESS Patient not taking: Reported on 03/24/2018 11/24/17   Marletta Lor, MD  ONE TOUCH ULTRA TEST test strip USE  STRIP TO CHECK GLUCOSE ONCE DAILY AS DIRECTED 10/01/16   Marletta Lor, MD  [facility] DELICA LANCETS 33K MISC USE TO CHECK BLOOD SUGAR ONCE DAILY 08/05/16   Marletta Lor, MD    Scheduled Meds: . aspirin EC  325 mg Oral Daily  . enoxaparin (LOVENOX) injection  40 mg Subcutaneous Q24H  . insulin aspart  0-9 Units Subcutaneous TID WC  . lisinopril  20 mg Oral BID  . metoprolol tartrate  25 mg Oral BID  . pantoprazole  40 mg Oral Daily   Infusions: . sodium chloride Stopped (03/24/18 2032)   PRN Meds: acetaminophen, hydrALAZINE, nitroGLYCERIN, ondansetron (ZOFRAN) IV   Allergies as of 03/24/2018 - Review Complete 03/24/2018  Allergen Reaction Noted  . Morphine and related Itching 08/02/2014    Family History  Problem Relation Age of Onset  . CAD Neg Hx   . Diabetes Mellitus II Neg Hx     Social History   Social History Narrative   Married.  Lives in [place] with her husband.  No FH of heart disease.   1 adult son   + smoker   No EtOH      REVIEW OF SYSTEMS: Constitutional: Generally patient has good energy.  She goes to the gym and performs weightbearing exercise and aerobic exercise 3 or 4 days a week. ENT:  No nose bleeds Pulm: Denies shortness of breath despite smoking a pack of cigarettes a day for many years. CV: Chest pain as described in HPI.  no palpitations, no LE edema.  GU:  No hematuria, no frequency.  No dark-colored urine. GI:  Per HPI Heme: No unusual bleeding or bruising. Transfusions: None Neuro:  No headaches, no peripheral tingling or numbness.  No dizziness, no syncope. Derm:  No itching, no rash or sores.  Endocrine:  No sweats or chills.  No polyuria or dysuria Immunization: Reviewed.  She is up-to-date on flu shot and multiple other vaccinations. Travel:  None beyond local counties in last few months.    PHYSICAL EXAM: Vital signs in last 24 hours: Vitals:   03/25/18 1400 03/25/18 1607  BP: (!) 198/57 (!) 141/74  Pulse: 76   Resp: 20 19  Temp:  98.3 F (36.8 C)  SpO2: 94% 95%   Wt Readings from Last 3 Encounters:  03/25/18 100 lb 1.6 oz (45.4 kg)  11/24/17 101 lb 9.6 oz (46.1 kg)  09/30/17 97 lb 9.6 oz (44.3 kg)    General: Pleasant, well-appearing, well-preserved 84 year old Asian female.   Head: No facial asymmetry or swelling.  No signs of head trauma. Eyes: No scleral icterus.  No conjunctival pallor.  EOMI. Ears: Not hard of hearing. Nose: No congestion or discharge. Mouth: Oral mucosa moist, pink.  Some nonspecific white coating on the tongue.  Tongue midline. Neck: No JVD, no masses, no thyromegaly. Lungs: Diminished breath sounds but no adventitious breath sounds.  No cough.  No dyspnea. Heart: RRR.  No MRG.  S1, S2 present Abdomen: Soft.  Not tender or distended.  No masses.  No HSM.  No masses.  No  bruits.  No organomegaly..   Rectal: Deferred Musc/Skeltl: No joint redness or swelling.  Slight arthritic changes in the fingers. Extremities: No CCE. Neurologic:  Alert.  Good historian.  Oriented x3.  No limb weakness or tremor.  No gross neurologic deficits. Skin: No jaundice.  No rash or sores. Tattoos: None observed. Nodes: No cervical adenopathy. Psych: Cooperative, pleasant, calm.  Fluid speech.  Intake/Output from previous day: 04/30 0701 - 05/01 0700 In: 600 [I.V.:600] Out: -  Intake/Output this shift: No intake/output data recorded.  LAB RESULTS: Recent Labs    03/24/18 1930 03/25/18 0352  WBC 7.2 6.3  HGB 15.7* 14.5  HCT 47.1* 42.7  PLT 202 187   BMET Lab Results  Component Value Date   NA 140 03/24/2018   NA 142 11/24/2017   NA 142 02/04/2017   K 3.5 03/24/2018   K 3.8 11/24/2017   K 4.1 02/04/2017   CL 105 03/24/2018   CL 105 11/24/2017   CL 104 02/04/2017   CO2 23 03/24/2018   CO2 25 11/24/2017   CO2 28 02/04/2017   GLUCOSE 127 (H) 03/24/2018   GLUCOSE 215 (H) 11/24/2017   GLUCOSE 237 (H) 02/04/2017   BUN 11 03/24/2018   BUN 16 11/24/2017   BUN 14 02/04/2017   CREATININE 0.84 03/25/2018   CREATININE 0.93 03/24/2018   CREATININE 0.88 11/24/2017   CALCIUM 9.4 03/24/2018   CALCIUM 9.7 11/24/2017   CALCIUM 10.1 02/04/2017   LFT Recent Labs    03/24/18 1930  PROT 7.0  ALBUMIN 3.9  AST 885*  ALT 418*  ALKPHOS 213*  BILITOT 0.7   PT/INR Lab Results  Component Value Date   INR 0.90 03/24/2018  Lipase     Component Value Date/Time   LIPASE 169 (H) 03/24/2018 1956     RADIOLOGY STUDIES: Dg Chest 2 View  Result Date: 03/24/2018 CLINICAL DATA:  84 y/o F; central chest pain, shortness of breath, and nausea for 1 day. EXAM: CHEST - 2 VIEW COMPARISON:  08/29/2017 chest radiograph FINDINGS: Stable normal cardiac silhouette given projection and technique. Aortic atherosclerosis with calcification. Clear lungs. No pleural effusion or pneumothorax. No acute osseous abnormality is evident. S-shaped curvature of the spine. IMPRESSION: No acute pulmonary process identified.  Aortic atherosclerosis.  Electronically Signed   By: Kristine Garbe M.D.   On: 03/24/2018 18:21   Mr 3d Recon At Scanner  Result Date: 03/25/2018 CLINICAL DATA:  Elevated liver function tests. Biliary ductal dilatation. Abdominal pain. EXAM: MRI ABDOMEN WITHOUT AND WITH CONTRAST (INCLUDING MRCP) TECHNIQUE: Multiplanar multisequence MR imaging of the abdomen was performed both before and after the administration of intravenous contrast. Heavily T2-weighted images of the biliary and pancreatic ducts were obtained, and three-dimensional MRCP images were rendered by post processing. CONTRAST:  73m MULTIHANCE GADOBENATE DIMEGLUMINE 529 MG/ML IV SOLN COMPARISON:  MRI on 03/09/2015 FINDINGS: Image degradation by motion artifact noted. Lower chest: No acute findings. Hepatobiliary: No hepatic masses identified. Prior cholecystectomy again noted. Diffuse biliary ductal dilatation, with increased size of common bile duct which now measures 17 mm, compared to 12 mm on previous study. No evidence of choledocholithiasis. Stricture of the distal common bile duct cannot be excluded. Pancreas: No mass or inflammatory changes. Slight increase in dilatation of the pancreatic duct measuring up to 5 mm in the pancreatic head, compared to 4 mm previously. However there is no evidence of pancreatic mass or inflammatory changes. Spleen:  Within normal limits in size and appearance. Adrenals/Urinary Tract: No masses identified.  No evidence of hydronephrosis. Stomach/Bowel: Colonic diverticulosis noted, without evidence of diverticulitis involving the visualized portions. Vascular/Lymphatic: No pathologically enlarged lymph nodes identified. No abdominal aortic aneurysm. Other:  None. Musculoskeletal:  No suspicious bone lesions identified. IMPRESSION: Increased diffuse biliary ductal dilatation with common bile duct measuring 17 mm. No evidence of choledocholithiasis. Possible stricture of the distal common bile duct. Consider ERCP for further  evaluation. Slight increase in pancreatic ductal dilatation, without evidence of pancreatic mass or pancreas divisum. Electronically Signed   By: Earle Gell M.D.   On: 03/25/2018 14:19   Mr Abdomen Mrcp Moise Boring Contast  Result Date: 03/25/2018 CLINICAL DATA:  Elevated liver function tests. Biliary ductal dilatation. Abdominal pain. EXAM: MRI ABDOMEN WITHOUT AND WITH CONTRAST (INCLUDING MRCP) TECHNIQUE: Multiplanar multisequence MR imaging of the abdomen was performed both before and after the administration of intravenous contrast. Heavily T2-weighted images of the biliary and pancreatic ducts were obtained, and three-dimensional MRCP images were rendered by post processing. CONTRAST:  51m MULTIHANCE GADOBENATE DIMEGLUMINE 529 MG/ML IV SOLN COMPARISON:  MRI on 03/09/2015 FINDINGS: Image degradation by motion artifact noted. Lower chest: No acute findings. Hepatobiliary: No hepatic masses identified. Prior cholecystectomy again noted. Diffuse biliary ductal dilatation, with increased size of common bile duct which now measures 17 mm, compared to 12 mm on previous study. No evidence of choledocholithiasis. Stricture of the distal common bile duct cannot be excluded. Pancreas: No mass or inflammatory changes. Slight increase in dilatation of the pancreatic duct measuring up to 5 mm in the pancreatic head, compared to 4 mm previously. However there is no evidence of pancreatic mass or inflammatory changes. Spleen:  Within normal limits in size and appearance. Adrenals/Urinary Tract: No masses identified. No evidence of hydronephrosis. Stomach/Bowel: Colonic diverticulosis noted, without evidence of diverticulitis involving the visualized portions. Vascular/Lymphatic: No pathologically enlarged lymph nodes identified. No abdominal aortic aneurysm. Other:  None. Musculoskeletal:  No suspicious bone lesions identified. IMPRESSION: Increased diffuse biliary ductal dilatation with common bile duct measuring 17 mm. No  evidence of choledocholithiasis. Possible stricture of the distal common bile duct. Consider ERCP for further evaluation. Slight increase in pancreatic ductal dilatation, without evidence of pancreatic mass or pancreas divisum. Electronically Signed   By: JEarle GellM.D.   On: 03/25/2018 14:19   [place]domen Limited Ruq  Result Date: 03/25/2018 CLINICAL DATA:  Elevated liver enzymes EXAM: ULTRASOUND ABDOMEN LIMITED RIGHT UPPER QUADRANT COMPARISON:  Abdominal MRI February 06, 2015 FINDINGS: Gallbladder: Surgically absent Common bile duct: Diameter: 9 mm proximally with slight dilatation to 11 mm distally. This finding is likely due to post cholecystectomy state with the distal common bile mildly dilated even for post cholecystectomy state. No biliary duct mass or calculus evident. There is also a degree of intrahepatic biliary duct dilatation. Liver: No focal lesion identified. Within normal limits in parenchymal echogenicity. Portal vein is patent on color Doppler imaging with normal direction of blood flow towards the liver. IMPRESSION: Biliary duct dilatation, a finding also present on prior 2016 MR study. No biliary duct mass or calculus evident. No liver lesions evident beyond intrahepatic biliary duct dilatation. Gallbladder absent. Electronically Signed   By: WLowella GripIII M.D.   On: 03/25/2018 10:26

## 2018-03-25 NOTE — Consult Note (Signed)
Cardiology Consult    Patient ID: Riva Sesma MRN: [id number], DOB/AGE: 02/26/1934   Admit date: 03/24/2018 Date of Consult: 03/25/2018  Primary Physician: Marletta Lor, MD Primary Cardiologist: New Requesting Provider: Dr. Hal Hope Reason for Consultation: Chest pain  [place] Natarajan is a 84 y.o. female who is being seen today for the evaluation of chest pain at the request of Dr. Hal Hope.   Patient Profile    84 yo female with PMH of DM, HTN, HL, and raynaud's disease who presented with chest pain. Found to have elevated LFTs.   Past Medical History   Past Medical History:  Diagnosis Date  . Diabetes mellitus, type 2 ([place])   . History of cervical cancer   . Hyperlipidemia   . Hypertension   . Raynaud's phenomenon   . TN (trigeminal neuralgia)     Past Surgical History:  Procedure Laterality Date  . BREAST BIOPSY    . BREAST ENHANCEMENT SURGERY    . CHOLECYSTECTOMY N/A 02/14/2014   Procedure: LAPAROSCOPIC CHOLECYSTECTOMY;  Surgeon: Ralene Ok, MD;  Location: [place];  Service: General;  Laterality: N/A;  . ERCP N/A 03/08/2015   Procedure: ENDOSCOPIC RETROGRADE CHOLANGIOPANCREATOGRAPHY (ERCP);  Surgeon: Ladene Artist, MD;  Location: [facility];  Service: Endoscopy;  Laterality: N/A;  . MASS EXCISION Right 12/25/2016   Procedure: EXCISION RIGHT SCALP MASS SEBACEOUS CYST;  Surgeon: Coralie Keens, MD;  Location: [place];  Service: General;  Laterality: Right;  . TOTAL ABDOMINAL HYSTERECTOMY  1963   for cervical cancer in situ     Allergies  Allergies  Allergen Reactions  . Morphine And Related Itching    Short lived, mild itching.    History of Present Illness    Mrs. Tesoriero is an 84 yo female with PMH of DM, HTN, HL, and raynaud's disease. Reports she does think she was seen by a cardiologist about a year ago while in the hospital but does not remember undergoing any cardiac testing. Denies any family hx of CAD.  She is a smoker.   She reports having an episode chest pain about 3-4 months ago while at rest similar to what brought her in this admission. Yesterday morning reports she was awoken from sleep around 6am with left sided chest pain that was sharp in nature. She got up and walking to the living room and sat in the chair. Felt nauseated but no vomiting. No shortness of breath or diaphoresis. Symptoms lasted for several hours, and she took some OTC medication but does not remember the name. Had an appt at her PCP and went to that. While in the office she experienced another episode of chest pain, and EMS was called to transfer to the ED.   In the ED her labs showed stable electrolytes, Trop negative x2, AST 885, ALT 418, lipase 169, Hgb 15.7. CXR was negative. EKG showed SR with slight ST depression in v4-v6. Abd US showed increase dilation of CBD from 33m to 129m no CBD stones or filling defect. She was seen by GI and had a MRCP with results pending. No further chest pain since admission.   Inpatient Medications    . aspirin EC  325 mg Oral Daily  . enoxaparin (LOVENOX) injection  40 mg Subcutaneous Q24H  . insulin aspart  0-9 Units Subcutaneous TID WC  . lisinopril  20 mg Oral BID  . pantoprazole  40 mg Oral Daily    Family History    Family History  Problem Relation Age of Onset  .  CAD Neg Hx   . Diabetes Mellitus II Neg Hx     Social History    Social History   Socioeconomic History  . Marital status: Married    Spouse name: [name]  . Number of children: 1  . Years of education: Not on file  . Highest education level: Not on file  Occupational History  . 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 Financial resource strain: Not on file  . Food insecurity:    Worry: Not on file    Inability: Not on file  . Transportation needs:    Medical: Not on file    Non-medical: Not on file  Tobacco Use  . Smoking status: Current Every Day Smoker    Packs/day:  1.50    Years: 60.00    Pack years: 90.00    Types: Cigarettes  . Smokeless tobacco: Never Used  Substance and Sexual Activity  . Alcohol use: No  . Drug use: No  . Sexual activity: Not on file  Lifestyle  . Physical activity:    Days per week: Not on file    Minutes per session: Not on file  . Stress: Not on file  Relationships  . Social connections:    Talks on phone: Not on file    Gets together: Not on file    Attends religious service: Not on file    Active member of club or organization: Not on file    Attends meetings of clubs or organizations: Not on file    Relationship status: Not on file  . Intimate partner violence:    Fear of current or ex partner: Not on file    Emotionally abused: Not on file    Physically abused: Not on file    Forced sexual activity: Not on file  Other Topics Concern  . Not on file  Social History Narrative   Married.  Lives in [place] with her husband.  No FH of heart disease.     Review of Systems    See HPI  All other systems reviewed and are otherwise negative except as noted above.  Physical Exam    Blood pressure (!) 198/57, pulse 76, temperature 98.8 F (37.1 C), temperature source Oral, resp. rate 20, SpO2 94 %.  General: Pleasant older female, NAD Psych: Normal affect. Neuro: Alert and oriented X 3. Moves all extremities spontaneously. HEENT: Normal  Neck: Supple without bruits or JVD. Lungs:  Resp regular and unlabored, CTA. Heart: RRR no s3, s4, or murmurs. Abdomen: Soft, non-tender, non-distended, BS + x 4.  Extremities: No clubbing, cyanosis or edema. DP/PT/Radials 2+ and equal bilaterally.  Labs    Troponin (Point of Care Test) No results for input(s): TROPIPOC in the last 72 hours. Recent Labs    03/24/18 1930 03/25/18 0352  TROPONINI <0.03 <0.03   Lab Results  Component Value Date   WBC 6.3 03/25/2018   HGB 14.5 03/25/2018   HCT 42.7 03/25/2018   MCV 95.7 03/25/2018   PLT 187 03/25/2018      Recent Labs  Lab 03/24/18 1930 03/25/18 0352  NA 140  --   K 3.5  --   CL 105  --   CO2 23  --   BUN 11  --   CREATININE 0.93 0.84  CALCIUM 9.4  --   PROT 7.0  --   BILITOT 0.7  --   ALKPHOS 213*  --   ALT 418*  --  AST 885*  --   GLUCOSE 127*  --    Lab Results  Component Value Date   CHOL 173 03/24/2018   HDL 63 03/24/2018   LDLCALC 82 03/24/2018   TRIG 142 03/24/2018   No results found for: [facility]   Radiology Studies    Dg Chest 2 View  Result Date: 03/24/2018 CLINICAL DATA:  84 y/o F; central chest pain, shortness of breath, and nausea for 1 day. EXAM: CHEST - 2 VIEW COMPARISON:  08/29/2017 chest radiograph FINDINGS: Stable normal cardiac silhouette given projection and technique. Aortic atherosclerosis with calcification. Clear lungs. No pleural effusion or pneumothorax. No acute osseous abnormality is evident. S-shaped curvature of the spine. IMPRESSION: No acute pulmonary process identified.  Aortic atherosclerosis. Electronically Signed   By: Kristine Garbe M.D.   On: 03/24/2018 18:21   Mr 3d Recon At Scanner  Result Date: 03/25/2018 CLINICAL DATA:  Elevated liver function tests. Biliary ductal dilatation. Abdominal pain. EXAM: MRI ABDOMEN WITHOUT AND WITH CONTRAST (INCLUDING MRCP) TECHNIQUE: Multiplanar multisequence MR imaging of the abdomen was performed both before and after the administration of intravenous contrast. Heavily T2-weighted images of the biliary and pancreatic ducts were obtained, and three-dimensional MRCP images were rendered by post processing. CONTRAST:  22m MULTIHANCE GADOBENATE DIMEGLUMINE 529 MG/ML IV SOLN COMPARISON:  MRI on 03/09/2015 FINDINGS: Image degradation by motion artifact noted. Lower chest: No acute findings. Hepatobiliary: No hepatic masses identified. Prior cholecystectomy again noted. Diffuse biliary ductal dilatation, with increased size of common bile duct which now measures 17 mm, compared to 12 mm on previous study. No  evidence of choledocholithiasis. Stricture of the distal common bile duct cannot be excluded. Pancreas: No mass or inflammatory changes. Slight increase in dilatation of the pancreatic duct measuring up to 5 mm in the pancreatic head, compared to 4 mm previously. However there is no evidence of pancreatic mass or inflammatory changes. Spleen:  Within normal limits in size and appearance. Adrenals/Urinary Tract: No masses identified. No evidence of hydronephrosis. Stomach/Bowel: Colonic diverticulosis noted, without evidence of diverticulitis involving the visualized portions. Vascular/Lymphatic: No pathologically enlarged lymph nodes identified. No abdominal aortic aneurysm. Other:  None. Musculoskeletal:  No suspicious bone lesions identified. IMPRESSION: Increased diffuse biliary ductal dilatation with common bile duct measuring 17 mm. No evidence of choledocholithiasis. Possible stricture of the distal common bile duct. Consider ERCP for further evaluation. Slight increase in pancreatic ductal dilatation, without evidence of pancreatic mass or pancreas divisum. Electronically Signed   By: JEarle GellM.D.   On: 03/25/2018 14:19   Mr Abdomen Mrcp WMoise BoringContast  Result Date: 03/25/2018 CLINICAL DATA:  Elevated liver function tests. Biliary ductal dilatation. Abdominal pain. EXAM: MRI ABDOMEN WITHOUT AND WITH CONTRAST (INCLUDING MRCP) TECHNIQUE: Multiplanar multisequence MR imaging of the abdomen was performed both before and after the administration of intravenous contrast. Heavily T2-weighted images of the biliary and pancreatic ducts were obtained, and three-dimensional MRCP images were rendered by post processing. CONTRAST:  963mMULTIHANCE GADOBENATE DIMEGLUMINE 529 MG/ML IV SOLN COMPARISON:  MRI on 03/09/2015 FINDINGS: Image degradation by motion artifact noted. Lower chest: No acute findings. Hepatobiliary: No hepatic masses identified. Prior cholecystectomy again noted. Diffuse biliary ductal dilatation,  with increased size of common bile duct which now measures 17 mm, compared to 12 mm on previous study. No evidence of choledocholithiasis. Stricture of the distal common bile duct cannot be excluded. Pancreas: No mass or inflammatory changes. Slight increase in dilatation of the pancreatic duct measuring up to 5 mm  in the pancreatic head, compared to 4 mm previously. However there is no evidence of pancreatic mass or inflammatory changes. Spleen:  Within normal limits in size and appearance. Adrenals/Urinary Tract: No masses identified. No evidence of hydronephrosis. Stomach/Bowel: Colonic diverticulosis noted, without evidence of diverticulitis involving the visualized portions. Vascular/Lymphatic: No pathologically enlarged lymph nodes identified. No abdominal aortic aneurysm. Other:  None. Musculoskeletal:  No suspicious bone lesions identified. IMPRESSION: Increased diffuse biliary ductal dilatation with common bile duct measuring 17 mm. No evidence of choledocholithiasis. Possible stricture of the distal common bile duct. Consider ERCP for further evaluation. Slight increase in pancreatic ductal dilatation, without evidence of pancreatic mass or pancreas divisum. Electronically Signed   By: Earle Gell M.D.   On: 03/25/2018 14:19   US Abdomen Limited Ruq  Result Date: 03/25/2018 CLINICAL DATA:  Elevated liver enzymes EXAM: ULTRASOUND ABDOMEN LIMITED RIGHT UPPER QUADRANT COMPARISON:  Abdominal MRI February 06, 2015 FINDINGS: Gallbladder: Surgically absent Common bile duct: Diameter: 9 mm proximally with slight dilatation to 11 mm distally. This finding is likely due to post cholecystectomy state with the distal common bile mildly dilated even for post cholecystectomy state. No biliary duct mass or calculus evident. There is also a degree of intrahepatic biliary duct dilatation. Liver: No focal lesion identified. Within normal limits in parenchymal echogenicity. Portal vein is patent on color Doppler imaging with  normal direction of blood flow towards the liver. IMPRESSION: Biliary duct dilatation, a finding also present on prior 2016 MR study. No biliary duct mass or calculus evident. No liver lesions evident beyond intrahepatic biliary duct dilatation. Gallbladder absent. Electronically Signed   By: Lowella Grip III M.D.   On: 03/25/2018 10:26    ECG & Cardiac Imaging    EKG:  The EKG was personally reviewed and demonstrates SR with slight ST depression in v4-v6  Echo: pending  Assessment & Plan    84 yo female with PMH of DM, HTN, HL, and raynaud's disease who presented with chest pain. Found to have elevated LFTs.   1. Chest Pain: She reports 2 separate episodes one about 3-4 months ago and then again yesterday morning. Episode yesterday morning lasted a couple of hours then subsided. Took some OTC medications but doesn't remember the name of medications. Denies any family hx, but does have CRFs including tobacco use, HTN, HL and DM. EKG is slightly abnormal with ST depression in v4-v6. Neg trop x2. Would be reasonable to consider stress testing, or coronary CT. Currently undergoing GI work up at this time. Further cardiac testing can be done as outpatient. -- echo pending   2. Elevated LFTs: s/p lap chole 3/15. AST >800, lipase 169, Alk phos 213. Abd [place] with further dilated CBD. Seen by GI and just returned from MRCP, results pending.  - statin on hold  3. HTN: blood pressure is uncontrolled. Appears only home medication is lisinopril 57m. Will add metoprolol 280mBID as HR is also elevated.   4. HL: on lipitor 2038maily, but this has been held in the setting of elevated LFTS.  5. NIDDM: on metformin at home. Hgb A1c 7.0 on admission. SSI while inpatient  Signed, LinReino BellisP-C Pager [phone]/2019, 2:49 PM

## 2018-03-25 NOTE — H&P (Signed)
History and Physical    [name] WSF:[id number] DOB: [birth year] DOA: 03/24/2018  PCP: Marletta Lor, MD  Patient coming from: Home.  Chief Complaint: Chest pain.  HPI: [name] is a 84 y.o. female with history of hypertension, hyperlipidemia and diabetes mellitus was referred to the ER from primary care physician's office for chest pain.  Patient states that since waking up in the morning patient had substernal chest pressure which lasted for around 2 to 3 hours and resolved.  Later when patient was at the physician's office and was exerting patient started developing chest pressure again.  Patient was referred to the ER.  Denies any nausea vomiting abdominal pain shortness of breath productive cough fever or chills.  ED Course: In the ER patient was found to have market hypertension and was given labetalol IV following which blood pressure improved patient also was given 1 dose of lisinopril which he takes at home.  Following which blood pressure improved.  Presently chest pain-free.  EKG shows lateral minimal ST depression troponin was negative.  Patient admitted for chest pain concerning for angina.  Review of Systems: As per HPI, rest all negative.   Past Medical History:  Diagnosis Date  . Diabetes mellitus, type 2 ([place])   . History of cervical cancer   . Hyperlipidemia   . Hypertension   . Raynaud's phenomenon   . TN (trigeminal neuralgia)     Past Surgical History:  Procedure Laterality Date  . BREAST BIOPSY    . BREAST ENHANCEMENT SURGERY    . CHOLECYSTECTOMY N/A 02/14/2014   Procedure: LAPAROSCOPIC CHOLECYSTECTOMY;  Surgeon: Ralene Ok, MD;  Location: [facility];  Service: General;  Laterality: N/A;  . ERCP N/A 03/08/2015   Procedure: ENDOSCOPIC RETROGRADE CHOLANGIOPANCREATOGRAPHY (ERCP);  Surgeon: Ladene Artist, MD;  Location: [facility];  Service: Endoscopy;  Laterality: N/A;  . MASS EXCISION Right 12/25/2016   Procedure:  EXCISION RIGHT SCALP MASS SEBACEOUS CYST;  Surgeon: Coralie Keens, MD;  Location: [place];  Service: General;  Laterality: Right;  . TOTAL ABDOMINAL HYSTERECTOMY  1963   for cervical cancer in situ     reports that she has been smoking cigarettes.  She has a 90.00 pack-year smoking history. She has never used smokeless tobacco. She reports that she does not drink alcohol or use drugs.  Allergies  Allergen Reactions  . Morphine And Related Itching    Short lived, mild itching.    Family History  Problem Relation Age of Onset  . CAD Neg Hx   . Diabetes Mellitus II Neg Hx     Prior to Admission medications   Medication Sig Start Date End Date Taking? Authorizing Provider  aspirin EC 81 MG tablet Take 81 mg by mouth at bedtime.   Yes [provider]  atorvastatin (LIPITOR) 20 MG tablet TAKE ONE TABLET BY MOUTH IN THE MORNING 04/14/17  Yes Marletta Lor, MD  carboxymethylcellulose (REFRESH PLUS) 0.5 % SOLN Place 1 drop into both eyes 2 (two) times daily.   Yes [provider]  lisinopril (PRINIVIL,ZESTRIL) 20 MG tablet TAKE ONE TABLET BY MOUTH TWICE DAILY 09/10/17  Yes Marletta Lor, MD  metFORMIN (GLUCOPHAGE) 500 MG tablet TAKE 1 TABLET BY MOUTH TWICE DAILY WITH FOOD Patient taking differently: TAKE 1 TABLET BY MOUTH DAILY WITH FOOD 01/29/18  Yes Marletta Lor, MD  Multiple Vitamins-Minerals (WOMENS 50+ [place] VITAMIN/MIN PO) Take 1 tablet by mouth daily with lunch.    Yes [provider]  omeprazole (PRILOSEC) 20 MG capsule Take 1 capsule (20 mg total) by mouth daily. 11/24/17  Yes Marletta Lor, MD  TURMERIC CURCUMIN PO Take 1 capsule by mouth daily with lunch.    Yes [provider]  Blood Glucose Monitoring Suppl (ONE TOUCH ULTRA MINI) w/Device KIT USE TO CHECK BLOOD SUGAR DAILY AND AS NEEDED 10/01/16   Marletta Lor, MD  Lancets [facility] ULTRASOFT) lancets Once daily or PRN, Dx E11.9 04/14/15   Marletta Lor, MD   meclizine (ANTIVERT) 25 MG tablet TAKE 1 TABLET BY MOUTH THREE TIMES DAILY AS NEEDED FOR DIZZINESS Patient not taking: Reported on 03/24/2018 11/24/17   Marletta Lor, MD  ONE TOUCH ULTRA TEST test strip USE  STRIP TO CHECK GLUCOSE ONCE DAILY AS DIRECTED 10/01/16   Marletta Lor, MD  [facility] DELICA LANCETS 65K MISC USE TO CHECK BLOOD SUGAR ONCE DAILY 08/05/16   Marletta Lor, MD    Physical Exam: Vitals:   03/24/18 2345 03/25/18 0030 03/25/18 0115 03/25/18 0237  BP: (!) 170/61 (!) 167/79 (!) 151/68   Pulse: 73 74 69 86  Resp: (!) 21 20 (!) 24 14  Temp:      TempSrc:      SpO2: 95% 92% 93% 96%      Constitutional: Moderately built and nourished. Vitals:   03/24/18 2345 03/25/18 0030 03/25/18 0115 03/25/18 0237  BP: (!) 170/61 (!) 167/79 (!) 151/68   Pulse: 73 74 69 86  Resp: (!) 21 20 (!) 24 14  Temp:      TempSrc:      SpO2: 95% 92% 93% 96%   Eyes: Anicteric no pallor. ENMT: No discharge from the ears eyes nose or mouth. Neck: No mass palpated no JVD appreciated. Respiratory: No rhonchi or crepitations. Cardiovascular: S1-S2 heard no murmurs appreciated. Abdomen: Soft nontender bowel sounds present. Musculoskeletal: No edema.  No joint effusion. Skin: No rash.  Skin appears warm. Neurologic: Alert awake oriented to time place and person.  Moves all activities. Psychiatric: Appears normal.  Normal affect.   Labs on Admission: I have personally reviewed following labs and imaging studies  CBC: Recent Labs  Lab 03/24/18 1930  WBC 7.2  NEUTROABS 5.8  HGB 15.7*  HCT 47.1*  MCV 96.3  PLT 812   Basic Metabolic Panel: Recent Labs  Lab 03/24/18 1930  NA 140  K 3.5  CL 105  CO2 23  GLUCOSE 127*  BUN 11  CREATININE 0.93  CALCIUM 9.4   GFR: CrCl cannot be calculated (Unknown ideal weight.). Liver Function Tests: Recent Labs  Lab 03/24/18 1930  AST 885*  ALT 418*  ALKPHOS 213*  BILITOT 0.7  PROT 7.0  ALBUMIN 3.9   No results for  input(s): LIPASE, AMYLASE in the last 168 hours. No results for input(s): AMMONIA in the last 168 hours. Coagulation Profile: Recent Labs  Lab 03/24/18 1930  INR 0.90   Cardiac Enzymes: Recent Labs  Lab 03/24/18 1930  TROPONINI <0.03   BNP (last 3 results) No results for input(s): PROBNP in the last 8760 hours. HbA1C: Recent Labs    03/24/18 1514  HGBA1C 7.0   CBG: No results for input(s): GLUCAP in the last 168 hours. Lipid Profile: Recent Labs    03/24/18 1930  CHOL 173  HDL 63  LDLCALC 82  TRIG 142  CHOLHDL 2.7   Thyroid Function Tests: No results for input(s): TSH, T4TOTAL, FREET4, T3FREE, THYROIDAB in the last 72 hours. Anemia Panel: No results  for input(s): VITAMINB12, FOLATE, FERRITIN, TIBC, IRON, RETICCTPCT in the last 72 hours. Urine analysis:    Component Value Date/Time   COLORURINE YELLOW 08/02/2014 1139   APPEARANCEUR CLEAR 08/02/2014 1139   LABSPEC 1.019 08/02/2014 1139   PHURINE 6.0 08/02/2014 1139   GLUCOSEU NEGATIVE 08/02/2014 1139   HGBUR NEGATIVE 08/02/2014 1139   BILIRUBINUR Neg 02/04/2017 1412   KETONESUR NEGATIVE 08/02/2014 1139   PROTEINUR Neg 02/04/2017 1412   PROTEINUR NEGATIVE 08/02/2014 1139   UROBILINOGEN negative (A) 02/04/2017 1412   UROBILINOGEN 0.2 08/02/2014 1139   NITRITE Neg 02/04/2017 1412   NITRITE NEGATIVE 08/02/2014 1139   LEUKOCYTESUR Negative 02/04/2017 1412   Sepsis Labs: '@LABRCNTIP' (procalcitonin:4,lacticidven:4) )No results found for this or any previous visit (from the past 240 hour(s)).   Radiological Exams on Admission: Dg Chest 2 View  Result Date: 03/24/2018 CLINICAL DATA:  84 y/o F; central chest pain, shortness of breath, and nausea for 1 day. EXAM: CHEST - 2 VIEW COMPARISON:  08/29/2017 chest radiograph FINDINGS: Stable normal cardiac silhouette given projection and technique. Aortic atherosclerosis with calcification. Clear lungs. No pleural effusion or pneumothorax. No acute osseous abnormality is  evident. S-shaped curvature of the spine. IMPRESSION: No acute pulmonary process identified.  Aortic atherosclerosis. Electronically Signed   By: Kristine Garbe M.D.   On: 03/24/2018 18:21    EKG: Independently reviewed.  Normal sinus rhythm with minimal ST depression in the lateral leads.  Assessment/Plan Principal Problem:   Chest pain Active Problems:   Dyslipidemia   Diabetes mellitus type 2 in nonobese [facility])   Hypertensive urgency    1. Chest pain concerning for angina -we will cycle cardiac markers keep patient on PRN nitroglycerin aspirin patient is on statins.  Will keep patient n.p.o. in a.m. in anticipation of cardiac procedure.  Cardiology notified. 2. Hypertensive urgency -patient is on lisinopril twice daily.  Which will be continued.  PRN IV hydralazine.  We will add beta-blockers probably after stress test if planned. 3. Diabetes mellitus type 2 -we will keep patient on sliding scale coverage for now. 4. Hyperlipidemia on statins.   DVT prophylaxis: Lovenox. Code Status: Full code. Family Communication: Patient's daughter. Disposition Plan: Home. Consults called: Cardiology. Admission status: Observation.   Rise Patience MD [facility] Pager [phone].  If 7PM-7AM, please contact night-coverage [email or web address] Password [facility]  03/25/2018, 2:39 AM

## 2018-03-25 NOTE — ED Notes (Signed)
Pt returned from US at this time.

## 2018-03-25 NOTE — Progress Notes (Signed)
PROGRESS NOTE  Vedanshi Massaro  SAY:[id number] DOB: [birth year] DOA: 03/24/2018 PCP: Marletta Lor, MD  Outpatient Specialists: GI, Dr. Fuller Plan Brief Narrative: Kilee Hedding is a 84 y.o. female with a history of T2DM, HTN, HLD, and choledocholithiasis who presented to the ED 4/30 by ambulance from PCP's office for exertional chest pain. BP was significantly elevated and ECG showed some minimal lateral ST depression, though enzymes have remained negative. Cardiology was consulted and she was placed in observation. Lab work from admission showed elevated LFTs and lipase, though the patient denies any GI-specific symptoms. Plan per GI consultant is MRCP today.   Assessment & Plan: Principal Problem:   Chest pain Active Problems:   Dyslipidemia   Diabetes mellitus type 2 in nonobese [facility])   Hypertensive urgency  Chest pain:  - ASA. NTG prn. Holding statin due to LFT elevation.  - Echo - Cardiology consulted by admitting MD - Remain NPO for now (pt not hungry)  Elevated LFTs: With AST 2x > ALT and elevated lipase, suspect biliary obstruction. Her chest pain may be the only attributable symptom for this. No new toxins, EtOH, tylenol, fever or h/o hepatitis.  - GI consulted, planning MRCP. Possibly ERCP 5/2.  - Hold statin - Hepatitis panel ordered and U/S ordered  Hypertensive urgency: Improved with restarting home medications.  - Continue lisinopril and prn hydralazine  T2DM:  - Hold home medications - Sensitive SSI  Hyperlipidemia: LDL 82.  - Holding statin as above.   DVT prophylaxis: Lovenox Code Status: Full Family Communication: Brother and Niece at bedside Disposition Plan: Home when work up complete.   Consultants:   GI  Cardiology  Procedures:   None  Antimicrobials:  None   Subjective: No chest pain or shortness of breath or palpitations. Further denies N/V/D, abdominal pain. Has noticed some lower back pain and bilateral thigh pain  over the past several months.  Objective: Vitals:   03/25/18 0715 03/25/18 0915 03/25/18 1130 03/25/18 1131  BP: (!) 161/58 (!) 152/67 (!) 175/58 (!) 175/58  Pulse: 82 87 86 84  Resp: 20 18 (!) 22 (!) 22  Temp:      TempSrc:      SpO2: 96% 96% 96% 97%    Intake/Output Summary (Last 24 hours) at 03/25/2018 1321 Last data filed at 03/24/2018 2032 Gross per 24 hour  Intake 600 ml  Output -  Net 600 ml   There were no vitals filed for this visit.  Gen: 84 y.o. female in no distress  Pulm: Non-labored breathing room air. Clear to auscultation bilaterally.  CV: Regular rate and rhythm. No murmur, rub, or gallop. No JVD, no pedal edema. GI: Abdomen soft, non-tender, non-distended, with normoactive bowel sounds. No organomegaly or masses felt. Ext: Warm, no deformities Skin: No rashes, lesions no ulcers Neuro: Alert and oriented. No focal neurological deficits. Psych: Judgement and insight appear normal. Mood & affect appropriate.   Data Reviewed: I have personally reviewed following labs and imaging studies  CBC: Recent Labs  Lab 03/24/18 1930 03/25/18 0352  WBC 7.2 6.3  NEUTROABS 5.8  --   HGB 15.7* 14.5  HCT 47.1* 42.7  MCV 96.3 95.7  PLT 202 235   Basic Metabolic Panel: Recent Labs  Lab 03/24/18 1930 03/25/18 0352  NA 140  --   K 3.5  --   CL 105  --   CO2 23  --   GLUCOSE 127*  --   BUN 11  --   CREATININE 0.93 0.84  CALCIUM 9.4  --    GFR: CrCl cannot be calculated (Unknown ideal weight.). Liver Function Tests: Recent Labs  Lab 03/24/18 1930  AST 885*  ALT 418*  ALKPHOS 213*  BILITOT 0.7  PROT 7.0  ALBUMIN 3.9   Recent Labs  Lab 03/24/18 1956  LIPASE 169*   No results for input(s): AMMONIA in the last 168 hours. Coagulation Profile: Recent Labs  Lab 03/24/18 1930  INR 0.90   Cardiac Enzymes: Recent Labs  Lab 03/24/18 1930 03/25/18 0352  TROPONINI <0.03 <0.03   BNP (last 3 results) No results for input(s): PROBNP in the last 8760  hours. HbA1C: Recent Labs    03/24/18 1514  HGBA1C 7.0   CBG: Recent Labs  Lab 03/25/18 0315 03/25/18 0758  GLUCAP 98 107*   Lipid Profile: Recent Labs    03/24/18 1930  CHOL 173  HDL 63  LDLCALC 82  TRIG 142  CHOLHDL 2.7   Thyroid Function Tests: No results for input(s): TSH, T4TOTAL, FREET4, T3FREE, THYROIDAB in the last 72 hours. Anemia Panel: No results for input(s): VITAMINB12, FOLATE, FERRITIN, TIBC, IRON, RETICCTPCT in the last 72 hours. Urine analysis:    Component Value Date/Time   COLORURINE YELLOW 08/02/2014 1139   APPEARANCEUR CLEAR 08/02/2014 1139   LABSPEC 1.019 08/02/2014 1139   PHURINE 6.0 08/02/2014 1139   GLUCOSEU NEGATIVE 08/02/2014 1139   HGBUR NEGATIVE 08/02/2014 1139   BILIRUBINUR Neg 02/04/2017 1412   KETONESUR NEGATIVE 08/02/2014 1139   PROTEINUR Neg 02/04/2017 1412   PROTEINUR NEGATIVE 08/02/2014 1139   UROBILINOGEN negative (A) 02/04/2017 1412   UROBILINOGEN 0.2 08/02/2014 1139   NITRITE Neg 02/04/2017 1412   NITRITE NEGATIVE 08/02/2014 1139   LEUKOCYTESUR Negative 02/04/2017 1412   No results found for this or any previous visit (from the past 240 hour(s)).    Radiology Studies: Dg Chest 2 View  Result Date: 03/24/2018 CLINICAL DATA:  84 y/o F; central chest pain, shortness of breath, and nausea for 1 day. EXAM: CHEST - 2 VIEW COMPARISON:  08/29/2017 chest radiograph FINDINGS: Stable normal cardiac silhouette given projection and technique. Aortic atherosclerosis with calcification. Clear lungs. No pleural effusion or pneumothorax. No acute osseous abnormality is evident. S-shaped curvature of the spine. IMPRESSION: No acute pulmonary process identified.  Aortic atherosclerosis. Electronically Signed   By: Kristine Garbe M.D.   On: 03/24/2018 18:21   US Abdomen Limited Ruq  Result Date: 03/25/2018 CLINICAL DATA:  Elevated liver enzymes EXAM: ULTRASOUND ABDOMEN LIMITED RIGHT UPPER QUADRANT COMPARISON:  Abdominal MRI February 06, 2015 FINDINGS: Gallbladder: Surgically absent Common bile duct: Diameter: 9 mm proximally with slight dilatation to 11 mm distally. This finding is likely due to post cholecystectomy state with the distal common bile mildly dilated even for post cholecystectomy state. No biliary duct mass or calculus evident. There is also a degree of intrahepatic biliary duct dilatation. Liver: No focal lesion identified. Within normal limits in parenchymal echogenicity. Portal vein is patent on color Doppler imaging with normal direction of blood flow towards the liver. IMPRESSION: Biliary duct dilatation, a finding also present on prior 2016 MR study. No biliary duct mass or calculus evident. No liver lesions evident beyond intrahepatic biliary duct dilatation. Gallbladder absent. Electronically Signed   By: Lowella Grip III M.D.   On: 03/25/2018 10:26    Scheduled Meds: . aspirin EC  325 mg Oral Daily  . enoxaparin (LOVENOX) injection  40 mg Subcutaneous Q24H  . insulin aspart  0-9 Units Subcutaneous TID WC  .  lisinopril  20 mg Oral BID  . pantoprazole  40 mg Oral Daily   Continuous Infusions: . sodium chloride Stopped (03/24/18 2032)     LOS: 0 days   Time spent: 25 minutes.  Patrecia Pour, MD [facility] Pager [phone]  If 7PM-7AM, please contact night-coverage [email or web address] Password [email or web address] 03/25/2018, 1:21 PM

## 2018-03-25 NOTE — ED Notes (Signed)
Pt returned from MRI at this time

## 2018-03-25 NOTE — ED Notes (Signed)
Patient transported to Ultrasound 

## 2018-03-26 ENCOUNTER — Other Ambulatory Visit: Payer: Self-pay

## 2018-03-26 ENCOUNTER — Encounter: Payer: Self-pay | Admitting: Nurse Practitioner

## 2018-03-26 ENCOUNTER — Other Ambulatory Visit: Payer: Self-pay | Admitting: Physician Assistant

## 2018-03-26 DIAGNOSIS — R945 Abnormal results of liver function studies: Secondary | ICD-10-CM | POA: Diagnosis not present

## 2018-03-26 DIAGNOSIS — R7989 Other specified abnormal findings of blood chemistry: Secondary | ICD-10-CM

## 2018-03-26 DIAGNOSIS — I160 Hypertensive urgency: Secondary | ICD-10-CM | POA: Diagnosis not present

## 2018-03-26 DIAGNOSIS — K851 Biliary acute pancreatitis without necrosis or infection: Secondary | ICD-10-CM | POA: Diagnosis not present

## 2018-03-26 DIAGNOSIS — K838 Other specified diseases of biliary tract: Secondary | ICD-10-CM | POA: Diagnosis not present

## 2018-03-26 DIAGNOSIS — I10 Essential (primary) hypertension: Secondary | ICD-10-CM | POA: Diagnosis not present

## 2018-03-26 DIAGNOSIS — R079 Chest pain, unspecified: Secondary | ICD-10-CM

## 2018-03-26 DIAGNOSIS — E119 Type 2 diabetes mellitus without complications: Secondary | ICD-10-CM | POA: Diagnosis not present

## 2018-03-26 DIAGNOSIS — E785 Hyperlipidemia, unspecified: Secondary | ICD-10-CM | POA: Diagnosis not present

## 2018-03-26 LAB — HEPATITIS PANEL, ACUTE
HEP A IGM: NEGATIVE
HEP B S AG: NEGATIVE
Hep B C IgM: NEGATIVE

## 2018-03-26 LAB — GLUCOSE, CAPILLARY
GLUCOSE-CAPILLARY: 106 mg/dL — AB (ref 65–99)
Glucose-Capillary: 265 mg/dL — ABNORMAL HIGH (ref 65–99)

## 2018-03-26 MED ORDER — LISINOPRIL 40 MG PO TABS
40.0000 mg | ORAL_TABLET | Freq: Two times a day (BID) | ORAL | Status: DC
  Administered 2018-03-26: 40 mg via ORAL
  Filled 2018-03-26: qty 1

## 2018-03-26 MED ORDER — POTASSIUM CHLORIDE CRYS ER 20 MEQ PO TBCR: 40.0000 meq | EXTENDED_RELEASE_TABLET | Freq: Once | ORAL | Status: DC

## 2018-03-26 NOTE — Discharge Summary (Signed)
Physician Discharge Summary  [name] VZC:[id number] DOB: Dec 28, 1933 DOA: 03/24/2018  PCP: Marletta Lor, MD  Admit date: 03/24/2018 Discharge date: 03/26/2018  Admitted From: Home Disposition: Home   Recommendations for Outpatient Follow-up:  1. Follow up with PCP in 1-2 weeks  2. Follow up with [facility] as scheduled. Referral made for ERCP. Holding statin with elevated LFTs. 3. Follow up with cardiology for outpatient coronary CT vs. myoview.   Home Health: None Equipment/Devices: None Discharge Condition: Stable CODE STATUS: Full Diet recommendation: Heart healthy, carb-modified  Brief/Interim Summary: Anyi Wright is an 84 y.o. female with a history of T2DM, HTN, HLD, and choledocholithiasis who presented to the ED 4/30 by ambulance from PCP's office for exertional chest pain. BP was significantly elevated and ECG showed some minimal lateral ST depression, though enzymes have remained negative. Cardiology was consulted and she was placed in observation. Lab work from admission showed elevated LFTs and lipase, though the patient denies any GI-specific symptoms. US showed dilatation of CBD. MR showed no evidence of choledocholithiasis, possible stricuture of distal CBD. ERCP was recommended, though this was difficult to cannulate in the past. LFTs have significantly improved and her symptoms have resolved. GI consultant has recommended further evaluation as an outpatient, and have scheduled follow up. Cardiology has also recommended outpatient stress testing. She is discharged in stable condition.  Discharge Diagnoses:  Principal Problem:   Chest pain Active Problems:   Dyslipidemia   Acute biliary pancreatitis   Abnormal LFTs   Dilated cbd, acquired   Diabetes mellitus type 2 in nonobese [facility])   Hypertensive urgency   Hypertension  Chest pain: Resolved. Echo cardiogram is unremarkable, so outpatient work up is planned.  - Cardiology planning  outpatient stress testing.   Elevated LFTs, dilated bile ducts without stones: With AST 2x > ALT and elevated lipase, suspect biliary obstruction. Her chest pain may be the only attributable symptom for this. No new toxins, EtOH, tylenol, fever or h/o hepatitis.  - GI consulted, per their note 5/2: "abnormal liver chemistries and dilated bile ducts on imaging. No stones or other abnormalities on MRCP. She could still have CBD stones but papillary stenosis / SOB also consideration. Similar presentation in 2016, we were unable to cannulate duct. Went to [place] but didn't get ERCP because CBD only 63m on EUS.  -she is feeling better, liver chemistries improving. Okay for discharge from GI standpoint.  -Follow up appt made for 5/14. She will need repeat LFTs in interim in one week. I spoke with son / other family in room about plan.  -patient / family will call office in interim for recurrent pain, if she gets dark urine or fevers." - Hold statin  Hypertensive urgency: Improved with restarting home medications.  - Continue lisinopril, will need follow up with PCP for ongoing treatment.  NIDT2DM:  - Restart home medications  Hyperlipidemia: LDL 82.  - Holding statin as above.  Hypokalemia: Mild, replaced. Recheck at follow up  Discharge Instructions Discharge Instructions    Diet - low sodium heart healthy   Complete by:  As directed    Diet Carb Modified   Complete by:  As directed    Discharge instructions   Complete by:  As directed    You were evaluated for abnormal liver function tests and dilated bile ducts on imaging. GI has recommended outpatient follow up, and nothing else to be done while you are in the hospital.   Cardiology also recommends outpatient follow up which has  been arranged.   Follow up with your PCP in the next week or so, or seek medical attention sooner if your symptoms return.   Increase activity slowly   Complete by:  As directed      Allergies as of  03/26/2018      Reactions   Morphine And Related Itching   Short lived, mild itching.      Medication List    STOP taking these medications   atorvastatin 20 MG tablet Commonly known as:  LIPITOR     TAKE these medications   aspirin EC 81 MG tablet Take 81 mg by mouth at bedtime.   carboxymethylcellulose 0.5 % Soln Commonly known as:  REFRESH PLUS Place 1 drop into both eyes 2 (two) times daily.   lisinopril 20 MG tablet Commonly known as:  PRINIVIL,ZESTRIL TAKE ONE TABLET BY MOUTH TWICE DAILY   meclizine 25 MG tablet Commonly known as:  ANTIVERT TAKE 1 TABLET BY MOUTH THREE TIMES DAILY AS NEEDED FOR DIZZINESS   metFORMIN 500 MG tablet Commonly known as:  GLUCOPHAGE TAKE 1 TABLET BY MOUTH TWICE DAILY WITH FOOD What changed:    how much to take  how to take this  when to take this   omeprazole 20 MG capsule Commonly known as:  PRILOSEC Take 1 capsule (20 mg total) by mouth daily.   ONE TOUCH ULTRA MINI w/Device Kit USE TO CHECK BLOOD SUGAR DAILY AND AS NEEDED   ONE TOUCH ULTRA TEST test strip Generic drug:  glucose blood USE  STRIP TO CHECK GLUCOSE ONCE DAILY AS DIRECTED   onetouch ultrasoft lancets Once daily or PRN, Dx L89.2   ONETOUCH DELICA LANCETS 11H Misc USE TO CHECK BLOOD SUGAR ONCE DAILY   TURMERIC CURCUMIN PO Take 1 capsule by mouth daily with lunch.   WOMENS 50+ MULTI VITAMIN/MIN PO Take 1 tablet by mouth daily with lunch.      Follow-up Information    Willia Craze, NP Follow up on 04/07/2018.   Specialty:  Gastroenterology Why:  at 2:30. Arrive 10 minutes early Contact information: [place] [address] [phone]        [facility] Follow up on 04/01/2018.   Why:  basement for labs        [facility] Follow up on 04/15/2018.   Specialty:  Cardiology Why:  10:00AM. Cardiac stress test, no food or drink in the morning of the stress test, sips of water with medication ok. No  caffeine for 12 hours prior to the stress test. Arrive 30 min early Contact information: [address], [address] [place] [phone]       Charlie Pitter, PA-C Follow up on 04/29/2018.   Specialties:  Cardiology, Radiology Why:  9:30AM. Cardiology followup with Dr. Bernita Raisin physician assistant Contact information: [address] [place] [address] [phone]          Allergies  Allergen Reactions  . Morphine And Related Itching    Short lived, mild itching.    Consultations:  Cardiology, GI  Procedures/Studies: Dg Chest 2 View  Result Date: 03/24/2018 CLINICAL DATA:  84 y/o F; central chest pain, shortness of breath, and nausea for 1 day. EXAM: CHEST - 2 VIEW COMPARISON:  08/29/2017 chest radiograph FINDINGS: Stable normal cardiac silhouette given projection and technique. Aortic atherosclerosis with calcification. Clear lungs. No pleural effusion or pneumothorax. No acute osseous abnormality is evident. S-shaped curvature of  the spine. IMPRESSION: No acute pulmonary process identified.  Aortic atherosclerosis. Electronically Signed   By: Kristine Garbe M.D.   On: 03/24/2018 18:21   Mr 3d Recon At Scanner  Result Date: 03/25/2018 CLINICAL DATA:  Elevated liver function tests. Biliary ductal dilatation. Abdominal pain. EXAM: MRI ABDOMEN WITHOUT AND WITH CONTRAST (INCLUDING MRCP) TECHNIQUE: Multiplanar multisequence MR imaging of the abdomen was performed both before and after the administration of intravenous contrast. Heavily T2-weighted images of the biliary and pancreatic ducts were obtained, and three-dimensional MRCP images were rendered by post processing. CONTRAST:  76m MULTIHANCE GADOBENATE DIMEGLUMINE 529 MG/ML IV SOLN COMPARISON:  MRI on 03/09/2015 FINDINGS: Image degradation by motion artifact noted. Lower chest: No acute findings. Hepatobiliary: No hepatic masses identified. Prior cholecystectomy  again noted. Diffuse biliary ductal dilatation, with increased size of common bile duct which now measures 17 mm, compared to 12 mm on previous study. No evidence of choledocholithiasis. Stricture of the distal common bile duct cannot be excluded. Pancreas: No mass or inflammatory changes. Slight increase in dilatation of the pancreatic duct measuring up to 5 mm in the pancreatic head, compared to 4 mm previously. However there is no evidence of pancreatic mass or inflammatory changes. Spleen:  Within normal limits in size and appearance. Adrenals/Urinary Tract: No masses identified. No evidence of hydronephrosis. Stomach/Bowel: Colonic diverticulosis noted, without evidence of diverticulitis involving the visualized portions. Vascular/Lymphatic: No pathologically enlarged lymph nodes identified. No abdominal aortic aneurysm. Other:  None. Musculoskeletal:  No suspicious bone lesions identified. IMPRESSION: Increased diffuse biliary ductal dilatation with common bile duct measuring 17 mm. No evidence of choledocholithiasis. Possible stricture of the distal common bile duct. Consider ERCP for further evaluation. Slight increase in pancreatic ductal dilatation, without evidence of pancreatic mass or pancreas divisum. Electronically Signed   By: JEarle GellM.D.   On: 03/25/2018 14:19   Mr Abdomen Mrcp WMoise BoringContast  Result Date: 03/25/2018 CLINICAL DATA:  Elevated liver function tests. Biliary ductal dilatation. Abdominal pain. EXAM: MRI ABDOMEN WITHOUT AND WITH CONTRAST (INCLUDING MRCP) TECHNIQUE: Multiplanar multisequence MR imaging of the abdomen was performed both before and after the administration of intravenous contrast. Heavily T2-weighted images of the biliary and pancreatic ducts were obtained, and three-dimensional MRCP images were rendered by post processing. CONTRAST:  941mMULTIHANCE GADOBENATE DIMEGLUMINE 529 MG/ML IV SOLN COMPARISON:  MRI on 03/09/2015 FINDINGS: Image degradation by motion artifact  noted. Lower chest: No acute findings. Hepatobiliary: No hepatic masses identified. Prior cholecystectomy again noted. Diffuse biliary ductal dilatation, with increased size of common bile duct which now measures 17 mm, compared to 12 mm on previous study. No evidence of choledocholithiasis. Stricture of the distal common bile duct cannot be excluded. Pancreas: No mass or inflammatory changes. Slight increase in dilatation of the pancreatic duct measuring up to 5 mm in the pancreatic head, compared to 4 mm previously. However there is no evidence of pancreatic mass or inflammatory changes. Spleen:  Within normal limits in size and appearance. Adrenals/Urinary Tract: No masses identified. No evidence of hydronephrosis. Stomach/Bowel: Colonic diverticulosis noted, without evidence of diverticulitis involving the visualized portions. Vascular/Lymphatic: No pathologically enlarged lymph nodes identified. No abdominal aortic aneurysm. Other:  None. Musculoskeletal:  No suspicious bone lesions identified. IMPRESSION: Increased diffuse biliary ductal dilatation with common bile duct measuring 17 mm. No evidence of choledocholithiasis. Possible stricture of the distal common bile duct. Consider ERCP for further evaluation. Slight increase in pancreatic ductal dilatation, without evidence of pancreatic mass or pancreas divisum. Electronically Signed  By: Earle Gell M.D.   On: 03/25/2018 14:19   US Abdomen Limited Ruq  Result Date: 03/25/2018 CLINICAL DATA:  Elevated liver enzymes EXAM: ULTRASOUND ABDOMEN LIMITED RIGHT UPPER QUADRANT COMPARISON:  Abdominal MRI February 06, 2015 FINDINGS: Gallbladder: Surgically absent Common bile duct: Diameter: 9 mm proximally with slight dilatation to 11 mm distally. This finding is likely due to post cholecystectomy state with the distal common bile mildly dilated even for post cholecystectomy state. No biliary duct mass or calculus evident. There is also a degree of intrahepatic biliary  duct dilatation. Liver: No focal lesion identified. Within normal limits in parenchymal echogenicity. Portal vein is patent on color Doppler imaging with normal direction of blood flow towards the liver. IMPRESSION: Biliary duct dilatation, a finding also present on prior 2016 MR study. No biliary duct mass or calculus evident. No liver lesions evident beyond intrahepatic biliary duct dilatation. Gallbladder absent. Electronically Signed   By: Lowella Grip III M.D.   On: 03/25/2018 10:26   Subjective: No new complaints. Chest pain is resolved, no dyspnea.   Discharge Exam: Vitals:   03/25/18 2333 03/26/18 0518  BP:  (!) 170/81  Pulse: 65 (!) 58  Resp:  16  Temp:  97.8 F (36.6 C)  SpO2:  96%   General: Pt is alert, awake, not in acute distress Cardiovascular: RRR, S1/S2 +, no rubs, no gallops Respiratory: CTA bilaterally, no wheezing, no rhonchi Abdominal: Soft, NT, ND, bowel sounds + Extremities: No edema, no cyanosis  Labs: BNP (last 3 results) No results for input(s): BNP in the last 8760 hours. Basic Metabolic Panel: Recent Labs  Lab 03/24/18 1930 03/25/18 0352  NA 140  --   K 3.5  --   CL 105  --   CO2 23  --   GLUCOSE 127*  --   BUN 11  --   CREATININE 0.93 0.84  CALCIUM 9.4  --    Liver Function Tests: Recent Labs  Lab 03/24/18 1930 03/25/18 1715  AST 885* 174*  ALT 418* 222*  ALKPHOS 213* 157*  BILITOT 0.7 0.6  PROT 7.0 5.8*  ALBUMIN 3.9 3.1*   Recent Labs  Lab 03/24/18 1956 03/25/18 1715  LIPASE 169* 66*   No results for input(s): AMMONIA in the last 168 hours. CBC: Recent Labs  Lab 03/24/18 1930 03/25/18 0352  WBC 7.2 6.3  NEUTROABS 5.8  --   HGB 15.7* 14.5  HCT 47.1* 42.7  MCV 96.3 95.7  PLT 202 187   Cardiac Enzymes: Recent Labs  Lab 03/24/18 1930 03/25/18 0352 03/25/18 1715 03/25/18 2057  TROPONINI <0.03 <0.03 <0.03 <0.03   BNP: Invalid input(s): POCBNP CBG: Recent Labs  Lab 03/25/18 1358 03/25/18 1628  03/25/18 2046 03/26/18 0733 03/26/18 1153  GLUCAP 81 192* 105* 106* 265*   D-Dimer No results for input(s): DDIMER in the last 72 hours. Hgb A1c Recent Labs    03/24/18 1514  HGBA1C 7.0   Lipid Profile Recent Labs    03/24/18 1930  CHOL 173  HDL 63  LDLCALC 82  TRIG 142  CHOLHDL 2.7   Thyroid function studies No results for input(s): TSH, T4TOTAL, T3FREE, THYROIDAB in the last 72 hours.  Invalid input(s): FREET3 Anemia work up No results for input(s): VITAMINB12, FOLATE, FERRITIN, TIBC, IRON, RETICCTPCT in the last 72 hours. Urinalysis    Component Value Date/Time   COLORURINE YELLOW 08/02/2014 1139   APPEARANCEUR CLEAR 08/02/2014 1139   LABSPEC 1.019 08/02/2014 1139   PHURINE 6.0 08/02/2014  [place] 08/02/2014 [place] 08/02/2014 1139   BILIRUBINUR Neg 02/04/2017 [place] 08/02/2014 1139   PROTEINUR Neg 02/04/2017 1412   PROTEINUR NEGATIVE 08/02/2014 1139   UROBILINOGEN negative (A) 02/04/2017 1412   UROBILINOGEN 0.2 08/02/2014 1139   NITRITE Neg 02/04/2017 1412   NITRITE NEGATIVE 08/02/2014 1139   LEUKOCYTESUR Negative 02/04/2017 1412    Microbiology No results found for this or any previous visit (from the past 240 hour(s)).  Time coordinating discharge: Approximately 40 minutes  Patrecia Pour, MD  [facility] 03/26/2018, 1:03 PM Pager [phone]

## 2018-03-26 NOTE — Care Management Obs Status (Signed)
[place] NOTIFICATION   Patient Details  Name: [name] MRN: [id number] Date of Birth: 06/17/1934   Medicare Observation Status Notification Given:  Yes    Bethena Roys, RN 03/26/2018, 11:22 AM

## 2018-03-26 NOTE — Progress Notes (Addendum)
     [facility] Progress Note   Chief Complaint:   Chest pain, abnormal liver chemistries / dilaled bile ducts   SUBJECTIVE:    feels much better. No further chest pain.    ASSESSMENT AND PLAN:    84 yo female with chest pain / abnormal liver chemistries and dilated bile ducts on imaging. No stones or other abnormalities on MRCP. She could still have CBD stones but papillary stenosis / SOB also consideration. Similar presentation in 2016, we were unable to cannulate duct. Went to [place] but didn't get ERCP because CBD only 9mm on EUS.  -she is feeling better, liver chemistries improving. Okay for discharge from GI standpoint.  -Follow up appt made for 5/14. She will need repeat LFTs in interim in one week. I spoke with son / other family in room about plan.  -patient / family will call office in interim for recurrent pain, if she gets dark urine or fevers.   Discharge Planning Diet: no GI restrictions Follow up: in computer   Odin Attending   I have taken an interval history, reviewed the chart and examined the patient. I agree with the Advanced Practitioner's note, impression and recommendations.    Will consider tertiary evaluation vs wait for Dr. Ihor Austin to see her in august to consider ERCP and biliary sphincterotomy   Gatha Mayer, MD, [facility] 03/26/2018 11:08 AM  OBJECTIVE:     Vital signs in last 24 hours: Temp:  [97.8 F (36.6 C)-98.4 F (36.9 C)] 97.8 F (36.6 C) (05/02 0518) Pulse Rate:  [58-87] 58 (05/02 0518) Resp:  [16-24] 16 (05/02 0518) BP: (122-198)/(57-147) 170/81 (05/02 0518) SpO2:  [94 %-98 %] 96 % (05/02 0518) Weight:  [100 lb (45.4 kg)-100 lb 1.6 oz (45.4 kg)] 100 lb (45.4 kg) (05/02 0518) Last BM Date: 03/25/18 General:   Alert, Asian female in NAD EENT:  Normal hearing, non icteric sclera, conjunctive pink.  Heart:  Regular rate and rhythm; no murmurs. No lower extremity edema Pulm: Normal  respiratory effort, lungs CTA bilaterally without wheezes or crackles. Abdomen:  Soft, nondistended, nontender.  Normal bowel sounds, no masses felt. No hepatomegaly.    Neurologic:  Alert and  oriented x4;  grossly normal neurologically. Psych:  Pleasant, cooperative.  Normal mood and affect.    Lab Results:  LFT Recent Labs    03/25/18 1715  PROT 5.8*  ALBUMIN 3.1*  AST 174*  ALT 222*  ALKPHOS 157*  BILITOT 0.6  BILIDIR 0.2  IBILI 0.4     LOS: 0 days   Tye Savoy ,NP 03/26/2018, 9:11 AM

## 2018-03-26 NOTE — Progress Notes (Addendum)
Progress Note  Patient Name: [name] Date of Encounter: 03/26/2018  Primary Cardiologist: [name], MD   Subjective   No further CP or SOB. Denies any recent abdominal pain with food.   Inpatient Medications    Scheduled Meds: . aspirin EC  325 mg Oral Daily  . enoxaparin (LOVENOX) injection  40 mg Subcutaneous Q24H  . insulin aspart  0-9 Units Subcutaneous TID WC  . lisinopril  20 mg Oral BID  . metoprolol tartrate  25 mg Oral BID  . pantoprazole  40 mg Oral Daily   Continuous Infusions: . sodium chloride Stopped (03/24/18 2032)   PRN Meds: acetaminophen, hydrALAZINE, nitroGLYCERIN, ondansetron (ZOFRAN) IV   Vital Signs    Vitals:   03/25/18 1607 03/25/18 2055 03/25/18 2333 03/26/18 0518  BP: (!) 141/74 (!) 122/92  (!) 170/81  Pulse:  80 65 (!) 58  Resp: 19 18  16   Temp: 98.3 F (36.8 C) 98.4 F (36.9 C)  97.8 F (36.6 C)  TempSrc: Oral Oral  Oral  SpO2: 95% 98%  96%  Weight:    100 lb (45.4 kg)  Height:        Intake/Output Summary (Last 24 hours) at 03/26/2018 0924 Last data filed at 03/25/2018 2339 Gross per 24 hour  Intake 358 ml  Output -  Net 358 ml   Filed Weights   03/25/18 1604 03/26/18 0518  Weight: 100 lb 1.6 oz (45.4 kg) 100 lb (45.4 kg)    Telemetry    NSR, HR 50s at night, improved to 60-80s this morning - Personally Reviewed  ECG    NSR, minimal ST depression in lateral leads, more noticeable in lead V6 - Personally Reviewed  Physical Exam   GEN: No acute distress.   Neck: No JVD Cardiac: RRR, no murmurs, rubs, or gallops.  Respiratory: Clear to auscultation bilaterally. GI: Soft, nontender, non-distended  MS: No edema; No deformity. Neuro:  Nonfocal  Psych: Normal affect   Labs    Chemistry Recent Labs  Lab 03/24/18 1930 03/25/18 0352 03/25/18 1715  NA 140  --   --   K 3.5  --   --   CL 105  --   --   CO2 23  --   --   GLUCOSE 127*  --   --   BUN 11  --   --   CREATININE 0.93 0.84  --     CALCIUM 9.4  --   --   PROT 7.0  --  5.8*  ALBUMIN 3.9  --  3.1*  AST 885*  --  174*  ALT 418*  --  222*  ALKPHOS 213*  --  157*  BILITOT 0.7  --  0.6  GFRNONAA 56* >60  --   GFRAA >60 >60  --   ANIONGAP 12  --   --      Hematology Recent Labs  Lab 03/24/18 1930 03/25/18 0352  WBC 7.2 6.3  RBC 4.89 4.46  HGB 15.7* 14.5  HCT 47.1* 42.7  MCV 96.3 95.7  MCH 32.1 32.5  MCHC 33.3 34.0  RDW 13.6 13.9  PLT 202 187    Cardiac Enzymes Recent Labs  Lab 03/24/18 1930 03/25/18 0352 03/25/18 1715 03/25/18 2057  TROPONINI <0.03 <0.03 <0.03 <0.03   No results for input(s): TROPIPOC in the last 168 hours.   BNPNo results for input(s): BNP, PROBNP in the last 168 hours.   DDimer No results for input(s): DDIMER in the last 168  hours.   Radiology    Dg Chest 2 View  Result Date: 03/24/2018 CLINICAL DATA:  84 y/o F; central chest pain, shortness of breath, and nausea for 1 day. EXAM: CHEST - 2 VIEW COMPARISON:  08/29/2017 chest radiograph FINDINGS: Stable normal cardiac silhouette given projection and technique. Aortic atherosclerosis with calcification. Clear lungs. No pleural effusion or pneumothorax. No acute osseous abnormality is evident. S-shaped curvature of the spine. IMPRESSION: No acute pulmonary process identified.  Aortic atherosclerosis. Electronically Signed   By: Kristine Garbe M.D.   On: 03/24/2018 18:21   Mr 3d Recon At Scanner  Result Date: 03/25/2018 CLINICAL DATA:  Elevated liver function tests. Biliary ductal dilatation. Abdominal pain. EXAM: MRI ABDOMEN WITHOUT AND WITH CONTRAST (INCLUDING MRCP) TECHNIQUE: Multiplanar multisequence MR imaging of the abdomen was performed both before and after the administration of intravenous contrast. Heavily T2-weighted images of the biliary and pancreatic ducts were obtained, and three-dimensional MRCP images were rendered by post processing. CONTRAST:  54mL MULTIHANCE GADOBENATE DIMEGLUMINE 529 MG/ML IV SOLN  COMPARISON:  MRI on 03/09/2015 FINDINGS: Image degradation by motion artifact noted. Lower chest: No acute findings. Hepatobiliary: No hepatic masses identified. Prior cholecystectomy again noted. Diffuse biliary ductal dilatation, with increased size of common bile duct which now measures 17 mm, compared to 12 mm on previous study. No evidence of choledocholithiasis. Stricture of the distal common bile duct cannot be excluded. Pancreas: No mass or inflammatory changes. Slight increase in dilatation of the pancreatic duct measuring up to 5 mm in the pancreatic head, compared to 4 mm previously. However there is no evidence of pancreatic mass or inflammatory changes. Spleen:  Within normal limits in size and appearance. Adrenals/Urinary Tract: No masses identified. No evidence of hydronephrosis. Stomach/Bowel: Colonic diverticulosis noted, without evidence of diverticulitis involving the visualized portions. Vascular/Lymphatic: No pathologically enlarged lymph nodes identified. No abdominal aortic aneurysm. Other:  None. Musculoskeletal:  No suspicious bone lesions identified. IMPRESSION: Increased diffuse biliary ductal dilatation with common bile duct measuring 17 mm. No evidence of choledocholithiasis. Possible stricture of the distal common bile duct. Consider ERCP for further evaluation. Slight increase in pancreatic ductal dilatation, without evidence of pancreatic mass or pancreas divisum. Electronically Signed   By: Earle Gell M.D.   On: 03/25/2018 14:19   Mr Abdomen Mrcp Moise Boring Contast  Result Date: 03/25/2018 CLINICAL DATA:  Elevated liver function tests. Biliary ductal dilatation. Abdominal pain. EXAM: MRI ABDOMEN WITHOUT AND WITH CONTRAST (INCLUDING MRCP) TECHNIQUE: Multiplanar multisequence MR imaging of the abdomen was performed both before and after the administration of intravenous contrast. Heavily T2-weighted images of the biliary and pancreatic ducts were obtained, and three-dimensional MRCP  images were rendered by post processing. CONTRAST:  53mL MULTIHANCE GADOBENATE DIMEGLUMINE 529 MG/ML IV SOLN COMPARISON:  MRI on 03/09/2015 FINDINGS: Image degradation by motion artifact noted. Lower chest: No acute findings. Hepatobiliary: No hepatic masses identified. Prior cholecystectomy again noted. Diffuse biliary ductal dilatation, with increased size of common bile duct which now measures 17 mm, compared to 12 mm on previous study. No evidence of choledocholithiasis. Stricture of the distal common bile duct cannot be excluded. Pancreas: No mass or inflammatory changes. Slight increase in dilatation of the pancreatic duct measuring up to 5 mm in the pancreatic head, compared to 4 mm previously. However there is no evidence of pancreatic mass or inflammatory changes. Spleen:  Within normal limits in size and appearance. Adrenals/Urinary Tract: No masses identified. No evidence of hydronephrosis. Stomach/Bowel: Colonic diverticulosis noted, without evidence of diverticulitis  involving the visualized portions. Vascular/Lymphatic: No pathologically enlarged lymph nodes identified. No abdominal aortic aneurysm. Other:  None. Musculoskeletal:  No suspicious bone lesions identified. IMPRESSION: Increased diffuse biliary ductal dilatation with common bile duct measuring 17 mm. No evidence of choledocholithiasis. Possible stricture of the distal common bile duct. Consider ERCP for further evaluation. Slight increase in pancreatic ductal dilatation, without evidence of pancreatic mass or pancreas divisum. Electronically Signed   By: Earle Gell M.D.   On: 03/25/2018 14:19   US Abdomen Limited Ruq  Result Date: 03/25/2018 CLINICAL DATA:  Elevated liver enzymes EXAM: ULTRASOUND ABDOMEN LIMITED RIGHT UPPER QUADRANT COMPARISON:  Abdominal MRI February 06, 2015 FINDINGS: Gallbladder: Surgically absent Common bile duct: Diameter: 9 mm proximally with slight dilatation to 11 mm distally. This finding is likely due to post  cholecystectomy state with the distal common bile mildly dilated even for post cholecystectomy state. No biliary duct mass or calculus evident. There is also a degree of intrahepatic biliary duct dilatation. Liver: No focal lesion identified. Within normal limits in parenchymal echogenicity. Portal vein is patent on color Doppler imaging with normal direction of blood flow towards the liver. IMPRESSION: Biliary duct dilatation, a finding also present on prior 2016 MR study. No biliary duct mass or calculus evident. No liver lesions evident beyond intrahepatic biliary duct dilatation. Gallbladder absent. Electronically Signed   By: Lowella Grip III M.D.   On: 03/25/2018 10:26    Cardiac Studies   Echo 03/25/2018 - Left ventricle: The cavity size was normal. There was mild   concentric hypertrophy. Systolic function was normal. Wall motion   was normal; there were no regional wall motion abnormalities.   Doppler parameters are consistent with abnormal left ventricular   relaxation (grade 1 diastolic dysfunction). Doppler parameters   are consistent with elevated mean left atrial filling pressure. - Mitral valve: Calcified annulus. There was moderate regurgitation   directed centrally. - Left atrium: The atrium was mildly dilated. - Pulmonary arteries: PA peak pressure: 32 mm Hg (S).  Patient Profile     84 y.o. female with PMH of DM, HTN, HL, and raynaud's disease presented with chest pain. Trop negative, however transaminase were elevated. EKG showed slight ST depression in V4-V6. Abd US showed increased dilatation of CBD, GI seen and plan for MRCP.   Assessment & Plan    1. Chest pain  - Echo 03/25/2018 showed normal systolic function, mild LVH, grade 1 DD, moderate MR, PA peak pressure 32 mmHg  - chest pain atypical, last episode was yesterday which lasted for 2 hours, serial enzyme negative, EKG showed slight ST depression in V4-V6. Possibly referred pain from abdomen. Given stable echo.  Plan for possible outpatient coronary CT with FFR vs myoview later. Further GI work while inpatient  2. Elevated LFT  - US showed dilatation of CBD. MR showed no evidence of choledocholithiasis, possible stricuture of distal CBD, consider ERCP  3. HTN: BP still elevated, consider addition of amlodipine  4. HLD: lipitor on hold given elevated transaminase  5. NIDDM: SSI  6. Hypokalemia: K 3.1,  Per primary team  For questions or updates, please contact [place] Please consult [email or web address] for contact info under Cardiology/STEMI.   Hilbert Corrigan, PA  03/26/2018, 9:24 AM     The patient was seen, examined and discussed with Almyra Deforest, PA-C and I agree with the above.   84 year old female with h/o DM, HLP, HTN who presented to ER with complaints of exertional chest pain.  Trop negative but noted to have elevated LFTs.  Abd US showed dilated CBD but no stone.  No stones or other abnormalities on MRCP. GI cleared for discharge. CP resolved.  ECG shows SR, no significant ST T wave abnormalities. Atorvastatin was held. She is hypertensive, I would increase lisinopril to 40 mg po daily. LFTs are improving. Echo showed normal LVEF, mild LVH, grade 1 DD, moderate MR, borderline pulmonary hypertension.  We will discharge and plan for an outpatient stress test followed by an outpatient follow up.  Ena Dawley, MD 03/26/2018

## 2018-03-26 NOTE — Progress Notes (Signed)
Patient's SBP is 170/81 this morning.  I have given her hydralazine 10 mg IV and will keep monitoring patient.

## 2018-03-27 ENCOUNTER — Telehealth: Payer: Self-pay | Admitting: *Deleted

## 2018-03-27 NOTE — Telephone Encounter (Signed)
Unable to reach patient at time of TCM Call. Left message for patient to return call when available.  

## 2018-03-31 ENCOUNTER — Encounter (HOSPITAL_COMMUNITY): Payer: PPO

## 2018-04-01 ENCOUNTER — Other Ambulatory Visit (INDEPENDENT_AMBULATORY_CARE_PROVIDER_SITE_OTHER): Payer: PPO

## 2018-04-01 DIAGNOSIS — R7989 Other specified abnormal findings of blood chemistry: Secondary | ICD-10-CM

## 2018-04-01 DIAGNOSIS — R945 Abnormal results of liver function studies: Secondary | ICD-10-CM

## 2018-04-01 LAB — HEPATIC FUNCTION PANEL
ALBUMIN: 3.6 g/dL (ref 3.5–5.2)
ALT: 24 U/L (ref 0–35)
AST: 15 U/L (ref 0–37)
Alkaline Phosphatase: 85 U/L (ref 39–117)
Bilirubin, Direct: 0.1 mg/dL (ref 0.0–0.3)
TOTAL PROTEIN: 6.3 g/dL (ref 6.0–8.3)
Total Bilirubin: 0.3 mg/dL (ref 0.2–1.2)

## 2018-04-07 ENCOUNTER — Ambulatory Visit (INDEPENDENT_AMBULATORY_CARE_PROVIDER_SITE_OTHER): Payer: PPO | Admitting: Nurse Practitioner

## 2018-04-07 ENCOUNTER — Encounter: Payer: Self-pay | Admitting: Nurse Practitioner

## 2018-04-07 VITALS — BP 144/82 | HR 64 | Ht <= 58 in | Wt 98.2 lb

## 2018-04-07 DIAGNOSIS — K838 Other specified diseases of biliary tract: Secondary | ICD-10-CM | POA: Diagnosis not present

## 2018-04-07 DIAGNOSIS — R079 Chest pain, unspecified: Secondary | ICD-10-CM

## 2018-04-07 NOTE — Progress Notes (Signed)
      IMPRESSION and PLAN:     84 year old female admitted last month for chest pain, abnormal liver chemistries and biliary duct dilation in the absence of choledocholithiasis. She could have papillary stenosis.  Similar presentation in 2016 at which time ERCP unsucessful due to failure to cannulate duct.  Referred to tertiary care but ERCP not done as CBD on EUS was only 26mm.  -During this recent admission we entertained referral back to tertiary care center for ERCP with biliary sphincterotomy.   -At some point patient may still need ERCP with biliary sphincterotomy but she is not interested in pursuing this right now. Her liver chemistries have normalized.  -Happy to see her back. She knows to call for worsening chest pain / abdominal pain / fevers or jaundice     HPI:    Chief Complaint: hospital follow up   Patient is an 84 year old female known to Dr. Fuller Plan. In 2016 patient had choledocholithiasis, we attempted ERCP but were unable to cannulate bile duct. She was referred to [facility] for ERCP/EUS.  At [facility] she never did have an ERCP as EUS showed CBD to be only 8 mm and no other findings.  Patient was recently re-hospitalized with non-exertional chest pain /abnormal liver chemistries and biliary duct dilation.  MRCP showed a 17 mm CBD and 5 mm PD.  No stones or other pathology.   We felt it was in her best interest to be referred back to a tertiary care center for ERCP instead of re-attempting ourselves. Her chest pain was in fact felt to be from bile duct obstruction.  Evaluated by Cardiology, echocardiogram showed normal systolic function, grade 1 diastolic dysfunction, moderate mitral regurgitation. She did have some slight ST depression on an EKG.  Cardiology planned for possible outpatient coronary CT versus Myoview .  Her chest pain resolved, liver chemistries improved and patient /family preferred holding off on the referral/transfer.  She was instead made a hospital  follow-up for which she is here today.   Patient continues to have some intermittent non-exertional chest discomfort. She has an upcoming appt with Cardiology. Not having any abdominal pain. No nausea or vomiting. Her appetite if poor but weight down only a couple of pounds.   Review of systems:   No sob, no fevers. No urinary sx    Past Medical History:  Diagnosis Date  . Diabetes mellitus, type 2 ([place])   . History of cervical cancer   . Hyperlipidemia   . Hypertension   . Raynaud's phenomenon   . TN (trigeminal neuralgia)     Patient's surgical history, family medical history, social history, medications and allergies were all reviewed in Epic    Physical Exam:     BP (!) 144/82   Pulse 64   Ht 4' 7.5" (1.41 m)   Wt 98 lb 3.2 oz (44.5 kg)   BMI 22.41 kg/m   GENERAL:  Petite female in NAD PSYCH: :Pleasant, cooperative, normal affect EENT:  conjunctiva pink, mucous membranes moist, neck supple without masses CARDIAC:  RRR, no peripheral edema PULM: Normal respiratory effort, lungs CTA bilaterally, no wheezing ABDOMEN:  Nondistended, soft, nontender. No obvious masses, no hepatomegaly,  normal bowel sounds SKIN:  turgor, no lesions seen Musculoskeletal:  Normal muscle tone, normal strength NEURO: Alert and oriented x 3, no focal neurologic deficits   Tye Savoy , NP 04/07/2018, 3:09 PM

## 2018-04-07 NOTE — Patient Instructions (Signed)
If you are age 82 or older, your body mass index should be between 23-30. Your Body mass index is 22.41 kg/m. If this is out of the aforementioned range listed, please consider follow up with your Primary Care Provider.  If you are age 68 or younger, your body mass index should be between 19-25. Your Body mass index is 22.41 kg/m. If this is out of the aformentioned range listed, please consider follow up with your Primary Care Provider.   Follow up as needed.  Thank you for choosing me and [facility].   Tye Savoy, NP

## 2018-04-10 ENCOUNTER — Encounter: Payer: Self-pay | Admitting: Nurse Practitioner

## 2018-04-13 ENCOUNTER — Telehealth (HOSPITAL_COMMUNITY): Payer: Self-pay | Admitting: *Deleted

## 2018-04-13 NOTE — Telephone Encounter (Signed)
Patient given detailed instructions per Myocardial Perfusion Study Information Sheet for the test on 04/13/18 Patient notified to arrive 15 minutes early and that it is imperative to arrive on time for appointment to keep from having the test rescheduled.  If you need to cancel or reschedule your appointment, please call the office within 24 hours of your appointment. . Patient verbalized understanding. [name]    

## 2018-04-13 NOTE — Progress Notes (Signed)
Reviewed and agree with initial management plan.  Rudolph Daoust T. Hiyab Nhem, MD FACG 

## 2018-04-15 ENCOUNTER — Ambulatory Visit (HOSPITAL_COMMUNITY): Payer: PPO | Attending: Internal Medicine

## 2018-04-15 VITALS — Ht <= 58 in | Wt 100.0 lb

## 2018-04-15 DIAGNOSIS — R9439 Abnormal result of other cardiovascular function study: Secondary | ICD-10-CM | POA: Diagnosis not present

## 2018-04-15 DIAGNOSIS — R079 Chest pain, unspecified: Secondary | ICD-10-CM

## 2018-04-15 DIAGNOSIS — I2510 Atherosclerotic heart disease of native coronary artery without angina pectoris: Secondary | ICD-10-CM | POA: Insufficient documentation

## 2018-04-15 DIAGNOSIS — I10 Essential (primary) hypertension: Secondary | ICD-10-CM | POA: Insufficient documentation

## 2018-04-15 DIAGNOSIS — R0609 Other forms of dyspnea: Secondary | ICD-10-CM | POA: Insufficient documentation

## 2018-04-15 DIAGNOSIS — R110 Nausea: Secondary | ICD-10-CM

## 2018-04-15 DIAGNOSIS — E119 Type 2 diabetes mellitus without complications: Secondary | ICD-10-CM | POA: Diagnosis not present

## 2018-04-15 LAB — MYOCARDIAL PERFUSION IMAGING
CHL CUP NUCLEAR SRS: 12
CHL CUP NUCLEAR SSS: 17
CSEPPHR: 100 {beats}/min
LV dias vol: 53 mL (ref 46–106)
LV sys vol: 27 mL
RATE: 0.21
Rest HR: 72 {beats}/min
SDS: 6
TID: 1.11

## 2018-04-15 MED ORDER — AMINOPHYLLINE 25 MG/ML IV SOLN
75.0000 mg | Freq: Once | INTRAVENOUS | Status: AC
  Administered 2018-04-15: 75 mg via INTRAVENOUS

## 2018-04-15 MED ORDER — REGADENOSON 0.4 MG/5ML IV SOLN
0.4000 mg | Freq: Once | INTRAVENOUS | Status: AC
  Administered 2018-04-15: 0.4 mg via INTRAVENOUS

## 2018-04-15 MED ORDER — TECHNETIUM TC 99M TETROFOSMIN IV KIT
8.6000 | PACK | Freq: Once | INTRAVENOUS | Status: AC | PRN
  Administered 2018-04-15: 8.6 via INTRAVENOUS
  Filled 2018-04-15: qty 9

## 2018-04-15 MED ORDER — TECHNETIUM TC 99M TETROFOSMIN IV KIT
32.4000 | PACK | Freq: Once | INTRAVENOUS | Status: AC | PRN
  Administered 2018-04-15: 32.4 via INTRAVENOUS
  Filled 2018-04-15: qty 33

## 2018-04-15 NOTE — Progress Notes (Signed)
Please arrange an earlier appt with Dr. Radford Pax or her APP to discuss myoview result.

## 2018-04-28 ENCOUNTER — Encounter: Payer: Self-pay | Admitting: Physician Assistant

## 2018-04-28 DIAGNOSIS — I340 Nonrheumatic mitral (valve) insufficiency: Secondary | ICD-10-CM | POA: Insufficient documentation

## 2018-04-28 NOTE — Progress Notes (Signed)
Cardiology Office Note    Date:  04/29/2018  ID:  [name], DOB Jun 02, 1934, MRN [id number] PCP:  Tara Lor, MD  Cardiologist:  Tara Him, MD   Chief Complaint: f/u abnormal stress test  History of Present Illness:  [name] is a 84 y.o. [place] female (speaks [place]) with history of DM, HTN, HL, trigeminal neuralgia, Raynaud's disease, moderate MR by echo 03/2018, dilated CBD/abnormal LFTs who presents for post-hospital follow-up. She was recently admitted with atypical chest pain and elevated blood pressure. She ruled out for MI but LFTs were significantly elevated along with lipase. US showed dilatation of CBD. MR showed no evidence of choledocholithiasis, possible stricuture of distal CBD. ERCP was recommended, though this was difficult to cannulate in the past for similar presentation in 2016. Her LFTs normalized. GI recommended referral to tertiary center for ERCP but given improvement in chemistries and symptoms, the patient preferred to follow conservatively. LFTs normalized 04/01/18. Otherwise recent labs showed normal CBC, K 3.5, Cr 0.93, A1C 7.0, LDL 82, 10/2017 TSH wnl. During admission her echo 03/25/18 showed normal LV function, grade 1DD, moderate MR. Nuclear stress test 04/15/18 report: "Large size, moderate severity partially reversible anteroseptal and septal perfusion defect and a medium size, moderate severity partially reversible perfusion defect of the lateral wall. These findings are suggestive of ischemia, but artifact cannot be excluded. Clinical correlation is recommended and additional testing may be necessary." Results were reviewed with Dr. Radford Pax who recommended cardiac CT with FFR although patient wanted to discuss in followup before proceeding.  She presents back for followup overall feeling that she is doing "well." She does continue to notice episodic chest discomfort a few times a week, mostly presenting in the AM upon  wakening, relieved by going back to sit, relax and lay down. Sometimes she goes back to sleep and when she wakes up, she feels fine. It is not worse with exertion or inspiration. She states she works "hard" in the garden and notices generalized fatigue but no shortness of breath or chest pain with this. It is not worse after meals. It resolves spontaneously. She does periodically follow her BP at home and last check was 013 systolic. This AM it is running high. She reports compliance with all meds, usually takes around lunch and dinner (last did so yesterday without issue).   Past Medical History:  Diagnosis Date  . Diabetes mellitus, type 2 ([place])   . History of cervical cancer   . Hyperlipidemia   . Hypertension   . Moderate mitral regurgitation   . Raynaud's phenomenon   . TN (trigeminal neuralgia)     Past Surgical History:  Procedure Laterality Date  . BREAST BIOPSY    . BREAST ENHANCEMENT SURGERY    . CHOLECYSTECTOMY N/A 02/14/2014   Procedure: LAPAROSCOPIC CHOLECYSTECTOMY;  Surgeon: Ralene Ok, MD;  Location: [facility];  Service: General;  Laterality: N/A;  . ERCP N/A 03/08/2015   Procedure: ENDOSCOPIC RETROGRADE CHOLANGIOPANCREATOGRAPHY (ERCP);  Surgeon: Ladene Artist, MD;  Location: [facility];  Service: Endoscopy;  Laterality: N/A;  . MASS EXCISION Right 12/25/2016   Procedure: EXCISION RIGHT SCALP MASS SEBACEOUS CYST;  Surgeon: Coralie Keens, MD;  Location: [facility];  Service: General;  Laterality: Right;  . TOTAL ABDOMINAL HYSTERECTOMY  1963   for cervical cancer in situ    Current Medications: Current Meds  Medication Sig  . aspirin EC 81 MG tablet Take 81 mg by mouth at bedtime.  . Blood Glucose Monitoring Suppl (ONE TOUCH  ULTRA MINI) w/Device KIT USE TO CHECK BLOOD SUGAR DAILY AND AS NEEDED  . carboxymethylcellulose (REFRESH PLUS) 0.5 % SOLN Place 1 drop into both eyes 2 (two) times daily.  . Lancets (ONETOUCH ULTRASOFT) lancets Once daily or PRN, Dx E11.9  .  lisinopril (PRINIVIL,ZESTRIL) 20 MG tablet TAKE ONE TABLET BY MOUTH TWICE DAILY  . meclizine (ANTIVERT) 25 MG tablet TAKE 1 TABLET BY MOUTH THREE TIMES DAILY AS NEEDED FOR DIZZINESS  . metFORMIN (GLUCOPHAGE) 500 MG tablet TAKE 1 TABLET BY MOUTH TWICE DAILY WITH FOOD (Patient taking differently: TAKE 1 TABLET BY MOUTH DAILY WITH FOOD)  . Multiple Vitamins-Minerals (WOMENS 50+ MULTI VITAMIN/MIN PO) Take 1 tablet by mouth daily with lunch.   [name] omeprazole (PRILOSEC) 20 MG capsule Take 1 capsule (20 mg total) by mouth daily.  . ONE TOUCH ULTRA TEST test strip USE  STRIP TO CHECK GLUCOSE ONCE DAILY AS DIRECTED  . ONETOUCH DELICA LANCETS 86V MISC USE TO CHECK BLOOD SUGAR ONCE DAILY  . TURMERIC CURCUMIN PO Take 1 capsule by mouth daily with lunch.      Allergies:   Morphine and related   Social History   Socioeconomic History  . Marital status: Married    Spouse name: [name]  . Number of children: 1  . Years of education: Not on file  . Highest education level: Not on file  Occupational History  . 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 Financial resource strain: Not on file  . Food insecurity:    Worry: Not on file    Inability: Not on file  . Transportation needs:    Medical: Not on file    Non-medical: Not on file  Tobacco Use  . Smoking status: Current Every Day Smoker    Packs/day: 1.50    Years: 60.00    Pack years: 90.00    Types: Cigarettes  . Smokeless tobacco: Never Used  Substance and Sexual Activity  . Alcohol use: No  . Drug use: No  . Sexual activity: Not on file  Lifestyle  . Physical activity:    Days per week: Not on file    Minutes per session: Not on file  . Stress: Not on file  Relationships  . Social connections:    Talks on phone: Not on file    Gets together: Not on file    Attends religious service: Not on file    Active member of club or organization: Not on file    Attends meetings of clubs or organizations: Not on file     Relationship status: Not on file  Other Topics Concern  . Not on file  Social History Narrative   Married.  Lives in [place] with her husband.  No FH of heart disease.   1 adult son   + smoker   No EtOH     Family History:  The patient's family history is negative for CAD and Diabetes Mellitus II.  ROS:   Please see the history of present illness. Reports generally poor appetite. Otherwise, review of systems is positive for itching on her R arm All other systems are reviewed and otherwise negative.    PHYSICAL EXAM:   VS:  BP (!) 191/83   Pulse 76   Ht '4\' 9"'  (1.448 m)   Wt 98 lb 1.9 oz (44.5 kg)   SpO2 98%   BMI 21.23 kg/m   BMI: Body mass index is 21.23 kg/m. GEN: Well nourished, well  developed thin Asian F, in no acute distress HEENT: normocephalic, atraumatic Neck: no JVD, carotid bruits, or masses Cardiac: RRR; no murmurs, rubs, or gallops, no edema  Respiratory:  clear to auscultation bilaterally, normal work of breathing GI: soft, nontender, nondistended, + BS MS: no deformity or atrophy Skin: warm and dry, no rash, mild excoriations on R arm c/w contact dermatitis Neuro:  Alert and Oriented x 3, Strength and sensation are intact, follows commands Psych: euthymic mood, full affect  Wt Readings from Last 3 Encounters:  04/29/18 98 lb 1.9 oz (44.5 kg)  04/15/18 100 lb (45.4 kg)  04/07/18 98 lb 3.2 oz (44.5 kg)      Studies/Labs Reviewed:   EKG:  EKG was ordered today and personally reviewed by me and demonstrates NSR 75bpm LAFB nonspecific ST-T Changes. Subtle ST sagging V5-V6, upsloped ST in V2. Similar to prior.  Recent Labs: 11/24/2017: TSH 0.96 03/24/2018: BUN 11; Potassium 3.5; Sodium 140 03/25/2018: Creatinine, Ser 0.84; Hemoglobin 14.5; Platelets 187 04/01/2018: ALT 24   Lipid Panel    Component Value Date/Time   CHOL 173 03/24/2018 1930   TRIG 142 03/24/2018 1930   HDL 63 03/24/2018 1930   CHOLHDL 2.7 03/24/2018 1930   VLDL 28 03/24/2018 1930     LDLCALC 82 03/24/2018 1930   LDLDIRECT 83.0 12/21/2014 1040    Additional studies/ records that were reviewed today include: Summarized above    ASSESSMENT & PLAN:   Abnormal stress test/chest pain - discussed options for workup with patient including cardiac CT and cath given her abnormal stress test. She does not wish to proceed with any further workup at that time and wants to try medication therapy approach. Will add amlodipine 104m daily which will not only help address BP but also her Raynaud's and empiric anti-anginal effect as well. The patient was instructed to continue to monitor their blood pressure at home and if still running >>914systolic on amlodipine, to go ahead and increase to 2 tablets (172m daily and call usKoreaShe verbalized understanding. Tobacco cessation has been advised. Warning sx reviewed. She will call usKoreaf chest discomfort persists, worsens or changes. ER precautions reviewed. 1. Moderate mitral regurgitation - follow clinically. As above, the patient is not interested in any specific cardiac testing at this time. 2. Essential HTN - as above. 3. Hyperlipidemia - will defer further management to primary care.  Disposition: F/u with myself in 4-5 weeks. She does note some itching on her R arm with mild excoriations after working in the garden yesterday all day long. I've asked her to contact PCP to discuss if this continues through the day, as a precaution given her liver disease.   Medication Adjustments/Labs and Tests Ordered: Current medicines are reviewed at length with the patient today.  Concerns regarding medicines are outlined above. Medication changes, Labs and Tests ordered today are summarized above and listed in the Patient Instructions accessible in Encounters.   Signed, DaCharlie PitterPA-C  04/29/2018 9:59 AM    [facility] [address]  [id number]: [phone]: [phone]

## 2018-04-29 ENCOUNTER — Ambulatory Visit: Payer: PPO | Admitting: Physician Assistant

## 2018-04-29 ENCOUNTER — Encounter: Payer: Self-pay | Admitting: Physician Assistant

## 2018-04-29 VITALS — BP 184/82 | HR 76 | Ht <= 58 in | Wt 98.1 lb

## 2018-04-29 DIAGNOSIS — I10 Essential (primary) hypertension: Secondary | ICD-10-CM

## 2018-04-29 DIAGNOSIS — E785 Hyperlipidemia, unspecified: Secondary | ICD-10-CM

## 2018-04-29 DIAGNOSIS — I340 Nonrheumatic mitral (valve) insufficiency: Secondary | ICD-10-CM | POA: Diagnosis not present

## 2018-04-29 DIAGNOSIS — R079 Chest pain, unspecified: Secondary | ICD-10-CM

## 2018-04-29 DIAGNOSIS — R9439 Abnormal result of other cardiovascular function study: Secondary | ICD-10-CM

## 2018-04-29 MED ORDER — AMLODIPINE BESYLATE 5 MG PO TABS: 5.0000 mg | ORAL_TABLET | Freq: Every day | ORAL | 3 refills | Status: DC

## 2018-04-29 NOTE — Patient Instructions (Signed)
Medication Instructions: Your physician has recommended you make the following change in your medication:  START: Amlodipine 5 mg taking 1 tablet by mouth daily    Labwork: None   Procedures/Testing: None  Follow-Up: Your physician recommends that you schedule a follow-up appointment in: 4 weeks with Melina Copa PA-C or the first available appointment she has open   Any Additional Special Instructions Will Be Listed Below (If Applicable).  IF YOUR BLOOD PRESSURE REMAINS GREATER THAN 130 YOU MAY INCREASE YOUR AMLODIPINE TO 2 TABLETS DAILY.    If you need a refill on your cardiac medications before your next appointment, please call your pharmacy.

## 2018-04-29 NOTE — Progress Notes (Signed)
Has she had any further CP

## 2018-05-01 ENCOUNTER — Telehealth: Payer: Self-pay

## 2018-05-01 NOTE — Telephone Encounter (Signed)
Left message on pt's home phone to call back. I spoke with pt's son [name] (dpr on file). He states that pt is monitoring BP daily. He states pt has not complained of any chest pain within the last 2 days. He states he will follow up with pt and call back. He also request that office notes be faxed to pt's primary MD. Informed him that I will forward to Sharrell Ku, [place]. He stated understanding and thankful for the call.

## 2018-05-01 NOTE — Telephone Encounter (Signed)
-----   Message from Freada Bergeron, [place] sent at 04/29/2018  4:29 PM EDT -----   ----- Message ----- From: Sueanne Margarita, MD Sent: 04/29/2018  12:31 PM To: Freada Bergeron, CMA    ----- Message ----- From: Felipa Evener Sent: 04/29/2018  10:24 AM To: Sueanne Margarita, MD  FYI - pt does not wish to pursue any further cardiac testing at this time.

## 2018-05-01 NOTE — Telephone Encounter (Signed)
Pt's son called back and confirmed that pt is not having any additional chest pain. BP today is 134/80. He feels the symptoms are more related to her heartburn, pt is taking omeprazole 20 mg daily. Pt has no other symptoms. I informed him I will send to Dr. Radford Pax. He was thankful for the call

## 2018-05-18 ENCOUNTER — Ambulatory Visit (INDEPENDENT_AMBULATORY_CARE_PROVIDER_SITE_OTHER): Payer: PPO | Admitting: Internal Medicine

## 2018-05-18 ENCOUNTER — Encounter: Payer: Self-pay | Admitting: Internal Medicine

## 2018-05-18 VITALS — BP 160/70 | HR 81 | Temp 96.8°F | Wt 98.5 lb

## 2018-05-18 DIAGNOSIS — I10 Essential (primary) hypertension: Secondary | ICD-10-CM | POA: Diagnosis not present

## 2018-05-18 DIAGNOSIS — I340 Nonrheumatic mitral (valve) insufficiency: Secondary | ICD-10-CM

## 2018-05-18 DIAGNOSIS — K838 Other specified diseases of biliary tract: Secondary | ICD-10-CM

## 2018-05-18 LAB — CBC WITH DIFFERENTIAL/PLATELET
BASOS PCT: 0.7 % (ref 0.0–3.0)
Basophils Absolute: 0.1 10*3/uL (ref 0.0–0.1)
Eosinophils Absolute: 0.1 10*3/uL (ref 0.0–0.7)
Eosinophils Relative: 1.2 % (ref 0.0–5.0)
HEMATOCRIT: 36.6 % (ref 36.0–46.0)
Hemoglobin: 12.4 g/dL (ref 12.0–15.0)
LYMPHS PCT: 27.5 % (ref 12.0–46.0)
Lymphs Abs: 2.1 10*3/uL (ref 0.7–4.0)
MCHC: 34 g/dL (ref 30.0–36.0)
MCV: 96.1 fl (ref 78.0–100.0)
MONOS PCT: 5.8 % (ref 3.0–12.0)
Monocytes Absolute: 0.4 10*3/uL (ref 0.1–1.0)
NEUTROS ABS: 4.9 10*3/uL (ref 1.4–7.7)
Neutrophils Relative %: 64.8 % (ref 43.0–77.0)
PLATELETS: 292 10*3/uL (ref 150.0–400.0)
RBC: 3.81 Mil/uL — ABNORMAL LOW (ref 3.87–5.11)
RDW: 14.1 % (ref 11.5–15.5)
WBC: 7.7 10*3/uL (ref 4.0–10.5)

## 2018-05-18 LAB — COMPREHENSIVE METABOLIC PANEL
ALT: 11 U/L (ref 0–35)
AST: 16 U/L (ref 0–37)
Albumin: 4 g/dL (ref 3.5–5.2)
Alkaline Phosphatase: 79 U/L (ref 39–117)
BUN: 11 mg/dL (ref 6–23)
CALCIUM: 9.3 mg/dL (ref 8.4–10.5)
CHLORIDE: 109 meq/L (ref 96–112)
CO2: 26 meq/L (ref 19–32)
CREATININE: 0.88 mg/dL (ref 0.40–1.20)
GFR: 65.33 mL/min (ref >60.00)
Glucose, Bld: 81 mg/dL (ref 70–99)
Potassium: 3.6 mEq/L (ref 3.5–5.1)
Sodium: 145 mEq/L (ref 135–145)
Total Bilirubin: 0.4 mg/dL (ref 0.2–1.2)
Total Protein: 6.2 g/dL (ref 6.0–8.3)

## 2018-05-18 LAB — AMYLASE: Amylase: 68 U/L (ref 27–131)

## 2018-05-18 NOTE — Patient Instructions (Signed)
Return in 1 month for follow-up Report any worsening abdominal pain vomiting or any new symptoms

## 2018-05-18 NOTE — Progress Notes (Signed)
Subjective:    Patient ID: [name], female    DOB: 14-Oct-1934, 84 y.o.   MRN: [id number]  HPI  Lab Results  Component Value Date   HGBA1C 7.0 03/24/2018     Wt Readings from Last 3 Encounters:  05/18/18 98 lb 8 oz (44.7 kg)  04/29/18 98 lb 1.9 oz (44.5 kg)  04/15/18 100 lb (72.51 kg)   84 year old patient who is seen today with a chief complaint of some abdominal pain.  She states the present pain is in the lower mid abdominal area.  She states that this pain has been present intermittently since her hospital admission about 2 months ago.  She describes poor appetite but no nausea or change in her bowel habits.  No vomiting. She was admitted 2 months ago with chest pain in the setting of common bile duct dilatation and elevated liver function studies.  The patient responded well clinically with resolution of abnormal chemistries. In 2016 she had a very similar presentation, and an ERCP was attempted but unable to cannulate the common bile duct.  The patient was transferred to a tertiary center the common bile duct on EUS was only 8 mm and this was not pursued. The patient has also been followed by cardiology and has had an abnormal nuclear stress test.  She does have occasional squeezing left-sided chest discomfort but seems to be rare but at times associated with exertion.  She remains active with outdoor gardening  She has recently been placed on amlodipine for better blood pressure control as well as for possible antianginal effects  Past Medical History:  Diagnosis Date  . Diabetes mellitus, type 2 ([place])   . History of cervical cancer   . Hyperlipidemia   . Hypertension   . Moderate mitral regurgitation   . Raynaud's phenomenon   . TN (trigeminal neuralgia)      Social History   Socioeconomic History  . Marital status: Married    Spouse name: [name]  . Number of children: 1  . Years of education: Not on file  . Highest education level: Not on file    Occupational History  . 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 Financial resource strain: Not on file  . Food insecurity:    Worry: Not on file    Inability: Not on file  . Transportation needs:    Medical: Not on file    Non-medical: Not on file  Tobacco Use  . Smoking status: Current Every Day Smoker    Packs/day: 1.50    Years: 60.00    Pack years: 90.00    Types: Cigarettes  . Smokeless tobacco: Never Used  Substance and Sexual Activity  . Alcohol use: No  . Drug use: No  . Sexual activity: Not on file  Lifestyle  . Physical activity:    Days per week: Not on file    Minutes per session: Not on file  . Stress: Not on file  Relationships  . Social connections:    Talks on phone: Not on file    Gets together: Not on file    Attends religious service: Not on file    Active member of club or organization: Not on file    Attends meetings of clubs or organizations: Not on file    Relationship status: Not on file  . Intimate partner violence:    Fear of current or ex partner: Not on file    Emotionally abused:  Not on file    Physically abused: Not on file    Forced sexual activity: Not on file  Other Topics Concern  . Not on file  Social History Narrative   Married.  Lives in [place] with her husband.  No FH of heart disease.   1 adult son   + smoker   No EtOH    Past Surgical History:  Procedure Laterality Date  . BREAST BIOPSY    . BREAST ENHANCEMENT SURGERY    . CHOLECYSTECTOMY N/A 02/14/2014   Procedure: LAPAROSCOPIC CHOLECYSTECTOMY;  Surgeon: Ralene Ok, MD;  Location: [facility];  Service: General;  Laterality: N/A;  . ERCP N/A 03/08/2015   Procedure: ENDOSCOPIC RETROGRADE CHOLANGIOPANCREATOGRAPHY (ERCP);  Surgeon: Ladene Artist, MD;  Location: [facility];  Service: Endoscopy;  Laterality: N/A;  . MASS EXCISION Right 12/25/2016   Procedure: EXCISION RIGHT SCALP MASS SEBACEOUS CYST;  Surgeon: Coralie Keens, MD;  Location:  [facility];  Service: General;  Laterality: Right;  . TOTAL ABDOMINAL HYSTERECTOMY  1963   for cervical cancer in situ    Family History  Problem Relation Age of Onset  . CAD Neg Hx   . Diabetes Mellitus II Neg Hx     Allergies  Allergen Reactions  . Morphine And Related Itching    Short lived, mild itching.    Current Outpatient Medications on File Prior to Visit  Medication Sig Dispense Refill  . amLODipine (NORVASC) 5 MG tablet Take 1 tablet (5 mg total) by mouth daily. 90 tablet 3  . aspirin EC 81 MG tablet Take 81 mg by mouth at bedtime.    . Blood Glucose Monitoring Suppl (ONE TOUCH ULTRA MINI) w/Device KIT USE TO CHECK BLOOD SUGAR DAILY AND AS NEEDED 1 each 0  . carboxymethylcellulose (REFRESH PLUS) 0.5 % SOLN Place 1 drop into both eyes 2 (two) times daily.    . Lancets (ONETOUCH ULTRASOFT) lancets Once daily or PRN, Dx E11.9 100 each 12  . lisinopril (PRINIVIL,ZESTRIL) 20 MG tablet TAKE ONE TABLET BY MOUTH TWICE DAILY 180 tablet 1  . meclizine (ANTIVERT) 25 MG tablet TAKE 1 TABLET BY MOUTH THREE TIMES DAILY AS NEEDED FOR DIZZINESS 60 tablet 4  . metFORMIN (GLUCOPHAGE) 500 MG tablet TAKE 1 TABLET BY MOUTH TWICE DAILY WITH FOOD (Patient taking differently: TAKE 1 TABLET BY MOUTH DAILY WITH FOOD) 180 tablet 1  . Multiple Vitamins-Minerals (WOMENS 50+ MULTI VITAMIN/MIN PO) Take 1 tablet by mouth daily with lunch.     [name] omeprazole (PRILOSEC) 20 MG capsule Take 1 capsule (20 mg total) by mouth daily. 90 capsule 3  . ONE TOUCH ULTRA TEST test strip USE  STRIP TO CHECK GLUCOSE ONCE DAILY AS DIRECTED 100 each 1  . ONETOUCH DELICA LANCETS 59R MISC USE TO CHECK BLOOD SUGAR ONCE DAILY 100 each 4  . TURMERIC CURCUMIN PO Take 1 capsule by mouth daily with lunch.      No current facility-administered medications on file prior to visit.     BP (!) 160/70 (BP Location: Right Arm, Patient Position: Sitting, Cuff Size: Normal)   Pulse 81   Temp (!) 96.8 F (36 C) (Oral)   Wt 98 lb 8 oz (44.7  kg)   SpO2 96%   BMI 21.32 kg/m     Review of Systems  Constitutional: Positive for appetite change and unexpected weight change.  HENT: Negative for congestion, dental problem, hearing loss, rhinorrhea, sinus pressure, sore throat and tinnitus.   Eyes: Negative for pain, discharge  and visual disturbance.  Respiratory: Negative for cough and shortness of breath.   Cardiovascular: Negative for chest pain, palpitations and leg swelling.  Gastrointestinal: Negative for abdominal distention, abdominal pain, blood in stool, constipation, diarrhea, nausea and vomiting.  Genitourinary: Negative for difficulty urinating, dysuria, flank pain, frequency, hematuria, pelvic pain, urgency, vaginal bleeding, vaginal discharge and vaginal pain.  Musculoskeletal: Negative for arthralgias, gait problem and joint swelling.  Skin: Negative for rash.  Neurological: Negative for dizziness, syncope, speech difficulty, weakness, numbness and headaches.  Hematological: Negative for adenopathy.  Psychiatric/Behavioral: Negative for agitation, behavioral problems and dysphoric mood. The patient is not nervous/anxious.        Objective:   Physical Exam  Constitutional: She is oriented to person, place, and time. She appears well-developed and well-nourished. No distress.  HENT:  Head: Normocephalic.  Right Ear: External ear normal.  Left Ear: External ear normal.  Mouth/Throat: Oropharynx is clear and moist.  Eyes: Pupils are equal, round, and reactive to light. Conjunctivae and EOM are normal.  Neck: Normal range of motion. Neck supple. No thyromegaly present.  Cardiovascular: Normal rate, regular rhythm, normal heart sounds and intact distal pulses.  Pulmonary/Chest: Effort normal and breath sounds normal.  Abdominal: Soft. Bowel sounds are normal. She exhibits no mass. There is no tenderness.  Suggestion of very mild tenderness in the lower mid abdominal area Bowel sounds active No guarding No  epigastric or right upper quadrant tenderness  Musculoskeletal: Normal range of motion.  Lymphadenopathy:    She has no cervical adenopathy.  Neurological: She is alert and oriented to person, place, and time.  Skin: Skin is warm and dry. No rash noted.  Psychiatric: She has a normal mood and affect. Her behavior is normal.          Assessment & Plan:   Lower abdominal pain History of common bile duct dilatation with elevated LFTs.  Patient has been considered for a possible sphincterotomy in the past Abnormal nuclear stress test;  possible intermittent angina.  Follow-up cardiology next month as scheduled  Will check LFTs as well as amylase GI referral if unimproved  Marletta Lor

## 2018-06-01 ENCOUNTER — Encounter: Payer: Self-pay | Admitting: Physician Assistant

## 2018-06-15 NOTE — Progress Notes (Signed)
Cardiology Office Note    Date:  06/16/2018  ID:  [name], DOB 1934/05/28, MRN [id number] PCP:  Tara Lor, MD  Cardiologist:  Tara Him, MD   Chief Complaint: f/u chest pain  History of Present Illness:  [name] is a 84 y.o. [place] female (speaks moderate English) with history of DM, HTN, HL, trigeminal neuralgia, Raynaud's disease, moderate MR by echo 03/2018, dilated CBD/abnormal LFTs who presents for post-hospital follow-up. She was admitted in 03/2018 with atypical chest pain and elevated blood pressure. She ruled out for MI but LFTs were significantly elevated along with lipase. US showed dilatation of CBD. MR showed no evidence of choledocholithiasis, possible stricuture of distal CBD. ERCP was recommended, though this was difficult to cannulate in the past for similar presentation in 2016. Her LFTs normalized. GI recommended referral to tertiary center for ERCP but given improvement in chemistries and symptoms, the patient preferred to follow conservatively. LFTs normalized 04/01/18. Otherwise recent labs showed normal CBC, K 3.5, Cr 0.93, A1C 7.0, LDL 82, 10/2017 TSH wnl. During admission her echo 03/25/18 showed normal LV function, grade 1DD, moderate MR. Nuclear stress test 04/15/18 report: "Large size, moderate severity partially reversible anteroseptal and septal perfusion defect and a medium size, moderate severity partially reversible perfusion defect of the lateral wall. These findings are suggestive of ischemia, but artifact cannot be excluded. Clinical correlation is recommended and additional testing may be necessary." Results were reviewed with Dr. Radford Wright who recommended cardiac CT with FFR although patient wanted to discuss in followup before proceeding. At f/u with me on 04/29/18, she continued to notice episodic discomfort  but in general was feeling well and did not wish to pursue any further evaluation. Amlodipine was added for empiric  anti-anginal effect, rx of Raynaud's and BP.  We used interpreter with this visit. She returns for follow-up and denies any further chest pain. In general she feels well except has noticed some unsteadiness in her gait which she actually feels has been present for 2-3 months. She tells me she feels this is due to "getting older." She had one mechanical fall a while back but this was due to dog interference. She denies any chest pain, dyspnea, orthopnea, palpitations, syncope, headache, vision changes, dizziness, or lightheadedness. Blood pressure was difficult to obtain and markedly elevated so I called in Dr. Tamala Wright to assist who confirmed similar readings in the 220-230/90 range. He stated he was unsure if these were due to calcified vessels or acute true elevation. It is noted that BP was 223/82 when she presented for nuc 04/15/18, and was 184/82 when I saw her in June prior to addition of amlodipine. She now reports she's actually only taking one BP pill in the evening. We called her pharmacy and she did pick up the amlodipine but apparently has not picked up the lisinopril since March.  Past Medical History:  Diagnosis Date  . Diabetes mellitus, type 2 ([place])   . History of cervical cancer   . Hyperlipidemia   . Hypertension   . Moderate mitral regurgitation   . Raynaud's phenomenon   . TN (trigeminal neuralgia)     Past Surgical History:  Procedure Laterality Date  . BREAST BIOPSY    . BREAST ENHANCEMENT SURGERY    . CHOLECYSTECTOMY N/A 02/14/2014   Procedure: LAPAROSCOPIC CHOLECYSTECTOMY;  Surgeon: Tara Ok, MD;  Location: [facility];  Service: General;  Laterality: N/A;  . ERCP N/A 03/08/2015   Procedure: ENDOSCOPIC RETROGRADE CHOLANGIOPANCREATOGRAPHY (ERCP);  Surgeon: Norberto Sorenson  Tara Guadeloupe, MD;  Location: [facility];  Service: Endoscopy;  Laterality: N/A;  . MASS EXCISION Right 12/25/2016   Procedure: EXCISION RIGHT SCALP MASS SEBACEOUS CYST;  Surgeon: Tara Keens, MD;  Location: [place];   Service: General;  Laterality: Right;  . TOTAL ABDOMINAL HYSTERECTOMY  1963   for cervical cancer in situ    Current Medications: Current Meds  Medication Sig  . amLODipine (NORVASC) 5 MG tablet Take 1 tablet (5 mg total) by mouth daily.  [name] aspirin EC 81 MG tablet Take 81 mg by mouth at bedtime.  . Blood Glucose Monitoring Suppl (ONE TOUCH ULTRA MINI) w/Device KIT USE TO CHECK BLOOD SUGAR DAILY AND AS NEEDED  . carboxymethylcellulose (REFRESH PLUS) 0.5 % SOLN Place 1 drop into both eyes 2 (two) times daily.  . Lancets (ONETOUCH ULTRASOFT) lancets Once daily or PRN, Dx E11.9  . lisinopril (PRINIVIL,ZESTRIL) 20 MG tablet TAKE ONE TABLET BY MOUTH TWICE DAILY  . meclizine (ANTIVERT) 25 MG tablet TAKE 1 TABLET BY MOUTH THREE TIMES DAILY AS NEEDED FOR DIZZINESS  . metFORMIN (GLUCOPHAGE) 500 MG tablet Take 500 mg by mouth daily.  . Multiple Vitamins-Minerals (WOMENS 50+ MULTI VITAMIN/MIN PO) Take 1 tablet by mouth daily with lunch.   [name] omeprazole (PRILOSEC) 20 MG capsule Take 1 capsule (20 mg total) by mouth daily.  . ONE TOUCH ULTRA TEST test strip USE  STRIP TO CHECK GLUCOSE ONCE DAILY AS DIRECTED  . TURMERIC CURCUMIN PO Take 1 capsule by mouth daily with lunch.       Allergies:   Morphine and related   Social History   Socioeconomic History  . Marital status: Married    Spouse name: [name]  . Number of children: 1  . Years of education: Not on file  . Highest education level: Not on file  Occupational History  . 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 Financial resource strain: Not on file  . Food insecurity:    Worry: Not on file    Inability: Not on file  . Transportation needs:    Medical: Not on file    Non-medical: Not on file  Tobacco Use  . Smoking status: Current Every Day Smoker    Packs/day: 1.50    Years: 60.00    Pack years: 90.00    Types: Cigarettes  . Smokeless tobacco: Never Used  Substance and Sexual Activity  . Alcohol use: No    . Drug use: No  . Sexual activity: Not on file  Lifestyle  . Physical activity:    Days per week: Not on file    Minutes per session: Not on file  . Stress: Not on file  Relationships  . Social connections:    Talks on phone: Not on file    Gets together: Not on file    Attends religious service: Not on file    Active member of club or organization: Not on file    Attends meetings of clubs or organizations: Not on file    Relationship status: Not on file  Other Topics Concern  . Not on file  Social History Narrative   Married.  Lives in [place] with her husband.  No FH of heart disease.   1 adult son   + smoker   No EtOH     Family History:  The patient's family history is negative for CAD and Diabetes Mellitus II.  ROS:   Please see the history of present  illness.  All other systems are reviewed and otherwise negative.    PHYSICAL EXAM:   VS:  BP 112/70   Pulse 76   Ht 5' (1.524 m)   Wt 98 lb (44.5 kg)   SpO2 99%   BMI 19.14 kg/m   BMI: Body mass index is 19.14 kg/m. GEN: Well nourished, well developed pleasant, cheerful vietnamese F, in no acute distress HEENT: normocephalic, atraumatic Neck: no JVD, carotid bruits, or masses Cardiac: RRR; no murmurs, rubs, or gallops, no edema  Respiratory:  clear to auscultation bilaterally, normal work of breathing GI: soft, nontender, nondistended, + BS MS: no deformity or atrophy Skin: warm and dry, no rash Neuro:  Alert and Oriented x 3, Strength and sensation are intact, follows commands, normal facial symmetry, smiling, strength 5/5 equal bilaterally Psych: euthymic mood, full affect  Wt Readings from Last 3 Encounters:  06/16/18 98 lb (44.5 kg)  05/18/18 98 lb 8 oz (44.7 kg)  04/29/18 98 lb 1.9 oz (44.5 kg)      Studies/Labs Reviewed:   EKG:  EKG was not ordered today  Recent Labs: 11/24/2017: TSH 0.96 05/18/2018: ALT 11; BUN 11; Creatinine, Ser 0.88; Hemoglobin 12.4; Platelets 292.0; Potassium 3.6;  Sodium 145   Lipid Panel    Component Value Date/Time   CHOL 173 03/24/2018 1930   TRIG 142 03/24/2018 1930   HDL 63 03/24/2018 1930   CHOLHDL 2.7 03/24/2018 1930   VLDL 28 03/24/2018 1930   LDLCALC 82 03/24/2018 1930   LDLDIRECT 83.0 12/21/2014 1040    Additional studies/ records that were reviewed today include: Summarized above.  ASSESSMENT & PLAN:   1. Abnormal stress test/chest pain - clinically resolved. No further chest pain. Patient declined further testing as above, therefore, focus shifts to risk factor modification - BP control, tobacco cessation, regular follow-up Continue to follow for any new or evolving symptoms. 2. Essential HTN - markedly elevated today. She takes her BP med (presumably amlodipine based on pharmacy info) in the evening so likely wearing off around this time. Dr. Tamala Wright suggests she take her dose as soon as she gets home today, then move the dose to the AM. Will also check acute labs and restart lisinopril 63m daily. She will need recheck BP by the end of this week either in our office - if no availability, will need to be at primary care. She was advised she needs to bring her bottles to this visit. She has no acute signs of decompensation or acute organ involvement. I observed her gait and there is mild unsteadiness but no acute neurologic changes. She will be advised to f/u PCP for this as it does not seem to be an acute cardiac issue. 3. Moderate mitral regurgitation - follow clinically. As above, the patient is not interested in any specific cardiac testing at this time. 4. Hyperlipidemia - followed by primary care.  Disposition: F/u as outlined above.  Medication Adjustments/Labs and Tests Ordered: Current medicines are reviewed at length with the patient today.  Concerns regarding medicines are outlined above. Medication changes, Labs and Tests ordered today are summarized above and listed in the Patient Instructions accessible in Encounters.    Signed, DCharlie Pitter PA-C  06/16/2018 3:52 PM    [facility] [address] [place]  [id number]: [phone] Fax: [phone]

## 2018-06-16 ENCOUNTER — Encounter: Payer: Self-pay | Admitting: Physician Assistant

## 2018-06-16 ENCOUNTER — Ambulatory Visit (INDEPENDENT_AMBULATORY_CARE_PROVIDER_SITE_OTHER): Payer: PPO | Admitting: Physician Assistant

## 2018-06-16 VITALS — BP 112/70 | HR 76 | Ht 60.0 in | Wt 98.0 lb

## 2018-06-16 DIAGNOSIS — I340 Nonrheumatic mitral (valve) insufficiency: Secondary | ICD-10-CM

## 2018-06-16 DIAGNOSIS — R9439 Abnormal result of other cardiovascular function study: Secondary | ICD-10-CM | POA: Diagnosis not present

## 2018-06-16 DIAGNOSIS — I10 Essential (primary) hypertension: Secondary | ICD-10-CM | POA: Diagnosis not present

## 2018-06-16 DIAGNOSIS — E785 Hyperlipidemia, unspecified: Secondary | ICD-10-CM

## 2018-06-16 DIAGNOSIS — R079 Chest pain, unspecified: Secondary | ICD-10-CM

## 2018-06-16 MED ORDER — LISINOPRIL 20 MG PO TABS: 20.0000 mg | ORAL_TABLET | Freq: Every day | ORAL | 3 refills | Status: DC

## 2018-06-16 NOTE — Patient Instructions (Signed)
Medication Instructions:  Your physician has recommended you make the following change in your medication: 1.  START taking the Amlodipine to taking every day in the morning time 2.  START Lisinopril 20 mg taking 1 tablet daily (START AS SOON AS YOU PICK IT UP TODAY)   Labwork: TODAY:  BMET, CBC, & TSH  Testing/Procedures: None ordered  Follow-Up: Your physician recommends that you schedule a follow-up appointment by the end of this week, with APP or BP clinic for repeat bp check.   Any Other Special Instructions Will Be Listed Below (If Applicable).     If you need a refill on your cardiac medications before your next appointment, please call your pharmacy.

## 2018-06-17 ENCOUNTER — Telehealth: Payer: Self-pay | Admitting: *Deleted

## 2018-06-17 DIAGNOSIS — Z79899 Other long term (current) drug therapy: Secondary | ICD-10-CM

## 2018-06-17 LAB — BASIC METABOLIC PANEL
BUN / CREAT RATIO: 14 (ref 12–28)
BUN: 15 mg/dL (ref 8–27)
CALCIUM: 9.5 mg/dL (ref 8.7–10.3)
CO2: 23 mmol/L (ref 20–29)
Chloride: 108 mmol/L — ABNORMAL HIGH (ref 96–106)
Creatinine, Ser: 1.08 mg/dL — ABNORMAL HIGH (ref 0.57–1.00)
GFR, EST AFRICAN AMERICAN: 55 mL/min/{1.73_m2} — AB (ref >59)
GFR, EST NON AFRICAN AMERICAN: 48 mL/min/{1.73_m2} — AB (ref >59)
Glucose: 111 mg/dL — ABNORMAL HIGH (ref 65–99)
POTASSIUM: 5.1 mmol/L (ref 3.5–5.2)
Sodium: 145 mmol/L — ABNORMAL HIGH (ref 134–144)

## 2018-06-17 LAB — TSH: TSH: 0.882 u[IU]/mL (ref 0.450–4.500)

## 2018-06-17 LAB — CBC
Hematocrit: 42.3 % (ref 34.0–46.6)
Hemoglobin: 14.3 g/dL (ref 11.1–15.9)
MCH: 31.5 pg (ref 26.6–33.0)
MCHC: 33.8 g/dL (ref 31.5–35.7)
MCV: 93 fL (ref 79–97)
Platelets: 291 10*3/uL (ref 150–450)
RBC: 4.54 x10E6/uL (ref 3.77–5.28)
RDW: 12.3 % (ref 12.3–15.4)
WBC: 7.2 10*3/uL (ref 3.4–10.8)

## 2018-06-17 NOTE — Telephone Encounter (Signed)
-----   Message from Charlie Pitter, [place] sent at 06/17/2018  8:54 AM EDT ----- Please call patient. Labs show mild renal insufficiency and slightly elevated potassium as well as sodium. I wonder if this is in the setting of her extremely high BP. None of these abnormalities are severe, but as recommended in the OV she needs to get in to see [place] or PCP by the end of this week for recheck BP - and now recheck BMET as well. If no availability with [place], can you please call primary care to review? Thank you. Previous potassiums were normal but since we are starting lisinopril, this will need to be followed carefully. She also needs to bring ALL medication bottles to her visits. Dayna Dunn PA-C

## 2018-06-19 ENCOUNTER — Other Ambulatory Visit: Payer: PPO | Admitting: *Deleted

## 2018-06-19 ENCOUNTER — Ambulatory Visit (INDEPENDENT_AMBULATORY_CARE_PROVIDER_SITE_OTHER): Payer: PPO

## 2018-06-19 ENCOUNTER — Encounter (INDEPENDENT_AMBULATORY_CARE_PROVIDER_SITE_OTHER): Payer: Self-pay

## 2018-06-19 VITALS — BP 176/70 | HR 66 | Resp 12 | Wt 99.4 lb

## 2018-06-19 DIAGNOSIS — Z79899 Other long term (current) drug therapy: Secondary | ICD-10-CM

## 2018-06-19 DIAGNOSIS — I10 Essential (primary) hypertension: Secondary | ICD-10-CM | POA: Diagnosis not present

## 2018-06-19 LAB — BASIC METABOLIC PANEL
BUN/Creatinine Ratio: 13 (ref 12–28)
BUN: 12 mg/dL (ref 8–27)
CHLORIDE: 105 mmol/L (ref 96–106)
CO2: 25 mmol/L (ref 20–29)
Calcium: 9.7 mg/dL (ref 8.7–10.3)
Creatinine, Ser: 0.95 mg/dL (ref 0.57–1.00)
GFR calc Af Amer: 65 mL/min/{1.73_m2} (ref >59)
GFR calc non Af Amer: 56 mL/min/{1.73_m2} — ABNORMAL LOW (ref >59)
GLUCOSE: 133 mg/dL — AB (ref 65–99)
Potassium: 4.9 mmol/L (ref 3.5–5.2)
SODIUM: 143 mmol/L (ref 134–144)

## 2018-06-19 MED ORDER — HYDRALAZINE HCL 25 MG PO TABS: 25.0000 mg | ORAL_TABLET | Freq: Two times a day (BID) | ORAL | 3 refills | Status: DC

## 2018-06-19 NOTE — Progress Notes (Signed)
Agree with plan. Can we also arrange outpatient follow-up for her in our office as well in 3 months? I am confident her primary care can manage her BP as that clearly is the most pressing issue recently. Makell Cyr PA-C

## 2018-06-19 NOTE — Progress Notes (Signed)
  Pt came in for Bp check per [name] along with her Bmet. Pt states she has started Lisinopril 20mg  a day on Tuesday 7/23 and has had no symptoms of elevated BP. She denies headache, dizziness, SOB, chest pain. Her BP this morning is 176/70 and after recheck 5 minutes later in the other arm it was 180/72. Her HR was 66 and regular. I spoke with [name] after confirming with [name] that she did indeed pick up her RX and he advised to add Hydralazine 25mg  bid, continue Lisinopril 20mg  in the morning and continue Amlodopine. Pt verbalized understadning. I wrote everything down including her AVS. Pt is also seeing her PMD [name] on Monday 06/22/18. Pt to call or go to ER if develops any of the above symptoms. I also advised her to try and have her BP checked at home or the pharmacy over the weekend. Per [name] appointment made with her 08/03/18.

## 2018-06-19 NOTE — Patient Instructions (Addendum)
Medication Instructions:  Start hydralazine 25mg  twice a day. Decrease Lisinopril to 20mg  once a day.     Labwork: Had BMET today     Testing/Procedures: None ordered    Follow-Up: See Primary Care MD on Monday 06/22/18 Dr. Burnice Logan. See Melina Copa 08/03/18 at 1:30pm.      Any Other Special Instructions Will Be Listed Below (If Applicable).     If you need a refill on your cardiac medications before your next appointment, please call your pharmacy.

## 2018-06-22 ENCOUNTER — Ambulatory Visit (INDEPENDENT_AMBULATORY_CARE_PROVIDER_SITE_OTHER): Payer: PPO | Admitting: Internal Medicine

## 2018-06-22 ENCOUNTER — Encounter: Payer: Self-pay | Admitting: Internal Medicine

## 2018-06-22 VITALS — BP 188/80 | HR 64 | Temp 98.0°F | Wt 98.4 lb

## 2018-06-22 DIAGNOSIS — I7300 Raynaud's syndrome without gangrene: Secondary | ICD-10-CM

## 2018-06-22 DIAGNOSIS — I10 Essential (primary) hypertension: Secondary | ICD-10-CM | POA: Diagnosis not present

## 2018-06-22 MED ORDER — HYDROCHLOROTHIAZIDE 12.5 MG PO TABS: 12.5000 mg | ORAL_TABLET | Freq: Every day | ORAL | 3 refills | Status: DC

## 2018-06-22 NOTE — Progress Notes (Signed)
Subjective:    Patient ID: [name], female    DOB: [birth year], 84 y.o.   MRN: [id number]  HPI  84 year old patient who has a history of essential hypertension. She has been followed by both GI and cardiology She has a history of a positive stress test but her cardiac status has been stable and she has declined further evaluation.  Today she feels well She has significant systolic elevated blood pressure readings at cardiology. She seems unsure of her medications and unclear compliance.  Medical regimen includes amlodipine hydralazine and lisinopril She has type 2 diabetes controlled with metformin  She does have some arthritic pain with activities such as gardening she has had some benefit using her brothers diclofenac.  Due to possible thrombotic concerns, patient encouraged to give Tylenol a trial first  Past Medical History:  Diagnosis Date  . Diabetes mellitus, type 2 ([place])   . History of cervical cancer   . Hyperlipidemia   . Hypertension   . Moderate mitral regurgitation   . Raynaud's phenomenon   . TN (trigeminal neuralgia)      Social History   Socioeconomic History  . Marital status: Married    Spouse name: [name]  . Number of children: 1  . Years of education: Not on file  . Highest education level: Not on file  Occupational History  . 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 Financial resource strain: Not on file  . Food insecurity:    Worry: Not on file    Inability: Not on file  . Transportation needs:    Medical: Not on file    Non-medical: Not on file  Tobacco Use  . Smoking status: Current Every Day Smoker    Packs/day: 1.50    Years: 60.00    Pack years: 90.00    Types: Cigarettes  . Smokeless tobacco: Never Used  Substance and Sexual Activity  . Alcohol use: No  . Drug use: No  . Sexual activity: Not on file  Lifestyle  . Physical activity:    Days per week: Not on file    Minutes per session: Not on  file  . Stress: Not on file  Relationships  . Social connections:    Talks on phone: Not on file    Gets together: Not on file    Attends religious service: Not on file    Active member of club or organization: Not on file    Attends meetings of clubs or organizations: Not on file    Relationship status: Not on file  . Intimate partner violence:    Fear of current or ex partner: Not on file    Emotionally abused: Not on file    Physically abused: Not on file    Forced sexual activity: Not on file  Other Topics Concern  . Not on file  Social History Narrative   Married.  Lives in [place] with her husband.  No FH of heart disease.   1 adult son   + smoker   No EtOH    Past Surgical History:  Procedure Laterality Date  . BREAST BIOPSY    . BREAST ENHANCEMENT SURGERY    . CHOLECYSTECTOMY N/A 02/14/2014   Procedure: LAPAROSCOPIC CHOLECYSTECTOMY;  Surgeon: Ralene Ok, MD;  Location: [facility];  Service: General;  Laterality: N/A;  . ERCP N/A 03/08/2015   Procedure: ENDOSCOPIC RETROGRADE CHOLANGIOPANCREATOGRAPHY (ERCP);  Surgeon: Ladene Artist, MD;  Location: [facility];  Service:  Endoscopy;  Laterality: N/A;  . MASS EXCISION Right 12/25/2016   Procedure: EXCISION RIGHT SCALP MASS SEBACEOUS CYST;  Surgeon: Coralie Keens, MD;  Location: [place] [facility];  Service: General;  Laterality: Right;  . TOTAL ABDOMINAL HYSTERECTOMY  1963   for cervical cancer in situ    Family History  Problem Relation Age of Onset  . CAD Neg Hx   . Diabetes Mellitus II Neg Hx     Allergies  Allergen Reactions  . Morphine And Related Itching    Short lived, mild itching.    Current Outpatient Medications on File Prior to Visit  Medication Sig Dispense Refill  . amLODipine (NORVASC) 5 MG tablet Take 1 tablet (5 mg total) by mouth daily. 90 tablet 3  . aspirin EC 81 MG tablet Take 81 mg by mouth at bedtime.    . Blood Glucose Monitoring Suppl (ONE TOUCH ULTRA MINI) w/Device KIT USE TO CHECK BLOOD  SUGAR DAILY AND AS NEEDED 1 each 0  . carboxymethylcellulose (REFRESH PLUS) 0.5 % SOLN Place 1 drop into both eyes 2 (two) times daily.    . hydrALAZINE (APRESOLINE) 25 MG tablet Take 1 tablet (25 mg total) by mouth 2 (two) times daily after a meal. 180 tablet 3  . Lancets (ONETOUCH ULTRASOFT) lancets Once daily or PRN, Dx E11.9 100 each 12  . lisinopril (PRINIVIL,ZESTRIL) 20 MG tablet Take 1 tablet (20 mg total) by mouth daily. 90 tablet 3  . meclizine (ANTIVERT) 25 MG tablet TAKE 1 TABLET BY MOUTH THREE TIMES DAILY AS NEEDED FOR DIZZINESS 60 tablet 4  . metFORMIN (GLUCOPHAGE) 500 MG tablet Take 500 mg by mouth daily.    . Multiple Vitamins-Minerals (WOMENS 50+ MULTI VITAMIN/MIN PO) Take 1 tablet by mouth daily with lunch.     [name] omeprazole (PRILOSEC) 20 MG capsule Take 1 capsule (20 mg total) by mouth daily. 90 capsule 3  . ONE TOUCH ULTRA TEST test strip USE  STRIP TO CHECK GLUCOSE ONCE DAILY AS DIRECTED 100 each 1  . TURMERIC CURCUMIN PO Take 1 capsule by mouth daily with lunch.      No current facility-administered medications on file prior to visit.     BP (!) 188/80 (BP Location: Right Arm, Patient Position: Sitting, Cuff Size: Normal)   Pulse 64   Temp 98 F (36.7 C) (Oral)   Wt 98 lb 6.4 oz (44.6 kg)   BMI 19.22 kg/m     Review of Systems  Constitutional: Negative.   HENT: Negative for congestion, dental problem, hearing loss, rhinorrhea, sinus pressure, sore throat and tinnitus.   Eyes: Negative for pain, discharge and visual disturbance.  Respiratory: Negative for cough and shortness of breath.   Cardiovascular: Negative for chest pain, palpitations and leg swelling.  Gastrointestinal: Negative for abdominal distention, abdominal pain, blood in stool, constipation, diarrhea, nausea and vomiting.  Genitourinary: Negative for difficulty urinating, dysuria, flank pain, frequency, hematuria, pelvic pain, urgency, vaginal bleeding, vaginal discharge and vaginal pain.    Musculoskeletal: Negative for arthralgias, gait problem and joint swelling.  Skin: Negative for rash.  Neurological: Negative for dizziness, syncope, speech difficulty, weakness, numbness and headaches.  Hematological: Negative for adenopathy.  Psychiatric/Behavioral: Negative for agitation, behavioral problems and dysphoric mood. The patient is not nervous/anxious.        Objective:   Physical Exam  Constitutional: She is oriented to person, place, and time. She appears well-developed and well-nourished.  Blood pressure 176/80  HENT:  Head: Normocephalic.  Right Ear: External ear normal.  Left Ear: External ear normal.  Mouth/Throat: Oropharynx is clear and moist.  Eyes: Pupils are equal, round, and reactive to light. Conjunctivae and EOM are normal.  Neck: Normal range of motion. Neck supple. No thyromegaly present.  Cardiovascular: Normal rate, regular rhythm, normal heart sounds and intact distal pulses.  Pulmonary/Chest: Effort normal and breath sounds normal.  Breath sounds slightly diminished  Abdominal: Soft. Bowel sounds are normal. She exhibits no mass. There is no tenderness.  Musculoskeletal: Normal range of motion.  Lymphadenopathy:    She has no cervical adenopathy.  Neurological: She is alert and oriented to person, place, and time.  Skin: Skin is warm and dry. No rash noted.  Several fingertips cool and mildly cyanotic  Psychiatric: She has a normal mood and affect. Her behavior is normal.          Assessment & Plan:   Systolic hypertension.  We will add hydrochlorothiazide 12.5 mg to her regimen.  She has been asked to bring all her medications to her next visit We will check bemet at next office visit  Osteoarthritis.  Trial acetaminophen  Follow-up 2 to 3 weeks  Marletta Lor

## 2018-06-22 NOTE — Patient Instructions (Signed)
Limit your sodium (Salt) intake  Hydrochlorothiazide 25 mg daily  Please bring all medications to your follow-up office visit in 2 weeks  Take [phone] mg of Tylenol every 6 hours as needed for pain relief or fever.  Avoid taking more than 3000 mg in a 24-hour period (  This may cause liver damage).

## 2018-07-06 ENCOUNTER — Encounter: Payer: Self-pay | Admitting: Internal Medicine

## 2018-07-06 ENCOUNTER — Ambulatory Visit (INDEPENDENT_AMBULATORY_CARE_PROVIDER_SITE_OTHER): Payer: PPO | Admitting: Internal Medicine

## 2018-07-06 VITALS — BP 150/80 | HR 86 | Temp 98.0°F | Wt 97.2 lb

## 2018-07-06 DIAGNOSIS — E119 Type 2 diabetes mellitus without complications: Secondary | ICD-10-CM

## 2018-07-06 LAB — GLUCOSE, POCT (MANUAL RESULT ENTRY): POC GLUCOSE: 6.5 mg/dL — AB (ref 70–99)

## 2018-07-06 MED ORDER — NITROGLYCERIN 0.4 MG SL SUBL: 0.4000 mg | SUBLINGUAL_TABLET | SUBLINGUAL | 3 refills | Status: DC | PRN

## 2018-07-06 MED ORDER — METFORMIN HCL 500 MG PO TABS: 500.0000 mg | ORAL_TABLET | Freq: Two times a day (BID) | ORAL | 4 refills | Status: DC

## 2018-07-06 NOTE — Progress Notes (Signed)
Subjective:    Patient ID: [name], female    DOB: [birth year], 84 y.o.   MRN: [id number]  HPI  84 year old patient who is seen today for follow-up of essential hypertension and type 2 diabetes.  She is followed by cardiology with coronary artery disease. She has had a recent moderate risk nuclear stress test.  She continues to have occasional nonspecific chest pain usually in the late afternoon.  No clear exertional symptoms.  The pain she does describe is a heaviness. Today she brings all her medications and has been compliant.  Last visit low-dose hydrochlorothiazide was added to her regimen.  She feels well on these medications without tolerability issues.  She has had no chest pain in the past 2 weeks.  Apparently she has been taking amlodipine as needed chest pain She does have a history of chest pain that was felt related to biliary obstruction.  Past Medical History:  Diagnosis Date  . Diabetes mellitus, type 2 ([place])   . History of cervical cancer   . Hyperlipidemia   . Hypertension   . Moderate mitral regurgitation   . Raynaud's phenomenon   . TN (trigeminal neuralgia)      Social History   Socioeconomic History  . Marital status: Married    Spouse name: [name]  . Number of children: 1  . Years of education: Not on file  . Highest education level: Not on file  Occupational History  . 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 Financial resource strain: Not on file  . Food insecurity:    Worry: Not on file    Inability: Not on file  . Transportation needs:    Medical: Not on file    Non-medical: Not on file  Tobacco Use  . Smoking status: Current Every Day Smoker    Packs/day: 1.50    Years: 60.00    Pack years: 90.00    Types: Cigarettes  . Smokeless tobacco: Never Used  Substance and Sexual Activity  . Alcohol use: No  . Drug use: No  . Sexual activity: Not on file  Lifestyle  . Physical activity:    Days per week:  Not on file    Minutes per session: Not on file  . Stress: Not on file  Relationships  . Social connections:    Talks on phone: Not on file    Gets together: Not on file    Attends religious service: Not on file    Active member of club or organization: Not on file    Attends meetings of clubs or organizations: Not on file    Relationship status: Not on file  . Intimate partner violence:    Fear of current or ex partner: Not on file    Emotionally abused: Not on file    Physically abused: Not on file    Forced sexual activity: Not on file  Other Topics Concern  . Not on file  Social History Narrative   Married.  Lives in [place] with her husband.  No FH of heart disease.   1 adult son   + smoker   No EtOH    Past Surgical History:  Procedure Laterality Date  . BREAST BIOPSY    . BREAST ENHANCEMENT SURGERY    . CHOLECYSTECTOMY N/A 02/14/2014   Procedure: LAPAROSCOPIC CHOLECYSTECTOMY;  Surgeon: Ralene Ok, MD;  Location: [facility];  Service: General;  Laterality: N/A;  . ERCP N/A 03/08/2015   Procedure:  ENDOSCOPIC RETROGRADE CHOLANGIOPANCREATOGRAPHY (ERCP);  Surgeon: Ladene Artist, MD;  Location: [facility];  Service: Endoscopy;  Laterality: N/A;  . MASS EXCISION Right 12/25/2016   Procedure: EXCISION RIGHT SCALP MASS SEBACEOUS CYST;  Surgeon: Coralie Keens, MD;  Location: [facility];  Service: General;  Laterality: Right;  . TOTAL ABDOMINAL HYSTERECTOMY  1963   for cervical cancer in situ    Family History  Problem Relation Age of Onset  . CAD Neg Hx   . Diabetes Mellitus II Neg Hx     Allergies  Allergen Reactions  . Morphine And Related Itching    Short lived, mild itching.    Current Outpatient Medications on File Prior to Visit  Medication Sig Dispense Refill  . amLODipine (NORVASC) 5 MG tablet Take 1 tablet (5 mg total) by mouth daily. 90 tablet 3  . aspirin EC 81 MG tablet Take 81 mg by mouth at bedtime.    . Blood Glucose Monitoring Suppl (ONE TOUCH  ULTRA MINI) w/Device KIT USE TO CHECK BLOOD SUGAR DAILY AND AS NEEDED 1 each 0  . carboxymethylcellulose (REFRESH PLUS) 0.5 % SOLN Place 1 drop into both eyes 2 (two) times daily.    . hydrALAZINE (APRESOLINE) 25 MG tablet Take 1 tablet (25 mg total) by mouth 2 (two) times daily after a meal. 180 tablet 3  . hydrochlorothiazide (HYDRODIURIL) 12.5 MG tablet Take 1 tablet (12.5 mg total) by mouth daily. 90 tablet 3  . Lancets (ONETOUCH ULTRASOFT) lancets Once daily or PRN, Dx E11.9 100 each 12  . lisinopril (PRINIVIL,ZESTRIL) 20 MG tablet Take 1 tablet (20 mg total) by mouth daily. 90 tablet 3  . meclizine (ANTIVERT) 25 MG tablet TAKE 1 TABLET BY MOUTH THREE TIMES DAILY AS NEEDED FOR DIZZINESS 60 tablet 4  . Multiple Vitamins-Minerals (WOMENS 50+ MULTI VITAMIN/MIN PO) Take 1 tablet by mouth daily with lunch.     [name] omeprazole (PRILOSEC) 20 MG capsule Take 1 capsule (20 mg total) by mouth daily. 90 capsule 3  . ONE TOUCH ULTRA TEST test strip USE  STRIP TO CHECK GLUCOSE ONCE DAILY AS DIRECTED 100 each 1  . TURMERIC CURCUMIN PO Take 1 capsule by mouth daily with lunch.      No current facility-administered medications on file prior to visit.     BP (!) 150/80 (BP Location: Right Arm, Patient Position: Sitting, Cuff Size: Normal)   Pulse 86   Temp 98 F (36.7 C) (Oral)   Wt 97 lb 3.2 oz (44.1 kg)   SpO2 95%   BMI 18.98 kg/m     Review of Systems  Constitutional: Negative.   HENT: Negative for congestion, dental problem, hearing loss, rhinorrhea, sinus pressure, sore throat and tinnitus.   Eyes: Negative for pain, discharge and visual disturbance.  Respiratory: Negative for cough and shortness of breath.   Cardiovascular: Positive for chest pain. Negative for palpitations and leg swelling.  Gastrointestinal: Negative for abdominal distention, abdominal pain, blood in stool, constipation, diarrhea, nausea and vomiting.  Genitourinary: Negative for difficulty urinating, dysuria, flank pain,  frequency, hematuria, pelvic pain, urgency, vaginal bleeding, vaginal discharge and vaginal pain.  Musculoskeletal: Positive for gait problem. Negative for arthralgias and joint swelling.  Skin: Negative for rash.  Neurological: Negative for dizziness, syncope, speech difficulty, weakness, numbness and headaches.  Hematological: Negative for adenopathy.  Psychiatric/Behavioral: Negative for agitation, behavioral problems and dysphoric mood. The patient is not nervous/anxious.        Objective:   Physical Exam  Constitutional: She  is oriented to person, place, and time. She appears well-developed and well-nourished.   Blood pressure 160/64 no distress  HENT:  Head: Normocephalic.  Right Ear: External ear normal.  Left Ear: External ear normal.  Mouth/Throat: Oropharynx is clear and moist.  Eyes: Pupils are equal, round, and reactive to light. Conjunctivae and EOM are normal.  Neck: Normal range of motion. Neck supple. No thyromegaly present.  Cardiovascular: Normal rate, regular rhythm and normal heart sounds.  Pulmonary/Chest: Effort normal and breath sounds normal.  Abdominal: Soft. Bowel sounds are normal. She exhibits no mass. There is no tenderness.  Musculoskeletal: Normal range of motion. She exhibits no edema.  Lymphadenopathy:    She has no cervical adenopathy.  Neurological: She is alert and oriented to person, place, and time.  Skin: Skin is warm and dry. No rash noted.  Psychiatric: She has a normal mood and affect. Her behavior is normal.          Assessment & Plan:   Coronary artery disease.  History of episodic chest pain.  New prescription for nitroglycerin dispensed History of bili tract obstruction with epigastric and chest pain Hypertension systolic blood pressure still up a bit but much improved we will continue present regimen and reassess in 4 weeks Diabetes mellitus.  Blood sugars trending up.  Will increase metformin to a twice daily regimen.  Follow-up  4 weeks  Marletta Lor

## 2018-07-06 NOTE — Patient Instructions (Signed)
Limit your sodium (Salt) intake  Use nitroglycerin as directed if you develop any chest discomfort  Continue to take all blood pressure medicines as prescribed  Increase metformin to 1 tablet twice daily  Return in 4 weeks for follow-up

## 2018-07-10 ENCOUNTER — Other Ambulatory Visit: Payer: Self-pay | Admitting: Internal Medicine

## 2018-07-31 ENCOUNTER — Ambulatory Visit (HOSPITAL_COMMUNITY)
Admission: RE | Admit: 2018-07-31 | Discharge: 2018-07-31 | Disposition: A | Discharge status: Home / Self Care | Payer: PPO | Source: Ambulatory Visit | Attending: Cardiovascular Disease | Admitting: Cardiovascular Disease

## 2018-07-31 DIAGNOSIS — I779 Disorder of arteries and arterioles, unspecified: Secondary | ICD-10-CM | POA: Diagnosis not present

## 2018-07-31 DIAGNOSIS — I739 Peripheral vascular disease, unspecified: Secondary | ICD-10-CM

## 2018-08-02 ENCOUNTER — Encounter: Payer: Self-pay | Admitting: Physician Assistant

## 2018-08-02 NOTE — Progress Notes (Signed)
Cardiology Office Note    Date:  08/03/2018  ID:  [name], DOB [birth year], MRN [id number] PCP:  Marletta Lor, MD  Cardiologist:  [name], MD   Chief Complaint: f/u chest pain/HTN  History of Present Illness:  [name] is a 84 y.o. female with history of [place] female (speaks fairly good [place]). She has history of DM, HTN, HL, trigeminal neuralgia, Raynaud's disease, moderate MR by echo 03/2018, dilated CBD/abnormal LFTs and abnormal stress test. She presents for f/u of blood pressure, also to discuss CP.  I typically use the interpreter service with her. It should be noted however that even with interpreter services she can be a bit of a vague historian.  To recap, she was admitted in 03/2018 with atypical chest pain and elevated blood pressure. She ruled out for MI but LFTs were significantly elevated along with lipase. US showed dilatation of CBD. MR showed no evidence of choledocholithiasis & possible stricuture of distal CBD. ERCP was recommended, though this was difficult to cannulate in the past for similar presentation in 2016. Her LFTs normalized. GI recommended referral to tertiary center for ERCP but given normalization in chemistries and symptoms, the patient preferred to follow conservatively. During admission her echo 03/25/18 showed normal LV function, grade 1DD, moderate MR. Nuclear stress test 04/15/18 showed "Large size, moderate severity partially reversible anteroseptal and septal perfusion defect and a medium size, moderate severity partially reversible perfusion defect of the lateral wall. These findings are suggestive of ischemia, but artifact cannot be excluded. Clinical correlation is recommended and additional testing may be necessary." Results were reviewed with Dr. Radford Pax who recommended cardiac CT with FFR although patient wanted to discuss in followup before proceeding. At f/u with me on 04/29/18, she continued to notice  episodic discomfort  but in general was feeling well and she declined further evaluation. Amlodipine was added for empiric anti-anginal effect, rx of Raynaud's and BP. However, there appear to have been issues with medication compliance since that time. When she returned for follow-up 06/16/18, the initial BP per tech report was 112/70 but given some gait unsteadiness, I rechecked this. Blood pressure was difficult to obtain and markedly elevated so I called in Dr. Tamala Julian to assist who confirmed similar readings in the 220-230/90 range. He stated he was unsure if these were due to calcified vessels or acute true elevation. It is noted that BP was 223/82 when she presented for nuc 04/15/18, and was 184/82 when I saw her in June prior to addition of amlodipine. We had called her pharmacy and apparently she did pick up the amlodipine but had never picked up lisinopril. It was recommended to move her amlodipine to the morning (was taking QPM) and pick up lisinopril 72m ASAP to take daily. She came in for nurse visit 7/26 and BP was 176/70 so hydralazine 269mBID was added. She has since seen primary care who added HCTZ 12.15m15maily.  She returns for follow-up today alone. Similar to prior interactions, she remains a cheerful but vague historian. She says she's taking everything the doctor prescribes her, but then said she was only taking 1 BP medication. When shown the list she reported she was then taking all 4. We called her pharmacy and her lisinopril, HCTZ and hydralazine were all picked up in July except amlodipine which was previously filled 04/29/18 for #90. The amlodipine should have run out by now but the patient is adamant she thinks she's still taking it. She did not bring  her bottles with her today. She continues to notice episodic chest pain, 1-2x a day, occurring spontaneously without provocation, not worse with exertion, inspiration or palpation. She will usually go lay down and it will resolve. It happens  primary at rest, not with activity. She denies any other acute symptoms or problems.   Past Medical History:  Diagnosis Date  . Abnormal stress test    a. 03/2018 in setting of CP likely r/t biliary colic; pt declined further eval.  . At risk for medication noncompliance   . Diabetes mellitus, type 2 ([place])   . History of cervical cancer   . Hyperlipidemia   . Hypertension   . Moderate mitral regurgitation   . Raynaud's phenomenon   . TN (trigeminal neuralgia)     Past Surgical History:  Procedure Laterality Date  . BREAST BIOPSY    . BREAST ENHANCEMENT SURGERY    . CHOLECYSTECTOMY N/A 02/14/2014   Procedure: LAPAROSCOPIC CHOLECYSTECTOMY;  Surgeon: Ralene Ok, MD;  Location: [facility];  Service: General;  Laterality: N/A;  . ERCP N/A 03/08/2015   Procedure: ENDOSCOPIC RETROGRADE CHOLANGIOPANCREATOGRAPHY (ERCP);  Surgeon: Ladene Artist, MD;  Location: [facility];  Service: Endoscopy;  Laterality: N/A;  . MASS EXCISION Right 12/25/2016   Procedure: EXCISION RIGHT SCALP MASS SEBACEOUS CYST;  Surgeon: Coralie Keens, MD;  Location: [facility];  Service: General;  Laterality: Right;  . TOTAL ABDOMINAL HYSTERECTOMY  1963   for cervical cancer in situ    Current Medications: Current Meds  Medication Sig  . aspirin EC 81 MG tablet Take 81 mg by mouth at bedtime.  . Blood Glucose Monitoring Suppl (ONE TOUCH ULTRA MINI) w/Device KIT USE TO CHECK BLOOD SUGAR DAILY AND AS NEEDED  . carboxymethylcellulose (REFRESH PLUS) 0.5 % SOLN Place 1 drop into both eyes 2 (two) times daily.  . hydrALAZINE (APRESOLINE) 25 MG tablet Take 25 mg by mouth 2 (two) times daily as needed.  . hydrochlorothiazide (HYDRODIURIL) 12.5 MG tablet Take 1 tablet (12.5 mg total) by mouth daily.  . Lancets (ONETOUCH ULTRASOFT) lancets Once daily or PRN, Dx E11.9  . lisinopril (PRINIVIL,ZESTRIL) 20 MG tablet Take 1 tablet (20 mg total) by mouth daily.  . meclizine (ANTIVERT) 25 MG tablet TAKE 1 TABLET BY MOUTH THREE TIMES  DAILY AS NEEDED FOR DIZZINESS  . metFORMIN (GLUCOPHAGE) 500 MG tablet Take 1 tablet (500 mg total) by mouth 2 (two) times daily with a meal.  . Multiple Vitamins-Minerals (WOMENS 50+ MULTI VITAMIN/MIN PO) Take 1 tablet by mouth daily with lunch.   . nitroGLYCERIN (NITROSTAT) 0.4 MG SL tablet Place 1 tablet (0.4 mg total) under the tongue every 5 (five) minutes as needed for chest pain.  [name] omeprazole (PRILOSEC) 20 MG capsule Take 1 capsule (20 mg total) by mouth daily.  . ONE TOUCH ULTRA TEST test strip USE  STRIP TO CHECK GLUCOSE ONCE DAILY AS DIRECTED  . TURMERIC CURCUMIN PO Take 1 capsule by mouth daily with lunch.      Allergies:   Morphine and related   Social History   Socioeconomic History  . Marital status: Married    Spouse name: [name]  . Number of children: 1  . Years of education: Not on file  . Highest education level: Not on file  Occupational History  . 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 Financial resource strain: Not on file  . Food insecurity:    Worry: Not on file    Inability:  Not on file  . Transportation needs:    Medical: Not on file    Non-medical: Not on file  Tobacco Use  . Smoking status: Current Every Day Smoker    Packs/day: 1.50    Years: 60.00    Pack years: 90.00    Types: Cigarettes  . Smokeless tobacco: Never Used  Substance and Sexual Activity  . Alcohol use: No  . Drug use: No  . Sexual activity: Not on file  Lifestyle  . Physical activity:    Days per week: Not on file    Minutes per session: Not on file  . Stress: Not on file  Relationships  . Social connections:    Talks on phone: Not on file    Gets together: Not on file    Attends religious service: Not on file    Active member of club or organization: Not on file    Attends meetings of clubs or organizations: Not on file    Relationship status: Not on file  Other Topics Concern  . Not on file  Social History Narrative   Married.  Lives in  [place] with her husband.  No FH of heart disease.   1 adult son   + smoker   No EtOH     Family History:  The patient's family history is negative for CAD and Diabetes Mellitus II.  ROS:   Please see the history of present illness.   All other systems are reviewed and otherwise negative.    PHYSICAL EXAM:   VS:  BP (!) 160/76   Pulse 72   Ht 5' (1.524 m)   Wt 97 lb 6.4 oz (44.2 kg)   SpO2 99%   BMI 19.02 kg/m   BMI: Body mass index is 19.02 kg/m. GEN: Well nourished, well developed [place] F in no acute distress HEENT: normocephalic, atraumatic Neck: no JVD, carotid bruits, or masses Cardiac: RRR; no murmurs, rubs, or gallops, no edema, equal pulses throughout bilaterally Respiratory:  clear to auscultation bilaterally, normal work of breathing GI: soft, nontender, nondistended, + BS MS: no deformity or atrophy Skin: warm and dry, no rash Neuro:  Alert and Oriented x 3, Strength and sensation are intact, follows commands Psych: euthymic mood, full affect  Wt Readings from Last 3 Encounters:  08/03/18 97 lb 6.4 oz (44.2 kg)  07/06/18 97 lb 3.2 oz (44.1 kg)  06/22/18 98 lb 6.4 oz (44.6 kg)      Studies/Labs Reviewed:   EKG:  EKG was ordered today and personally reviewed by me and demonstrates NSR 71bpm, nonspecific ST-T changes. No change from recent tracings.   Recent Labs: 05/18/2018: ALT 11 06/16/2018: Hemoglobin 14.3; Platelets 291; TSH 0.882 06/19/2018: BUN 12; Creatinine, Ser 0.95; Potassium 4.9; Sodium 143   Lipid Panel    Component Value Date/Time   CHOL 173 03/24/2018 1930   TRIG 142 03/24/2018 1930   HDL 63 03/24/2018 1930   CHOLHDL 2.7 03/24/2018 1930   VLDL 28 03/24/2018 1930   LDLCALC 82 03/24/2018 1930   LDLDIRECT 83.0 12/21/2014 1040    Additional studies/ records that were reviewed today include: Summarized above  ASSESSMENT & PLAN:   1. Chest pain/abnormal stress test - mostly atypical features, but prior nuclear stress test was  borderline. Will proceed with cardiac CT as previously suggested to assess coronaries. She is now agreeable. Will get Lopressor 42m x 1 prior to procedure to achieve HR closer to 60s. Nurse went over instructions with interpreter. Will also  titrate amlodipine to 36m daily. If CT is unrevealing, will need to consider updating GI plan. 2. Essential HTN - management of this remains challenging as she doesn't seem to have a great grasp on what she is taking. It sounds like from primary care appointments they've been more successful at confirming this. She is adamant she's been taking amlodipine daily recently. She would now be due for a refill. We will increase to 148mdaily. I told her she definitely needs to bring all pill bottles to her PCP appointment on Wednesday 9/11. Check BMET given HCTZ initiation - also need to obtain in prep for cardiac CT. Will defer further close management of blood pressure to one source (primary care) to avoid further med confusion. Dr. KwBurnice Loganas taken good care of her for several years and therefore would likely have a better sense of whether she would benefit from any in-home assistance to ensure medication safety. I think her difficulties with medication management go beyond language barrier and extend moreso to general comprehension.  3. Mitral regurgitation - moderate by echo 03/2018. Anticipate continued surveillance for now. Treat BP as above. 4. Hyperlipidemia - LDL in 02/2018 was 82, followed by primary care.  Disposition: F/u with Dr. TuRadford Paxn 3 months, sooner if cardiac CT is abnormal.   Medication Adjustments/Labs and Tests Ordered: Current medicines are reviewed at length with the patient today.  Concerns regarding medicines are outlined above. Medication changes, Labs and Tests ordered today are summarized above and listed in the Patient Instructions accessible in Encounters.   Signed, DaCharlie PitterPA-C  08/03/2018 1:51 PM    [facility] [address] [place]  [id number]: [phone]: [phone]

## 2018-08-03 ENCOUNTER — Other Ambulatory Visit: Payer: Self-pay | Admitting: *Deleted

## 2018-08-03 ENCOUNTER — Ambulatory Visit (INDEPENDENT_AMBULATORY_CARE_PROVIDER_SITE_OTHER): Payer: PPO | Admitting: Physician Assistant

## 2018-08-03 ENCOUNTER — Encounter: Payer: Self-pay | Admitting: Physician Assistant

## 2018-08-03 VITALS — BP 160/76 | HR 72 | Ht 60.0 in | Wt 97.4 lb

## 2018-08-03 DIAGNOSIS — I340 Nonrheumatic mitral (valve) insufficiency: Secondary | ICD-10-CM

## 2018-08-03 DIAGNOSIS — R079 Chest pain, unspecified: Secondary | ICD-10-CM

## 2018-08-03 DIAGNOSIS — I10 Essential (primary) hypertension: Secondary | ICD-10-CM | POA: Diagnosis not present

## 2018-08-03 DIAGNOSIS — E785 Hyperlipidemia, unspecified: Secondary | ICD-10-CM | POA: Diagnosis not present

## 2018-08-03 DIAGNOSIS — R9439 Abnormal result of other cardiovascular function study: Secondary | ICD-10-CM | POA: Diagnosis not present

## 2018-08-03 MED ORDER — AMLODIPINE BESYLATE 10 MG PO TABS: 10.0000 mg | ORAL_TABLET | Freq: Every day | ORAL | 3 refills | Status: DC

## 2018-08-03 MED ORDER — AMLODIPINE BESYLATE 5 MG PO TABS: 5.0000 mg | ORAL_TABLET | Freq: Every day | ORAL | 3 refills | Status: DC

## 2018-08-03 MED ORDER — METOPROLOL TARTRATE 50 MG PO TABS: ORAL_TABLET | ORAL | 0 refills | Status: DC

## 2018-08-03 NOTE — Patient Instructions (Addendum)
Medication Instructions:  Your physician has recommended you make the following change in your medication:  1.  INCREASE the Amlodipine to 10 mg daily  Labwork: TODAY;  BMET  Testing/Procedures: Your physician has requested that you have cardiac CT. Cardiac computed tomography (CT) is a painless test that uses an x-ray machine to take clear, detailed pictures of your heart. For further information please visit [email or web address][facility]tn. Please follow instruction sheet as given.    Please arrive at the [facility] main entrance of [facility] at xx:xx AM (30-45 minutes prior to test start time)  [facility] [place], [place] [id number] [phone]  Proceed to the [facility] Radiology Department (First Floor).  Please follow these instructions carefully (unless otherwise directed):  Hold all erectile dysfunction medications at least 48 hours prior to test.  On the Night Before the Test: . Drink plenty of water. . Do not consume any caffeinated/decaffeinated beverages or chocolate 12 hours prior to your test. . Do not take any antihistamines 12 hours prior to your test. . If you take Metformin do not take 24 hours prior to test.  On the Day of the Test: . Drink plenty of water. Do not drink any water within one hour of the test. . Do not eat any food 4 hours prior to the test. . You may take your regular medications prior to the test. . IF NOT ON A BETA BLOCKER - Take 50 mg of lopressor (metoprolol) one hour before the test.  THIS HAS BEEN SENT TO YOUR PHARMACY  After the Test: . Drink plenty of water. . After receiving IV contrast, you may experience a mild flushed feeling. This is normal. . On occasion, you may experience a mild rash up to 24 hours after the test. This is not dangerous. If this occurs, you can take Benadryl 25 mg and increase your fluid intake. . If you experience trouble breathing, this can be serious. If it is severe call 911  IMMEDIATELY. If it is mild, please call our office. . If you take any of these medications: Glipizide/Metformin, Avandament, Glucavance, please do not take 48 hours after completing test.    Follow-Up:. Your physician recommends that you schedule a follow-up appointment in: 3 MONTHS WITH DR. Radford Pax     Any Other Special Instructions Will Be Listed Below (If Applicable).  CT Scan A CT scan (computed tomography scan) is an imaging scan. It uses X-rays and a computer to make detailed pictures of different areas inside the body. A CT scan can give more information than a regular X-ray exam. A CT scan provides data about internal organs, soft tissue structures, blood vessels, and bones. In this procedure, the pictures will be taken in a large machine that has an opening (CT scanner). Tell a health care provider about:  Any allergies you have.  All medicines you are taking, including vitamins, herbs, eye drops, creams, and over-the-counter medicines.  Any blood disorders you have.  Any surgeries you have had.  Any medical conditions you have.  Whether you are pregnant or may be pregnant. What are the risks? Generally, this is a safe procedure. However, problems may occur, including:  An allergic reaction to dyes.  Development of cancer from excessive exposure to radiation from multiple CT scans. This is rare.  What happens before the procedure? Staying hydrated Follow instructions from your health care provider about hydration, which may include:  Up to 2 hours before the procedure - you  may continue to drink clear liquids, such as water, clear fruit juice, black coffee, and plain tea.  Eating and drinking restrictions Follow instructions from your health care provider about eating and drinking, which may include:  24 hours before the procedure - stop drinking caffeinated beverages, such as energy drinks, tea, soda, coffee, and hot chocolate.  8 hours before the procedure -  stop eating heavy meals or foods such as meat, fried foods, or fatty foods.  6 hours before the procedure - stop eating light meals or foods, such as toast or cereal.  6 hours before the procedure - stop drinking milk or drinks that contain milk.  2 hours before the procedure - stop drinking clear liquids.  General instructions  Remove any jewelry.  Ask your health care provider about changing or stopping your regular medicines. This is especially important if you are taking diabetes medicines or blood thinners. What happens during the procedure?  You will lie on a table with your arms above your head.  An IV tube may be inserted into one of your veins.  The contrast dye may be injected into the IV tube. You may feel warm or have a metallic taste in your mouth.  The table you will be lying on will move into the CT scanner.  You will be able to see, hear, and talk to the person running the machine while you are in it. Follow that person's instructions.  The CT scanner will move around you to take pictures. Do not move while it is scanning. Staying still helps the scanner to get a good image.  When the best possible pictures have been taken, the machine will be turned off. The table will be moved out of the machine.  The IV tube will be removed. The procedure may vary among health care providers and hospitals. What happens after the procedure?  It is up to you to get the results of your procedure. Ask your health care provider, or the department that is doing the procedure, when your results will be ready. Summary  A CT scan is an imaging scan.  A CT scan uses X-rays and a computer to make detailed pictures of different areas of your body.  Follow instructions from your health care provider about eating and drinking before the procedure.  You will be able to see, hear, and talk to the person running the machine while you are in it. Follow that person's instructions. This  information is not intended to replace advice given to you by your health care provider. Make sure you discuss any questions you have with your health care provider. Document Released: 12/19/2004 Document Revised: 12/14/2016 Document Reviewed: 12/14/2016 Elsevier Interactive Patient Education  [employer].     If you need a refill on your cardiac medications before your next appointment, please call your pharmacy.

## 2018-08-04 LAB — BASIC METABOLIC PANEL
BUN / CREAT RATIO: 11 — AB (ref 12–28)
BUN: 11 mg/dL (ref 8–27)
CALCIUM: 9.9 mg/dL (ref 8.7–10.3)
CO2: 18 mmol/L — ABNORMAL LOW (ref 20–29)
CREATININE: 0.99 mg/dL (ref 0.57–1.00)
Chloride: 107 mmol/L — ABNORMAL HIGH (ref 96–106)
GFR calc non Af Amer: 53 mL/min/{1.73_m2} — ABNORMAL LOW (ref >59)
GFR, EST AFRICAN AMERICAN: 61 mL/min/{1.73_m2} (ref >59)
Glucose: 119 mg/dL — ABNORMAL HIGH (ref 65–99)
Potassium: 4.2 mmol/L (ref 3.5–5.2)
Sodium: 144 mmol/L (ref 134–144)

## 2018-08-05 ENCOUNTER — Ambulatory Visit (INDEPENDENT_AMBULATORY_CARE_PROVIDER_SITE_OTHER): Payer: PPO | Admitting: Internal Medicine

## 2018-08-05 ENCOUNTER — Encounter: Payer: Self-pay | Admitting: Internal Medicine

## 2018-08-05 VITALS — BP 140/80 | HR 67 | Temp 98.1°F | Wt 96.2 lb

## 2018-08-05 DIAGNOSIS — E119 Type 2 diabetes mellitus without complications: Secondary | ICD-10-CM

## 2018-08-05 DIAGNOSIS — I10 Essential (primary) hypertension: Secondary | ICD-10-CM | POA: Diagnosis not present

## 2018-08-05 LAB — POCT GLYCOSYLATED HEMOGLOBIN (HGB A1C): HEMOGLOBIN A1C: 6.5 % — AB (ref 4.0–5.6)

## 2018-08-05 MED ORDER — HYDRALAZINE HCL 25 MG PO TABS: 25.0000 mg | ORAL_TABLET | Freq: Two times a day (BID) | ORAL | 2 refills | Status: DC

## 2018-08-05 MED ORDER — GLUCOSE BLOOD VI STRP: ORAL_STRIP | 12 refills | Status: DC

## 2018-08-05 NOTE — Progress Notes (Signed)
Subjective:    Patient ID: [name], female    DOB: 1933-12-21, 84 y.o.   MRN: [id number]  HPI  Lab Results  Component Value Date   HGBA1C 7.0 03/24/2018   84 year old patient who is seen today in follow-up. She is followed closely by cardiology and is scheduled for a cardiac CT. This morning she had an episode of pain described as heaviness and associated with shortness of breath.  She took one nitroglycerin and pain followed resolved after approximately 30 minutes.  Pain occurred at rest and has not reoccurred  Her medications were reviewed.  Unclear whether she is taken 5 or 10 mg of amlodipine.  She does not have hydralazine  Repeat hemoglobin A1c today 6.5  Blood pressure improved at 140/60-70  Past Medical History:  Diagnosis Date  . Abnormal stress test    a. 03/2018 in setting of CP likely r/t biliary colic; pt declined further eval.  . At risk for medication noncompliance   . Diabetes mellitus, type 2 ([place])   . History of cervical cancer   . Hyperlipidemia   . Hypertension   . Moderate mitral regurgitation   . Raynaud's phenomenon   . TN (trigeminal neuralgia)      Social History   Socioeconomic History  . Marital status: Married    Spouse name: [name]  . Number of children: 1  . Years of education: Not on file  . Highest education level: Not on file  Occupational History  . 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 Financial resource strain: Not on file  . Food insecurity:    Worry: Not on file    Inability: Not on file  . Transportation needs:    Medical: Not on file    Non-medical: Not on file  Tobacco Use  . Smoking status: Current Every Day Smoker    Packs/day: 1.50    Years: 60.00    Pack years: 90.00    Types: Cigarettes  . Smokeless tobacco: Never Used  Substance and Sexual Activity  . Alcohol use: No  . Drug use: No  . Sexual activity: Not on file  Lifestyle  . Physical activity:    Days per  week: Not on file    Minutes per session: Not on file  . Stress: Not on file  Relationships  . Social connections:    Talks on phone: Not on file    Gets together: Not on file    Attends religious service: Not on file    Active member of club or organization: Not on file    Attends meetings of clubs or organizations: Not on file    Relationship status: Not on file  . Intimate partner violence:    Fear of current or ex partner: Not on file    Emotionally abused: Not on file    Physically abused: Not on file    Forced sexual activity: Not on file  Other Topics Concern  . Not on file  Social History Narrative   Married.  Lives in [place] with her husband.  No FH of heart disease.   1 adult son   + smoker   No EtOH    Past Surgical History:  Procedure Laterality Date  . BREAST BIOPSY    . BREAST ENHANCEMENT SURGERY    . CHOLECYSTECTOMY N/A 02/14/2014   Procedure: LAPAROSCOPIC CHOLECYSTECTOMY;  Surgeon: Ralene Ok, MD;  Location: [place];  Service: General;  Laterality: N/A;  .  ERCP N/A 03/08/2015   Procedure: ENDOSCOPIC RETROGRADE CHOLANGIOPANCREATOGRAPHY (ERCP);  Surgeon: Ladene Artist, MD;  Location: [facility];  Service: Endoscopy;  Laterality: N/A;  . MASS EXCISION Right 12/25/2016   Procedure: EXCISION RIGHT SCALP MASS SEBACEOUS CYST;  Surgeon: Coralie Keens, MD;  Location: [facility];  Service: General;  Laterality: Right;  . TOTAL ABDOMINAL HYSTERECTOMY  1963   for cervical cancer in situ    Family History  Problem Relation Age of Onset  . CAD Neg Hx   . Diabetes Mellitus II Neg Hx     Allergies  Allergen Reactions  . Morphine And Related Itching    Short lived, mild itching.    Current Outpatient Medications on File Prior to Visit  Medication Sig Dispense Refill  . amLODipine (NORVASC) 10 MG tablet Take 1 tablet (10 mg total) by mouth daily. 180 tablet 3  . aspirin EC 81 MG tablet Take 81 mg by mouth at bedtime.    . Blood Glucose Monitoring Suppl (ONE  TOUCH ULTRA MINI) w/Device KIT USE TO CHECK BLOOD SUGAR DAILY AND AS NEEDED 1 each 0  . carboxymethylcellulose (REFRESH PLUS) 0.5 % SOLN Place 1 drop into both eyes 2 (two) times daily.    . hydrochlorothiazide (HYDRODIURIL) 12.5 MG tablet Take 1 tablet (12.5 mg total) by mouth daily. 90 tablet 3  . Lancets (ONETOUCH ULTRASOFT) lancets Once daily or PRN, Dx E11.9 100 each 12  . lisinopril (PRINIVIL,ZESTRIL) 20 MG tablet Take 1 tablet (20 mg total) by mouth daily. 90 tablet 3  . meclizine (ANTIVERT) 25 MG tablet TAKE 1 TABLET BY MOUTH THREE TIMES DAILY AS NEEDED FOR DIZZINESS 60 tablet 4  . metFORMIN (GLUCOPHAGE) 500 MG tablet Take 1 tablet (500 mg total) by mouth 2 (two) times daily with a meal. 180 tablet 4  . metoprolol tartrate (LOPRESSOR) 50 MG tablet TAKE 1 TABLET 1 HOUR BEFORE YOUR CT 1 tablet 0  . Multiple Vitamins-Minerals (WOMENS 50+ MULTI VITAMIN/MIN PO) Take 1 tablet by mouth daily with lunch.     . nitroGLYCERIN (NITROSTAT) 0.4 MG SL tablet Place 1 tablet (0.4 mg total) under the tongue every 5 (five) minutes as needed for chest pain. 50 tablet 3  . omeprazole (PRILOSEC) 20 MG capsule Take 1 capsule (20 mg total) by mouth daily. 90 capsule 3  . TURMERIC CURCUMIN PO Take 1 capsule by mouth daily with lunch.      No current facility-administered medications on file prior to visit.     BP 140/80 (BP Location: Right Arm, Patient Position: Sitting, Cuff Size: Normal)   Pulse 67   Temp 98.1 F (36.7 C) (Oral)   Wt 96 lb 3.2 oz (43.6 kg)   SpO2 96%   BMI 18.79 kg/m     Review of Systems  Constitutional: Negative.   HENT: Negative for congestion, dental problem, hearing loss, rhinorrhea, sinus pressure, sore throat and tinnitus.   Eyes: Negative for pain, discharge and visual disturbance.  Respiratory: Positive for shortness of breath. Negative for cough.   Cardiovascular: Positive for chest pain. Negative for palpitations and leg swelling.  Gastrointestinal: Negative for  abdominal distention, abdominal pain, blood in stool, constipation, diarrhea, nausea and vomiting.  Genitourinary: Negative for difficulty urinating, dysuria, flank pain, frequency, hematuria, pelvic pain, urgency, vaginal bleeding, vaginal discharge and vaginal pain.  Musculoskeletal: Negative for arthralgias, gait problem and joint swelling.  Skin: Negative for rash.  Neurological: Negative for dizziness, syncope, speech difficulty, weakness, numbness and headaches.  Hematological: Negative for  adenopathy.  Psychiatric/Behavioral: Negative for agitation, behavioral problems and dysphoric mood. The patient is not nervous/anxious.        Objective:   Physical Exam  Constitutional: She is oriented to person, place, and time. She appears well-developed and well-nourished.  HENT:  Head: Normocephalic.  Right Ear: External ear normal.  Left Ear: External ear normal.  Mouth/Throat: Oropharynx is clear and moist.  Eyes: Pupils are equal, round, and reactive to light. Conjunctivae and EOM are normal.  Neck: Normal range of motion. Neck supple. No thyromegaly present.  Cardiovascular: Normal rate, regular rhythm and normal heart sounds.  Dorsalis pedis pulses palpable  Pulmonary/Chest: Effort normal and breath sounds normal.  Faint basilar rales  Abdominal: Soft. Bowel sounds are normal. She exhibits no mass. There is no tenderness.  Musculoskeletal: Normal range of motion.  Lymphadenopathy:    She has no cervical adenopathy.  Neurological: She is alert and oriented to person, place, and time.  Skin: Skin is warm and dry. No rash noted.  Psychiatric: She has a normal mood and affect. Her behavior is normal.          Assessment & Plan:   Chest pain syndrome.  Cardiac evaluation in progress.  Proper use of nitroglycerin discussed at length Hypertension.  Improved control compliance with medications discussed Diabetes mellitus improved  Cardiology follow-up as scheduled Follow-up  with new PCP in 3 months  Marletta Lor

## 2018-08-05 NOTE — Patient Instructions (Signed)
Cardiac evaluation and follow-up as scheduled  Limit your sodium (Salt) intake  Please check your blood pressure on a regular basis.  If it is consistently greater than 150/90, please make an office appointment.   Nitroglycerin use: If you develop any chest discomfort, place  nitroglycerin underneath your tongue and let this medication dissolve.  If pain is still present after 5 minutes, okay to repeat the dose.  You may repeat a third dose after another 5-minute interval if needed.  If pain persists, please contact the office or call EMS for transport to the emergency department.

## 2018-09-03 ENCOUNTER — Other Ambulatory Visit: Payer: Self-pay | Admitting: *Deleted

## 2018-09-03 DIAGNOSIS — Z79899 Other long term (current) drug therapy: Secondary | ICD-10-CM

## 2018-09-14 ENCOUNTER — Other Ambulatory Visit: Payer: PPO | Admitting: *Deleted

## 2018-09-14 DIAGNOSIS — Z79899 Other long term (current) drug therapy: Secondary | ICD-10-CM

## 2018-09-15 LAB — BASIC METABOLIC PANEL
BUN/Creatinine Ratio: 13 (ref 12–28)
BUN: 13 mg/dL (ref 8–27)
CALCIUM: 9.7 mg/dL (ref 8.7–10.3)
CHLORIDE: 108 mmol/L — AB (ref 96–106)
CO2: 18 mmol/L — AB (ref 20–29)
Creatinine, Ser: 0.99 mg/dL (ref 0.57–1.00)
GFR calc non Af Amer: 53 mL/min/{1.73_m2} — ABNORMAL LOW (ref >59)
GFR, EST AFRICAN AMERICAN: 61 mL/min/{1.73_m2} (ref >59)
GLUCOSE: 175 mg/dL — AB (ref 65–99)
POTASSIUM: 4.2 mmol/L (ref 3.5–5.2)
Sodium: 145 mmol/L — ABNORMAL HIGH (ref 134–144)

## 2018-09-21 ENCOUNTER — Ambulatory Visit (HOSPITAL_COMMUNITY)
Admission: RE | Admit: 2018-09-21 | Discharge: 2018-09-21 | Disposition: A | Discharge status: Home / Self Care | Payer: PPO | Source: Ambulatory Visit | Attending: Physician Assistant | Admitting: Physician Assistant

## 2018-09-21 DIAGNOSIS — I2510 Atherosclerotic heart disease of native coronary artery without angina pectoris: Secondary | ICD-10-CM | POA: Diagnosis not present

## 2018-09-21 DIAGNOSIS — R079 Chest pain, unspecified: Secondary | ICD-10-CM | POA: Diagnosis not present

## 2018-09-21 LAB — POCT I-STAT CREATININE: CREATININE: 1 mg/dL (ref 0.44–1.00)

## 2018-09-21 MED ORDER — METOPROLOL TARTRATE 5 MG/5ML IV SOLN
5.0000 mg | INTRAVENOUS | Status: DC | PRN
  Administered 2018-09-21 (×4): 5 mg via INTRAVENOUS
  Filled 2018-09-21 (×5): qty 5

## 2018-09-21 MED ORDER — METOPROLOL TARTRATE 5 MG/5ML IV SOLN
INTRAVENOUS | Status: AC
  Administered 2018-09-21: 5 mg via INTRAVENOUS
  Filled 2018-09-21: qty 5

## 2018-09-21 MED ORDER — METOPROLOL TARTRATE 5 MG/5ML IV SOLN
INTRAVENOUS | Status: AC
  Filled 2018-09-21: qty 10

## 2018-09-21 MED ORDER — IOPAMIDOL (ISOVUE-370) INJECTION 76%
80.0000 mL | Freq: Once | INTRAVENOUS | Status: AC | PRN
  Administered 2018-09-21: 80 mL via INTRAVENOUS

## 2018-09-21 MED ORDER — METOPROLOL TARTRATE 5 MG/5ML IV SOLN
INTRAVENOUS | Status: AC
  Administered 2018-09-21: 5 mg via INTRAVENOUS
  Filled 2018-09-21: qty 10

## 2018-09-21 MED ORDER — NITROGLYCERIN 0.4 MG SL SUBL
0.8000 mg | SUBLINGUAL_TABLET | Freq: Once | SUBLINGUAL | Status: AC
  Administered 2018-09-21: 0.8 mg via SUBLINGUAL
  Filled 2018-09-21: qty 25

## 2018-09-21 MED ORDER — NITROGLYCERIN 0.4 MG SL SUBL
SUBLINGUAL_TABLET | SUBLINGUAL | Status: AC
  Administered 2018-09-21: 0.8 mg via SUBLINGUAL
  Filled 2018-09-21: qty 2

## 2018-09-23 DIAGNOSIS — R079 Chest pain, unspecified: Secondary | ICD-10-CM | POA: Diagnosis not present

## 2018-09-24 ENCOUNTER — Telehealth: Payer: Self-pay | Admitting: *Deleted

## 2018-09-24 NOTE — Telephone Encounter (Signed)
-----   Message from Charlie Pitter, [place] sent at 09/23/2018  5:22 PM EDT ----- Please find out how patient is feeling and how chest pain is doing. Her CT scan showed moderate blockage so this will help [place] sort out whether medications need to be adjusted or further evaluation. As per prior notes she can be a tricky historian and inconsistent with meds. Thanks. Dayna Dunn PA-C

## 2018-09-24 NOTE — Telephone Encounter (Signed)
Called pt re: CT results. Left a message for pt to call back.

## 2018-09-25 NOTE — Telephone Encounter (Signed)
Follow up  ° ° °Patient is returning your call. °

## 2018-09-28 ENCOUNTER — Ambulatory Visit (INDEPENDENT_AMBULATORY_CARE_PROVIDER_SITE_OTHER): Payer: PPO | Admitting: Internal Medicine

## 2018-09-28 ENCOUNTER — Ambulatory Visit (INDEPENDENT_AMBULATORY_CARE_PROVIDER_SITE_OTHER): Payer: PPO

## 2018-09-28 ENCOUNTER — Encounter: Payer: Self-pay | Admitting: Internal Medicine

## 2018-09-28 VITALS — BP 138/80 | HR 80 | Temp 98.3°F | Wt 102.7 lb

## 2018-09-28 DIAGNOSIS — M25551 Pain in right hip: Secondary | ICD-10-CM

## 2018-09-28 DIAGNOSIS — M25512 Pain in left shoulder: Secondary | ICD-10-CM

## 2018-09-28 DIAGNOSIS — E119 Type 2 diabetes mellitus without complications: Secondary | ICD-10-CM | POA: Diagnosis not present

## 2018-09-28 DIAGNOSIS — M160 Bilateral primary osteoarthritis of hip: Secondary | ICD-10-CM | POA: Diagnosis not present

## 2018-09-28 DIAGNOSIS — M19012 Primary osteoarthritis, left shoulder: Secondary | ICD-10-CM | POA: Diagnosis not present

## 2018-09-28 MED ORDER — DICLOFENAC SODIUM 1 % TD GEL: 4.0000 g | Freq: Four times a day (QID) | TRANSDERMAL | 1 refills | Status: DC

## 2018-09-28 NOTE — Progress Notes (Signed)
Chief Complaint  Patient presents with  . Generalized Body Aches    Pain in shoulder and most joints. Worse in right hip which looks more swollen "puffy". No fever.     HPI: [name] 84 y.o. come in for [place]   just retired  Comes in with close friend who  Helps w  interpret .  Has had  Pain in left shoulder and right hip for months but over last week having inc pain and hard to sleep  Denies a fall Right hip and left shoulder    No fever   Pain med ? otc  .  No bowel or bladder  Changes  ? recently had ct angi  And asks about results and a1c .   ROS: See pertinent positives and negatives per HPI. No fall?  Fever cp sob new     Past Medical History:  Diagnosis Date  . Abnormal stress test    a. 03/2018 in setting of CP likely r/t biliary colic; pt declined further eval.  . At risk for medication noncompliance   . Diabetes mellitus, type 2 ([place])   . History of cervical cancer   . Hyperlipidemia   . Hypertension   . Moderate mitral regurgitation   . Raynaud's phenomenon   . TN (trigeminal neuralgia)     Family History  Problem Relation Age of Onset  . CAD Neg Hx   . Diabetes Mellitus II Neg Hx     Social History   Socioeconomic History  . Marital status: Married    Spouse name: [name]  . Number of children: 1  . Years of education: Not on file  . Highest education level: Not on file  Occupational History  . 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 Financial resource strain: Not on file  . Food insecurity:    Worry: Not on file    Inability: Not on file  . Transportation needs:    Medical: Not on file    Non-medical: Not on file  Tobacco Use  . Smoking status: Current Every Day Smoker    Packs/day: 1.50    Years: 60.00    Pack years: 90.00    Types: Cigarettes  . Smokeless tobacco: Never Used  Substance and Sexual Activity  . Alcohol use: No  . Drug use: No  . Sexual activity: Not on file  Lifestyle  .  Physical activity:    Days per week: Not on file    Minutes per session: Not on file  . Stress: Not on file  Relationships  . Social connections:    Talks on phone: Not on file    Gets together: Not on file    Attends religious service: Not on file    Active member of club or organization: Not on file    Attends meetings of clubs or organizations: Not on file    Relationship status: Not on file  Other Topics Concern  . Not on file  Social History Narrative   Married.  Lives in [place] with her husband.  No FH of heart disease.   1 adult son   + smoker   No EtOH    Outpatient Medications Prior to Visit  Medication Sig Dispense Refill  . amLODipine (NORVASC) 10 MG tablet Take 1 tablet (10 mg total) by mouth daily. 180 tablet 3  . aspirin EC 81 MG tablet Take 81 mg by mouth at bedtime.    [name]  Blood Glucose Monitoring Suppl (ONE TOUCH ULTRA MINI) w/Device KIT USE TO CHECK BLOOD SUGAR DAILY AND AS NEEDED 1 each 0  . carboxymethylcellulose (REFRESH PLUS) 0.5 % SOLN Place 1 drop into both eyes 2 (two) times daily.    [name] glucose blood (ACCU-CHEK GUIDE) test strip Use as instructed 100 each 12  . hydrALAZINE (APRESOLINE) 25 MG tablet Take 1 tablet (25 mg total) by mouth 2 (two) times daily. 180 tablet 2  . hydrochlorothiazide (HYDRODIURIL) 12.5 MG tablet Take 1 tablet (12.5 mg total) by mouth daily. 90 tablet 3  . Lancets (ONETOUCH ULTRASOFT) lancets Once daily or PRN, Dx E11.9 100 each 12  . meclizine (ANTIVERT) 25 MG tablet TAKE 1 TABLET BY MOUTH THREE TIMES DAILY AS NEEDED FOR DIZZINESS 60 tablet 4  . metFORMIN (GLUCOPHAGE) 500 MG tablet Take 1 tablet (500 mg total) by mouth 2 (two) times daily with a meal. 180 tablet 4  . metoprolol tartrate (LOPRESSOR) 50 MG tablet TAKE 1 TABLET 1 HOUR BEFORE YOUR CT 1 tablet 0  . Multiple Vitamins-Minerals (WOMENS 50+ MULTI VITAMIN/MIN PO) Take 1 tablet by mouth daily with lunch.     . nitroGLYCERIN (NITROSTAT) 0.4 MG SL tablet Place 1 tablet (0.4 mg  total) under the tongue every 5 (five) minutes as needed for chest pain. 50 tablet 3  . omeprazole (PRILOSEC) 20 MG capsule Take 1 capsule (20 mg total) by mouth daily. 90 capsule 3  . TURMERIC CURCUMIN PO Take 1 capsule by mouth daily with lunch.     . lisinopril (PRINIVIL,ZESTRIL) 20 MG tablet Take 1 tablet (20 mg total) by mouth daily. 90 tablet 3   No facility-administered medications prior to visit.      EXAM:  BP 138/80 (BP Location: Right Arm, Patient Position: Sitting, Cuff Size: Normal)   Pulse 80   Temp 98.3 F (36.8 C) (Oral)   Wt 102 lb 11.2 oz (46.6 kg)   BMI 20.06 kg/m   Body mass index is 20.06 kg/m.  GENERAL: vitals reviewed and listed above, alert, oriented, appears well hydrated and in no acute distress petite   In nad  But walking asymmetrical   ? Hip scoliotis?  HEENT: atraumatic, conjunctiva  clear, no obvious abnormalities on inspection of external nose and ears  NECK: no obvious masses on inspection palpation  LUNGS: clear to auscultation bilaterally, no wheezes, rales or rhonchi,  CV: HRRR, no clubbing cyanosis or  peripheral edema nl cap refill  abd soft no masses   When standing   righ thip higher then lfet and pain posterior hip weh moving laying downa d up  Left hsoulder parin on elevation and  Internal rotation  No acute findings   PSYCH: pleasant and cooperative, well groomed  Language barrier    Lab Results  Component Value Date   WBC 7.2 06/16/2018   HGB 14.3 06/16/2018   HCT 42.3 06/16/2018   PLT 291 06/16/2018   GLUCOSE 175 (H) 09/14/2018   CHOL 173 03/24/2018   TRIG 142 03/24/2018   HDL 63 03/24/2018   LDLDIRECT 83.0 12/21/2014   LDLCALC 82 03/24/2018   ALT 11 05/18/2018   AST 16 05/18/2018   NA 145 (H) 09/14/2018   K 4.2 09/14/2018   CL 108 (H) 09/14/2018   CREATININE 1.00 09/21/2018   BUN 13 09/14/2018   CO2 18 (L) 09/14/2018   TSH 0.882 06/16/2018   INR 0.90 03/24/2018   HGBA1C 6.5 (A) 08/05/2018   MICROALBUR 1.1 03/18/2016  BP Readings from Last 3 Encounters:  09/28/18 138/80  09/21/18 (!) 151/67  08/05/18 140/80    ASSESSMENT AND PLAN:  Discussed the following assessment and plan:  Left shoulder pain, unspecified chronicity - Plan: DG Shoulder Left, DG HIP UNILAT WITH PELVIS 2-3 VIEWS RIGHT  Right hip pain - Plan: DG Shoulder Left, DG HIP UNILAT WITH PELVIS 2-3 VIEWS RIGHT  Diabetes mellitus type 2 in nonobese [facility]) dpr call son about  Issues and results and plans   Suspect scoliosis changes but  Didn't order back x ray today  Bu hip and shoulder  .  Pain seems ms cause ?    reported results of ct angio no acute findings but cards should followup about plan  Of rx .     Expectant management. And about transition of care to new pcp  Total visit 67mns > 50% spent counseling and coordinating care as indicated in above note and in instructions to patient .   reveiwed data with friend and patient  And should fu with cards  -Patient advised to return or notify health care team  if  new concerns arise.  Patient Instructions  Pain acts like arthritis pain   X ray today   May end up seeing a specialists orthopedist  .   Can try topical  Voltaren   Up to 4 x per day   and oral acetaminophen 500 mg twice a day  As a safer medicine for pain .    Dr HJerilee Hohis [place] practice with [place]  at end of [place] and advise make fu appt with her   Or other  physician to establish         Or earlier if not  Worse in the interim  .  We can do a referral to orthopedics if needed.   Last a1c was 6.5  September .   WStandley Brooking  M.D.

## 2018-09-28 NOTE — Patient Instructions (Addendum)
Pain acts like arthritis pain   X ray today   May end up seeing a specialists orthopedist  .   Can try topical  Voltaren   Up to 4 x per day   and oral acetaminophen 500 mg twice a day  As a safer medicine for pain .    Dr Jerilee Hoh is beginning practice with [place]   at end of November and advise make fu appt with her   Or other  physician to establish         Or earlier if not  Worse in the interim  .  We can do a referral to orthopedics if needed.   Last a1c was 6.5  September .

## 2018-09-28 NOTE — Telephone Encounter (Signed)
Returned pt's call.  See result note.  

## 2018-09-29 ENCOUNTER — Other Ambulatory Visit: Payer: Self-pay

## 2018-09-29 NOTE — Patient Outreach (Signed)
[facility]) Care Management  09/29/2018  [name] Mar 29, 1934 [id number]   Telephone Screen  Referral Date: 09/29/18 Referral Source: Episource  Referral Reason: " DM, chronic pain to right hip, needs x-ray to right hip, possible arthritis, also needs Dexa scan, has neuropathy to BLE needs f/u monofilament testing" Insurance: HTA   Outreach attempt #1 to patient. No answer at present. RN CM left HIPAA compliant voicemail message along with contact info.       Plan: RN CM will make outreach attempt to patient within 3-4 business days. RN CM will send unsuccessful outreach letter to patient.   Enzo Montgomery, RN,BSN,CCM [place] [facility] Telephonic Care Management Coordinator Direct Phone: [phone] Toll Free: [phone] Fax: [phone]

## 2018-09-30 ENCOUNTER — Encounter: Payer: Self-pay | Admitting: Physician Assistant

## 2018-09-30 NOTE — Progress Notes (Signed)
Cardiology Office Note    Date:  10/01/2018  ID:  [name], DOB 1934-03-15, MRN [id number] PCP:  [name], Tara Halsted, MD (pending establishment)  Cardiologist:  Tara Him, MD   Chief Complaint: discuss CT results  History of Present Illness:  [name] is a 84 y.o. [place] female (speaks fairly good [place]). She has history of DM, HTN, HL, trigeminal neuralgia, Raynaud's disease, moderate MR by echo 03/2018, dilated CBD/abnormal LFTs, abnormal stress test, CAD by cardiac CT, and intermittent medication noncompliance with med reconciliation issues.  I typically use the interpreter service with her. It should be noted however that even with interpreter services she can a vague (but cheerful) historian.  To recap, she was admitted in 03/2018 with atypical chest pain and elevated blood pressure. She ruled out for MI but LFTs were significantly elevated along with lipase. US showed dilatation of CBD. MR showed no evidence of choledocholithiasis & possible stricuture of distal CBD. ERCP was recommended, though this was difficult to cannulate in the past for similar presentation in 2016. Her LFTs normalized. GI recommended referral to tertiary center for ERCP but given normalization in chemistries and symptoms, the patient elected to follow conservatively. During admission her echo 03/25/18 showed normal LV function, grade 1DD, moderate MR. Nuclear stress test 04/15/18 showed "Large size, moderate severity partially reversible anteroseptal and septal perfusion defect and a medium size, moderate severity partially reversible perfusion defect of the lateral wall. These findings are suggestive of ischemia, but artifact cannot be excluded. Clinical correlation is recommended and additional testing may be necessary."  I met her in 04/2018 to discuss Dr. Theodosia Wright recommendations to proceed with cardiac CT. She was noticing atypical episodic discomfort but in general was  feeling well and she declined further evaluation. Amlodipine was added for empiric anti-anginal effect and BP. However, there have been significant issues with medication compliance since that time. When she returned for follow-up 05/2018, she was reporting gait unsteadiness. Blood pressure was difficult to obtain and markedly elevated so I called in Dr. Tamala Julian to assist who confirmed similar readings in the 220-230/90 range. He stated he was unsure if these were due to calcified vessels or acute true elevation. (It is noted that BP was 223/82 when she presented for nuc 04/15/18.) We had called her pharmacy and apparently she did pick up the amlodipine but had never picked up lisinopril that had been prescribed in the past. She was encouraged to take both. She came in for nurse visit 05/2018 and BP was 176/70 so hydralazine 33m BID was added. Primary care eventually added HCTZ 12.58mdaily. When seen in the office 08/03/18, med rec again was a concern. She said she was taking everything the doctor prescribes her, but then said she was only taking 1 BP medication. When shown the list she reported she was then taking all 4. We called her pharmacy and her lisinopril, HCTZ and hydralazine were all picked up in July except amlodipine which was previously filled 04/29/18 for #90. The amlodipine should have run out by then. She was describing episodic atypical CP at rest but not with activity. She was agreeable to cardiac CT 09/21/18. This showed 82nd percentile for age/sex, moderate CAD in the prox and mid LAD and proximal and mid LCX arteries. CT FFR analysis showed stenosis in the mid portion of a non-dominant LCX artery with distal FFR 0.78. A medical management was recommended unless there are significant symptoms. She is brought in today to discuss results.  She brings in a friend today whom she requests help translate. BP is controlled. She tells me she never took the metoprolol prescribed to her in prep for her cardiac  CT scan, again citing no particular reason. She reports continued tobacco abuse of 60 years and has noticed a general lack of appetite and sleeping more often recently. Last month she had some atypical brief chest pains but since then has felt her "heart is normal" and denies any CP or SOB. She expresses a continued desire to avoid procedures for now. No LEE or syncope. Last labs 08/2018 Na 145, K 4.2, Cr 0.99, 05/2018 TSH wnl, CBC wnl, 02/2018 LDL 82.   Past Medical History:  Diagnosis Date  . At risk for medication noncompliance   . CAD (coronary artery disease)    a. borderline disease by CT 08/2018.  . Diabetes mellitus, type 2 ([place])   . History of cervical cancer   . Hyperlipidemia   . Hypertension   . Moderate mitral regurgitation   . Noncompliance with medication treatment due to underuse of medication   . Raynaud's phenomenon   . TN (trigeminal neuralgia)     Past Surgical History:  Procedure Laterality Date  . BREAST BIOPSY    . BREAST ENHANCEMENT SURGERY    . CHOLECYSTECTOMY N/A 02/14/2014   Procedure: LAPAROSCOPIC CHOLECYSTECTOMY;  Surgeon: Ralene Ok, MD;  Location: [facility];  Service: General;  Laterality: N/A;  . ERCP N/A 03/08/2015   Procedure: ENDOSCOPIC RETROGRADE CHOLANGIOPANCREATOGRAPHY (ERCP);  Surgeon: Ladene Artist, MD;  Location: [facility];  Service: Endoscopy;  Laterality: N/A;  . MASS EXCISION Right 12/25/2016   Procedure: EXCISION RIGHT SCALP MASS SEBACEOUS CYST;  Surgeon: Coralie Keens, MD;  Location: [place] [facility];  Service: General;  Laterality: Right;  . TOTAL ABDOMINAL HYSTERECTOMY  1963   for cervical cancer in situ    Current Medications: Current Meds  Medication Sig  . acetaminophen (TYLENOL) 500 MG tablet Take 500 mg by mouth 2 (two) times daily as needed for moderate pain.  [name] amLODipine (NORVASC) 10 MG tablet Take 1 tablet (10 mg total) by mouth daily.  [name] aspirin EC 81 MG tablet Take 81 mg by mouth at bedtime.  . Blood Glucose Monitoring Suppl  (ONE TOUCH ULTRA MINI) w/Device KIT USE TO CHECK BLOOD SUGAR DAILY AND AS NEEDED  . carboxymethylcellulose (REFRESH PLUS) 0.5 % SOLN Place 1 drop into both eyes 2 (two) times daily.  . diclofenac sodium (VOLTAREN) 1 % GEL Apply 4 g topically 4 (four) times daily. For joint pain .  . glucose blood (ACCU-CHEK GUIDE) test strip Use as instructed  . hydrALAZINE (APRESOLINE) 25 MG tablet Take 1 tablet (25 mg total) by mouth 2 (two) times daily.  . hydrochlorothiazide (HYDRODIURIL) 12.5 MG tablet Take 1 tablet (12.5 mg total) by mouth daily.  . Lancets (ONETOUCH ULTRASOFT) lancets Once daily or PRN, Dx E11.9  . meclizine (ANTIVERT) 25 MG tablet TAKE 1 TABLET BY MOUTH THREE TIMES DAILY AS NEEDED FOR DIZZINESS  . metFORMIN (GLUCOPHAGE) 500 MG tablet Take 1 tablet (500 mg total) by mouth 2 (two) times daily with a meal.  . metoprolol tartrate (LOPRESSOR) 50 MG tablet TAKE 1 TABLET 1 HOUR BEFORE YOUR CT  . Multiple Vitamins-Minerals (WOMENS 50+ MULTI VITAMIN/MIN PO) Take 1 tablet by mouth daily with lunch.   . nitroGLYCERIN (NITROSTAT) 0.4 MG SL tablet Place 1 tablet (0.4 mg total) under the tongue every 5 (five) minutes as needed for chest pain.  [name] omeprazole (  PRILOSEC) 20 MG capsule Take 1 capsule (20 mg total) by mouth daily.  . TURMERIC CURCUMIN PO Take 1 capsule by mouth daily with lunch.       Allergies:   Morphine and related   Social History   Socioeconomic History  . Marital status: Married    Spouse name: [name]  . Number of children: 1  . Years of education: Not on file  . Highest education level: Not on file  Occupational History  . 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 Financial resource strain: Not on file  . Food insecurity:    Worry: Not on file    Inability: Not on file  . Transportation needs:    Medical: Not on file    Non-medical: Not on file  Tobacco Use  . Smoking status: Current Every Day Smoker    Packs/day: 1.50    Years: 60.00    Pack  years: 90.00    Types: Cigarettes  . Smokeless tobacco: Never Used  Substance and Sexual Activity  . Alcohol use: No  . Drug use: No  . Sexual activity: Not on file  Lifestyle  . Physical activity:    Days per week: Not on file    Minutes per session: Not on file  . Stress: Not on file  Relationships  . Social connections:    Talks on phone: Not on file    Gets together: Not on file    Attends religious service: Not on file    Active member of club or organization: Not on file    Attends meetings of clubs or organizations: Not on file    Relationship status: Not on file  Other Topics Concern  . Not on file  Social History Narrative   Married.  Lives in [place] with her husband.  No FH of heart disease.   1 adult son   + smoker   No EtOH     Family History:  The patient's family history is negative for CAD and Diabetes Mellitus II.  ROS:   Please see the history of present illness.  All other systems are reviewed and otherwise negative.    PHYSICAL EXAM:   VS:  BP 130/70   Pulse 72   Ht 5' (1.524 m)   Wt 100 lb (45.4 kg)   SpO2 99%   BMI 19.53 kg/m   BMI: Body mass index is 19.53 kg/m. GEN: Well nourished, well developed Asian F in no acute distress HEENT: normocephalic, atraumatic Neck: no JVD, carotid bruits, or masses Cardiac:RRR; no murmurs, rubs, or gallops, no edema  Respiratory:  clear to auscultation bilaterally, normal work of breathing GI: soft, nontender, nondistended, + BS MS: no deformity or atrophy Skin: warm and dry, no rash Neuro:  Alert and Oriented x 3, Strength and sensation are intact, follows commands Psych: euthymic mood, full affect  Wt Readings from Last 3 Encounters:  10/01/18 100 lb (45.4 kg)  09/28/18 102 lb 11.2 oz (46.6 kg)  08/05/18 96 lb 3.2 oz (43.6 kg)      Studies/Labs Reviewed:   EKG:  EKG was not ordered today  Recent Labs: 05/18/2018: ALT 11 06/16/2018: Hemoglobin 14.3; Platelets 291; TSH 0.882 09/14/2018:  BUN 13; Potassium 4.2; Sodium 145 09/21/2018: Creatinine, Ser 1.00   Lipid Panel    Component Value Date/Time   CHOL 173 03/24/2018 1930   TRIG 142 03/24/2018 1930   HDL 63 03/24/2018 1930   CHOLHDL 2.7 03/24/2018  1930   VLDL 28 03/24/2018 1930   LDLCALC 82 03/24/2018 1930   LDLDIRECT 83.0 12/21/2014 1040    Additional studies/ records that were reviewed today include: Summarized above.   ASSESSMENT & PLAN:   1. CAD - I discussed results in depth with Dr. Radford Pax (primary cardiologist). The patient is not currently describing any symptoms consistent with ischemia. She is able to exert herself in regular everyday activity without any CP or dyspnea. She does report a generalized lack of appetite and sleeping more often which are nonspecific and we advised she discussed with her PCP. In the meantime we will focus on risk factor modification including continued BP control, continuing ASA, and adding statin therapy. Warning sx reviewed with patient and her friend. She will notify us promptly of any changes. She expresses a desire to avoid procedures when possible. However, if cath were required in the future, would need to take into account patient's intermittent unreliability with medications. 2. Essential HTN - controlled. 3. Hyperlipidemia - add atorvastatin 73m qpm, check LFTs/lipids in 6 weeks. 4. Moderate mitral regurgitation - moderate by recent echo 03/2018. Anticipate continued surveillance for now. Continue BP control. No sx of CHF or arrhythmia at present time. Most recent LVEF was normal.  Disposition: F/u with Dr. TRadford Paxin 6 months; sooner if symptoms arise.   Medication Adjustments/Labs and Tests Ordered: Current medicines are reviewed at length with the patient today.  Concerns regarding medicines are outlined above. Medication changes, Labs and Tests ordered today are summarized above and listed in the Patient Instructions accessible in Encounters.   Signed, DCharlie Pitter  PA-C  10/01/2018 10:49 AM    [place]  [id number]: [phone] Fax: [phone]

## 2018-10-01 ENCOUNTER — Ambulatory Visit: Payer: PPO | Admitting: Physician Assistant

## 2018-10-01 ENCOUNTER — Encounter: Payer: Self-pay | Admitting: Physician Assistant

## 2018-10-01 VITALS — BP 130/70 | HR 72 | Ht 60.0 in | Wt 100.0 lb

## 2018-10-01 DIAGNOSIS — E785 Hyperlipidemia, unspecified: Secondary | ICD-10-CM

## 2018-10-01 DIAGNOSIS — I340 Nonrheumatic mitral (valve) insufficiency: Secondary | ICD-10-CM | POA: Diagnosis not present

## 2018-10-01 DIAGNOSIS — I2510 Atherosclerotic heart disease of native coronary artery without angina pectoris: Secondary | ICD-10-CM

## 2018-10-01 DIAGNOSIS — I10 Essential (primary) hypertension: Secondary | ICD-10-CM | POA: Diagnosis not present

## 2018-10-01 DIAGNOSIS — R5383 Other fatigue: Secondary | ICD-10-CM | POA: Diagnosis not present

## 2018-10-01 LAB — BASIC METABOLIC PANEL
BUN/Creatinine Ratio: 21 (ref 12–28)
BUN: 17 mg/dL (ref 8–27)
CALCIUM: 9.6 mg/dL (ref 8.7–10.3)
CO2: 18 mmol/L — AB (ref 20–29)
CREATININE: 0.82 mg/dL (ref 0.57–1.00)
Chloride: 104 mmol/L (ref 96–106)
GFR calc Af Amer: 77 mL/min/{1.73_m2} (ref >59)
GFR calc non Af Amer: 67 mL/min/{1.73_m2} (ref >59)
Glucose: 146 mg/dL — ABNORMAL HIGH (ref 65–99)
Potassium: 4.5 mmol/L (ref 3.5–5.2)
SODIUM: 142 mmol/L (ref 134–144)

## 2018-10-01 LAB — TSH: TSH: 0.491 u[IU]/mL (ref 0.450–4.500)

## 2018-10-01 LAB — CBC
HEMOGLOBIN: 15.9 g/dL (ref 11.1–15.9)
Hematocrit: 46.2 % (ref 34.0–46.6)
MCH: 31.9 pg (ref 26.6–33.0)
MCHC: 34.4 g/dL (ref 31.5–35.7)
MCV: 93 fL (ref 79–97)
PLATELETS: 258 10*3/uL (ref 150–450)
RBC: 4.99 x10E6/uL (ref 3.77–5.28)
RDW: 14 % (ref 12.3–15.4)
WBC: 11.3 10*3/uL — AB (ref 3.4–10.8)

## 2018-10-01 MED ORDER — ATORVASTATIN CALCIUM 40 MG PO TABS: 40.0000 mg | ORAL_TABLET | Freq: Every day | ORAL | 3 refills | Status: DC

## 2018-10-01 NOTE — Patient Instructions (Addendum)
Medication Instructions:  Your physician has recommended you make the following change in your medication:  1.  START Atorvastatin 40 mg taking 1 tablet every evening  If you need a refill on your cardiac medications before your next appointment, please call your pharmacy.   Lab work: TODAY:  BMET, CBC, & TSH 6 WEEKS: FASTING LIPID & LFT  If you have labs (blood work) drawn today and your tests are completely normal, you will receive your results only by: [name] MyChart Message (if you have MyChart) OR . A paper copy in the mail If you have any lab test that is abnormal or we need to change your treatment, we will call you to review the results.  Testing/Procedures: None ordered  Follow-Up: At [facility], you and your health needs are our priority.  As part of our continuing mission to provide you with exceptional heart care, we have created designated Provider Care Teams.  These Care Teams include your primary Cardiologist (physician) and Advanced Practice Providers (APPs -  Physician Assistants and Nurse Practitioners) who all work together to provide you with the care you need, when you need it. You will need a follow up appointment in 6 months.  Please call our office 2 months in advance to schedule this appointment.  You may see [name], MD or one of the following Advanced Practice Providers on your designated Care Team:   Mabel, PA-C Melina Copa, PA-C . Ermalinda Barrios, PA-C  Any Other Special Instructions Will Be Listed Below (If Applicable).  Your cardiac CT scan indicated plaque buildup and narrowing of your heart arteries. However, it does not appear that this is impacting the general flow down your arteries for the majority of the scan. There is one area in your left circumflex artery down near the bottom that might be getting less blood flow. In the absence of major cardiac symptoms, Dr. Radford Pax recommends following for now, controlling blood pressure and  cholesterol, and stopping smoking. You should notify our office of any new chest pain or shortness of breath. You should also follow up with your primary care doctor for your lack of appetite and fatigue.

## 2018-10-02 ENCOUNTER — Telehealth: Payer: Self-pay | Admitting: *Deleted

## 2018-10-02 NOTE — Telephone Encounter (Signed)
Called pt re: lab results and left a message for her to call back.

## 2018-10-02 NOTE — Telephone Encounter (Signed)
-----   Message from Charlie Pitter, [place] sent at 10/02/2018  8:16 AM EST ----- Please let patient know labs are relatively stable except blood sugar remains a little high. WBC is mildly elevated but she didn't have any acute symptoms of infection so given her fatigue I would suggest she see PCP as we discussed for f/u.  Dayna Dunn PA-C

## 2018-10-05 ENCOUNTER — Other Ambulatory Visit: Payer: Self-pay

## 2018-10-05 NOTE — Telephone Encounter (Signed)
Returned call to pts son, [name], [place] on file. He was just wanting to discuss pts CT results and go over medication. All of his questions, if any, have been answered and he thanked me for the call.

## 2018-10-05 NOTE — Patient Outreach (Signed)
[name]) Care Management  10/05/2018  Warrene Kapfer [birth year] [id number]   Telephone Screen  Referral Date: 09/29/18 Referral Source: Episource     Referral Reason: " DM, chronic pain to right hip, needs x-ray to right hip, possible arthritis, also needs Dexa scan, has neuropathy to BLE needs f/u monofilament testing" Insurance: HTA   Outreach attempt #2 to patient. Spoke with patient and discussed referral. Patient states that she did make an appt and follow up with PCP as advised. She saw PCP on last week and had several tests and x-rays done. She voices she is waiting to hear back from MD regarding test results. She does voice that she had two X-rays done to determine source of pain to hip. MD has prescribed her some oral pain meds as well as topical cream to apply as needed which she is doing and voices pain relief. Patient states that she is independent with ADLs/IADLs. She drives herself to appts. She lives alone with her two dogs. She denies any issues with medications. Patient has PMH of DM(A1C 6.5), cervical CA, HLD, HTN, smoker and trigeminal neuralgia. She denies needing any further education and assistance in managing chronic conditions. [facility] services reviewed and discussed with patient. Patient appreciative of info but does not have any needs or concerns at this time that requires [facility] assistance. She was made aware that [place] has been mailed out to her and to feel free to call in the future for any needs or concerns.     Plan: RN CM will close case at this time.   Enzo Montgomery, RN,BSN,CCM [facility] Telephonic Care Management Coordinator Direct Phone: [phone] Toll Free: [phone] Fax: [phone]

## 2018-10-05 NOTE — Telephone Encounter (Signed)
Follow Up:     [name] returning your call from Friday, concerning pt's lab work. He says also needs to talk to somebody, concerning pt's blood pressure medicine.

## 2018-10-14 DIAGNOSIS — H00021 Hordeolum internum right upper eyelid: Secondary | ICD-10-CM | POA: Diagnosis not present

## 2018-10-21 ENCOUNTER — Telehealth: Payer: Self-pay | Admitting: Internal Medicine

## 2018-10-21 ENCOUNTER — Other Ambulatory Visit: Payer: Self-pay | Admitting: Internal Medicine

## 2018-10-21 ENCOUNTER — Ambulatory Visit (INDEPENDENT_AMBULATORY_CARE_PROVIDER_SITE_OTHER): Payer: PPO | Admitting: Internal Medicine

## 2018-10-21 ENCOUNTER — Encounter: Payer: Self-pay | Admitting: Internal Medicine

## 2018-10-21 VITALS — BP 110/70 | HR 64 | Temp 98.2°F | Wt 100.7 lb

## 2018-10-21 DIAGNOSIS — Z23 Encounter for immunization: Secondary | ICD-10-CM | POA: Diagnosis not present

## 2018-10-21 DIAGNOSIS — Z716 Tobacco abuse counseling: Secondary | ICD-10-CM | POA: Diagnosis not present

## 2018-10-21 DIAGNOSIS — E785 Hyperlipidemia, unspecified: Secondary | ICD-10-CM | POA: Diagnosis not present

## 2018-10-21 DIAGNOSIS — M25559 Pain in unspecified hip: Secondary | ICD-10-CM

## 2018-10-21 DIAGNOSIS — Z780 Asymptomatic menopausal state: Secondary | ICD-10-CM

## 2018-10-21 DIAGNOSIS — I10 Essential (primary) hypertension: Secondary | ICD-10-CM | POA: Diagnosis not present

## 2018-10-21 DIAGNOSIS — E119 Type 2 diabetes mellitus without complications: Secondary | ICD-10-CM | POA: Diagnosis not present

## 2018-10-21 DIAGNOSIS — Z299 Encounter for prophylactic measures, unspecified: Secondary | ICD-10-CM | POA: Diagnosis not present

## 2018-10-21 DIAGNOSIS — Z720 Tobacco use: Secondary | ICD-10-CM

## 2018-10-21 DIAGNOSIS — Z13820 Encounter for screening for osteoporosis: Secondary | ICD-10-CM | POA: Diagnosis not present

## 2018-10-21 DIAGNOSIS — M1990 Unspecified osteoarthritis, unspecified site: Secondary | ICD-10-CM

## 2018-10-21 DIAGNOSIS — F17209 Nicotine dependence, unspecified, with unspecified nicotine-induced disorders: Secondary | ICD-10-CM

## 2018-10-21 LAB — VITAMIN D 25 HYDROXY (VIT D DEFICIENCY, FRACTURES): VITD: 41.98 ng/mL (ref 30.00–100.00)

## 2018-10-21 LAB — TSH: TSH: 0.87 u[IU]/mL (ref 0.35–4.50)

## 2018-10-21 LAB — VITAMIN B12: VITAMIN B 12: 972 pg/mL — AB (ref 211–911)

## 2018-10-21 NOTE — Patient Instructions (Addendum)
-  Keep up the good work!  Your diabetes and blood pressure are very well controlled.  -Make sure you schedule your exam as soon as possible.  -We will schedule your DEXA scan.  -Flu shot today.

## 2018-10-21 NOTE — Telephone Encounter (Signed)
Patient is aware 

## 2018-10-21 NOTE — Telephone Encounter (Signed)
Patient stopped at front desk- asked if Dr Jerilee Hoh is going to call in something for her hip pain.  Please call patient with information.

## 2018-10-21 NOTE — Telephone Encounter (Signed)
May use local therapies: massage, icing. May also use OTC pain relievers like ibuprofen 200 mg 2 tabs twice a day for no more than 1 week.

## 2018-10-21 NOTE — Addendum Note (Signed)
Addended by: Westley Hummer B on: 10/21/2018 10:06 AM   Modules accepted: Orders

## 2018-10-21 NOTE — Progress Notes (Signed)
Established Patient Office Visit     CC/Reason for Visit: Establish care, follow-up chronic medical conditions  HPI: [name] is a 84 y.o. female who is coming in today for the above mentioned reasons.  She is past due for annual physical .Past Medical History is significant for: Coronary artery disease per coronary CT from October 2019, type 2 diabetes, hypertension, hyperlipidemia, prior history of elevated LFTs related to an episode of presumed biliary pancreatitis (her LFTs normalized that visit without ERCP).  She is followed closely by cardiology in regards to her coronary artery disease.  Patient is very hesitant about procedures and would like to avoid a heart catheterization if possible.  It is noted that she has a history of medication noncompliance, she is still not clear as to what medications she is taking but she brings in a list which seems consistent with what was advised she take at last follow-up with cardiology.  In addition to that her blood pressure is very well controlled today which would indicate compliance.  Her diabetes has also been very well controlled.  She is not due for repeat A1c today.  She was seen earlier this month at our clinic for hip and shoulder pain.  X-rays were obtained that showed some mild degenerative changes of the hip but were otherwise negative.  She continues to complain of some mild intermittent hip pain but this does not interfere with ambulation.  She is due for influenza vaccine, she is also due for eye exam which she states she will schedule.  We will also schedule DEXA scan.   Past Medical/Surgical History: Past Medical History:  Diagnosis Date  . At risk for medication noncompliance   . CAD (coronary artery disease)    a. borderline disease by CT 08/2018.  . Diabetes mellitus, type 2 ([place])   . History of cervical cancer   . Hyperlipidemia   . Hypertension   . Moderate mitral regurgitation   . Noncompliance with  medication treatment due to underuse of medication   . Raynaud's phenomenon   . TN (trigeminal neuralgia)     Past Surgical History:  Procedure Laterality Date  . BREAST BIOPSY    . BREAST ENHANCEMENT SURGERY    . CHOLECYSTECTOMY N/A 02/14/2014   Procedure: LAPAROSCOPIC CHOLECYSTECTOMY;  Surgeon: Ralene Ok, MD;  Location: [facility];  Service: General;  Laterality: N/A;  . ERCP N/A 03/08/2015   Procedure: ENDOSCOPIC RETROGRADE CHOLANGIOPANCREATOGRAPHY (ERCP);  Surgeon: Ladene Artist, MD;  Location: [facility];  Service: Endoscopy;  Laterality: N/A;  . MASS EXCISION Right 12/25/2016   Procedure: EXCISION RIGHT SCALP MASS SEBACEOUS CYST;  Surgeon: Coralie Keens, MD;  Location: [facility];  Service: General;  Laterality: Right;  . TOTAL ABDOMINAL HYSTERECTOMY  1963   for cervical cancer in situ    Social History:  reports that she has been smoking cigarettes. She has a 90.00 pack-year smoking history. She has never used smokeless tobacco. She reports that she does not drink alcohol or use drugs.  Allergies: Allergies  Allergen Reactions  . Morphine And Related Itching    Short lived, mild itching.    Family History:  Family History  Problem Relation Age of Onset  . CAD Neg Hx   . Diabetes Mellitus II Neg Hx      Current Outpatient Medications:  .  acetaminophen (TYLENOL) 500 MG tablet, Take 500 mg by mouth 2 (two) times daily as needed for moderate pain., Disp: , Rfl:  .  amLODipine (NORVASC) 10 MG tablet, Take 1 tablet (10 mg total) by mouth daily., Disp: 180 tablet, Rfl: 3 .  aspirin EC 81 MG tablet, Take 81 mg by mouth at bedtime., Disp: , Rfl:  .  atorvastatin (LIPITOR) 40 MG tablet, Take 1 tablet (40 mg total) by mouth daily., Disp: 90 tablet, Rfl: 3 .  Blood Glucose Monitoring Suppl (ONE TOUCH ULTRA MINI) w/Device KIT, USE TO CHECK BLOOD SUGAR DAILY AND AS NEEDED, Disp: 1 each, Rfl: 0 .  carboxymethylcellulose (REFRESH PLUS) 0.5 % SOLN, Place 1 drop into both eyes 2  (two) times daily., Disp: , Rfl:  .  diclofenac sodium (VOLTAREN) 1 % GEL, Apply 4 g topically 4 (four) times daily. For joint pain ., Disp: 4 Tube, Rfl: 1 .  glucose blood (ACCU-CHEK GUIDE) test strip, Use as instructed, Disp: 100 each, Rfl: 12 .  hydrALAZINE (APRESOLINE) 25 MG tablet, Take 1 tablet (25 mg total) by mouth 2 (two) times daily., Disp: 180 tablet, Rfl: 2 .  hydrochlorothiazide (HYDRODIURIL) 12.5 MG tablet, Take 1 tablet (12.5 mg total) by mouth daily., Disp: 90 tablet, Rfl: 3 .  Lancets (ONETOUCH ULTRASOFT) lancets, Once daily or PRN, Dx E11.9, Disp: 100 each, Rfl: 12 .  meclizine (ANTIVERT) 25 MG tablet, TAKE 1 TABLET BY MOUTH THREE TIMES DAILY AS NEEDED FOR DIZZINESS, Disp: 60 tablet, Rfl: 4 .  metFORMIN (GLUCOPHAGE) 500 MG tablet, Take 1 tablet (500 mg total) by mouth 2 (two) times daily with a meal., Disp: 180 tablet, Rfl: 4 .  Multiple Vitamins-Minerals (WOMENS 50+ MULTI VITAMIN/MIN PO), Take 1 tablet by mouth daily with lunch. , Disp: , Rfl:  .  nitroGLYCERIN (NITROSTAT) 0.4 MG SL tablet, Place 1 tablet (0.4 mg total) under the tongue every 5 (five) minutes as needed for chest pain., Disp: 50 tablet, Rfl: 3 .  omeprazole (PRILOSEC) 20 MG capsule, Take 1 capsule (20 mg total) by mouth daily., Disp: 90 capsule, Rfl: 3 .  TURMERIC CURCUMIN PO, Take 1 capsule by mouth daily with lunch. , Disp: , Rfl:  .  lisinopril (PRINIVIL,ZESTRIL) 20 MG tablet, Take 1 tablet (20 mg total) by mouth daily., Disp: 90 tablet, Rfl: 3  Review of Systems:  Constitutional: Denies fever, chills, diaphoresis, appetite change and fatigue.  HEENT: Denies photophobia, eye pain, redness, hearing loss, ear pain, congestion, sore throat, rhinorrhea, sneezing, mouth sores, trouble swallowing, neck pain, neck stiffness and tinnitus.   Respiratory: Denies SOB, DOE, cough, chest tightness,  and wheezing.   Cardiovascular: Denies chest pain, palpitations and leg swelling.  Gastrointestinal: Denies nausea, vomiting,  abdominal pain, diarrhea, constipation, blood in stool and abdominal distention.  Genitourinary: Denies dysuria, urgency, frequency, hematuria, flank pain and difficulty urinating.  Endocrine: Denies: hot or cold intolerance, sweats, changes in hair or nails, polyuria, polydipsia. Musculoskeletal: Denies myalgias, back pain, joint swelling, arthralgias and gait problem.  Skin: Denies pallor, rash and wound.  Neurological: Denies dizziness, seizures, syncope, weakness, light-headedness, numbness and headaches.  Hematological: Denies adenopathy. Easy bruising, personal or family bleeding history  Psychiatric/Behavioral: Denies suicidal ideation, mood changes, confusion, nervousness, sleep disturbance and agitation    Physical Exam: Vitals:   10/21/18 0739  BP: 110/70  Pulse: 64  Temp: 98.2 F (36.8 C)  TempSrc: Oral  Weight: 100 lb 11.2 oz (45.7 kg)    Body mass index is 19.67 kg/m.   Constitutional: NAD, calm, comfortable Eyes: PERRL, lids and conjunctivae normal ENMT: Mucous membranes are moist. Posterior pharynx clear of any exudate or lesions. Normal  dentition.  Neck: normal, supple, no masses, no thyromegaly Respiratory: clear to auscultation bilaterally, no wheezing, no crackles. Normal respiratory effort. No accessory muscle use.  Cardiovascular: Regular rate and rhythm, no murmurs / rubs / gallops. No extremity edema. 2+ pedal pulses. No carotid bruits.  Abdomen: no tenderness, no masses palpated. No hepatosplenomegaly. Bowel sounds positive.  Musculoskeletal: no clubbing / cyanosis. No joint deformity upper and lower extremities. Good ROM, no contractures. Normal muscle tone.  Skin: no rashes, lesions, ulcers. No induration Neurologic: CN 2-12 grossly intact. Sensation intact, DTR normal. Strength 5/5 in all 4.  Psychiatric: Normal judgment and insight. Alert and oriented x 3. Normal mood.    Impression and Plan:  Screening for osteoporosis - Plan: DG Bone  Density    Dyslipidemia -On atorvastatin 40. -Cardiology has ordered repeat lipids for next month.  Essential hypertension -Very well controlled. -Advised to continue current medications.  Tobacco abuse -I have discussed tobacco cessation with the patient.  I have counseled the patient regarding the negative impacts of continued tobacco use including but not limited to lung cancer, COPD, and cardiovascular disease.  I have discussed alternatives to tobacco and modalities that may help facilitate tobacco cessation including but not limited to biofeedback, hypnosis, and medications.  Total time spent with tobacco counseling was 4 minutes. -I am willing to see her every 2 weeks for continued smoking education. -She is now in the pre-contemplative stage.  We will continue to address at every visit.   Diabetes mellitus type 2 in nonobese [facility]) - Lab Results  Component Value Date   HGBA1C 6.5 (A) 08/05/2018    -Well-controlled. -Not due for A1c today.  To continue metformin.  Preventive measures -DEXA scan requested today. -Patient advised to schedule eye exam as soon as possible. -Diabetic foot exam performed today and documented in chart. -Will add vitamin D, vitamin B12 and TSH to labs ordered by cardiology for next month.     Patient Instructions  -Keep up the good work!  Your diabetes and blood pressure are very well controlled.  -Make sure you schedule your eye exam as soon as possible.  -We will schedule your DEXA scan.  -Flu shot today.     Lelon Frohlich, MD [place] [facility]

## 2018-10-23 DIAGNOSIS — E119 Type 2 diabetes mellitus without complications: Secondary | ICD-10-CM | POA: Diagnosis not present

## 2018-10-29 ENCOUNTER — Ambulatory Visit: Payer: PPO | Admitting: Cardiology

## 2018-11-02 DIAGNOSIS — H00021 Hordeolum internum right upper eyelid: Secondary | ICD-10-CM | POA: Diagnosis not present

## 2018-11-03 ENCOUNTER — Ambulatory Visit (INDEPENDENT_AMBULATORY_CARE_PROVIDER_SITE_OTHER): Payer: PPO | Admitting: Orthopaedic Surgery

## 2018-11-03 ENCOUNTER — Ambulatory Visit (INDEPENDENT_AMBULATORY_CARE_PROVIDER_SITE_OTHER): Payer: PPO

## 2018-11-03 DIAGNOSIS — G8929 Other chronic pain: Secondary | ICD-10-CM | POA: Diagnosis not present

## 2018-11-03 DIAGNOSIS — M5441 Lumbago with sciatica, right side: Secondary | ICD-10-CM | POA: Diagnosis not present

## 2018-11-03 MED ORDER — PREDNISONE 10 MG (21) PO TBPK: ORAL_TABLET | ORAL | 0 refills | Status: DC

## 2018-11-03 NOTE — Progress Notes (Signed)
Office Visit Note   Patient: [name]           Date of Birth: January 11, 1934           MRN: [id number] Visit Date: 11/03/2018              Requested by: Isaac Bliss, Rayford Halsted, MD [place], [place] [id number] PCP: Isaac Bliss, Rayford Halsted, MD   Assessment & Plan: Visit Diagnoses:  1. Chronic right-sided low back pain with right-sided sciatica     Plan: Right low back pain with radiculopathy.  Hip x-rays reviewed which show no significant arthritis.  Lumbar films obtained today show a significant level scoliosis and degenerative changes.  Her pain is more consistent with nerve root irritation -We will place a steroid taper -Physical therapy -NSAIDs or Tylenol as needed -Follow-up as needed  Follow-Up Instructions: Return if symptoms worsen or fail to improve.   Orders:  Orders Placed This Encounter  Procedures  . XR Lumbar Spine 2-3 Views   Meds ordered this encounter  Medications  . predniSONE (STERAPRED UNI-PAK 21 TAB) 10 MG (21) TBPK tablet    Sig: Take as directed    Dispense:  21 tablet    Refill:  0      Procedures: No procedures performed   Clinical Data: No additional findings.   Subjective: Chief Complaint  Patient presents with  . Right Hip - Pain    HPI  84 year old female with approximately 4 months of posterior right hip pain that radiates into the right thigh.  Patient denies any specific injury.  Her pain is made worse with prolonged standing.  Is relieved with rest and occasionally NSAIDs.  She also reports occasional tingling sensation to the thigh in a similar distribution.  Overall she tries to remain active this but the symptoms.  She presents today for evaluation due to an upcoming trip to [place].  She denies any significant weakness.  No associated skin changes.  No history of similar symptoms in the past.  Review of Systems  See HPI   Objective: Vital Signs: There were no vitals taken for this  visit.  Physical Exam  GEN: Awake, alert, no acute distress Pulmonary: Breathing unlabored  Ortho Exam  Right hip:  - Inspection: No gross deformity, no swelling, erythema, or ecchymosis - Palpation: No TTP, specifically none over greater trochanter - ROM: Limited internal rotation.  Symmetric bilaterally.  Without pain.  Normal flexion and external rotation - Strength:  4+/5 strength in flexion and abduction.  Patient does have generalized weakness.  No focal deficits - Neuro/vasc: NV intact distally - Special Tests: Negative FABER and FADIR.  Negative logroll.    Lumbar spine: -Mild diffuse tenderness over the right lumbar spine -Limited range of motion in flexion extension -Negative straight leg raise -Sensation intact in bilateral lower extremities  Specialty Comments:  No specialty comments available.  Imaging: Xr Lumbar Spine 2-3 Views  Result Date: 11/03/2018 Severe degenerative scoliosis.    PMFS History: Patient Active Problem List   Diagnosis Date Noted  . Moderate mitral regurgitation 04/28/2018  . Diabetes mellitus type 2 in nonobese ([place]) 03/25/2018  . Dilated cbd, acquired 03/08/2015  . Acute biliary pancreatitis 03/06/2015  . Atypical chest pain 10/06/2013  . Carotid artery disease ([place]) 12/13/2012  . Tobacco abuse 02/20/2012  . RAYNAUD'S SYNDROME 11/30/2009  . TRIGEMINAL NEURALGIA 04/14/2008  . Dyslipidemia 07/23/2007  . Essential hypertension 07/23/2007  . CERVICAL CANCER, HX OF 07/23/2007  Past Medical History:  Diagnosis Date  . At risk for medication noncompliance   . CAD (coronary artery disease)    a. borderline disease by CT 08/2018.  . Diabetes mellitus, type 2 ([place])   . History of cervical cancer   . Hyperlipidemia   . Hypertension   . Moderate mitral regurgitation   . Noncompliance with medication treatment due to underuse of medication   . Raynaud's phenomenon   . TN (trigeminal neuralgia)     Family History  Problem Relation  Age of Onset  . CAD Neg Hx   . Diabetes Mellitus II Neg Hx     Past Surgical History:  Procedure Laterality Date  . BREAST BIOPSY    . BREAST ENHANCEMENT SURGERY    . CHOLECYSTECTOMY N/A 02/14/2014   Procedure: LAPAROSCOPIC CHOLECYSTECTOMY;  Surgeon: Ralene Ok, MD;  Location: [facility];  Service: General;  Laterality: N/A;  . ERCP N/A 03/08/2015   Procedure: ENDOSCOPIC RETROGRADE CHOLANGIOPANCREATOGRAPHY (ERCP);  Surgeon: Ladene Artist, MD;  Location: [facility];  Service: Endoscopy;  Laterality: N/A;  . MASS EXCISION Right 12/25/2016   Procedure: EXCISION RIGHT SCALP MASS SEBACEOUS CYST;  Surgeon: Coralie Keens, MD;  Location: [facility];  Service: General;  Laterality: Right;  . TOTAL ABDOMINAL HYSTERECTOMY  1963   for cervical cancer in situ   Social History   Occupational History  . 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 Smoking status: Current Every Day Smoker    Packs/day: 1.50    Years: 60.00    Pack years: 90.00    Types: Cigarettes  . Smokeless tobacco: Never Used  Substance and Sexual Activity  . Alcohol use: No  . Drug use: No  . Sexual activity: Not on file

## 2018-11-04 ENCOUNTER — Ambulatory Visit: Payer: PPO | Admitting: Internal Medicine

## 2018-11-04 DIAGNOSIS — H04123 Dry eye syndrome of bilateral lacrimal glands: Secondary | ICD-10-CM | POA: Diagnosis not present

## 2018-11-11 ENCOUNTER — Other Ambulatory Visit: Payer: PPO

## 2018-11-11 DIAGNOSIS — I2510 Atherosclerotic heart disease of native coronary artery without angina pectoris: Secondary | ICD-10-CM | POA: Diagnosis not present

## 2018-11-11 DIAGNOSIS — R5383 Other fatigue: Secondary | ICD-10-CM

## 2018-11-11 DIAGNOSIS — I340 Nonrheumatic mitral (valve) insufficiency: Secondary | ICD-10-CM

## 2018-11-11 DIAGNOSIS — I10 Essential (primary) hypertension: Secondary | ICD-10-CM | POA: Diagnosis not present

## 2018-11-11 DIAGNOSIS — E785 Hyperlipidemia, unspecified: Secondary | ICD-10-CM | POA: Diagnosis not present

## 2018-11-11 LAB — LIPID PANEL
CHOLESTEROL TOTAL: 248 mg/dL — AB (ref 100–199)
Chol/HDL Ratio: 2.7 ratio (ref 0.0–4.4)
HDL: 92 mg/dL (ref >39)
LDL Calculated: 105 mg/dL — ABNORMAL HIGH (ref 0–99)
TRIGLYCERIDES: 255 mg/dL — AB (ref 0–149)
VLDL Cholesterol Cal: 51 mg/dL — ABNORMAL HIGH (ref 5–40)

## 2018-11-11 LAB — HEPATIC FUNCTION PANEL
ALT: 18 IU/L (ref 0–32)
AST: 18 IU/L (ref 0–40)
Albumin: 4.3 g/dL (ref 3.5–4.7)
Alkaline Phosphatase: 111 IU/L (ref 39–117)
BILIRUBIN TOTAL: 0.4 mg/dL (ref 0.0–1.2)
BILIRUBIN, DIRECT: 0.13 mg/dL (ref 0.00–0.40)
Total Protein: 6.4 g/dL (ref 6.0–8.5)

## 2018-11-12 ENCOUNTER — Telehealth: Payer: Self-pay | Admitting: Physician Assistant

## 2018-11-12 ENCOUNTER — Telehealth: Payer: Self-pay | Admitting: *Deleted

## 2018-11-12 DIAGNOSIS — E785 Hyperlipidemia, unspecified: Secondary | ICD-10-CM

## 2018-11-12 MED ORDER — NITROGLYCERIN 0.4 MG SL SUBL: 0.4000 mg | SUBLINGUAL_TABLET | SUBLINGUAL | 3 refills | Status: DC | PRN

## 2018-11-12 MED ORDER — ATORVASTATIN CALCIUM 80 MG PO TABS: 80.0000 mg | ORAL_TABLET | Freq: Every day | ORAL | 3 refills | Status: DC

## 2018-11-12 NOTE — Telephone Encounter (Signed)
-----   Message from Charlie Pitter, [place] sent at 11/11/2018  5:43 PM EST ----- Please let patient know lipid profile actually looks worse than before we started atorvastatin. Is she taking this and was she fasting when these were drawn? In 09/2018 we started 40mg  daily. She has had issues with compliance in the past.  If she affirms she is taking please call pt's pharmacy to assure she has filled it. If so, can increase to 80mg  qpm with f/u lipid profile and liver in 6 weeks. If has not been taking, please start and draw repeat labs in 6 weeks.  Dayna Dunn PA-C

## 2018-11-12 NOTE — Telephone Encounter (Signed)
Discussed with Dayna and pt needs to go to ED for eval and tx Also should have PMD to write excuse for upcoming trip Per pt does not want to go to ED at this time if has another epsiode will go Pt's son aware of all the recommendations./cy

## 2018-11-12 NOTE — Telephone Encounter (Signed)
Pt c/o of Chest Pain: STAT if CP now or developed within 24 hours  1. Are you having CP right now?  Not at this time  2. Are you experiencing any other symptoms (ex. SOB, nausea, vomiting, sweating)? no  3. How long have you been experiencing CP? Yesterday it happen a couple times and lasted for about 15 minutes- Have had some episodes prior to yesterday  4. Is your CP continuous or coming and going? Off and on- sometimes goes for days without chest pain  5. Have you taken Nitroglycerin? no ?

## 2018-11-12 NOTE — Telephone Encounter (Signed)
Called pt re: lab results, left a message for pt to call back.  

## 2018-11-12 NOTE — Telephone Encounter (Addendum)
Pt aware of lab results and was fasting and taking Atorvastatin 40 mg Pt aware to increase Atorvastatin to 80 every day and f/u labs in 6 weeks(first week of FEB)the patient also has had chest pain once in a while last 10-15 min and then goes away. Also pt has upcoming trip and would like a letter excusing from planned trip as does not feel well . Will discuss with Dyna Dunn PA for recommendations fresh bottle of ntg sent to pharmacy ./cy

## 2018-11-12 NOTE — Telephone Encounter (Signed)
Did not need tiis encounter

## 2018-11-13 NOTE — Telephone Encounter (Signed)
Spoke with pt's son and was able to grant my chart access activation code was given and son was able to log in to help with pt's medical care

## 2018-12-14 ENCOUNTER — Telehealth: Payer: Self-pay | Admitting: Cardiology

## 2018-12-14 ENCOUNTER — Telehealth: Payer: Self-pay | Admitting: Internal Medicine

## 2018-12-14 ENCOUNTER — Telehealth: Payer: Self-pay | Admitting: Physician Assistant

## 2018-12-14 NOTE — Telephone Encounter (Signed)
Patient went to Cardiology wanting a note stating she was not healthy enough to go on her trip to [place].  This is so she won't incur extra fees.  Cardiology told her to come to family practice to get the not.  Telephone notes are in the patient's chart from Cardiology.

## 2018-12-14 NOTE — Telephone Encounter (Signed)
New Message   Patient needs a note for airline stating that she is not healty enough to fly to [place] - patient is in lobby now

## 2018-12-14 NOTE — Telephone Encounter (Signed)
Error

## 2018-12-14 NOTE — Telephone Encounter (Signed)
The patient walked in and she wanted to know if our office could write her a note to the airliner that she was unsafe to fly, because she wanted to cancel her flight and get a refund. Dr. Radford Pax reviewed her chart and stated the patient has no restrictions on flying. The patient had concerns about her back. Advised that the PCP would be the provider to write this letter if it applied medically. She expressed understanding.

## 2018-12-15 NOTE — Telephone Encounter (Signed)
Yes

## 2018-12-15 NOTE — Telephone Encounter (Signed)
Okay to write letter 

## 2018-12-15 NOTE — Telephone Encounter (Signed)
Letter is ready for pick up and patient is aware.

## 2018-12-25 ENCOUNTER — Other Ambulatory Visit: Payer: Self-pay | Admitting: Pharmacist

## 2018-12-25 NOTE — Patient Outreach (Signed)
[facility]) Care Management  12/25/2018  [name] Jun 27, 1934 [id number]  Patient failed statin, ACEi/ARB, and diabetes adherence measures in 2019. Screening patient to determine if any pharmacy intervention would benefit medication adherence for 2020.  Per chart review, patient's statin was discontinued from 03/24/2018-10/01/2018 d/t elevated LFTs, has since been restarted. This explains statin nonadherence. However, still a documented history of medication non-compliance per PCP and cardiology notes.   [employer] officer, [employer]. Lisinopril last picked up 10/07/2018 for a 90 day supply. Metformin last filled 11/21/2018 for a 90 day supply. Atorvastatin last filled 11/13/2018 for a 90 day supply.   Contacted patient; left HIPAA compliant message for her to return my call at her convenience.   [name], PharmD PGY2 Ambulatory Care Pharmacy Resident, [place] [facility] Phone: [phone]

## 2018-12-31 ENCOUNTER — Other Ambulatory Visit: Payer: PPO

## 2018-12-31 DIAGNOSIS — E785 Hyperlipidemia, unspecified: Secondary | ICD-10-CM

## 2018-12-31 LAB — HEPATIC FUNCTION PANEL
ALBUMIN: 4.5 g/dL (ref 3.6–4.6)
ALT: 15 IU/L (ref 0–32)
AST: 22 IU/L (ref 0–40)
Alkaline Phosphatase: 90 IU/L (ref 39–117)
Bilirubin Total: 0.4 mg/dL (ref 0.0–1.2)
Bilirubin, Direct: 0.14 mg/dL (ref 0.00–0.40)
TOTAL PROTEIN: 6.8 g/dL (ref 6.0–8.5)

## 2018-12-31 LAB — LIPID PANEL
Chol/HDL Ratio: 2.7 ratio (ref 0.0–4.4)
Cholesterol, Total: 200 mg/dL — ABNORMAL HIGH (ref 100–199)
HDL: 73 mg/dL (ref >39)
LDL CALC: 84 mg/dL (ref 0–99)
Triglycerides: 217 mg/dL — ABNORMAL HIGH (ref 0–149)
VLDL CHOLESTEROL CAL: 43 mg/dL — AB (ref 5–40)

## 2019-01-01 ENCOUNTER — Ambulatory Visit (INDEPENDENT_AMBULATORY_CARE_PROVIDER_SITE_OTHER): Payer: PPO | Admitting: Internal Medicine

## 2019-01-01 ENCOUNTER — Other Ambulatory Visit: Payer: Self-pay | Admitting: Pharmacist

## 2019-01-01 ENCOUNTER — Telehealth: Payer: Self-pay | Admitting: *Deleted

## 2019-01-01 ENCOUNTER — Encounter: Payer: Self-pay | Admitting: Internal Medicine

## 2019-01-01 VITALS — BP 120/74 | HR 78 | Temp 98.4°F | Wt 99.4 lb

## 2019-01-01 DIAGNOSIS — E785 Hyperlipidemia, unspecified: Secondary | ICD-10-CM

## 2019-01-01 DIAGNOSIS — M545 Low back pain, unspecified: Secondary | ICD-10-CM

## 2019-01-01 DIAGNOSIS — Z720 Tobacco use: Secondary | ICD-10-CM

## 2019-01-01 DIAGNOSIS — R05 Cough: Secondary | ICD-10-CM | POA: Diagnosis not present

## 2019-01-01 DIAGNOSIS — I2510 Atherosclerotic heart disease of native coronary artery without angina pectoris: Secondary | ICD-10-CM

## 2019-01-01 DIAGNOSIS — R059 Cough, unspecified: Secondary | ICD-10-CM

## 2019-01-01 MED ORDER — DICLOFENAC SODIUM 1 % TD GEL: 4.0000 g | Freq: Four times a day (QID) | TRANSDERMAL | 1 refills | Status: DC

## 2019-01-01 NOTE — Telephone Encounter (Signed)
-----   Message from Charlie Pitter, PA-C sent at 01/01/2019  6:39 AM EST ----- LDL improved but not at goal. Given pt's hx of compliance issues do not think this can really be adequately addressed via phone. Recommend lipid clinic visit. Dayna Dunn PA-C

## 2019-01-01 NOTE — Telephone Encounter (Signed)
Called pt son, JIm, DPR on file, re: lab results and left a message for him to call back.

## 2019-01-01 NOTE — Telephone Encounter (Signed)
Spoke with pts son, [name], [place] on file.  He has been a made aware that pt will need to see Lipid Clinic re: cholesterol since there have been some compliance issues with pt and medications. Son does suggest he come with pt to appt, so will ask that they call the son to make arrangement. It's also been advised that pt bring in her pill box to this appt as well. Son agrees and thanked me for the return call.

## 2019-01-01 NOTE — Patient Instructions (Addendum)
Nice seeing you today.  Instructions: -increase water intake and get plenty of rest -take zrytec 10mg  daily for cough -take delsym OTC for cough as needed -cont to use voltaren gel to lower back 2x a day -cont to use tylenol as needed for back pain  Chronic Back Pain When back pain lasts longer than 3 months, it is called chronic back pain. Pain may get worse at certain times (flare-ups). There are things you can do at home to manage your pain. Follow these instructions at home: Activity      Avoid bending and other activities that make pain worse.  When standing: ? Keep your upper back and neck straight. ? Keep your shoulders pulled back. ? Avoid slouching.  When sitting: ? Keep your back straight. ? Relax your shoulders. Do not round your shoulders or pull them backward.  Do not sit or stand in one place for long periods of time.  Take short rest breaks during the day. Lying down or standing is usually better than sitting. Resting can help relieve pain.  When sitting or lying down for a long time, do some mild activity or stretching. This will help to prevent stiffness and pain.  Get regular exercise. Ask your doctor what activities are safe for you.  Do not lift anything that is heavier than 10 lb (4.5 kg). To prevent injury when you lift things: ? Bend your knees. ? Keep the weight close to your body. ? Avoid twisting. Managing pain  If told, put ice on the painful area. Your doctor may tell you to use ice for 24-48 hours after a flare-up starts. ? Put ice in a plastic bag. ? Place a towel between your skin and the bag. ? Leave the ice on for 20 minutes, 2-3 times a day.  If told, put heat on the painful area as often as told by your doctor. Use the heat source that your doctor recommends, such as a moist heat pack or a heating pad. ? Place a towel between your skin and the heat source. ? Leave the heat on for 20-30 minutes. ? Remove the heat if your skin turns  bright red. This is especially important if you are unable to feel pain, heat, or cold. You may have a greater risk of getting burned.  Soak in a warm bath. This can help relieve pain.  Take over-the-counter and prescription medicines only as told by your doctor. General instructions  Sleep on a firm mattress. Try lying on your side with your knees slightly bent. If you lie on your back, put a pillow under your knees.  Keep all follow-up visits as told by your doctor. This is important. Contact a doctor if:  You have pain that does not get better with rest or medicine. Get help right away if:  One or both of your arms or legs feel weak.  One or both of your arms or legs lose feeling (numbness).  You have trouble controlling when you poop (bowel movement) or pee (urinate).  You feel sick to your stomach (nauseous).  You throw up (vomit).  You have belly (abdominal) pain.  You have shortness of breath.  You pass out (faint). Summary  When back pain lasts longer than 3 months, it is called chronic back pain.  Pain may get worse at certain times (flare-ups).  Use ice and heat as told by your doctor. Your doctor may tell you to use ice after flare-ups. This information is not intended  to replace advice given to you by your health care provider. Make sure you discuss any questions you have with your health care provider. Document Released: 04/29/2008 Document Revised: 06/26/2017 Document Reviewed: 06/26/2017 Elsevier Interactive Patient Education  2019 [employer].

## 2019-01-01 NOTE — Patient Outreach (Signed)
[facility]) Care Management  01/01/2019  [name] [birth year] [id number]   Patient failed statin, ACEi/ARB, and diabetes adherence measures in 2019. Screening patient to determine if any pharmacy intervention would benefit medication adherence for 2020.  Per chart review, cardiology clinic has been communicating with patient's son, [name], today regarding adherence to statin medication. Patient is being referred to lipid clinic for additional support and medication management.   Contacted son, [name]. Left HIPAA compliant message to return my call at his convenience.   [name], PharmD, [place] PGY2 Ambulatory Care Pharmacy Resident, [place] [facility] Phone: [phone]

## 2019-01-01 NOTE — Telephone Encounter (Signed)
Follow Up:; ° ° °Returning your call. °

## 2019-01-01 NOTE — Progress Notes (Signed)
Established Patient Office Visit     CC/Reason for Visit: cough & low back pain  HPI: [name] is a 85 y.o. female who is coming in today for the above mentioned reasons. Past Medical History is significant for hypertension, diabetes, CAD, hyperlipidemia, tobacco abuse and cervical cancer.  Patient c/o of cough and lower back pain, shes been taking tylenol each am.  The back pain is a chronic condition that comes and goes, but this time the pain has lasted 4 days.  She says the back pain is at its worst first thing in the mornings.  Patient sts she attends the local gym and does a lot of pool excercises.  Denies shortness of breath, fever, N/D, headache but sts she did vomit a couple of days ago after eating a meal.  She believes the food was old.  She cont to smoke 1ppd.  She c/o coughing up phelm a couple of times a day.     Past Medical/Surgical History: Past Medical History:  Diagnosis Date  . At risk for medication noncompliance   . CAD (coronary artery disease)    a. borderline disease by CT 08/2018.  . Diabetes mellitus, type 2 ([place])   . History of cervical cancer   . Hyperlipidemia   . Hypertension   . Moderate mitral regurgitation   . Noncompliance with medication treatment due to underuse of medication   . Raynaud's phenomenon   . TN (trigeminal neuralgia)     Past Surgical History:  Procedure Laterality Date  . BREAST BIOPSY    . BREAST ENHANCEMENT SURGERY    . CHOLECYSTECTOMY N/A 02/14/2014   Procedure: LAPAROSCOPIC CHOLECYSTECTOMY;  Surgeon: Ralene Ok, MD;  Location: [facility];  Service: General;  Laterality: N/A;  . ERCP N/A 03/08/2015   Procedure: ENDOSCOPIC RETROGRADE CHOLANGIOPANCREATOGRAPHY (ERCP);  Surgeon: Ladene Artist, MD;  Location: [facility];  Service: Endoscopy;  Laterality: N/A;  . MASS EXCISION Right 12/25/2016   Procedure: EXCISION RIGHT SCALP MASS SEBACEOUS CYST;  Surgeon: Coralie Keens, MD;  Location: [facility];  Service:  General;  Laterality: Right;  . TOTAL ABDOMINAL HYSTERECTOMY  1963   for cervical cancer in situ    Social History:  reports that she has been smoking cigarettes. She has a 90.00 pack-year smoking history. She has never used smokeless tobacco. She reports that she does not drink alcohol or use drugs.  Allergies: Allergies  Allergen Reactions  . Morphine And Related Itching    Short lived, mild itching.    Family History:  Family History  Problem Relation Age of Onset  . CAD Neg Hx   . Diabetes Mellitus II Neg Hx      Current Outpatient Medications:  .  acetaminophen (TYLENOL) 500 MG tablet, Take 500 mg by mouth 2 (two) times daily as needed for moderate pain., Disp: , Rfl:  .  aspirin EC 81 MG tablet, Take 81 mg by mouth at bedtime., Disp: , Rfl:  .  atorvastatin (LIPITOR) 80 MG tablet, Take 1 tablet (80 mg total) by mouth daily., Disp: 90 tablet, Rfl: 3 .  Blood Glucose Monitoring Suppl (ONE TOUCH ULTRA MINI) w/Device KIT, USE TO CHECK BLOOD SUGAR DAILY AND AS NEEDED, Disp: 1 each, Rfl: 0 .  carboxymethylcellulose (REFRESH PLUS) 0.5 % SOLN, Place 1 drop into both eyes 2 (two) times daily., Disp: , Rfl:  .  diclofenac sodium (VOLTAREN) 1 % GEL, Apply 4 g topically 4 (four) times daily. For joint pain ., Disp:  4 Tube, Rfl: 1 .  glucose blood (ACCU-CHEK GUIDE) test strip, Use as instructed, Disp: 100 each, Rfl: 12 .  hydrALAZINE (APRESOLINE) 25 MG tablet, Take 1 tablet (25 mg total) by mouth 2 (two) times daily., Disp: 180 tablet, Rfl: 2 .  hydrochlorothiazide (HYDRODIURIL) 12.5 MG tablet, Take 1 tablet (12.5 mg total) by mouth daily., Disp: 90 tablet, Rfl: 3 .  Lancets (ONETOUCH ULTRASOFT) lancets, Once daily or PRN, Dx E11.9, Disp: 100 each, Rfl: 12 .  meclizine (ANTIVERT) 25 MG tablet, TAKE 1 TABLET BY MOUTH THREE TIMES DAILY AS NEEDED FOR DIZZINESS, Disp: 60 tablet, Rfl: 4 .  metFORMIN (GLUCOPHAGE) 500 MG tablet, Take 1 tablet (500 mg total) by mouth 2 (two) times daily with a  meal., Disp: 180 tablet, Rfl: 4 .  Multiple Vitamins-Minerals (WOMENS 50+ MULTI VITAMIN/MIN PO), Take 1 tablet by mouth daily with lunch. , Disp: , Rfl:  .  nitroGLYCERIN (NITROSTAT) 0.4 MG SL tablet, Place 1 tablet (0.4 mg total) under the tongue every 5 (five) minutes as needed for chest pain., Disp: 50 tablet, Rfl: 3 .  omeprazole (PRILOSEC) 20 MG capsule, Take 1 capsule (20 mg total) by mouth daily., Disp: 90 capsule, Rfl: 3 .  predniSONE (STERAPRED UNI-PAK 21 TAB) 10 MG (21) TBPK tablet, Take as directed, Disp: 21 tablet, Rfl: 0 .  TURMERIC CURCUMIN PO, Take 1 capsule by mouth daily with lunch. , Disp: , Rfl:  .  amLODipine (NORVASC) 10 MG tablet, Take 1 tablet (10 mg total) by mouth daily., Disp: 180 tablet, Rfl: 3 .  lisinopril (PRINIVIL,ZESTRIL) 20 MG tablet, Take 1 tablet (20 mg total) by mouth daily., Disp: 90 tablet, Rfl: 3  Review of Systems:  Constitutional: Denies fever, chills, diaphoresis, appetite change and fatigue.  HEENT: Denies photophobia, eye pain, redness, hearing loss, ear pain, sore throat, sneezing, mouth sores, trouble swallowing, neck pain, neck stiffness and tinnitus.  C/o coughing up phelm and runny nose Respiratory: Denies SOB, DOE,  chest tightness,  and wheezing.  C/o cough Cardiovascular: Denies chest pain, palpitations and leg swelling.  Gastrointestinal: Denies nausea, vomiting, abdominal pain, diarrhea, constipation, blood in stool and abdominal distention.  Genitourinary: Denies dysuria, urgency, frequency, hematuria, flank pain and difficulty urinating.  Endocrine: Denies: hot or cold intolerance, sweats, changes in hair or nails, polyuria, polydipsia. Musculoskeletal: Denies myalgias, back pain, joint swelling, arthralgias and gait problem.  Skin: Denies pallor, rash and wound.  Neurological: Denies dizziness, seizures, syncope, weakness, light-headedness, numbness and headaches.  Hematological: Denies adenopathy. Easy bruising, personal or family bleeding  history  Psychiatric/Behavioral: Denies suicidal ideation, mood changes, confusion, nervousness, sleep disturbance and agitation   Physical Exam: Vitals:   01/01/19 1300  BP: 120/74  Pulse: 78  Temp: 98.4 F (36.9 C)  TempSrc: Oral  SpO2: 94%  Weight: 99 lb 6.4 oz (45.1 kg)    Body mass index is 19.41 kg/m.   Constitutional: NAD, calm, comfortable Eyes: PERRL, lids and conjunctivae normal ENMT: Mucous membranes are moist. Posterior pharynx has erythema but clear of any exudate or lesions. Normal dentition. Tympanic membrane is pearly white, no erythema or bulging. Respiratory: diminished but clear to auscultation bilaterally, no wheezing, no crackles. Normal respiratory effort. No accessory muscle use.  Cardiovascular: Regular rate and rhythm, no murmurs / rubs / gallops. No extremity edema. 2+ pedal pulses.  Abdomen: no tenderness, no masses palpated. No hepatosplenomegaly. Bowel sounds positive.  Musculoskeletal: no clubbing / cyanosis. No joint deformity upper and lower extremities. Good ROM, no contractures. Normal  muscle tone.  Psychiatric: Normal judgment and insight. Alert and oriented x 3. Normal mood.    Impression and Plan:  Tobacco abuse Cough -Suspect cough from post-nasal drip as she has been complaining of rhinorrhea lately as well. -Advised OTC antihistamine. -Also a component of smoker's cough but she is not interested in smoking cessation.  Acute midline low back pain without sciatica  -Advised continued physical activity, voltaren gel. -No radiculopathy.   Patient Instructions  Nice seeing you today.  Instructions: -increase water intake and get plenty of rest -take zrytec 19m daily for cough -take delsym OTC for cough as needed -cont to use voltaren gel to lower back 2x a day -cont to use tylenol as needed for back pain  Chronic Back Pain When back pain lasts longer than 3 months, it is called chronic back pain. Pain may get worse at certain  times (flare-ups). There are things you can do at home to manage your pain. Follow these instructions at home: Activity      Avoid bending and other activities that make pain worse.  When standing: ? Keep your upper back and neck straight. ? Keep your shoulders pulled back. ? Avoid slouching.  When sitting: ? Keep your back straight. ? Relax your shoulders. Do not round your shoulders or pull them backward.  Do not sit or stand in one place for long periods of time.  Take short rest breaks during the day. Lying down or standing is usually better than sitting. Resting can help relieve pain.  When sitting or lying down for a long time, do some mild activity or stretching. This will help to prevent stiffness and pain.  Get regular exercise. Ask your doctor what activities are safe for you.  Do not lift anything that is heavier than 10 lb (4.5 kg). To prevent injury when you lift things: ? Bend your knees. ? Keep the weight close to your body. ? Avoid twisting. Managing pain  If told, put ice on the painful area. Your doctor may tell you to use ice for 24-48 hours after a flare-up starts. ? Put ice in a plastic bag. ? Place a towel between your skin and the bag. ? Leave the ice on for 20 minutes, 2-3 times a day.  If told, put heat on the painful area as often as told by your doctor. Use the heat source that your doctor recommends, such as a moist heat pack or a heating pad. ? Place a towel between your skin and the heat source. ? Leave the heat on for 20-30 minutes. ? Remove the heat if your skin turns bright red. This is especially important if you are unable to feel pain, heat, or cold. You may have a greater risk of getting burned.  Soak in a warm bath. This can help relieve pain.  Take over-the-counter and prescription medicines only as told by your doctor. General instructions  Sleep on a firm mattress. Try lying on your side with your knees slightly bent. If you lie  on your back, put a pillow under your knees.  Keep all follow-up visits as told by your doctor. This is important. Contact a doctor if:  You have pain that does not get better with rest or medicine. Get help right away if:  One or both of your arms or legs feel weak.  One or both of your arms or legs lose feeling (numbness).  You have trouble controlling when you poop (bowel movement) or pee (urinate).  You feel sick to your stomach (nauseous).  You throw up (vomit).  You have belly (abdominal) pain.  You have shortness of breath.  You pass out (faint). Summary  When back pain lasts longer than 3 months, it is called chronic back pain.  Pain may get worse at certain times (flare-ups).  Use ice and heat as told by your doctor. Your doctor may tell you to use ice after flare-ups. This information is not intended to replace advice given to you by your health care provider. Make sure you discuss any questions you have with your health care provider. Document Released: 04/29/2008 Document Revised: 06/26/2017 Document Reviewed: 06/26/2017 Elsevier Interactive Patient Education  2019 [place], RN [employer] Student [facility]

## 2019-01-05 ENCOUNTER — Other Ambulatory Visit: Payer: Self-pay | Admitting: Pharmacist

## 2019-01-05 NOTE — Patient Outreach (Signed)
[facility]) Care Management  01/05/2019  [name] Jun 17, 1934 [id number]  Patient failed statin, ACEi/ARB, and diabetes adherence measures in 2019. Screening patient to determine if any pharmacy intervention would benefit medication adherence for 2020.  Contacted patient's son and DPR, [name], who helps manage her medications. HIPAA verifiers identified. He noted that he has recently been more involved in her care, and has been helping her fill pill boxes for a few weeks at a time. He notes that her LDL has improved, showing that she is likely taking the atorvastatin more regularly than she had previously. She has filled metformin, atorvastatin, and lisinopril appropriately in the last 3 months. Her son declines any questions or concerns at this time.   Provided my contact information should any future questions or medication related needs arise.   [name], PharmD, [place] PGY2 Ambulatory Care Pharmacy Resident, [facility] Phone: [phone]

## 2019-01-27 ENCOUNTER — Ambulatory Visit (INDEPENDENT_AMBULATORY_CARE_PROVIDER_SITE_OTHER): Payer: PPO | Admitting: Pharmacist

## 2019-01-27 ENCOUNTER — Encounter: Payer: Self-pay | Admitting: Pharmacist

## 2019-01-27 DIAGNOSIS — E785 Hyperlipidemia, unspecified: Secondary | ICD-10-CM

## 2019-01-27 MED ORDER — EZETIMIBE 10 MG PO TABS: 10.0000 mg | ORAL_TABLET | Freq: Every day | ORAL | 3 refills | Status: DC

## 2019-01-27 NOTE — Patient Instructions (Addendum)
Start taking ezetimibe 10mg  once daily.  Fasting lab work prior to your appointment with Dr. Radford Pax on 5/22 to check your lipids

## 2019-01-27 NOTE — Progress Notes (Signed)
Patient ID: [name]                 DOB: 09-Jul-1934                    MRN: [id number]     HPI: [name] is a 85 y.o. female patient of Dr. Theodosia Blender referred to lipid clinic by Melina Copa, PA. PMH is significant for DM, HTN, HL, trigeminal neuralgia, Raynaud's disease, moderate MR by echo 03/2018, dilated CBD/abnormal LFTs, abnormal stress test, CAD by cardiac CT, and intermittent medication noncompliance with med reconciliation issues. She was last seen in clinic by [facility] on 10/01/2018 where atorvastatin 3m was initiated due to results of cardiac CT.   Patient presents today to discuss cholesterol. She was brought by her brother, but he wished to stay in the waiting room. Patient does speak [place] however an interpreter may be beneficial in the future. According to phone notes, patient son was going to attend today's visit. However son was not present today.   Patient states she is taking her Lipitor. It appears atorvastatin was increased to 840ma day in December. However patient does not know what strength she is taking.  She had questions about what her omeprazole was for and also showed me a bottle of nitroglycerin and stated that it makes her chest feel tight. I asked her how many times she has had to take this recently and she says she takes it almost everyday. I asked if she has chest pain everyday and she states no. I explained that this medication is only to be taken for chest pain and if she has to take it, she should go to the hospital for evaluation of her chest pain.  Current Medications: atrovastatin 8024maily Risk Factors: CAD, HTN, DM LDL goal: <70  Diet: rice, vegetables, chicken  Exercise: goes to gym and swims at least 3 days a week  Family History: The patient's family history is negative for CAD and Diabetes Mellitus II.  Social History: +tobacco use  Labs:12/31/2018 TC 200, TG 217, HDL 73, LDL 84 (atorvastatin 69m31m12/18/2019  TC 248, TG 255, HDL 92, LDL 105 (atorvastatin 40mg57mast Medical History:  Diagnosis Date  . At risk for medication noncompliance   . CAD (coronary artery disease)    a. borderline disease by CT 08/2018.  . Diabetes mellitus, type 2 ([facility]) [place] History of cervical cancer   . Hyperlipidemia   . Hypertension   . Moderate mitral regurgitation   . Noncompliance with medication treatment due to underuse of medication   . Raynaud's phenomenon   . TN (trigeminal neuralgia)     Current Outpatient Medications on File Prior to Visit  Medication Sig Dispense Refill  . acetaminophen (TYLENOL) 500 MG tablet Take 500 mg by mouth 2 (two) times daily as needed for moderate pain.    . amLMarland KitchenDipine (NORVASC) 10 MG tablet Take 1 tablet (10 mg total) by mouth daily. 180 tablet 3  . aspirin EC 81 MG tablet Take 81 mg by mouth at bedtime.    . atoMarland Kitchenvastatin (LIPITOR) 80 MG tablet Take 1 tablet (80 mg total) by mouth daily. 90 tablet 3  . Blood Glucose Monitoring Suppl (ONE TOUCH ULTRA MINI) w/Device KIT USE TO CHECK BLOOD SUGAR DAILY AND AS NEEDED 1 each 0  . carboxymethylcellulose (REFRESH PLUS) 0.5 % SOLN Place 1 drop into both eyes 2 (two) times daily.    . diclofenac sodium (VOLTAREN) 1 %  GEL Apply 4 g topically 4 (four) times daily. For joint pain . 4 Tube 1  . glucose blood (ACCU-CHEK GUIDE) test strip Use as instructed 100 each 12  . hydrALAZINE (APRESOLINE) 25 MG tablet Take 1 tablet (25 mg total) by mouth 2 (two) times daily. 180 tablet 2  . hydrochlorothiazide (HYDRODIURIL) 12.5 MG tablet Take 1 tablet (12.5 mg total) by mouth daily. 90 tablet 3  . Lancets (ONETOUCH ULTRASOFT) lancets Once daily or PRN, Dx E11.9 100 each 12  . lisinopril (PRINIVIL,ZESTRIL) 20 MG tablet Take 1 tablet (20 mg total) by mouth daily. 90 tablet 3  . meclizine (ANTIVERT) 25 MG tablet TAKE 1 TABLET BY MOUTH THREE TIMES DAILY AS NEEDED FOR DIZZINESS 60 tablet 4  . metFORMIN (GLUCOPHAGE) 500 MG tablet Take 1 tablet  (500 mg total) by mouth 2 (two) times daily with a meal. 180 tablet 4  . Multiple Vitamins-Minerals (WOMENS 50+ MULTI VITAMIN/MIN PO) Take 1 tablet by mouth daily with lunch.     . nitroGLYCERIN (NITROSTAT) 0.4 MG SL tablet Place 1 tablet (0.4 mg total) under the tongue every 5 (five) minutes as needed for chest pain. 50 tablet 3  . omeprazole (PRILOSEC) 20 MG capsule Take 1 capsule (20 mg total) by mouth daily. 90 capsule 3  . predniSONE (STERAPRED UNI-PAK 21 TAB) 10 MG (21) TBPK tablet Take as directed 21 tablet 0  . TURMERIC CURCUMIN PO Take 1 capsule by mouth daily with lunch.      No current facility-administered medications on file prior to visit.     Allergies  Allergen Reactions  . Morphine And Related Itching    Short lived, mild itching.    Assessment/Plan:  1. Hyperlipidemia - LDL is above goal of <70. Explained to the patient in simples terms and using a model the process of plaque build up, and how it can cause heart attacks and strokes. Explained to her how the Lipitor is working to help prevent those things from happening and how adding another medication will help get her bad cholesterol lower and reduce her risk of heart attacks and strokes. Patient agreeable to starting zetia 46m daily. She has an appointment with Dr. TRadford Paxon May 22. I have scheduled patient to have labs prior to this appointment.  I also called the patients son, on DPR, and reviewed what I had discussed with patient and the plan for treatment. All sons questions answered. Son appreciative of the call.   Thank you,  MRamond Dial Pharm.D, [place] [address]. [address] [place] [address] Phone: [phone] Fax: [phone]

## 2019-02-24 ENCOUNTER — Telehealth: Payer: Self-pay

## 2019-02-24 NOTE — Telephone Encounter (Signed)
Author phoned pt. to assess interest in scheduling virtual awv. No answer, author left detailed VM asking for return call if interested in scheduling with PCP virtually.

## 2019-04-16 ENCOUNTER — Other Ambulatory Visit: Payer: Self-pay

## 2019-04-16 ENCOUNTER — Other Ambulatory Visit: Payer: PPO | Admitting: *Deleted

## 2019-04-16 ENCOUNTER — Other Ambulatory Visit: Payer: PPO

## 2019-04-16 ENCOUNTER — Ambulatory Visit: Payer: PPO | Admitting: Cardiology

## 2019-04-16 DIAGNOSIS — E785 Hyperlipidemia, unspecified: Secondary | ICD-10-CM

## 2019-04-16 LAB — HEPATIC FUNCTION PANEL
ALT: 15 IU/L (ref 0–32)
AST: 19 IU/L (ref 0–40)
Albumin: 4.1 g/dL (ref 3.6–4.6)
Alkaline Phosphatase: 82 IU/L (ref 39–117)
Bilirubin Total: 0.5 mg/dL (ref 0.0–1.2)
Bilirubin, Direct: 0.2 mg/dL (ref 0.00–0.40)
Total Protein: 6 g/dL (ref 6.0–8.5)

## 2019-04-16 LAB — LIPID PANEL
Chol/HDL Ratio: 2.7 ratio (ref 0.0–4.4)
Cholesterol, Total: 148 mg/dL (ref 100–199)
HDL: 55 mg/dL (ref >39)
LDL Calculated: 54 mg/dL (ref 0–99)
Triglycerides: 194 mg/dL — ABNORMAL HIGH (ref 0–149)
VLDL Cholesterol Cal: 39 mg/dL (ref 5–40)

## 2019-04-22 ENCOUNTER — Telehealth: Payer: Self-pay

## 2019-04-22 DIAGNOSIS — E785 Hyperlipidemia, unspecified: Secondary | ICD-10-CM

## 2019-04-22 MED ORDER — ICOSAPENT ETHYL 1 G PO CAPS: 2.0000 g | ORAL_CAPSULE | Freq: Two times a day (BID) | ORAL | 3 refills | Status: DC

## 2019-04-22 NOTE — Telephone Encounter (Signed)
Spoke with the patient, she accepted taking Vascepa 2g, twice a day. Lipid and liver recheck set up for 07/19/19.

## 2019-04-22 NOTE — Telephone Encounter (Signed)
-----   Message from Sueanne Margarita, MD sent at 04/18/2019  8:53 PM EDT ----- Triglycerides too high.  Add Vascepa 2gm BID and repeat FLp and ALT in 3 months

## 2019-05-21 ENCOUNTER — Telehealth: Payer: Self-pay | Admitting: *Deleted

## 2019-05-21 MED ORDER — METFORMIN HCL 500 MG PO TABS: 500.0000 mg | ORAL_TABLET | Freq: Two times a day (BID) | ORAL | 1 refills | Status: DC

## 2019-05-21 NOTE — Telephone Encounter (Signed)
Copied from [place] [id number]. Topic: General - Other >> May 20, 2019  2:41 PM Leward Quan A wrote: Reason for CRM: Pharmacy called to say the patient is out of her metFORMIN (GLUCOPHAGE) 500 MG tablet and have been out for a while now. Asking for an Rx sent over today please.

## 2019-07-06 ENCOUNTER — Encounter (HOSPITAL_COMMUNITY): Payer: Self-pay | Admitting: Obstetrics and Gynecology

## 2019-07-06 ENCOUNTER — Emergency Department (HOSPITAL_COMMUNITY): Payer: PPO

## 2019-07-06 ENCOUNTER — Other Ambulatory Visit: Payer: Self-pay

## 2019-07-06 ENCOUNTER — Emergency Department (HOSPITAL_COMMUNITY)
Admission: EM | Admit: 2019-07-06 | Discharge: 2019-07-07 | Discharge status: Against Medical Advice | Payer: PPO | Attending: Emergency Medicine | Admitting: Emergency Medicine

## 2019-07-06 DIAGNOSIS — R0602 Shortness of breath: Secondary | ICD-10-CM | POA: Diagnosis not present

## 2019-07-06 DIAGNOSIS — Z5321 Procedure and treatment not carried out due to patient leaving prior to being seen by health care provider: Secondary | ICD-10-CM | POA: Insufficient documentation

## 2019-07-06 DIAGNOSIS — R079 Chest pain, unspecified: Secondary | ICD-10-CM | POA: Diagnosis not present

## 2019-07-06 DIAGNOSIS — R0789 Other chest pain: Secondary | ICD-10-CM | POA: Diagnosis present

## 2019-07-06 MED ORDER — SODIUM CHLORIDE 0.9% FLUSH: 3.0000 mL | Freq: Once | INTRAVENOUS | Status: DC

## 2019-07-06 NOTE — ED Notes (Signed)
Blood draw x2 attempts Unsuccessful

## 2019-07-06 NOTE — ED Triage Notes (Signed)
Pt reports she was having heavy chest pain this morning that has since eased up. Pt reports nausea and diarrhea but no emesis.  Pt reports SOB at times but not frequently.  Pt is alert and oriented and reports no active distress at this time.

## 2019-07-12 ENCOUNTER — Other Ambulatory Visit: Payer: Self-pay | Admitting: Physician Assistant

## 2019-07-13 ENCOUNTER — Other Ambulatory Visit: Payer: Self-pay | Admitting: *Deleted

## 2019-07-13 MED ORDER — OMEPRAZOLE 20 MG PO CPDR: 20.0000 mg | DELAYED_RELEASE_CAPSULE | Freq: Every day | ORAL | 1 refills | Status: DC

## 2019-07-13 MED ORDER — MECLIZINE HCL 25 MG PO TABS: ORAL_TABLET | ORAL | 1 refills | Status: DC

## 2019-07-15 ENCOUNTER — Ambulatory Visit (INDEPENDENT_AMBULATORY_CARE_PROVIDER_SITE_OTHER): Payer: PPO | Admitting: Internal Medicine

## 2019-07-15 ENCOUNTER — Encounter: Payer: Self-pay | Admitting: Internal Medicine

## 2019-07-15 ENCOUNTER — Other Ambulatory Visit: Payer: Self-pay

## 2019-07-15 VITALS — BP 130/70 | HR 94 | Temp 98.2°F | Wt 98.7 lb

## 2019-07-15 DIAGNOSIS — R072 Precordial pain: Secondary | ICD-10-CM | POA: Diagnosis not present

## 2019-07-15 NOTE — Progress Notes (Signed)
Established Patient Office Visit     CC/Reason for Visit: ED follow up  HPI: [name] is a 85 y.o. female who is coming in today for the above mentioned reasons. This visit was intended to be an ER follow up. She is here with her son today who lives out of state. Patient is a poor historian. I am starting to have concerns about memory impairment as she does not seem to recall instructions from previous appointments. Son received a phone call from a neighbor on 8/11 stating that she had CP all day. She was taken to ED. She says she went but then left because the wait was too long. Looks like she had a CXR and an EKG but no lab work and was not seen by a provider. She states that day she had SSCP and trouble breathing, like something heavy was on her chest. It got better so she didn't want to wait in the ED. On questioning, she has been taking NTG every day but not because of CP, she thought this was supposed to be an every day drug. She has had this same conversation multiple times with cardiology as well. She has had 1 more episode since. Usually when she is walking around gets better after taking a nap. She has also been very tired and fatigued.  Past Medical/Surgical History: Past Medical History:  Diagnosis Date   At risk for medication noncompliance    CAD (coronary artery disease)    a. borderline disease by CT 08/2018.   Diabetes mellitus, type 2 ([place])    History of cervical cancer    Hyperlipidemia    Hypertension    Moderate mitral regurgitation    Noncompliance with medication treatment due to underuse of medication    Raynaud's phenomenon    TN (trigeminal neuralgia)     Past Surgical History:  Procedure Laterality Date   BREAST BIOPSY     BREAST ENHANCEMENT SURGERY     CHOLECYSTECTOMY N/A 02/14/2014   Procedure: LAPAROSCOPIC CHOLECYSTECTOMY;  Surgeon: Ralene Ok, MD;  Location: [facility];  Service: General;  Laterality: N/A;   ERCP N/A  03/08/2015   Procedure: ENDOSCOPIC RETROGRADE CHOLANGIOPANCREATOGRAPHY (ERCP);  Surgeon: Ladene Artist, MD;  Location: [facility];  Service: Endoscopy;  Laterality: N/A;   MASS EXCISION Right 12/25/2016   Procedure: EXCISION RIGHT SCALP MASS SEBACEOUS CYST;  Surgeon: Coralie Keens, MD;  Location: [place];  Service: General;  Laterality: Right;   [place]   for cervical cancer in situ    Social History:  reports that she has been smoking cigarettes. She has a 90.00 pack-year smoking history. She has never used smokeless tobacco. She reports that she does not drink alcohol or use drugs.  Allergies: Allergies  Allergen Reactions   Morphine And Related Itching    Short lived, mild itching.    Family History:  Family History  Problem Relation Age of Onset   CAD Neg Hx    Diabetes Mellitus II Neg Hx      Current Outpatient Medications:    acetaminophen (TYLENOL) 500 MG tablet, Take 500 mg by mouth 2 (two) times daily as needed for moderate pain., Disp: , Rfl:    aspirin EC 81 MG tablet, Take 81 mg by mouth at bedtime., Disp: , Rfl:    Blood Glucose Monitoring Suppl (ONE TOUCH ULTRA MINI) w/Device KIT, USE TO CHECK BLOOD SUGAR DAILY AND AS NEEDED, Disp: 1 each, Rfl: 0  carboxymethylcellulose (REFRESH PLUS) 0.5 % SOLN, Place 1 drop into both eyes 2 (two) times daily., Disp: , Rfl:    diclofenac sodium (VOLTAREN) 1 % GEL, Apply 4 g topically 4 (four) times daily. For joint pain ., Disp: 4 Tube, Rfl: 1   glucose blood (ACCU-CHEK GUIDE) test strip, Use as instructed, Disp: 100 each, Rfl: 12   hydrALAZINE (APRESOLINE) 25 MG tablet, Take 1 tablet (25 mg total) by mouth 2 (two) times daily., Disp: 180 tablet, Rfl: 2   hydrochlorothiazide (HYDRODIURIL) 12.5 MG tablet, Take 1 tablet (12.5 mg total) by mouth daily., Disp: 90 tablet, Rfl: 3   Icosapent Ethyl 1 g CAPS, Take 2 capsules (2 g total) by mouth 2 (two) times a day., Disp: 360 capsule, Rfl: 3    Lancets (ONETOUCH ULTRASOFT) lancets, Once daily or PRN, Dx E11.9, Disp: 100 each, Rfl: 12   meclizine (ANTIVERT) 25 MG tablet, TAKE 1 TABLET BY MOUTH THREE TIMES DAILY AS NEEDED FOR DIZZINESS, Disp: 60 tablet, Rfl: 1   metFORMIN (GLUCOPHAGE) 500 MG tablet, Take 1 tablet (500 mg total) by mouth 2 (two) times daily with a meal., Disp: 180 tablet, Rfl: 1   Multiple Vitamins-Minerals (WOMENS 50+ MULTI VITAMIN/MIN PO), Take 1 tablet by mouth daily with lunch. , Disp: , Rfl:    nitroGLYCERIN (NITROSTAT) 0.4 MG SL tablet, Place 1 tablet (0.4 mg total) under the tongue every 5 (five) minutes as needed for chest pain., Disp: 50 tablet, Rfl: 3   omeprazole (PRILOSEC) 20 MG capsule, Take 1 capsule (20 mg total) by mouth daily., Disp: 90 capsule, Rfl: 1   predniSONE (STERAPRED UNI-PAK 21 TAB) 10 MG (21) TBPK tablet, Take as directed, Disp: 21 tablet, Rfl: 0   TURMERIC CURCUMIN PO, Take 1 capsule by mouth daily with lunch. , Disp: , Rfl:    amLODipine (NORVASC) 10 MG tablet, Take 1 tablet (10 mg total) by mouth daily., Disp: 180 tablet, Rfl: 3   atorvastatin (LIPITOR) 80 MG tablet, Take 1 tablet (80 mg total) by mouth daily., Disp: 90 tablet, Rfl: 3   ezetimibe (ZETIA) 10 MG tablet, Take 1 tablet (10 mg total) by mouth daily., Disp: 90 tablet, Rfl: 3   lisinopril (PRINIVIL,ZESTRIL) 20 MG tablet, Take 1 tablet (20 mg total) by mouth daily., Disp: 90 tablet, Rfl: 3  Review of Systems:  Constitutional: Denies fever, chills, diaphoresis, appetite change and fatigue.  HEENT: Denies photophobia, eye pain, redness, hearing loss, ear pain, congestion, sore throat, rhinorrhea, sneezing, mouth sores, trouble swallowing, neck pain, neck stiffness and tinnitus.   Respiratory: Denies SOB, DOE, cough, chest tightness,  and wheezing.   Cardiovascular: Denies  palpitations and leg swelling.  Gastrointestinal: Denies nausea, vomiting, abdominal pain, diarrhea, constipation, blood in stool and abdominal distention.    Genitourinary: Denies dysuria, urgency, frequency, hematuria, flank pain and difficulty urinating.  Endocrine: Denies: hot or cold intolerance, sweats, changes in hair or nails, polyuria, polydipsia. Musculoskeletal: Denies myalgias, back pain, joint swelling, arthralgias and gait problem.  Skin: Denies pallor, rash and wound.  Neurological: Denies dizziness, seizures, syncope, weakness, light-headedness, numbness and headaches.  Hematological: Denies adenopathy. Easy bruising, personal or family bleeding history  Psychiatric/Behavioral: Denies suicidal ideation, mood changes, confusion, nervousness, sleep disturbance and agitation    Physical Exam: Vitals:   07/15/19 1411  BP: 130/70  Pulse: 94  Temp: 98.2 F (36.8 C)  TempSrc: Temporal  SpO2: 94%  Weight: 98 lb 11.2 oz (44.8 kg)    Body mass index is 21.36 kg/m.  Constitutional: NAD, calm, comfortable Eyes: PERRL, lids and conjunctivae normal ENMT: Mucous membranes are moist.  Neck: normal, supple, no masses, no thyromegaly Respiratory: clear to auscultation bilaterally, no wheezing, no crackles. Normal respiratory effort. No accessory muscle use.  Cardiovascular: Regular rate and rhythm, no murmurs / rubs / gallops. No extremity edema. 2+ pedal pulses. No carotid bruits.  Abdomen: no tenderness, no masses palpated. No hepatosplenomegaly. Bowel sounds positive.  Musculoskeletal: no clubbing / cyanosis. No joint deformity upper and lower extremities. Good ROM, no contractures. Normal muscle tone.    Impression and Plan:  Precordial pain  -No pain today. -EKG in ED on 8/11 without acute ischemic changes. -She does have a h/o CAD and has not seen cards in a while. -Will make referral for her to be seen early next week. -In the meantime she has been advised to go to the ED if she develops recurrent CP.        Lelon Frohlich, MD [facility]

## 2019-07-19 ENCOUNTER — Other Ambulatory Visit: Payer: Self-pay

## 2019-07-19 ENCOUNTER — Other Ambulatory Visit: Payer: PPO | Admitting: *Deleted

## 2019-07-19 DIAGNOSIS — E785 Hyperlipidemia, unspecified: Secondary | ICD-10-CM

## 2019-07-19 LAB — HEPATIC FUNCTION PANEL
ALT: 11 IU/L (ref 0–32)
AST: 16 IU/L (ref 0–40)
Albumin: 4 g/dL (ref 3.6–4.6)
Alkaline Phosphatase: 78 IU/L (ref 39–117)
Bilirubin Total: 0.5 mg/dL (ref 0.0–1.2)
Bilirubin, Direct: 0.16 mg/dL (ref 0.00–0.40)
Total Protein: 6 g/dL (ref 6.0–8.5)

## 2019-07-19 LAB — LIPID PANEL
Chol/HDL Ratio: 2.2 ratio (ref 0.0–4.4)
Cholesterol, Total: 128 mg/dL (ref 100–199)
HDL: 58 mg/dL (ref >39)
LDL Calculated: 40 mg/dL (ref 0–99)
Triglycerides: 150 mg/dL — ABNORMAL HIGH (ref 0–149)
VLDL Cholesterol Cal: 30 mg/dL (ref 5–40)

## 2019-07-20 ENCOUNTER — Telehealth: Payer: Self-pay | Admitting: *Deleted

## 2019-07-20 NOTE — Telephone Encounter (Signed)
DPR on file ok to s/w pt's son who has been notified of pt's lab results by phone with verbal understanding. Pt's son also request to be called when the pt comes in for her visit on 8/31 @ 10:15 with Dr. Radford Pax. He said he lives in a different state but would like to be called @ [phone] and do a face time call so he see his mom and the doctor. I assured pt's son that I will make note of this on the appt notes as well. Pt's son thanked me for the call and my help. Patient notified of result.  Please refer to phone note from today for complete details.   Julaine Hua, [place] 07/20/2019 8:09 AM

## 2019-07-20 NOTE — Telephone Encounter (Signed)
-----   Message from Sueanne Margarita, MD sent at 07/19/2019  7:45 PM EDT ----- Stable labs - continue current meds and forward to PCP

## 2019-07-23 ENCOUNTER — Telehealth: Payer: Self-pay

## 2019-07-23 NOTE — Telephone Encounter (Signed)

## 2019-07-26 ENCOUNTER — Encounter: Payer: Self-pay | Admitting: Physician Assistant

## 2019-07-26 ENCOUNTER — Telehealth (INDEPENDENT_AMBULATORY_CARE_PROVIDER_SITE_OTHER): Payer: PPO | Admitting: Physician Assistant

## 2019-07-26 ENCOUNTER — Other Ambulatory Visit: Payer: Self-pay

## 2019-07-26 VITALS — Ht <= 58 in | Wt 98.0 lb

## 2019-07-26 DIAGNOSIS — I340 Nonrheumatic mitral (valve) insufficiency: Secondary | ICD-10-CM

## 2019-07-26 DIAGNOSIS — E785 Hyperlipidemia, unspecified: Secondary | ICD-10-CM

## 2019-07-26 DIAGNOSIS — I25119 Atherosclerotic heart disease of native coronary artery with unspecified angina pectoris: Secondary | ICD-10-CM

## 2019-07-26 DIAGNOSIS — K829 Disease of gallbladder, unspecified: Secondary | ICD-10-CM

## 2019-07-26 DIAGNOSIS — I10 Essential (primary) hypertension: Secondary | ICD-10-CM

## 2019-07-26 MED ORDER — ISOSORBIDE MONONITRATE ER 30 MG PO TB24: 30.0000 mg | ORAL_TABLET | Freq: Every day | ORAL | 3 refills | Status: DC

## 2019-07-26 NOTE — Progress Notes (Signed)
Virtual Visit via Telephone Note   This visit type was conducted due to national recommendations for restrictions regarding the COVID-19 Pandemic (e.g. social distancing) in an effort to limit this patient's exposure and mitigate transmission in our community.  Due to her co-morbid illnesses, this patient is at least at moderate risk for complications without adequate follow up.  This format is felt to be most appropriate for this patient at this time.  The patient did not have access to video technology/had technical difficulties with video requiring transitioning to audio format only (telephone).  All issues noted in this document were discussed and addressed.  No physical exam could be performed with this format.  Please refer to the patient's chart for her  consent to telehealth for [name].   Date:  07/26/2019   ID:  [name], DOB 29-Feb-1934, MRN [id number]  Patient Location: Home Provider Location: Home  PCP:  Isaac Bliss, Rayford Halsted, MD  Cardiologist:  [name], MD  / Melina Copa, PA-C  Electrophysiologist:  None   Evaluation Performed:  Follow-Up Visit  Chief Complaint:  Chest pain  History of Present Illness:    [name] is a 85 y.o. female with:  Coronary artery disease  Myoview 5/19 with anteroseptal and lateral ischemia  CTA 10/19: Coronary calcium 662, moderate CAD in the LAD and LCx with borderline significance in the LCx by [facility] >> medical therapy unless significant symptoms  Mitral regurgitation  Moderate by echo 5/19  Carotid artery disease  Diabetes mellitus  Hypertension  Hyperlipidemia  Raynard's disease  History of elevated LFTs with dilated common bile duct  Tobacco abuse  Trigeminal neuralgia  Medication non adherence  [name] was admitted in May 2019 with chest pain and elevated LFTs.  Please see the detailed notes by Melina Copa, PA-C from 10/01/18 for complete details.  Briefly, her  anatomy did not allow for ERCP and it was recommended that she have this performed at a tertiary center.  She preferred conservative management.  For her chest pain, a nuclear stress test demonstrated probable anteroseptal and lateral ischemia.  Follow-up coronary CTA was recommended.  The patient held off on this until October 2019.  This demonstrated mod dz in the LAD and LCx.  By [facility], she had borderline significant lesion in the mid LCx.  Recommendation was to manage this medically unless she has severe symptoms.  She has had difficulty adhering to medications in the past.  She went to the emergency room 07/06/2019 with chest discomfort.  She was not seen by provider but did have an EKG.  She followed up with her primary care provider who recommended referral back to cardiology for evaluation.  Her son joined the telephone call today to help with the history.  His name is [name].  The patient notes chest pain and shortness of breath occasionally over the past several months.  When she decided to go to the emergency room, it was more severe than typical.  She sometimes notes discomfort in her chest when she works out in the yard.  She feels fatigued with this.  She is able to do the same activities at other times without significant symptoms.  She has had a couple of episodes of nausea which have occurred after eating a greasy meal.  She has taken nitroglycerin in the past with relief of her chest discomfort.  She has not had radiating symptoms or associated nausea or diaphoresis.  She has not had orthopnea, PND or significant  leg swelling.  She has not had syncope.  The patient does not have symptoms concerning for COVID-19 infection (fever, chills, cough, or new shortness of breath).    Past Medical History:  Diagnosis Date   At risk for medication noncompliance    CAD (coronary artery disease)    a. borderline disease by CT 08/2018.   Diabetes mellitus, type 2 ([name])    History of cervical cancer      Hyperlipidemia    Hypertension    Moderate mitral regurgitation    Noncompliance with medication treatment due to underuse of medication    Raynaud's phenomenon    TN (trigeminal neuralgia)    Past Surgical History:  Procedure Laterality Date   BREAST BIOPSY     BREAST ENHANCEMENT SURGERY     CHOLECYSTECTOMY N/A 02/14/2014   Procedure: LAPAROSCOPIC CHOLECYSTECTOMY;  Surgeon: Ralene Ok, MD;  Location: [place];  Service: General;  Laterality: N/A;   ERCP N/A 03/08/2015   Procedure: ENDOSCOPIC RETROGRADE CHOLANGIOPANCREATOGRAPHY (ERCP);  Surgeon: Ladene Artist, MD;  Location: [facility];  Service: Endoscopy;  Laterality: N/A;   MASS EXCISION Right 12/25/2016   Procedure: EXCISION RIGHT SCALP MASS SEBACEOUS CYST;  Surgeon: Coralie Keens, MD;  Location: [place];  Service: General;  Laterality: Right;   [place]   for cervical cancer in situ     Current Meds  Medication Sig   acetaminophen (TYLENOL) 500 MG tablet Take 500 mg by mouth 2 (two) times daily as needed for moderate pain.   aspirin EC 81 MG tablet Take 81 mg by mouth at bedtime.   Blood Glucose Monitoring Suppl (ONE TOUCH ULTRA MINI) w/Device KIT USE TO CHECK BLOOD SUGAR DAILY AND AS NEEDED   carboxymethylcellulose (REFRESH PLUS) 0.5 % SOLN Place 1 drop into both eyes 2 (two) times daily.   diclofenac sodium (VOLTAREN) 1 % GEL Apply 4 g topically 4 (four) times daily. For joint pain .   glucose blood (ACCU-CHEK GUIDE) test strip Use as instructed   hydrALAZINE (APRESOLINE) 25 MG tablet Take 1 tablet (25 mg total) by mouth 2 (two) times daily.   Icosapent Ethyl 1 g CAPS Take 2 capsules (2 g total) by mouth 2 (two) times a day.   Lancets (ONETOUCH ULTRASOFT) lancets Once daily or PRN, Dx E11.9   meclizine (ANTIVERT) 25 MG tablet TAKE 1 TABLET BY MOUTH THREE TIMES DAILY AS NEEDED FOR DIZZINESS   metFORMIN (GLUCOPHAGE) 500 MG tablet Take 1 tablet (500 mg total) by mouth 2  (two) times daily with a meal.   Multiple Vitamins-Minerals (WOMENS 50+ MULTI VITAMIN/MIN PO) Take 1 tablet by mouth daily with lunch.    nitroGLYCERIN (NITROSTAT) 0.4 MG SL tablet Place 1 tablet (0.4 mg total) under the tongue every 5 (five) minutes as needed for chest pain.   omeprazole (PRILOSEC) 20 MG capsule Take 1 capsule (20 mg total) by mouth daily.   predniSONE (STERAPRED UNI-PAK 21 TAB) 10 MG (21) TBPK tablet Take as directed   TURMERIC CURCUMIN PO Take 1 capsule by mouth daily with lunch.    [DISCONTINUED] hydrochlorothiazide (HYDRODIURIL) 12.5 MG tablet Take 1 tablet (12.5 mg total) by mouth daily.     Allergies:   Morphine and related   Social History   Tobacco Use   Smoking status: Current Every Day Smoker    Packs/day: 1.50    Years: 60.00    Pack years: 90.00    Types: Cigarettes   Smokeless tobacco: Never Used  Substance  Use Topics   Alcohol use: No   Drug use: No     Family Hx: The patient's family history is negative for CAD and Diabetes Mellitus II.  ROS:   Please see the history of present illness.    All other systems reviewed and are negative.   Prior CV studies:   The following studies were reviewed today:  Coronary CTA 09/21/2018 IMPRESSION: 1. Coronary calcium score of 662. This was 15 percentile for age and sex matched control.  2. Normal coronary origin with right dominance.  3. Moderate CAD in the proximal and mid LAD and proximal and mid LCX arteries. Additional analysis with CT FFR will be submitted.  4. Severe mitral annular calcifications. FFR 1. Left Main:  No significant stenosis.  2. LAD: Proximal: 0.92, distal: 0.80. 3. D1: 0.81. 4. LCX: Proximal: 0.98, distal: 0.78. 5. RCA: Proximal: 0.98, distal: 0.85. 6. PDA: 0.81.  IMPRESSION: 1. CT FFR analysis showed stenosis in the mid portion of a non-dominant LCX artery. A medical management would be recommended unless there are significant symptoms.  Carotid US  07/31/2018 Bilateral ICA 1-39  Myoview 04/15/2018 Large size, moderate severity partially reversible anteroseptal and septal perfusion defect and a medium size, moderate severity partially reversible perfusion defect of the lateral wall. These findings are suggestive of ischemia, but artifact cannot be excluded. Clinical correlation is recommended and additional testing may be necessary.  Echocardiogram 03/25/2018 Mild concentric LVH, EF normal, normal wall motion, grade 1 diastolic dysfunction, MAC, moderate MR, mild LAE, PASP 32    Labs/Other Tests and Data Reviewed:    EKG:  An ECG dated 07/06/2019 was personally reviewed today and demonstrated:  Normal sinus rhythm, heart rate 75, left axis deviation, nonspecific ST-T wave changes, QTC 451  Recent Labs: 10/01/2018: BUN 17; Creatinine, Ser 0.82; Hemoglobin 15.9; Platelets 258; Potassium 4.5; Sodium 142 10/21/2018: TSH 0.87 07/19/2019: ALT 11   Recent Lipid Panel Lab Results  Component Value Date/Time   CHOL 128 07/19/2019 08:33 AM   TRIG 150 (H) 07/19/2019 08:33 AM   HDL 58 07/19/2019 08:33 AM   CHOLHDL 2.2 07/19/2019 08:33 AM   CHOLHDL 2.7 03/24/2018 07:30 PM   LDLCALC 40 07/19/2019 08:33 AM   LDLDIRECT 83.0 12/21/2014 10:40 AM    Wt Readings from Last 3 Encounters:  07/26/19 98 lb (44.5 kg)  07/15/19 98 lb 11.2 oz (44.8 kg)  01/01/19 99 lb 6.4 oz (45.1 kg)     Objective:    Vital Signs:  Ht _0  (1.448 m)    Wt 98 lb (44.5 kg)    BMI 21.21 kg/m    VITAL SIGNS:  reviewed GEN:  no acute distress RESPIRATORY:  No labored breathing NEURO:  Alert and oriented PSYCH:  Normal mood  ASSESSMENT & PLAN:    1. Coronary artery disease involving native coronary artery of native heart with angina pectoris (HCC) Moderate CAD by coronary CTA 10/19.  There was borderline significant stenosis in the mid LCx.  Medical therapy has been recommended unless she has worsening symptoms.  She recently went to the emergency room with chest  discomfort worse than her typical.  However, since then she has had more typical symptoms.  She does note relief with nitroglycerin at times.  Therefore, I have recommended adjusting her antianginal therapy to see if we can control her symptoms.  If she has progressive symptoms, she will likely need cardiac catheterization.  I reviewed this with her and her son today.  -Continue aspirin, Vascepa  -  Start Imdur 30 mg once daily   -Continue prn NTG  -Fu 2-4 weeks with Dr. Radford Pax or Melina Copa, PA-C (in person)  2. Gallbladder disease She has a history of elevated LFTs and dilated common bile duct.  She cannot undergo ERCP at that time.  She has not really had significant follow-up for her gallbladder since then.  She still occasionally has some discomfort and nausea related to greasy meals.  This can be further managed by primary care.  3. Essential hypertension Blood pressure is controlled.  I have asked her to stop taking HCTZ for now so that we can avoid hypotension with the initiation of isosorbide.  4. Dyslipidemia Recent LDL optimal on current therapy.  5. Moderate mitral regurgitation Moderate mitral regurgitation by echocardiogram May 2019.  No symptoms to suggest significant worsening.   Time:   Today, I have spent 32 minutes with the patient with telehealth technology discussing the above problems.    Medication Adjustments/Labs and Tests Ordered: Current medicines are reviewed at length with the patient today.  Concerns regarding medicines are outlined above.   Tests Ordered: No orders of the defined types were placed in this encounter.   Medication Changes: Meds ordered this encounter  Medications   isosorbide mononitrate (IMDUR) 30 MG 24 hr tablet    Sig: Take 1 tablet (30 mg total) by mouth daily.    Dispense:  90 tablet    Refill:  3    Follow Up:  In Person in 2-4 week(s)  Signed, Richardson Dopp, PA-C  07/26/2019 5:41 PM    [facility]

## 2019-07-26 NOTE — Patient Instructions (Addendum)
Medication Instructions:  Stop taking Hydrochlorothiazide  Start taking Isosorbide mononitrate (Imdur) 30 mg once daily   If you need a refill on your cardiac medications before your next appointment, please call your pharmacy.   Lab work: None   If you have labs (blood work) drawn today and your tests are completely normal, you will receive your results only by: [name] MyChart Message (if you have MyChart) OR . A paper copy in the mail If you have any lab test that is abnormal or we need to change your treatment, we will call you to review the results.  Testing/Procedures: None   Follow-Up: You are scheduled to see Melina Copa PA-C on 08/20/2019 @ 3:00 PM  Any Other Special Instructions Will Be Listed Below (If Applicable).

## 2019-08-20 ENCOUNTER — Ambulatory Visit: Payer: PPO | Admitting: Physician Assistant

## 2019-08-20 NOTE — Progress Notes (Deleted)
Cardiology Office Note    Date:  08/20/2019   ID:  [name], DOB Nov 09, 1934, MRN [id number]  PCP:  Isaac Bliss, Rayford Halsted, MD  Cardiologist:  [name], MD  Electrophysiologist:  None   Chief Complaint: f/u chest pain  History of Present Illness:   [name] is a 85 y.o. female with history of [place] female (speaks fairly good [place]). She has history of DM, HTN, HL, trigeminal neuralgia, Raynaud's disease, moderate MR by echo 03/2018, dilated CBD/abnormal LFTs, abnormal stress test with nonobstructive CAD by cardiac CT, and intermittent medication noncompliance with med reconciliation issues. I typically use the interpreter service with her. It should be noted however that even with interpreter services she can a vague (but cheerful) historian. She is here to follow-up chest pain.  To recap, she was admitted in 03/2018 with atypical chest pain and elevated blood pressure. She ruled out for MI but LFTs were significantly elevated along with lipase. US showed dilatation of CBD. MR showed no evidence of choledocholithiasis & possible stricuture of distal CBD. ERCP was recommended, though this was difficult to cannulate in the past for similar presentation in 2016. Her LFTs normalized. GI recommended referral to tertiary center for ERCP but given normalization in chemistries and symptoms, the patient elected to follow conservatively. During admission her echo 03/25/18 showed normal LV function, grade 1DD, moderate MR. Nuclear stress test 04/15/18 was abnormal but cannot exclude artifact.  I met her in 04/2018 to discuss Dr. Theodosia Blender recommendations to proceed with cardiac CT. She was noticing atypical episodic discomfort but she declined further evaluation. Amlodipine was added for empiric anti-anginal effect/HTN. However, there have been significant issues with medication compliance since that time. When she returned for follow-up 05/2018, she was reporting gait  unsteadiness. Blood pressure was difficult to obtain but in the 220/90 range. We had called her pharmacy and apparently she did pick up the amlodipine but had never picked up lisinopril that had been prescribed in the past. She was encouraged to take both. After that, hydralazine and HCTZ were added to her regimen. When seen in the office 08/03/18, she said she was taking "everything the doctor prescribes her," but then said she was only taking 1 BP medication. When shown the list she reported she was then taking all 4. We called her pharmacy and her lisinopril, HCTZ and hydralazine were all picked up in July except amlodipine which should have run out by then. She was describing episodic atypical CP at rest but not with activity. She was agreeable to cardiac CT 09/21/18. This showed 82nd percentile for age/sex, moderate CAD in the prox and mid LAD and proximal and mid LCX arteries. CT FFR analysis showed stenosis in the mid portion of a non-dominant LCX artery with distal FFR 0.78. Medical management was recommended unless there are significant symptoms. She went to the emergency room 07/06/2019 with chest discomfort.  She was not seen by provider but did have an EKG. She followed up with her primary care provider who recommended referral back to cardiology for evaluation. She saw Richardson Dopp 07/26/19 virtually at which time she reported further chest pain so Imdur was added and HCTZ was stopped. Last labs 07/19/19 showed normal LFTs, LDL 40, trig 150, 09/2018 Hgb 15.9, K 4.5, Cr 0.82.    [facility]   CAD with chest pain Essential HTN History of noncompliance with medications Hyperlipidemia    Past Medical History:  Diagnosis Date  . At risk for medication noncompliance   .  CAD (coronary artery disease)    a. borderline disease by CT 08/2018.  . Diabetes mellitus, type 2 ([place])   . History of cervical cancer   . Hyperlipidemia   . Hypertension   . Moderate mitral regurgitation   .  Noncompliance with medication treatment due to underuse of medication   . Raynaud's phenomenon   . TN (trigeminal neuralgia)     Past Surgical History:  Procedure Laterality Date  . BREAST BIOPSY    . BREAST ENHANCEMENT SURGERY    . CHOLECYSTECTOMY N/A 02/14/2014   Procedure: LAPAROSCOPIC CHOLECYSTECTOMY;  Surgeon: Ralene Ok, MD;  Location: [facility];  Service: General;  Laterality: N/A;  . ERCP N/A 03/08/2015   Procedure: ENDOSCOPIC RETROGRADE CHOLANGIOPANCREATOGRAPHY (ERCP);  Surgeon: Ladene Artist, MD;  Location: [facility];  Service: Endoscopy;  Laterality: N/A;  . MASS EXCISION Right 12/25/2016   Procedure: EXCISION RIGHT SCALP MASS SEBACEOUS CYST;  Surgeon: Coralie Keens, MD;  Location: [facility];  Service: General;  Laterality: Right;  . TOTAL ABDOMINAL HYSTERECTOMY  1963   for cervical cancer in situ    Current Medications: No outpatient medications have been marked as taking for the 08/20/19 encounter (Appointment) with Charlie Pitter, PA-C.   ***   Allergies:   Morphine and related   Social History   Socioeconomic History  . Marital status: Married    Spouse name: [name]  . Number of children: 1  . Years of education: Not on file  . Highest education level: Not on file  Occupational History  . 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 Financial resource strain: Not on file  . Food insecurity    Worry: Not on file    Inability: Not on file  . Transportation needs    Medical: Not on file    Non-medical: Not on file  Tobacco Use  . Smoking status: Current Every Day Smoker    Packs/day: 1.50    Years: 60.00    Pack years: 90.00    Types: Cigarettes  . Smokeless tobacco: Never Used  Substance and Sexual Activity  . Alcohol use: No  . Drug use: No  . Sexual activity: Not on file  Lifestyle  . Physical activity    Days per week: Not on file    Minutes per session: Not on file  . Stress: Not on file  Relationships  . Social  Herbalist on phone: Not on file    Gets together: Not on file    Attends religious service: Not on file    Active member of club or organization: Not on file    Attends meetings of clubs or organizations: Not on file    Relationship status: Not on file  Other Topics Concern  . Not on file  Social History Narrative   Married.  Lives in [place] with her husband.  No FH of heart disease.   1 adult son   + smoker   No EtOH     Family History:  The patient's ***family history is negative for CAD and Diabetes Mellitus II.  ROS:   Please see the history of present illness. Otherwise, review of systems is positive for ***.  All other systems are reviewed and otherwise negative.    EKGs/Labs/Other Studies Reviewed:    Studies reviewed were summarized above.   EKG:  EKG is ordered today, personally reviewed, demonstrating ***  Recent Labs: 10/01/2018: BUN 17; Creatinine, Ser 0.82;  Hemoglobin 15.9; Platelets 258; Potassium 4.5; Sodium 142 10/21/2018: TSH 0.87 07/19/2019: ALT 11  Recent Lipid Panel    Component Value Date/Time   CHOL 128 07/19/2019 0833   TRIG 150 (H) 07/19/2019 0833   HDL 58 07/19/2019 0833   CHOLHDL 2.2 07/19/2019 0833   CHOLHDL 2.7 03/24/2018 1930   VLDL 28 03/24/2018 1930   LDLCALC 40 07/19/2019 0833   LDLDIRECT 83.0 12/21/2014 1040    PHYSICAL EXAM:    VS:  There were no vitals taken for this visit.  BMI: There is no height or weight on file to calculate BMI.  GEN: Well nourished, well developed, in no acute distress HEENT: normocephalic, atraumatic Neck: no JVD, carotid bruits, or masses Cardiac: ***RRR; no murmurs, rubs, or gallops, no edema  Respiratory:  clear to auscultation bilaterally, normal work of breathing GI: soft, nontender, nondistended, + BS MS: no deformity or atrophy Skin: warm and dry, no rash Neuro:  Alert and Oriented x 3, Strength and sensation are intact, follows commands Psych: euthymic mood, full affect  Wt  Readings from Last 3 Encounters:  07/26/19 98 lb (44.5 kg)  07/15/19 98 lb 11.2 oz (44.8 kg)  01/01/19 99 lb 6.4 oz (45.1 kg)     ASSESSMENT & PLAN:   1. ***  Disposition: F/u with ***   Medication Adjustments/Labs and Tests Ordered: Current medicines are reviewed at length with the patient today.  Concerns regarding medicines are outlined above. Medication changes, Labs and Tests ordered today are summarized above and listed in the Patient Instructions accessible in Encounters.   Signed, Charlie Pitter, PA-C  08/20/2019 7:56 AM    [facility] [address], [place], [place]  [address] Phone: [phone]; Fax: [phone]

## 2019-08-23 NOTE — Telephone Encounter (Signed)
appointment

## 2019-09-02 ENCOUNTER — Ambulatory Visit (INDEPENDENT_AMBULATORY_CARE_PROVIDER_SITE_OTHER): Payer: PPO | Admitting: Physician Assistant

## 2019-09-02 ENCOUNTER — Other Ambulatory Visit: Payer: Self-pay

## 2019-09-02 ENCOUNTER — Telehealth: Payer: Self-pay | Admitting: *Deleted

## 2019-09-02 ENCOUNTER — Encounter: Payer: Self-pay | Admitting: Physician Assistant

## 2019-09-02 VITALS — BP 132/64 | HR 86 | Ht <= 58 in | Wt 98.0 lb

## 2019-09-02 DIAGNOSIS — I340 Nonrheumatic mitral (valve) insufficiency: Secondary | ICD-10-CM | POA: Diagnosis not present

## 2019-09-02 DIAGNOSIS — I25119 Atherosclerotic heart disease of native coronary artery with unspecified angina pectoris: Secondary | ICD-10-CM

## 2019-09-02 DIAGNOSIS — E785 Hyperlipidemia, unspecified: Secondary | ICD-10-CM

## 2019-09-02 DIAGNOSIS — I10 Essential (primary) hypertension: Secondary | ICD-10-CM | POA: Diagnosis not present

## 2019-09-02 NOTE — Patient Instructions (Addendum)
Medication Instructions:  Your physician recommends that you continue on your current medications as directed. Please refer to the Current Medication list given to you today.  If you need a refill on your cardiac medications before your next appointment, please call your pharmacy.   Lab work: TODAY:  BME & CBC  If you have labs (blood work) drawn today and your tests are completely normal, you will receive your results only by: [name] MyChart Message (if you have MyChart) OR . A paper copy in the mail If you have any lab test that is abnormal or we need to change your treatment, we will call you to review the results.  Testing/Procedures: None ordered  Follow-Up: At [facility], you and your health needs are our priority.  As part of our continuing mission to provide you with exceptional heart care, we have created designated Provider Care Teams.  These Care Teams include your primary Cardiologist (physician) and Advanced Practice Providers (APPs -  Physician Assistants and Nurse Practitioners) who all work together to provide you with the care you need, when you need it. . You have been scheduled for a virtual follow-up appointment with Melina Copa, PA-C, 09/07/2019 at 3:30.      Any Other Special Instructions Will Be Listed Below (If Applicable).

## 2019-09-02 NOTE — Progress Notes (Addendum)
Cardiology Office Note    Date:  09/02/2019   ID:  [name], DOB [birth year], MRN [id number]  PCP:  [name], Rayford Halsted, MD  Cardiologist:  Tara Him, MD  Electrophysiologist:  None   Chief Complaint: f/u chest pain  History of Present Illness:   [name] is a 85 y.o. female with history of [place] female. She has history of DM, HTN, HL, trigeminal neuralgia, Raynaud's disease, moderate MR by echo 03/2018, dilated CBD/abnormal LFTs, abnormal stress test with moderate CAD by cardiac CT, and intermittent medication noncompliance with medication reconciliation/health literacy issues.   She speaks fairly good [place]. It should be noted however that even with interpreter services she can a vague (but cheerful) historian. She is here to follow-up chest pain.  To recap, she was admitted in 03/2018 with atypical chest pain and elevated blood pressure. She ruled out for MI but LFTs were significantly elevated along with lipase. US showed dilatation of CBD. MR showed no evidence of choledocholithiasis & possible stricuture of distal CBD. ERCP was recommended, though this was difficult to cannulate in the past for similar presentation in 2016. Her LFTs normalized. GI recommended referral to tertiary center for ERCP but given normalization in chemistries and symptoms, the patient elected to follow conservatively. During admission her echo 03/25/18 showed normal LV function, grade 1 DD, moderate MR. Nuclear stress test 04/15/18 was abnormal but could not exclude artifact. I met her in 04/2018 to discuss Dr. Theodosia Blender recommendations to proceed with cardiac CT. She was noticing atypical episodic discomfort but she declined further evaluation. Amlodipine was added for empiric anti-anginal effect/HTN. However, there have been significant issues with medication compliance since that time. When she returned for follow-up 05/2018, she was reporting gait unsteadiness. Blood  pressure was difficult to obtain but in the 220/90 range. We had called her pharmacy and apparently she did pick up the amlodipine but had never picked up lisinopril that had been prescribed in the past. She was encouraged to take both. After that, hydralazine and HCTZ were added to her regimen. When seen in the office 08/03/18, she said she was taking "everything the doctor prescribes her," but then said she was only taking 1 BP medication. When shown the list she reported she was then taking all 4. We called her pharmacy and her lisinopril, HCTZ and hydralazine were all picked up in July except amlodipine which should have run out by then. She was describing episodic atypical CP at rest but not with activity. She was agreeable to cardiac CT 09/21/18. This showed 82nd percentile for age/sex, moderate CAD in the prox and mid LAD and proximal and mid LCX arteries. CT FFR analysis showed stenosis in the mid portion of a non-dominant LCX artery with distal FFR 0.78. Medical management was recommended unless there were significant symptoms. She went to the emergency room 07/06/2019 with chest discomfort.  She did not stay to be seen by provider but did have an EKG. She followed up with her primary care provider who recommended referral back to cardiology for evaluation. She saw Richardson Dopp 07/26/19 virtually at which time she reported further chest pain so Imdur was added and HCTZ was stopped. Last labs 07/19/19 showed normal LFTs, LDL 40, trig 150, 09/2018 Hgb 15.9, K 4.5, Cr 0.82.  She is seen back in person for follow-up today. As usual she is a cheerful but poor historian.  It is difficult to get consistent answers from her. Declined interpreter - Her son [name] lives out  of state and is present on the call today. (He was present on recent virtual visit call as well.) She has not had any major episodes of CP like the one that prompted ED visit in August. She is again unfortunately able to tell us what exactly she is  taking. She does report picking up the Imdur and starting it. She believes it has helped. She has had maybe 1-2 episodes of CP in the last 2 weeks for which she took SL NTG and then went to bed with resolution of symptoms. She is unable to consistently say if it is with rest or exertion but is generally able to work out in the yard and do ADLs without CP - at least after asking her this multiple ways.  Past Medical History:  Diagnosis Date   At risk for medication noncompliance    CAD (coronary artery disease)    a. borderline disease by CT 08/2018.   Diabetes mellitus, type 2 ([place])    History of cervical cancer    Hyperlipidemia    Hypertension    Moderate mitral regurgitation    Noncompliance with medication treatment due to underuse of medication    Raynaud's phenomenon    TN (trigeminal neuralgia)     Past Surgical History:  Procedure Laterality Date   BREAST BIOPSY     BREAST ENHANCEMENT SURGERY     CHOLECYSTECTOMY N/A 02/14/2014   Procedure: LAPAROSCOPIC CHOLECYSTECTOMY;  Surgeon: Ralene Ok, MD;  Location: [facility];  Service: General;  Laterality: N/A;   ERCP N/A 03/08/2015   Procedure: ENDOSCOPIC RETROGRADE CHOLANGIOPANCREATOGRAPHY (ERCP);  Surgeon: Ladene Artist, MD;  Location: [facility];  Service: Endoscopy;  Laterality: N/A;   MASS EXCISION Right 12/25/2016   Procedure: EXCISION RIGHT SCALP MASS SEBACEOUS CYST;  Surgeon: Coralie Keens, MD;  Location: [facility];  Service: General;  Laterality: Right;   [place]   for cervical cancer in situ    Current Medications: Current Meds  Medication Sig   acetaminophen (TYLENOL) 500 MG tablet Take 500 mg by mouth 2 (two) times daily as needed for moderate pain.   aspirin EC 81 MG tablet Take 81 mg by mouth at bedtime.   Blood Glucose Monitoring Suppl (ONE TOUCH ULTRA MINI) w/Device KIT USE TO CHECK BLOOD SUGAR DAILY AND AS NEEDED   carboxymethylcellulose (REFRESH PLUS) 0.5 % SOLN  Place 1 drop into both eyes 2 (two) times daily.   diclofenac sodium (VOLTAREN) 1 % GEL Apply 4 g topically 4 (four) times daily. For joint pain .   glucose blood (ACCU-CHEK GUIDE) test strip Use as instructed   hydrALAZINE (APRESOLINE) 25 MG tablet Take 1 tablet (25 mg total) by mouth 2 (two) times daily.   Icosapent Ethyl 1 g CAPS Take 2 capsules (2 g total) by mouth 2 (two) times a day.   isosorbide mononitrate (IMDUR) 30 MG 24 hr tablet Take 1 tablet (30 mg total) by mouth daily.   Lancets (ONETOUCH ULTRASOFT) lancets Once daily or PRN, Dx E11.9   meclizine (ANTIVERT) 25 MG tablet TAKE 1 TABLET BY MOUTH THREE TIMES DAILY AS NEEDED FOR DIZZINESS   metFORMIN (GLUCOPHAGE) 500 MG tablet Take 1 tablet (500 mg total) by mouth 2 (two) times daily with a meal.   Multiple Vitamins-Minerals (WOMENS 50+ MULTI VITAMIN/MIN PO) Take 1 tablet by mouth daily with lunch.    nitroGLYCERIN (NITROSTAT) 0.4 MG SL tablet Place 1 tablet (0.4 mg total) under the tongue every 5 (five) minutes as  needed for chest pain.   omeprazole (PRILOSEC) 20 MG capsule Take 1 capsule (20 mg total) by mouth daily.   TURMERIC CURCUMIN PO Take 1 capsule by mouth daily with lunch.      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  Social Designer, fashion/clothing strain: Not on file   Food insecurity    Worry: Not on file    Inability: Not on file   Transportation needs    Medical: Not on file    Non-medical: Not on file  Tobacco Use   Smoking status: Current Every Day Smoker    Packs/day: 1.50    Years: 60.00    Pack years: 90.00    Types: Cigarettes   Smokeless tobacco: Never Used  Substance and Sexual Activity   Alcohol use: No   Drug use:  No   Sexual activity: Not on file  Lifestyle   Physical activity    Days per week: Not on file    Minutes per session: Not on file   Stress: Not on file  Relationships   Social connections    Talks on phone: Not on file    Gets together: Not on file    Attends religious service: Not on file    Active member of club or organization: Not on file    Attends meetings of clubs or organizations: Not on file    Relationship status: Not on file  Other Topics Concern   Not on file  Social History Narrative   Married.  Lives in [place] with her husband.  No FH of heart disease.   1 adult son   + smoker   No EtOH     Family History:  The patient's family history is negative for CAD and Diabetes Mellitus II.  ROS:   Please see the history of present illness.  All other systems are reviewed and otherwise negative.    EKGs/Labs/Other Studies Reviewed:    Studies reviewed were summarized above.   EKG:  EKG is ordered today, personally reviewed, demonstrating NSR 86bpm, LAFB, nonspecific STT Changes similar to prior  Recent Labs: 10/01/2018: BUN 17; Creatinine, Ser 0.82; Hemoglobin 15.9; Platelets 258; Potassium 4.5; Sodium 142 10/21/2018: TSH 0.87 07/19/2019: ALT 11  Recent Lipid Panel    Component Value Date/Time   CHOL 128 07/19/2019 0833   TRIG 150 (H) 07/19/2019 0833   HDL 58 07/19/2019 0833   CHOLHDL 2.2 07/19/2019 0833   CHOLHDL 2.7 03/24/2018 1930   VLDL 28 03/24/2018 1930   LDLCALC 40 07/19/2019 0833   LDLDIRECT 83.0 12/21/2014 1040    PHYSICAL EXAM:    VS:  BP 132/64    Pulse 86    Ht '4\' 9"'  (1.448 m)    Wt 98 lb (44.5 kg)    BMI 21.21 kg/m   BMI: Body mass index is 21.21 kg/m.  GEN: Well nourished, well developed [place] F, in no acute distress HEENT: normocephalic, atraumatic Neck: no JVD, carotid bruits, or masses Cardiac: RRR; no murmurs, rubs, or gallops, no edema  Respiratory:  clear to auscultation bilaterally, normal work of breathing GI:  soft, nontender, nondistended, + BS MS: no deformity or atrophy Skin:  warm and dry, no rash Neuro:  Alert and Oriented x 3, Strength and sensation are intact, follows commands Psych: euthymic mood, full affect  Wt Readings from Last 3 Encounters:  09/02/19 98 lb (44.5 kg)  07/26/19 98 lb (44.5 kg)  07/15/19 98 lb 11.2 oz (44.8 kg)     ASSESSMENT & PLAN:   1. CAD with chest pain - it remains very difficult to get a sense for exactly whether this represents true angina. She has been managed medically in light of her medication compliance issues and prior preference to avoid procedures. She does seem to be doing better on the Imdur and reports she is pleased with how she is feeling now. She is not having any dyspnea or syncope. Her family lives out of state and she manages her medicines herself. In prior visits she's had a family friend look in on her with this but they were not particularly helpful in a prior visit either. At this point I think it would be extremely helpful to involve [facility] for medication reconciliation and to assist in a close f/u virtual visit so we can make sure everyone's on the same page. Would suggest visits 2x/week for 4 weeks to start. Given that she's still had a few episodes of periodic CP possible due to stable angina, can consider titrating Imdur but would need to know precisely what she's taking before making this change. Warning sx reviewed with patient. I will also update her CBC and BMET today. 2. Essential HTN - much improved from prior visit, so clearly taking a regimen that is controlling this. 3. Hyperlipidemia - recent LDL well controlled. Continue present regimen. (Will clarify at next visit with [facility].) 4. Mitral regurgitation - no significant murmur on exam. She denies any significant dyspnea and has no signs of CHF on exam. Will continue to follow clinically for now but consider f/u echo in the future.  Disposition: F/u with APP within 1 week  with virtual visit, preferably coordinated with [facility].  Medication Adjustments/Labs and Tests Ordered: Current medicines are reviewed at length with the patient today.  Concerns regarding medicines are outlined above. Medication changes, Labs and Tests ordered today are summarized above and listed in the Patient Instructions accessible in Encounters.   Signed, Charlie Pitter, PA-C  09/02/2019 2:43 PM    [facility] [place], [place], [place]  [address] Phone: [phone]; Fax: [phone]

## 2019-09-02 NOTE — Telephone Encounter (Signed)
[place] Visit Initial Request  Date of Request ([place]):  September 02, 2019  Requesting Provider:  Melina Copa, [facility]    Agency Requested:    [facility] Contact:  Glory Buff, NP [address] [place], [place] [address] Phone #:  [phone] Fax #:  [phone]  Patient Demographic Information: Name:  Sherilee Smotherman Age:  85 y.o.   DOB:  11-Oct-1934  MRN:  [id number]   Address:   [address] [place] [address]   Phone Numbers:   Home Phone [phone]  Mobile [phone]     Emergency Contact Information on File:   Contact Information    Name Relation Home Work Mobile   [place] Son [phone]     tran, Meredeth Ide Niece   [phone]   Ramond Dial [phone]        The above family members may be contacted for information on this patient (review DPR on file):  Yes    Patient Clinical Information:  Primary Care Provider:  Isaac Bliss, Rayford Halsted, MD  Primary Cardiologist:  [name], MD  Primary Electrophysiologist:  None   Past Medical Hx: Ms. Demetrius  has a past medical history of At risk for medication noncompliance, CAD (coronary artery disease), Diabetes mellitus, type 2 ([place]), History of cervical cancer, Hyperlipidemia, Hypertension, Moderate mitral regurgitation, Noncompliance with medication treatment due to underuse of medication, Raynaud's phenomenon, and TN (trigeminal neuralgia).   Allergies: She is allergic to morphine and related.   Medications: Current Outpatient Medications on File Prior to Visit  Medication Sig  . acetaminophen (TYLENOL) 500 MG tablet Take 500 mg by mouth 2 (two) times daily as needed for moderate pain.  [name] amLODipine (NORVASC) 10 MG tablet Take 1 tablet (10 mg total) by mouth daily.  [name] aspirin EC 81 MG tablet Take 81 mg by mouth at bedtime.  [name] atorvastatin (LIPITOR) 80 MG tablet Take 1 tablet (80 mg total) by mouth daily.  . Blood  Glucose Monitoring Suppl (ONE TOUCH ULTRA MINI) w/Device KIT USE TO CHECK BLOOD SUGAR DAILY AND AS NEEDED  . carboxymethylcellulose (REFRESH PLUS) 0.5 % SOLN Place 1 drop into both eyes 2 (two) times daily.  . diclofenac sodium (VOLTAREN) 1 % GEL Apply 4 g topically 4 (four) times daily. For joint pain .  . ezetimibe (ZETIA) 10 MG tablet Take 1 tablet (10 mg total) by mouth daily.  [name] glucose blood (ACCU-CHEK GUIDE) test strip Use as instructed  . hydrALAZINE (APRESOLINE) 25 MG tablet Take 1 tablet (25 mg total) by mouth 2 (two) times daily.  [name] Ethyl 1 g CAPS Take 2 capsules (2 g total) by mouth 2 (two) times a day.  . isosorbide mononitrate (IMDUR) 30 MG 24 hr tablet Take 1 tablet (30 mg total) by mouth daily.  . Lancets (ONETOUCH ULTRASOFT) lancets Once daily or PRN, Dx E11.9  . lisinopril (PRINIVIL,ZESTRIL) 20 MG tablet Take 1 tablet (20 mg total) by mouth daily.  . meclizine (ANTIVERT) 25 MG tablet TAKE 1 TABLET BY MOUTH THREE TIMES DAILY AS NEEDED FOR DIZZINESS  . metFORMIN (GLUCOPHAGE) 500 MG tablet Take 1 tablet (500 mg total) by mouth 2 (two) times daily with a meal.  . Multiple Vitamins-Minerals (WOMENS 50+ MULTI VITAMIN/MIN PO) Take 1 tablet by mouth daily with lunch.   . nitroGLYCERIN (NITROSTAT) 0.4 MG SL tablet Place 1 tablet (0.4 mg total) under the tongue every 5 (five) minutes as needed for chest pain.  [name] omeprazole (PRILOSEC)  20 MG capsule Take 1 capsule (20 mg total) by mouth daily.  . TURMERIC CURCUMIN PO Take 1 capsule by mouth daily with lunch.    No current facility-administered medications on file prior to visit.      Social Hx: She  reports that she has been smoking cigarettes. She has a 90.00 pack-year smoking history. She has never used smokeless tobacco. She reports that she does not drink alcohol or use drugs.    Diagnosis/Reason for Visit:   CAD, HTN; VITALS AND MED RECONCILIATION NEEDED  Services Requested:  Vital Signs (BP, Pulse, O2,  Weight)  Medication Reconciliation  # of Visits Needed/Frequency per Week: 1 ST AVAILABLE: HOWEVER DAYNA DUNN, PAC WOULD LIKE SOMEONE TO BE PRESENT WITH THE PT AT HER VIRTUAL APPT NEXT WED 09/07/19 @ 3:30 PM  2X'S A WEEK FOR 4 WEEKS

## 2019-09-03 LAB — CBC
Hematocrit: 46 % (ref 34.0–46.6)
Hemoglobin: 16.1 g/dL — ABNORMAL HIGH (ref 11.1–15.9)
MCH: 32.8 pg (ref 26.6–33.0)
MCHC: 35 g/dL (ref 31.5–35.7)
MCV: 94 fL (ref 79–97)
Platelets: 273 10*3/uL (ref 150–450)
RBC: 4.91 x10E6/uL (ref 3.77–5.28)
RDW: 12.2 % (ref 11.7–15.4)
WBC: 10.6 10*3/uL (ref 3.4–10.8)

## 2019-09-03 LAB — BASIC METABOLIC PANEL
BUN/Creatinine Ratio: 17 (ref 12–28)
BUN: 19 mg/dL (ref 8–27)
CO2: 22 mmol/L (ref 20–29)
Calcium: 9.8 mg/dL (ref 8.7–10.3)
Chloride: 105 mmol/L (ref 96–106)
Creatinine, Ser: 1.1 mg/dL — ABNORMAL HIGH (ref 0.57–1.00)
GFR calc Af Amer: 54 mL/min/{1.73_m2} — ABNORMAL LOW (ref >59)
GFR calc non Af Amer: 47 mL/min/{1.73_m2} — ABNORMAL LOW (ref >59)
Glucose: 186 mg/dL — ABNORMAL HIGH (ref 65–99)
Potassium: 4.6 mmol/L (ref 3.5–5.2)
Sodium: 142 mmol/L (ref 134–144)

## 2019-09-06 ENCOUNTER — Telehealth: Payer: Self-pay | Admitting: Physician Assistant

## 2019-09-06 ENCOUNTER — Telehealth: Payer: Self-pay

## 2019-09-06 NOTE — Telephone Encounter (Signed)
[place] Visit Initial Request  Date of Request ([place]):  September 06, 2019  Requesting Provider:  Melina Copa, PA-C    Agency Requested:    [facility] Contact:  Glory Buff, NP [address] [place], [place] [address] Phone #:  [phone] Fax #:  [phone]  Patient Demographic Information: Name:  [name] Age:  85 y.o.   DOB:  June 25, 1934  MRN:  [id number]   Address:   [address] [place] [address]   Phone Numbers:   Home Phone [phone]  Mobile [phone]     Emergency Contact Information on File:   Contact Information    Name Relation Home Work Mobile   [place] Son [phone]     tran, Meredeth Ide Niece   [phone]   Ramond Dial [phone]        The above family members may be contacted for information on this patient (review DPR on file):  Yes    Patient Clinical Information:  Primary Care Provider:  Isaac Bliss, Rayford Halsted, MD  Primary Cardiologist:  [name], MD  Primary Electrophysiologist:  None   Past Medical Hx: [name]  has a past medical history of At risk for medication noncompliance, CAD (coronary artery disease), Diabetes mellitus, type 2 ([place]), History of cervical cancer, Hyperlipidemia, Hypertension, Moderate mitral regurgitation, Noncompliance with medication treatment due to underuse of medication, Raynaud's phenomenon, and TN (trigeminal neuralgia).   Allergies: She is allergic to morphine and related.   Medications: Current Outpatient Medications on File Prior to Visit  Medication Sig  . acetaminophen (TYLENOL) 500 MG tablet Take 500 mg by mouth 2 (two) times daily as needed for moderate pain.  [name] amLODipine (NORVASC) 10 MG tablet Take 1 tablet (10 mg total) by mouth daily.  [name] aspirin EC 81 MG tablet Take 81 mg by mouth at bedtime.  [name] atorvastatin (LIPITOR) 80 MG tablet Take 1 tablet (80 mg total) by mouth daily.  . Blood  Glucose Monitoring Suppl (ONE TOUCH ULTRA MINI) w/Device KIT USE TO CHECK BLOOD SUGAR DAILY AND AS NEEDED  . carboxymethylcellulose (REFRESH PLUS) 0.5 % SOLN Place 1 drop into both eyes 2 (two) times daily.  . diclofenac sodium (VOLTAREN) 1 % GEL Apply 4 g topically 4 (four) times daily. For joint pain .  . ezetimibe (ZETIA) 10 MG tablet Take 1 tablet (10 mg total) by mouth daily.  [name] glucose blood (ACCU-CHEK GUIDE) test strip Use as instructed  . hydrALAZINE (APRESOLINE) 25 MG tablet Take 1 tablet (25 mg total) by mouth 2 (two) times daily.  [name] Ethyl 1 g CAPS Take 2 capsules (2 g total) by mouth 2 (two) times a day.  . isosorbide mononitrate (IMDUR) 30 MG 24 hr tablet Take 1 tablet (30 mg total) by mouth daily.  . Lancets (ONETOUCH ULTRASOFT) lancets Once daily or PRN, Dx E11.9  . lisinopril (PRINIVIL,ZESTRIL) 20 MG tablet Take 1 tablet (20 mg total) by mouth daily.  . meclizine (ANTIVERT) 25 MG tablet TAKE 1 TABLET BY MOUTH THREE TIMES DAILY AS NEEDED FOR DIZZINESS  . metFORMIN (GLUCOPHAGE) 500 MG tablet Take 1 tablet (500 mg total) by mouth 2 (two) times daily with a meal.  . Multiple Vitamins-Minerals (WOMENS 50+ MULTI VITAMIN/MIN PO) Take 1 tablet by mouth daily with lunch.   . nitroGLYCERIN (NITROSTAT) 0.4 MG SL tablet Place 1 tablet (0.4 mg total) under the tongue every 5 (five) minutes as needed for chest pain.  [name] omeprazole (PRILOSEC)  20 MG capsule Take 1 capsule (20 mg total) by mouth daily.  . TURMERIC CURCUMIN PO Take 1 capsule by mouth daily with lunch.    No current facility-administered medications on file prior to visit.      Social Hx: She  reports that she has been smoking cigarettes. She has a 90.00 pack-year smoking history. She has never used smokeless tobacco. She reports that she does not drink alcohol or use drugs.    Diagnosis/Reason for Visit:   Vitals / Med Reconciliation   Services Requested:  Vital Signs (BP, Pulse, O2, Weight)  Medication  Reconciliation  # of Visits Needed/Frequency per Week: 1st Available: However, [name] would like someone to be present with pt at her Virtual Appointment next Wednesday, 09/07/2019 at 3:30.

## 2019-09-06 NOTE — Telephone Encounter (Signed)
Glory Buff, NP, had already confirmed for [facility] to be out at time of pt's virtual appointment 09/07/2019.

## 2019-09-06 NOTE — Telephone Encounter (Signed)
Can you touch base with [facility] to find out if they are able to coordinate visit with tomorrow's appointment virtually? If not, will need to r/s to another day that works for them. (Need them onboard to help with med rec for visit; otherwise we will just be repeating prior visits without any progress.) Melina Copa PA-C

## 2019-09-06 NOTE — Progress Notes (Signed)
Virtual Visit via Telephone Note   This visit type was conducted due to national recommendations for restrictions regarding the COVID-19 Pandemic (e.g. social distancing) in an effort to limit this patient's exposure and mitigate transmission in our community.  Due to her co-morbid illnesses, this patient is at least at moderate risk for complications without adequate follow up.  This format is felt to be most appropriate for this patient at this time.  The patient did not have access to video technology/had technical difficulties with video requiring transitioning to audio format only (telephone).  All issues noted in this document were discussed and addressed.  No physical exam could be performed with this format.  Please refer to the patient's chart for her  consent to telehealth for [facility].   Date:  09/07/2019   ID:  [name], DOB [birth year], MRN [id number]  Patient Location: Home Provider Location: Home  PCP:  Isaac Bliss, Rayford Halsted, MD  Cardiologist:  [name], MD  Electrophysiologist:  None   Evaluation Performed:  Follow-Up Visit  Chief Complaint:  F/u concerns on med reconciliation; chest pain  History of Present Illness:    [name] is a 85 y.o. female with [place] female. She has history of DM, HTN, HL, trigeminal neuralgia, Raynaud's disease, moderate MR by echo 03/2018, dilated CBD/abnormal LFTs, abnormal stress test with moderate CAD by cardiac CT, and medication noncompliance with medication reconciliation/health literacy issues. Her son [name] assists by phone but lives out of state.  She speaks fairly good [place]. She traditionally prefers to go without a Optometrist. It should be noted that even when interpreter services were used in the past, she can a vague (but cheerful) historian - having a translator has actually has made things a little bit more difficult to follow in the past.   To recap, she was admitted in 03/2018  with atypical chest pain and elevated blood pressure. She ruled out for MI but LFTs were significantly elevated along with lipase. US showed dilatation of CBD. MR showed no evidence of choledocholithiasis & possible stricuture of distal CBD. ERCP was recommended, though this was difficult to cannulate in the past for similar presentation in 2016. Her LFTs normalized. GI recommended referral to tertiary center for ERCP but given normalization in chemistries and symptoms, the patient elected to follow conservatively. During admission her echo 03/25/18 showed normal LV function, grade 1 DD, moderate MR. Nuclear stress test 04/15/18 was abnormal but could not exclude artifact. I met her in 04/2018 to discuss Dr. Theodosia Blender recommendations to proceed with cardiac CT. She was noticing atypical episodic discomfort but she declined further evaluation. Amlodipine was added for empiric anti-anginal effect and uncontrolled blood pressure.   However, there have been significant issues with medication compliance since that time. When she returned for follow-up 05/2018, she was reporting gait unsteadiness. Blood pressure was 220/90. We had called her pharmacy and apparently she did pick up the amlodipine but had never picked up lisinopril that had been prescribed in the past. She was encouraged to take both. After that, primary care added hydralazine and HCTZ to her regimen. When seen in the office 08/03/18, she said she was taking "everything the doctor prescribes her," but then said she was only taking 1 BP medication. When shown the list she reported she was then taking all 4. We called her pharmacy and her lisinopril, HCTZ and hydralazine were all picked up in July except amlodipine which should have run out by then. Concerns were relayed to  family over med noncompliance who enlisted help of a family friend. Due to atypical episodic chest pain at rest but not exertion, cardiac CT was done 09/21/18 which showed 82nd percentile for  age/sex, moderate CAD in the prox and mid LAD and proximal and mid LCX arteries. CT FFR analysis showed stenosis in the mid portion of a non-dominant LCX artery with distal FFR 0.78. Medical management was recommended unless there were significant symptoms. She went to the emergency room 07/06/2019 with chest discomfort.  She did not stay to be seen by provider but did have an EKG. She followed up with her primary care provider who recommended referral back to cardiology for evaluation. She saw Richardson Dopp 07/26/19 virtually at which time she reported further chest pain so Imdur was added and HCTZ was stopped. Last labs 09/02/19 showed Hgb 16.1, K 4.6, Cr 1.10.  I saw her back in f/u last week and it was a challenging visit because we just didn't know what medicines she actually had at home. Therefore, joint visit with [facility] in the home was arranged for today with son [name] on a 3-way call.  This visit was a very long visit even with having [facility] staff member Jenny Reichmann in the home. It is now very apparent to me that any medication changes made in the past were likely futile as the patient's medication pill box, pill bottles and reported medication list are all vastly different.  Pill bottles located in the home: dry eyes solution, gabapentin, Vascepa, isosorbide, HCTZ, atorvastatin, aspirin Pills identified in her pill box (WITHOUT pill bottle found in the home, identified using online pill identifier), approximately 4 days left: lisinopril 47m, hydralazine 259m metformin 50065mmeclizine 42m21mmeprazole 20mg41me only one in the pill box that also had an associated bottle was atorvastatin 80mg.35m only takes her medicine out of her pill box once per day which means she's taking 2 hydralazine and 2 metformin all at once.  Fortunately, reports she is feeling just fine today. She had brief episode of CP yesterday lasting minutes. It was not provoked by activity or exertion. Happened at random. She  went and took a nap and woke up feeling fine. Otherwise able to go about her day with activity without recurrence. She occasionally works in the garden.  Past Medical History:  Diagnosis Date   At risk for medication noncompliance    CAD (coronary artery disease)    a. borderline disease by CT 08/2018.   Diabetes mellitus, type 2 ([facility])  [place] cancer    Hyperlipidemia    Hypertension    Moderate mitral regurgitation    Noncompliance with medication treatment due to underuse of medication    Raynaud's phenomenon    TN (trigeminal neuralgia)    Past Surgical History:  Procedure Laterality Date   BREAST BIOPSY     BREAST ENHANCEMENT SURGERY     CHOLECYSTECTOMY N/A 02/14/2014   Procedure: LAPAROSCOPIC CHOLECYSTECTOMY;  Surgeon: ArmandRalene Ok Location: [facility] OR;[place]vice: General;  Laterality: N/A;   ERCP N/A 03/08/2015   Procedure: ENDOSCOPIC RETROGRADE CHOLANGIOPANCREATOGRAPHY (ERCP);  Surgeon: MalcolLadene Artist [facility];  Service: Endoscopy;  Laterality: N/A;   MASS EXCISION Right 12/25/2016   Procedure: EXCISION RIGHT SCALP MASS SEBACEOUS CYST;  Surgeon: DouglaCoralie Keens Location: [facility] OR;[place]ice: General;  Laterality: Right;   TOTAL [place] cervical cancer in situ  Current Meds  Medication Sig   acetaminophen (TYLENOL) 500 MG tablet Take 500 mg by mouth 2 (two) times daily as needed for moderate pain.   isosorbide mononitrate (IMDUR) 30 MG 24 hr tablet Take 1 tablet (30 mg total) by mouth daily.   Multiple Vitamins-Minerals (WOMENS 50+ MULTI VITAMIN/MIN PO) Take 1 tablet by mouth daily with lunch.    nitroGLYCERIN (NITROSTAT) 0.4 MG SL tablet Place 1 tablet (0.4 mg total) under the tongue every 5 (five) minutes as needed for chest pain.     Allergies:   Morphine and related   Social History   Tobacco Use   Smoking status: Current Every Day Smoker    Packs/day: 1.50    Years: 60.00      Pack years: 90.00    Types: Cigarettes   Smokeless tobacco: Never Used  Substance Use Topics   Alcohol use: No   Drug use: No     Family Hx: The patient's family history is negative for CAD and Diabetes Mellitus II.  ROS:   Please see the history of present illness.    All other systems reviewed and are negative.   Prior CV studies:    Most recent pertinent cardiac studies are outlined above.  Labs/Other Tests and Data Reviewed:    EKG:  An ECG dated 09/02/19 was personally reviewed today and demonstrated:  NSR 86bpm, LAFB, nonspecific STT Changes similar to prior  Recent Labs: 10/21/2018: TSH 0.87 07/19/2019: ALT 11 09/02/2019: BUN 19; Creatinine, Ser 1.10; Hemoglobin 16.1; Platelets 273; Potassium 4.6; Sodium 142   Recent Lipid Panel Lab Results  Component Value Date/Time   CHOL 128 07/19/2019 08:33 AM   TRIG 150 (H) 07/19/2019 08:33 AM   HDL 58 07/19/2019 08:33 AM   CHOLHDL 2.2 07/19/2019 08:33 AM   CHOLHDL 2.7 03/24/2018 07:30 PM   LDLCALC 40 07/19/2019 08:33 AM   LDLDIRECT 83.0 12/21/2014 10:40 AM    Wt Readings from Last 3 Encounters:  09/07/19 97 lb (44 kg)  09/02/19 98 lb (44.5 kg)  07/26/19 98 lb (44.5 kg)     Objective:    Vital Signs:  BP (!) 152/64    Pulse 78    Temp (!) 97.4 F (36.3 C)    Ht '4\' 9"'  (1.448 m)    Wt 97 lb (44 kg)    SpO2 97%    BMI 20.99 kg/m    VS reviewed. General - pleasant Asian F in no acute distress Pulm - No labored breathing, no coughing during visit, no audible wheezing, speaking in full sentences Neuro - A+Ox3, no slurred speech, answers questions appropriately Psych - Pleasant affect    ASSESSMENT & PLAN:    1. History of noncompliance, presenting hazards to health - I spent 1 hour 15 minutes with Jenny Reichmann, son [name], and patient Gesselle on the phone during this visit trying to comb through what she is actually taking and what she has available to her. Ultimately we all agreed that it is best that we pare down Odie's  regimen as much as possible to bare minimum to ensure safety. We can always work our way back up if needed, but it's very clear that she is at risk for polypharmacy unless her regimen is simplified as much as possible. Jenny Reichmann was able to swap out her pill box for one that has AM/PM on it, since she takes metformin BID. Ultimately we came up with the following regimen to start fresh:   aspirin 59m daily (available in  the home)  lisinopril 71m daily (4 days left, refill sent in)  metformin 5061mBID (4 days left, refill sent in)  atorvastatin 8068maily (available in the home)  isosorbide 57m67mily (available in the home)  amlodipine 5mg 28mly (no amlodipine located - this was sent in as a new rx to her pharmacy)  NTG SL PRN (available in the home)  dry eyes BID (available in the home)  We elected to discontinue the medications that she does not absolutely medically need at present time which include:  hydralazine (would prefer to utilize anti-anginals for blood pressure control instead first - the BID dosing has also been an issue for her and she has recently been taking it all at once, one time per day)  meclizine (did not make much of a difference in the past)  omeprazole (no recent GERD symptoms)  Vascepa (as much as I would like to achieve strict control over her lipids, she may not have even been taking her statin as prescribed consistently. I worry about complexity of regimen and want to nail down the bare minimum - this can be revisited at a later time. CindyJenny Reichmann raised concern over size of pills. She previously had Zetia on her med list as well but this is not in her pill box and not in the home.)  turmeric  Hydrochlorothiazide ([place]pped at [place])  gabapentin (was only taking sporadically anyway, potential for overdose)  acetaminophen  diclofenac  Based on what she had available in the home, we sent in refills on her lisinopril, metformin and the new  prescription for amlodipine 5mg. 48mcalled her pharmacy to notify them she was coming this evening to pick them up. Her son Jim saClair Gullinghe will confirm with his mom when she gets to the pharmacy and walk her through putting the amlodipine in the box. Cindy Jenny Reichmannble to affix the new pillbox for everything for the next 4 days except the amlodipine as above. Cindy Jenny Reichmannates that her colleague DebbieJackelyn Polingming out on Friday with [facility]. Will arrange a tag-team virtual appointment on Friday 10/16 to reconfirm that she is tolerating this regimen and help her restock. Given the extensive length of today's appointment I spoke with administrator CrystaLillia Paulsquest that Friday's appointment be blocked off for 1 hour. At last OV, I requested twice weekly visits with home health. We might need to end up scheduling her for virtual visits at those times until we can ensure medication compliance and safety. I also informed Jim myClair Gullingncerns about long term medication compliance as they may need to explore other avenues such as enlisting help of a local friend/family to oversee her care or consider options like ALF. I will cc this note to primary care for review as well.  2. CAD with chest pain - remains challenging to tell if this is angina or not. I am hopeful she will see improvement once she is on a regimen to address potential angina. She is not presently a great candidate for catheterization given compliance issues. Warning symptoms have been reviewed with her. She is chest pian free today.  3. Essential HTN - follow with medication changes above.  4. Hyperlipidemia - can recheck down the road once consistently taking statin. We can consider re-adding Vascepa or Zetia down the road but I feel the risk of polypharmacy confusion outweighs the benefit at present time.  5. Mitral regurgitation - no recent murmur on examination. No significant  dyspnea or HF symptoms. Would continue to follow clinically for now, can  consider f/u echo in the future.  COVID-19 Education: The signs and symptoms of COVID-19 were discussed with the patient and how to seek care for testing (follow up with PCP or arrange E-visit).  The importance of social distancing was discussed today.  Time:   Today, I have spent 1 hour 15 minutes minutes with the patient with telehealth technology discussing the above problems.     Medication Adjustments/Labs and Tests Ordered: Current medicines are reviewed at length with the patient today.  Concerns regarding medicines are outlined above.   Disposition:  Follow up on Friday 09/10/19 with Richardson Dopp at 11:15pm, timeslot blocked for 1 hour. I did not have any availability that day.  Signed, Charlie Pitter, PA-C  09/07/2019 3:57 PM     [facility]

## 2019-09-07 ENCOUNTER — Telehealth (INDEPENDENT_AMBULATORY_CARE_PROVIDER_SITE_OTHER): Payer: PPO | Admitting: Physician Assistant

## 2019-09-07 ENCOUNTER — Encounter: Payer: Self-pay | Admitting: Physician Assistant

## 2019-09-07 ENCOUNTER — Other Ambulatory Visit: Payer: Self-pay

## 2019-09-07 VITALS — BP 152/64 | HR 78 | Temp 97.4°F | Ht <= 58 in | Wt 97.0 lb

## 2019-09-07 DIAGNOSIS — I10 Essential (primary) hypertension: Secondary | ICD-10-CM

## 2019-09-07 DIAGNOSIS — Z91199 Patient's noncompliance with other medical treatment and regimen due to unspecified reason: Secondary | ICD-10-CM

## 2019-09-07 DIAGNOSIS — I340 Nonrheumatic mitral (valve) insufficiency: Secondary | ICD-10-CM

## 2019-09-07 DIAGNOSIS — Z9119 Patient's noncompliance with other medical treatment and regimen: Secondary | ICD-10-CM

## 2019-09-07 DIAGNOSIS — E785 Hyperlipidemia, unspecified: Secondary | ICD-10-CM

## 2019-09-07 DIAGNOSIS — I25118 Atherosclerotic heart disease of native coronary artery with other forms of angina pectoris: Secondary | ICD-10-CM

## 2019-09-07 MED ORDER — AMLODIPINE BESYLATE 5 MG PO TABS: 5.0000 mg | ORAL_TABLET | Freq: Every day | ORAL | 3 refills | Status: DC

## 2019-09-07 MED ORDER — LISINOPRIL 20 MG PO TABS: 20.0000 mg | ORAL_TABLET | Freq: Every day | ORAL | 3 refills | Status: DC

## 2019-09-07 MED ORDER — METFORMIN HCL 500 MG PO TABS: 500.0000 mg | ORAL_TABLET | Freq: Two times a day (BID) | ORAL | 0 refills | Status: DC

## 2019-09-07 NOTE — Patient Instructions (Addendum)
We have cleaned up your medicine list extensively.  The only medications you should be taking presently are the following:  - aspirin 81mg  1 tablet once a day - lisinopril 20mg  1 tablet once a day - metformin 500mg  1 tablet twice a day - atorvastatin 80mg  1 tablet once a day - isosorbide 30mg  1 tablet once a day - amlodipine 5mg  1 tablet once a day - multivitamin 1 tablet once a day - nitroglycerin 1 tablet as needed for chest pain every 5 minutes up to 3 doses - dry eyes 1 drop each eye twice a day for dry eyes  We have removed the following medicines from your medicine list:  - hydralazine - meclizine - omeprazole - Vascepa (icosapent ethyl) - turmeric - hydrochlorothiazide - gabapentin - acetaminophen - diclofenac  You have been scheduled to follow-up with Richardson Dopp, PA-C, via virtual phone call, Friday 09/10/2019 at 11:15.  [facility] will be there Jackelyn Poling) to assist you.

## 2019-09-09 ENCOUNTER — Telehealth: Payer: Self-pay

## 2019-09-09 NOTE — Telephone Encounter (Signed)
Called pt to go over meds and pt informed me that her nurse will be there with her tomorrow for appt because she's not sure what she is taking.

## 2019-09-09 NOTE — Progress Notes (Signed)
Virtual Visit via Telephone Note   This visit type was conducted due to national recommendations for restrictions regarding the COVID-19 Pandemic (e.g. social distancing) in an effort to limit this patient's exposure and mitigate transmission in our community.  Due to her co-morbid illnesses, this patient is at least at moderate risk for complications without adequate follow up.  This format is felt to be most appropriate for this patient at this time.  The patient did not have access to video technology/had technical difficulties with video requiring transitioning to audio format only (telephone).  All issues noted in this document were discussed and addressed.  No physical exam could be performed with this format.  Please refer to the patient's chart for her  consent to telehealth for [facility].   Date:  09/10/2019   ID:  [name], DOB Nov 15, 1934, MRN [id number]  Patient Location: Home Provider Location: Home  PCP:  Isaac Bliss, Rayford Halsted, MD  Cardiologist:  [name], MD / Melina Copa, PA-C  Electrophysiologist:  None   Evaluation Performed:  Follow-Up Visit  Chief Complaint:  Med adherence, coronary artery disease   History of Present Illness:    [name] is a 85 y.o. female with:  Coronary artery disease ? Myoview 5/19 with anteroseptal and lateral ischemia ? CTA 10/19: Coronary calcium 662, moderate CAD in the LAD and LCx with borderline significance in the LCx by [place] >> medical therapy unless significant symptoms  Mitral regurgitation ? Moderate by echo 5/19  Carotid artery disease  Diabetes mellitus  Hypertension  Hyperlipidemia  Raynaud's disease  History of elevated LFTs with dilated common bile duct  Tobacco abuse  Trigeminal neuralgia  Medication non adherence  [name] was admitted in May 2019 with chest pain and elevated LFTs.  Please see the detailed notes by Melina Copa, PA-C from 10/01/18 for  complete details.  Briefly, her anatomy did not allow for ERCP and it was recommended that she have this performed at a tertiary center.  She preferred conservative management.  For her chest pain, a nuclear stress test demonstrated probable anteroseptal and lateral ischemia.  Follow-up coronary CTA was recommended.  The patient held off on this until October 2019.  This demonstrated mod dz in the LAD and LCx.  By [facility], she had borderline significant lesion in the mid LCx.  Recommendation was to manage this medically unless she has severe symptoms.  She has had difficulty adhering to medications in the past.  She was last seen via Telemedicine with Melina Copa, PA-C 3 days ago.  Dayna and the RN with [facility] carefully went through her medications and discovered that she was not taking her medications correctly and changed her regimen to include only ASA, Lisinopril, Metformin, Atorvastatin, Isosorbide, Amlodipine, prn NTG, dry eyes drops.    Today, she is seen with the assistance of the nurse from [facility] Vanita Ingles).  The nurse was able to verify that the patient does have the medications listed at her home.  She does not have aspirin.  The patient notes that she has not taken any medications in the last 2 days.  She does note occasional chest pressure that is relieved by nitroglycerin.  She has not had shortness of breath, syncope, orthopnea or leg swelling.   Past Medical History:  Diagnosis Date   At risk for medication noncompliance    CAD (coronary artery disease)    a. borderline disease by CT 08/2018.   Diabetes mellitus, type 2 ([place])  History of cervical cancer    Hyperlipidemia    Hypertension    Moderate mitral regurgitation    Noncompliance with medication treatment due to underuse of medication    Raynaud's phenomenon    TN (trigeminal neuralgia)    Past Surgical History:  Procedure Laterality Date   BREAST BIOPSY     BREAST ENHANCEMENT SURGERY      CHOLECYSTECTOMY N/A 02/14/2014   Procedure: LAPAROSCOPIC CHOLECYSTECTOMY;  Surgeon: Ralene Ok, MD;  Location: [place];  Service: General;  Laterality: N/A;   ERCP N/A 03/08/2015   Procedure: ENDOSCOPIC RETROGRADE CHOLANGIOPANCREATOGRAPHY (ERCP);  Surgeon: Ladene Artist, MD;  Location: [facility];  Service: Endoscopy;  Laterality: N/A;   MASS EXCISION Right 12/25/2016   Procedure: EXCISION RIGHT SCALP MASS SEBACEOUS CYST;  Surgeon: Coralie Keens, MD;  Location: [place];  Service: General;  Laterality: Right;   [place]   for cervical cancer in situ     Current Meds  Medication Sig   amLODipine (NORVASC) 5 MG tablet Take 1 tablet (5 mg total) by mouth daily.   atorvastatin (LIPITOR) 80 MG tablet Take 80 mg by mouth daily.   Blood Glucose Monitoring Suppl (ONE TOUCH ULTRA MINI) w/Device KIT USE TO CHECK BLOOD SUGAR DAILY AND AS NEEDED   carboxymethylcellulose (REFRESH PLUS) 0.5 % SOLN Place 1 drop into both eyes 2 (two) times daily.   glucose blood (ACCU-CHEK GUIDE) test strip Use as instructed   isosorbide mononitrate (IMDUR) 30 MG 24 hr tablet Take 1 tablet (30 mg total) by mouth daily.   Lancets (ONETOUCH ULTRASOFT) lancets Once daily or PRN, Dx E11.9   lisinopril (ZESTRIL) 20 MG tablet Take 1 tablet (20 mg total) by mouth daily.   metFORMIN (GLUCOPHAGE) 500 MG tablet Take 1 tablet (500 mg total) by mouth 2 (two) times daily with a meal.   Multiple Vitamins-Minerals (WOMENS 50+ MULTI VITAMIN/MIN PO) Take 1 tablet by mouth daily with lunch.    nitroGLYCERIN (NITROSTAT) 0.4 MG SL tablet Place 1 tablet (0.4 mg total) under the tongue every 5 (five) minutes as needed for chest pain.     Allergies:   Morphine and related   Social History   Tobacco Use   Smoking status: Current Every Day Smoker    Packs/day: 1.50    Years: 60.00    Pack years: 90.00    Types: Cigarettes   Smokeless tobacco: Never Used  Substance Use Topics   Alcohol use:  No   Drug use: No     Family Hx: The patient's family history is negative for CAD and Diabetes Mellitus II.  ROS:   Please see the history of present illness.    All other systems reviewed and are negative.   Prior CV studies:   The following studies were reviewed today:  Coronary CTA 09/21/2018 IMPRESSION: 1. Coronary calcium score of 662. This was 68 percentile for age and sex matched control.  2. Normal coronary origin with right dominance.  3. Moderate CAD in the proximal and mid LAD and proximal and mid LCX arteries. Additional analysis with CT FFR will be submitted.  4. Severe mitral annular calcifications. FFR 1. Left Main: No significant stenosis.  2. LAD: Proximal: 0.92, distal: 0.80. 3. D1: 0.81. 4. LCX: Proximal: 0.98, distal: 0.78. 5. RCA: Proximal: 0.98, distal: 0.85. 6. PDA: 0.81.  IMPRESSION: 1. CT FFR analysis showed stenosis in the mid portion of a non-dominant LCX artery. A medical management would be recommended unless there are  significant symptoms.  Carotid US 07/31/2018 Bilateral ICA 1-39  Myoview 04/15/2018 Large size, moderate severity partially reversible anteroseptal and septal perfusion defect and a medium size, moderate severity partially reversible perfusion defect of the lateral wall. These findings are suggestive of ischemia, but artifact cannot be excluded. Clinical correlation is recommended and additional testing may be necessary.  Echocardiogram 03/25/2018 Mild concentric LVH, EF normal, normal wall motion, grade 1 diastolic dysfunction, MAC, moderate MR, mild LAE, PASP 32   Labs/Other Tests and Data Reviewed:    EKG:  No ECG reviewed.  Recent Labs: 10/21/2018: TSH 0.87 07/19/2019: ALT 11 09/02/2019: BUN 19; Creatinine, Ser 1.10; Hemoglobin 16.1; Platelets 273; Potassium 4.6; Sodium 142   Recent Lipid Panel Lab Results  Component Value Date/Time   CHOL 128 07/19/2019 08:33 AM   TRIG 150 (H) 07/19/2019 08:33 AM    HDL 58 07/19/2019 08:33 AM   CHOLHDL 2.2 07/19/2019 08:33 AM   CHOLHDL 2.7 03/24/2018 07:30 PM   LDLCALC 40 07/19/2019 08:33 AM   LDLDIRECT 83.0 12/21/2014 10:40 AM    Wt Readings from Last 3 Encounters:  09/10/19 95 lb (43.1 kg)  09/10/19 95 lb (43.1 kg)  09/07/19 97 lb (44 kg)     Objective:    Vital Signs:  BP (!) 152/88    Pulse 72    Ht 5' (1.524 m)    Wt 95 lb (43.1 kg)    BMI 18.55 kg/m    VITAL SIGNS:  reviewed GEN:  no acute distress RESPIRATORY:  No labored breathing PSYCH:  Normal mood  ASSESSMENT & PLAN:    1. History of noncompliance with medical treatment, presenting hazards to health As noted, the nurse from [facility] did help with the patient's evaluation today.  She was able to verify that the patient does have the medications as listed in her home.  However, she has not taken these medications yet.  She does have a pillbox.  Of note, the patient's native language is [place].  She is not able to read [place] well at all.  She is literate.  I will ask our social worker at the heart failure clinic to reach out to the patient to see if there are any avenues that she can help with in terms of the patient's barriers with social determinants of health.  I have specifically asked her to see if we can get the patient set up with a pill pack from her pharmacy.  The instructions would need to be in [place].  This would hopefully help with maintaining adherence.  I have asked the patient to set an alarm on her phone to remind her to take her medications.  The nurse from [facility] will help the patient put her pills in pill boxes.  She will come up with some strategies to help her remember her medications and write the instructions in [place].  She is scheduled to come back out to her house twice a week for the next 4 weeks.  2. Coronary artery disease involving native coronary artery of native heart with angina pectoris (HCC) Moderate CAD by coronary CTA 10/19.   There was borderline significant stenosis in the mid LCx.  Medical therapy has been recommended unless she has worsening symptoms.  She currently notes frequent symptoms of angina.  These are relieved by nitroglycerin.  Unfortunately, she is not taking any of the medications to help prevent anginal symptoms.  I have explained this to the patient today that she needs to take  those medications to alleviate her symptoms as well as to control her blood pressure.  The patient notes she will go to the pharmacy to pick up aspirin.  We will continue to work on medication adherence as outlined above.  I will arrange follow-up with either Dayna or me in the next 1 to 2 weeks to follow-up on her symptoms.  3. Essential hypertension Blood pressure is uncontrolled.  As noted, the nurse will try to help set up a system so that she can start taking her medications as planned.  4. Tobacco abuse I have recommended cessation.  She has been smoking for 60 years and tells me that she does not want to quit.    Time:   Today, I have spent 16 minutes with the patient with telehealth technology discussing the above problems.    Medication Adjustments/Labs and Tests Ordered: Current medicines are reviewed at length with the patient today.  Concerns regarding medicines are outlined above.   Tests Ordered: No orders of the defined types were placed in this encounter.   Medication Changes: No orders of the defined types were placed in this encounter.   Follow Up:  Either In Person or Virtual in 2 week(s)  Signed, Richardson Dopp, PA-C  09/10/2019 12:32 PM    [place]

## 2019-09-10 ENCOUNTER — Telehealth (INDEPENDENT_AMBULATORY_CARE_PROVIDER_SITE_OTHER): Payer: PPO | Admitting: Physician Assistant

## 2019-09-10 ENCOUNTER — Telehealth: Payer: Self-pay | Admitting: *Deleted

## 2019-09-10 ENCOUNTER — Other Ambulatory Visit: Payer: Self-pay

## 2019-09-10 VITALS — BP 152/88 | HR 72 | Ht 60.0 in | Wt 95.0 lb

## 2019-09-10 DIAGNOSIS — Z720 Tobacco use: Secondary | ICD-10-CM

## 2019-09-10 DIAGNOSIS — Z9119 Patient's noncompliance with other medical treatment and regimen: Secondary | ICD-10-CM

## 2019-09-10 DIAGNOSIS — I25119 Atherosclerotic heart disease of native coronary artery with unspecified angina pectoris: Secondary | ICD-10-CM

## 2019-09-10 DIAGNOSIS — Z91199 Patient's noncompliance with other medical treatment and regimen due to unspecified reason: Secondary | ICD-10-CM

## 2019-09-10 DIAGNOSIS — I10 Essential (primary) hypertension: Secondary | ICD-10-CM

## 2019-09-10 NOTE — Telephone Encounter (Signed)
I have left a message for Glory Buff, NP in regards to pt. Per Richardson Dopp, PAC he would like to confirm that Home Visit Request for visits to be seen 2 times a week for 4 weeks. Looks like Home Visit Request was placed by Melina Copa, [facility] 09/06/19.

## 2019-09-10 NOTE — Patient Instructions (Signed)
Medication Instructions:  Your physician recommends that you continue on your current medications as directed. Please refer to the Current Medication list given to you today.  *If you need a refill on your cardiac medications before your next appointment, please call your pharmacy*  Lab Work: none If you have labs (blood work) drawn today and your tests are completely normal, you will receive your results only by: [name] MyChart Message (if you have MyChart) OR . A paper copy in the mail If you have any lab test that is abnormal or we need to change your treatment, we will call you to review the results.  Testing/Procedures: none  Follow-Up:  Your physician recommends that you schedule a follow-up appointment in: 1-2 weeks. Scheduled as a phone visit with Richardson Dopp, [place] on October 26,2020 at 11:45  Other Instructions The social worker will contact you to help assist with your medications and other possible needs. The nurse will continue to come out to your home twice per week for 4 weeks.

## 2019-09-10 NOTE — Telephone Encounter (Signed)
-----   Message from Liliane Shi, [place] sent at 09/10/2019  1:25 PM EDT ----- Regarding: RE: Telehealth visit today Can you just confirm that they are going to see her 2 times a week for 4 weeks? I think Dayna ordered already but want to make sure.  Thanks! ----- Message ----- From: Michae Kava, CMA Sent: 09/10/2019   1:20 PM EDT To: Liliane Shi, PA-C Subject: RE: Telehealth visit today                     Hi Scott,  Can you help me a minute. I am a little confused. Am I putting in new Home Visit orders or am I just sending the orders that were put in 09/06/19?   Thank you  Arbie Cookey  ----- Message ----- From: Thompson Grayer, RN Sent: 09/10/2019   1:05 PM EDT To: Rebeca Alert [facility] Subject: FW: Telehealth visit today                     See message below from [place] about home health.  Debbie, RN was in the home today and she was making a note of Scott's request for ongoing nurse visits but she asked that we contact [facility] also with this request. Thanks ----- Message ----- From: Sharmon Revere Sent: 09/10/2019  12:26 PM EDT To: Thompson Grayer, RN Subject: Telehealth visit today                         Meds: No changes  Labs: None   Tests:None   Other: I have sent a staff message to Raquel Sarna, MSW with the [facility] to reach out to the patient to help with her needs.  I specifically asked her to try to get her set up with pill packets from her pharmacy.  But, I hope Kennyth Lose can help set her up with other things that would provide some benefit.  The RN Jackelyn Poling noted that she thinks she is supposed to be going out twice a week for 4 weeks. I am not sure how to confirm that but can you make sure that she is set up for that?  She needs it.  Follow up: Dayna in 1-2 weeks (she can see me if Dayna has nothing) Either In Person or [facility], PA-C    09/10/2019 12:26 PM

## 2019-09-10 NOTE — Progress Notes (Signed)
[name]      DOB: 1934-04-07  Purpose of Visit: Med reconcillation/management Ordering provider: Richardson Dopp, PA  Medications: Is the patient taking all medications listed on MAR from Epic? No.  There were many other pill bottles in various places throughout her kitchen. Reviewed what patient should be taking w/[facility] RN, Fraser Wright.  Updated and appropriate pill bottles labeled "AM", "PM", or "AM & PM" w/understanding stated by [name] then meds filled into pill box by patient w/stand-by assistance.     Other medication bottles w/meds that were not ordered or d/c at this time were put into the cabinet and [name] understands to not take those for now.   The medications she should be taking per provider are now kept in a basket w/the filled pill box on top of them.  She states she will take her AM meds around breakfast and her phone has an alarm set for 4pm for her to take her PM med  [place], [place] will consult Tara Lose, LCSW to call [name] about having medications packaged in bubble packs and delivered, which [name] said would be much easier.   We also reviewed the use of her glucometer as well as her BP wrist cuff.  She has been given a BP chart to document on each morning and will also document her CBG on it as well.  She doesn't think she has a scale to weight herself with.  Agreed a good time would be after awaking, using the restroom but before drinking her coffee or eating.  She states she usually doesn't eat breakfast but will eat lunch and dinner, though the portions she eats has decreased.  States she eats rice w/each meal she has.    Plan is to have a f/u virtual or phone visit w/Scott Kathlen Mody on Mon., 10/26 at 1145am.  [name] is aware.    Vanita Ingles, RN 09/10/19

## 2019-09-13 ENCOUNTER — Telehealth: Payer: Self-pay | Admitting: Licensed Clinical Social Worker

## 2019-09-13 NOTE — Telephone Encounter (Signed)
Duplicate note opened in error  

## 2019-09-13 NOTE — Telephone Encounter (Signed)
CSW received referral to assist patient with medication compliance. CSW spoke with patient who states she is doing much better. She reports she has all her medications and she has taken daily since RN visit on Friday. Patient and CSW discussed blister packs going forward and the benefits with compliance and patient in agreement.  Patient denies any other SDOH needs at this time. CSW will discussed further with Vanita Ingles, RN making home visits and coordinate obtaining blister packs. Patient grateful for the call. [name], [place], [place]

## 2019-09-13 NOTE — Telephone Encounter (Signed)
CSW contacted patient's current pharmacy [facility]  and inquired about blister packs and was told they do not provide. CSW contacted [facility] regarding blister packs with instructions in [place] and confirmed that [facility] will provide as well as deliver. CSW shared this information with Vanita Ingles, RN although patient just had all her medications filled so we will wait for transfer to [facility] until next refill. In the meantime, CSW ordered empty blister packs for Vanita Ingles, RN to prefill patient's current medications at home visit next week. CSW will continue to monitor and support patient as needed for medication compliance. Raquel Sarna, [place], [place]

## 2019-09-19 NOTE — Progress Notes (Signed)
Home visit on behalf of [facility] to see [name] for a medication check and vitals.  It looked as though she had filled her med box correctly since the last visit, however she'd left a single pill (appeared to be asa 81mg ) in one of the "am" spaces.    She hadn't been taking or recording her BP or CBG since the last home visit.  She demonstrated use of the wrist BP cuff but used it on her upper arm = 122/50, 83.  When BP was taken manually = 142/60, 82.  Used the wrist BP cuff on her wrist = 140/61, 82.  Encouraged her to take her BP on her wrist since it was for the wrist and the reading on the wrist was more consistent with the manual BP check.    When asked she stated she was still having chest heaviness on occasion, last time being Thursday (yesterday) and took 0.4mg  Nitro SL, lied down to rest and it resolved.    Her CBG = 346.  Has been taking her metformin 500mg  BID.  States she doesn't eat very much as she doesn't have much of an appetite but had been drinking a "Koolaid" appearing sweet drink throughout the day.  Encouraged her to try and eat meals w/protein and to try and cut back on the sweet drinks.  She stated she would.    She needs reinforcement with filling medications into her med box as she gets confused as to which meds to take during the AM and PM.  The bottles are clearly labeled AM and PM on the lids and she stated she understands.  She also needs reinforcing w/tracking her BP and CBG, which was done.    Will revisit on Monday 09/20/19

## 2019-09-19 NOTE — Progress Notes (Signed)
Virtual Visit via Telephone Note   This visit type was conducted due to national recommendations for restrictions regarding the COVID-19 Pandemic (e.g. social distancing) in an effort to limit this patient's exposure and mitigate transmission in our community.  Due to her co-morbid illnesses, this patient is at least at moderate risk for complications without adequate follow up.  This format is felt to be most appropriate for this patient at this time.  The patient did not have access to video technology/had technical difficulties with video requiring transitioning to audio format only (telephone).  All issues noted in this document were discussed and addressed.  No physical exam could be performed with this format.  Please refer to the patient's chart for her  consent to telehealth for [facility].   Date:  09/20/2019   ID:  [name], DOB [birth year], MRN [id number]  Patient Location: Home Provider Location: Home  PCP:  Isaac Bliss, Rayford Halsted, MD  Cardiologist:  [name], MD / Melina Copa, PA-C  Electrophysiologist:  None   Evaluation Performed:  Follow-Up Visit  Chief Complaint: CAD, medication adherence  History of Present Illness:    [name] is a 85 y.o. female with    Coronary artery disease ? Myoview 5/19 with anteroseptal and lateral ischemia ? CTA 10/19: Coronary calcium 662, moderate CAD in the LAD and LCx with borderline significance in the LCx by [facility] >> medical therapy unless significant symptoms  Mitral regurgitation ? Moderate by echo 5/19  Carotid artery disease  Diabetes mellitus  Hypertension  Hyperlipidemia  Raynaud's disease  History of elevated LFTs with dilated common bile duct  Tobacco abuse  Trigeminal neuralgia  Medication non adherence  [name] was admitted in May 2019 with chest pain and elevated LFTs.  Please see the detailed notes by Melina Copa, PA-C from 10/01/18 for complete  details.  Briefly, her anatomy did not allow for ERCP and it was recommended that she have this performed at a tertiary center.  She preferred conservative management.  For her chest pain, a nuclear stress test demonstrated probable anteroseptal and lateral ischemia.  Follow-up coronary CTA was recommended.  The patient held off on this until October 2019.  This demonstrated mod dz in the LAD and LCx.  By [facility], she had borderline significant lesion in the mid LCx.  Recommendation was to manage this medically unless she has severe symptoms.  She has had difficulty adhering to medications in the past.  She was last seen 09/10/2019 via Telemedicine.  The RN with [facility] helped with the appt.  We confirmed that she had all of her medications.  I have asked for social work to help with getting her medications delivered in pill packs.  The patient did not some anginal symptoms at her last visit and we discussed the importance of taking her medications to help control her symptoms.    She is seen again today with the help of the nurse from [place].  Her son also joined on the call.  Since last seen, the patient notes that her chest symptoms are improved.  She was having a couple of episodes of angina per week.  She has not had chest discomfort in several days.  She did have one episode last week that improved with nitroglycerin.  She has not had significant shortness of breath.  She has not had syncope.  Recently, her sugar was over 400.  [facility] is also come out to her house and her  Metformin has been adjusted.  I have asked her son to arrange follow-up with primary care for diabetes.  The patient does not have symptoms concerning for COVID-19 infection (fever, chills, cough, or new shortness of breath).    Past Medical History:  Diagnosis Date  . At risk for medication noncompliance   . CAD (coronary artery disease)    a. borderline disease by CT 08/2018.  . Diabetes mellitus, type 2  ([place])   . History of cervical cancer   . Hyperlipidemia   . Hypertension   . Moderate mitral regurgitation   . Noncompliance with medication treatment due to underuse of medication   . Raynaud's phenomenon   . TN (trigeminal neuralgia)    Past Surgical History:  Procedure Laterality Date  . BREAST BIOPSY    . BREAST ENHANCEMENT SURGERY    . CHOLECYSTECTOMY N/A 02/14/2014   Procedure: LAPAROSCOPIC CHOLECYSTECTOMY;  Surgeon: Ralene Ok, MD;  Location: [facility];  Service: General;  Laterality: N/A;  . ERCP N/A 03/08/2015   Procedure: ENDOSCOPIC RETROGRADE CHOLANGIOPANCREATOGRAPHY (ERCP);  Surgeon: Ladene Artist, MD;  Location: [facility];  Service: Endoscopy;  Laterality: N/A;  . MASS EXCISION Right 12/25/2016   Procedure: EXCISION RIGHT SCALP MASS SEBACEOUS CYST;  Surgeon: Coralie Keens, MD;  Location: [facility];  Service: General;  Laterality: Right;  . TOTAL ABDOMINAL HYSTERECTOMY  1963   for cervical cancer in situ     Current Meds  Medication Sig  . amLODipine (NORVASC) 5 MG tablet Take 1 tablet (5 mg total) by mouth daily.  [name] aspirin EC 81 MG tablet Take 1 tablet (81 mg total) by mouth at bedtime.  [name] atorvastatin (LIPITOR) 80 MG tablet Take 1 tablet (80 mg total) by mouth daily.  . Blood Glucose Monitoring Suppl (ONE TOUCH ULTRA MINI) w/Device KIT USE TO CHECK BLOOD SUGAR DAILY AND AS NEEDED  . carboxymethylcellulose (REFRESH PLUS) 0.5 % SOLN Place 1 drop into both eyes 2 (two) times daily.  [name] glucose blood (ACCU-CHEK GUIDE) test strip Use as instructed  . isosorbide mononitrate (IMDUR) 30 MG 24 hr tablet Take 1 tablet (30 mg total) by mouth daily.  . Lancets (ONETOUCH ULTRASOFT) lancets Once daily or PRN, Dx E11.9  . lisinopril (ZESTRIL) 20 MG tablet Take 1 tablet (20 mg total) by mouth daily.  . metFORMIN (GLUCOPHAGE) 500 MG tablet Take 1,000 mg by mouth 2 (two) times daily with a meal.  . Multiple Vitamins-Minerals (WOMENS 50+ MULTI VITAMIN/MIN PO) Take 1 tablet by mouth  daily with lunch.   . nitroGLYCERIN (NITROSTAT) 0.4 MG SL tablet Place 1 tablet (0.4 mg total) under the tongue every 5 (five) minutes as needed for chest pain.  . [DISCONTINUED] amLODipine (NORVASC) 5 MG tablet Take 1 tablet (5 mg total) by mouth daily.  . [DISCONTINUED] aspirin EC 81 MG tablet Take 81 mg by mouth at bedtime.  . [DISCONTINUED] atorvastatin (LIPITOR) 80 MG tablet Take 80 mg by mouth daily.  . [DISCONTINUED] isosorbide mononitrate (IMDUR) 30 MG 24 hr tablet Take 1 tablet (30 mg total) by mouth daily.  . [DISCONTINUED] lisinopril (ZESTRIL) 20 MG tablet Take 1 tablet (20 mg total) by mouth daily.  . [DISCONTINUED] nitroGLYCERIN (NITROSTAT) 0.4 MG SL tablet Place 1 tablet (0.4 mg total) under the tongue every 5 (five) minutes as needed for chest pain.     Allergies:   Morphine and related   Social History   Tobacco Use  . Smoking status: Current Every Day Smoker    Packs/day:  1.50    Years: 60.00    Pack years: 90.00    Types: Cigarettes  . Smokeless tobacco: Never Used  Substance Use Topics  . Alcohol use: No  . Drug use: No     Family Hx: The patient's family history is negative for CAD and Diabetes Mellitus II.  ROS:   Please see the history of present illness.    All other systems reviewed and are negative.   Prior CV studies:   The following studies were reviewed today:  Coronary CTA 09/21/2018 IMPRESSION: 1. Coronary calcium score of 662. This was 27 percentile for age and sex matched control.  2. Normal coronary origin with right dominance.  3. Moderate CAD in the proximal and mid LAD and proximal and mid LCX arteries. Additional analysis with CT FFR will be submitted.  4. Severe mitral annular calcifications. FFR 1. Left Main: No significant stenosis.  2. LAD: Proximal: 0.92, distal: 0.80. 3. D1: 0.81. 4. LCX: Proximal: 0.98, distal: 0.78. 5. RCA: Proximal: 0.98, distal: 0.85. 6. PDA: 0.81.  IMPRESSION: 1. CT FFR analysis showed  stenosis in the mid portion of a non-dominant LCX artery. A medical management would be recommended unless there are significant symptoms.  Carotid US 07/31/2018 Bilateral ICA 1-39  Myoview 04/15/2018 Large size, moderate severity partially reversible anteroseptal and septal perfusion defect and a medium size, moderate severity partially reversible perfusion defect of the lateral wall. These findings are suggestive of ischemia, but artifact cannot be excluded. Clinical correlation is recommended and additional testing may be necessary.  Echocardiogram 03/25/2018 Mild concentric LVH, EF normal, normal wall motion, grade 1 diastolic dysfunction, MAC, moderate MR, mild LAE, PASP 32    Labs/Other Tests and Data Reviewed:    EKG:  No ECG reviewed.  Recent Labs: 10/21/2018: TSH 0.87 07/19/2019: ALT 11 09/02/2019: BUN 19; Creatinine, Ser 1.10; Hemoglobin 16.1; Platelets 273; Potassium 4.6; Sodium 142   Recent Lipid Panel Lab Results  Component Value Date/Time   CHOL 128 07/19/2019 08:33 AM   TRIG 150 (H) 07/19/2019 08:33 AM   HDL 58 07/19/2019 08:33 AM   CHOLHDL 2.2 07/19/2019 08:33 AM   CHOLHDL 2.7 03/24/2018 07:30 PM   LDLCALC 40 07/19/2019 08:33 AM   LDLDIRECT 83.0 12/21/2014 10:40 AM    Wt Readings from Last 3 Encounters:  09/20/19 95 lb (43.1 kg)  09/10/19 95 lb (43.1 kg)  09/10/19 95 lb (43.1 kg)     Objective:    Vital Signs:  BP (!) 134/58   Pulse 88   Ht _0  (1.448 m)   Wt 95 lb (43.1 kg)   BMI 20.56 kg/m    VITAL SIGNS:  reviewed GEN:  no acute distress  ASSESSMENT & PLAN:    1. History of noncompliance with medical treatment, presenting hazards to health As noted, the nurse with [facility] was also present for the visit.  She did arrange her medications in pillboxes at last visit.  This seemed to be helpful for the patient.  However, since then, [place] also visited her and changed some of her medications around.  The nurse Jackelyn Poling) will  review her medications again today and ensure that they are simplified for her.  Above all, we have been able to arrange pill packs through [facility].  We will try to make sure that her medications are transferred over to [facility].  Of note, her pill packs will be written [place].  2. Coronary artery disease involving native coronary artery of  native heart with angina pectoris (HCC) Moderate CAD by coronary CTA 10/19.  There was borderline significant stenosis in the mid LCx.  Medical therapy has been recommended unless she has worsening symptoms.  It seems that she has been taking her medications more consistently.  Since last seen, she has been having less anginal symptoms.  At this point, I recommend that we continue her current medications.  I would rather give her more time to get her medications squared away before we make any further adjustments.  At follow-up in a couple of weeks, if her medications seem to be squared away, we can certainly consider increasing isosorbide or adding metoprolol succinate.  3. Essential hypertension Borderline control.  Continue current medications.  Consider adjustments at follow-up in a couple of weeks.   Time:   Today, I have spent 20 minutes with the patient with telehealth technology discussing the above problems.     Medication Adjustments/Labs and Tests Ordered: Current medicines are reviewed at length with the patient today.  Concerns regarding medicines are outlined above.   Tests Ordered: No orders of the defined types were placed in this encounter.   Medication Changes: Meds ordered this encounter  Medications  . amLODipine (NORVASC) 5 MG tablet    Sig: Take 1 tablet (5 mg total) by mouth daily.    Dispense:  90 tablet    Refill:  3  . aspirin EC 81 MG tablet    Sig: Take 1 tablet (81 mg total) by mouth at bedtime.    Dispense:  90 tablet    Refill:  3  . atorvastatin (LIPITOR) 80 MG tablet    Sig: Take 1 tablet (80 mg  total) by mouth daily.    Dispense:  90 tablet    Refill:  3  . isosorbide mononitrate (IMDUR) 30 MG 24 hr tablet    Sig: Take 1 tablet (30 mg total) by mouth daily.    Dispense:  90 tablet    Refill:  3  . lisinopril (ZESTRIL) 20 MG tablet    Sig: Take 1 tablet (20 mg total) by mouth daily.    Dispense:  90 tablet    Refill:  3  . nitroGLYCERIN (NITROSTAT) 0.4 MG SL tablet    Sig: Place 1 tablet (0.4 mg total) under the tongue every 5 (five) minutes as needed for chest pain.    Dispense:  50 tablet    Refill:  3    Follow Up:  Virtual Visit  in 2 week(s)  Signed, Richardson Dopp, PA-C  09/20/2019 1:32 PM    [facility]

## 2019-09-20 ENCOUNTER — Telehealth: Payer: Self-pay

## 2019-09-20 ENCOUNTER — Encounter: Payer: Self-pay | Admitting: Physician Assistant

## 2019-09-20 ENCOUNTER — Other Ambulatory Visit: Payer: Self-pay

## 2019-09-20 ENCOUNTER — Telehealth (INDEPENDENT_AMBULATORY_CARE_PROVIDER_SITE_OTHER): Payer: PPO | Admitting: Physician Assistant

## 2019-09-20 VITALS — BP 134/58 | HR 88 | Ht <= 58 in | Wt 95.0 lb

## 2019-09-20 DIAGNOSIS — Z9119 Patient's noncompliance with other medical treatment and regimen: Secondary | ICD-10-CM | POA: Diagnosis not present

## 2019-09-20 DIAGNOSIS — I25119 Atherosclerotic heart disease of native coronary artery with unspecified angina pectoris: Secondary | ICD-10-CM

## 2019-09-20 DIAGNOSIS — I10 Essential (primary) hypertension: Secondary | ICD-10-CM

## 2019-09-20 DIAGNOSIS — Z91199 Patient's noncompliance with other medical treatment and regimen due to unspecified reason: Secondary | ICD-10-CM

## 2019-09-20 MED ORDER — AMLODIPINE BESYLATE 5 MG PO TABS: 5.0000 mg | ORAL_TABLET | Freq: Every day | ORAL | 3 refills | Status: DC

## 2019-09-20 MED ORDER — NITROGLYCERIN 0.4 MG SL SUBL: 0.4000 mg | SUBLINGUAL_TABLET | SUBLINGUAL | 3 refills | Status: DC | PRN

## 2019-09-20 MED ORDER — LISINOPRIL 20 MG PO TABS: 20.0000 mg | ORAL_TABLET | Freq: Every day | ORAL | 3 refills | Status: DC

## 2019-09-20 MED ORDER — ASPIRIN EC 81 MG PO TBEC: 81.0000 mg | DELAYED_RELEASE_TABLET | Freq: Every day | ORAL | 3 refills | Status: DC

## 2019-09-20 MED ORDER — ATORVASTATIN CALCIUM 80 MG PO TABS: 80.0000 mg | ORAL_TABLET | Freq: Every day | ORAL | 3 refills | Status: DC

## 2019-09-20 MED ORDER — ISOSORBIDE MONONITRATE ER 30 MG PO TB24: 30.0000 mg | ORAL_TABLET | Freq: Every day | ORAL | 3 refills | Status: DC

## 2019-09-20 NOTE — Telephone Encounter (Signed)
FYI

## 2019-09-20 NOTE — Telephone Encounter (Signed)
Pt's son called and is very concerned about his mother, [name]. He states she is having increased memory issues and not remembering to take her medications. Cardiology has suggested pill packs and they have set this up with [facility]. They just need all her meds from Dr. Jerilee Hoh sent over to be included. They have home health coming out several times a week, set up by cardiology. The nurse was out this weekend and checked pt's BG and it was around 400. She called a doctor, son doesn't know who, and was advised to increase Metformin to 1000mg  daily. Pt has been doing this since Saturday and BG has been 170 or so. Son was concerned that this did not come from Dr. Jerilee Hoh. He has set up appt and asked to be called during the visit to discuss his concerns.

## 2019-09-20 NOTE — Patient Instructions (Addendum)
Medication Instructions:  Your physician recommends that you continue on your current medications as directed. Please refer to the Current Medication list given to you today.  *If you need a refill on your cardiac medications before your next appointment, please call your pharmacy*  Lab Work: none If you have labs (blood work) drawn today and your tests are completely normal, you will receive your results only by: [name] MyChart Message (if you have MyChart) OR . A paper copy in the mail If you have any lab test that is abnormal or we need to change your treatment, we will call you to review the results.  Testing/Procedures: none  Follow-Up: At [facility], you and your health needs are our priority.  As part of our continuing mission to provide you with exceptional heart care, we have created designated Provider Care Teams.  These Care Teams include your primary Cardiologist (physician) and Advanced Practice Providers (APPs -  Physician Assistants and Nurse Practitioners) who all work together to provide you with the care you need, when you need it.  Your next appointment:   10/04/19 at 12:15 PM  The format for your next appointment:   Virtual Visit   Provider:   Richardson Dopp, PA  Other Instructions  We have sent in refills for your cardiac medications to [facility]

## 2019-09-20 NOTE — Progress Notes (Signed)
Home Visit on behalf of [facility] to assess medication compliance and vitals.  There was a folder on the counter by [facility].  Their nurse came on Sat., 09/18/19 and found Ms.Dove's CBG to be 400 so changed her dosage of metformin from 500mg  BID to 1000mg  BID according to son, Laverna Peace, who had called in during the visit. Jimmy wasn't able to say who made that order change and there was not a new order written in the [facility] folder.   [facility] called in during the visit, as well, to screen for the virtual f/u by Richardson Dopp, PA.     Today's VS readings: 134/58, 88 by her wrist cuff, RR 18.  CBG = 178.  Results reported to Richardson Dopp, [place] while son, Laverna Peace,  was also on the call.    Scott, [place] didn't make any medication changes.  PCP will be contacted by son Laverna Peace to make a f/u appt (w/Dr. Jerilee Hoh) as his mom hasn't had a f/u with her recently.  Laverna Peace also said he'd let PCP know about the CBG reading of 400 on Sat. 10/24 by [place] and provide update.    I placed Arshiya's medications, as ordered, into "bubble packs" that had been ordered by Kennyth Lose, LCSW.  The bubble packs held all her AM pills as "1st dose" and her afternoon pills as "2nd dose" making it very clear as to when to take her medications w/o having to refill her medication box, which had been confusing to her.  She stated understanding of which "bubbles" of pills to take and when to take them.  There was a total of 6 weeks worth of medications that were placed in the packs and labeled 1-6 so Cailen could get the next pack when the previous was complete.  Brielyn's brother was also there to see and to be able to help/remind her prn.  Carlyann said he visits everyday and helps her with many things.  Latishia expressed how her memory is getting very bad now  [place], [place] will have cardiac meds called in to the [facility] who will fill them into bubble packs, have them delivered to [facility] along w/instruction in  [place], [place] native & prefered language Laverna Peace, the son will also let PCP know about the bubble packs so the medications she (Dr. Jerilee Hoh)  manages can also be filled into the same bubble packs by [facility].   Carmie was very thankful for the help in getting her medications.   We also went through all 4 of her glucometers she had, each having various issues of not working or strips having expired.  Lashonta's brother brought her a new glucometer w/new strips that he had for him.  He said he didn't need it as he had another at home he used.   She was able to demonstrate how to use the glucometer.  She was also able to demonstrate where to document her CBG and BP Q day on her tracking sheet.  I will do a f/u home visit on Friday, 10/30.  Scott, [place] will do a f/u virtual visit on 11/9 @ 12:15 and I will be there as well to help facilitate.

## 2019-09-22 ENCOUNTER — Ambulatory Visit (INDEPENDENT_AMBULATORY_CARE_PROVIDER_SITE_OTHER): Payer: PPO | Admitting: Internal Medicine

## 2019-09-22 ENCOUNTER — Other Ambulatory Visit: Payer: Self-pay

## 2019-09-22 ENCOUNTER — Encounter: Payer: Self-pay | Admitting: Internal Medicine

## 2019-09-22 ENCOUNTER — Encounter: Payer: Self-pay | Admitting: Neurology

## 2019-09-22 VITALS — BP 130/70 | HR 103 | Temp 98.3°F | Wt 96.4 lb

## 2019-09-22 DIAGNOSIS — I10 Essential (primary) hypertension: Secondary | ICD-10-CM

## 2019-09-22 DIAGNOSIS — Z720 Tobacco use: Secondary | ICD-10-CM | POA: Diagnosis not present

## 2019-09-22 DIAGNOSIS — I25118 Atherosclerotic heart disease of native coronary artery with other forms of angina pectoris: Secondary | ICD-10-CM

## 2019-09-22 DIAGNOSIS — E119 Type 2 diabetes mellitus without complications: Secondary | ICD-10-CM | POA: Diagnosis not present

## 2019-09-22 DIAGNOSIS — E785 Hyperlipidemia, unspecified: Secondary | ICD-10-CM | POA: Diagnosis not present

## 2019-09-22 DIAGNOSIS — R413 Other amnesia: Secondary | ICD-10-CM

## 2019-09-22 LAB — POCT GLYCOSYLATED HEMOGLOBIN (HGB A1C): Hemoglobin A1C: 8.5 % — AB (ref 4.0–5.6)

## 2019-09-22 MED ORDER — METFORMIN HCL 1000 MG PO TABS: 1000.0000 mg | ORAL_TABLET | Freq: Two times a day (BID) | ORAL | 6 refills | Status: DC

## 2019-09-22 NOTE — Patient Instructions (Signed)
-  Nice seeing you today!!  -Lab work today; will notify you once results are available.  -See you back in 3 months.

## 2019-09-22 NOTE — Progress Notes (Signed)
Established Patient Office Visit     CC/Reason for Visit: Elevated blood glucose and memory loss concerns  HPI: [name] is a 85 y.o. female who is coming in today for the above mentioned reasons. Past Medical History is significant for: Coronary artery disease followed closely by cardiology, uncontrolled type 2 diabetes, hypertension, hyperlipidemia.  She has recently been working closely with cardiology in regards to medication assistance.  We have started to have more more concerns about memory impairment.  Cardiology has been able to dispatch remote health nursing who is coming out twice a week.  During a visit last week she was noted to have a blood glucose of 400 and hence this appointment was made today.  I have called patient's son and healthcare power of attorney, [name], today.  They have arranged medications to be dispensed via pill pack through [place] which is easier for patient.  He is very concerned about memory loss and would like to know how we would move forward with this.   Past Medical/Surgical History: Past Medical History:  Diagnosis Date  . At risk for medication noncompliance   . CAD (coronary artery disease)    a. borderline disease by CT 08/2018.  . Diabetes mellitus, type 2 ([place])   . History of cervical cancer   . Hyperlipidemia   . Hypertension   . Moderate mitral regurgitation   . Noncompliance with medication treatment due to underuse of medication   . Raynaud's phenomenon   . TN (trigeminal neuralgia)     Past Surgical History:  Procedure Laterality Date  . BREAST BIOPSY    . BREAST ENHANCEMENT SURGERY    . CHOLECYSTECTOMY N/A 02/14/2014   Procedure: LAPAROSCOPIC CHOLECYSTECTOMY;  Surgeon: Ralene Ok, MD;  Location: [facility];  Service: General;  Laterality: N/A;  . ERCP N/A 03/08/2015   Procedure: ENDOSCOPIC RETROGRADE CHOLANGIOPANCREATOGRAPHY (ERCP);  Surgeon: Ladene Artist, MD;  Location: [facility];  Service:  Endoscopy;  Laterality: N/A;  . MASS EXCISION Right 12/25/2016   Procedure: EXCISION RIGHT SCALP MASS SEBACEOUS CYST;  Surgeon: Coralie Keens, MD;  Location: [facility];  Service: General;  Laterality: Right;  . TOTAL ABDOMINAL HYSTERECTOMY  1963   for cervical cancer in situ    Social History:  reports that she has been smoking cigarettes. She has a 90.00 pack-year smoking history. She has never used smokeless tobacco. She reports that she does not drink alcohol or use drugs.  Allergies: Allergies  Allergen Reactions  . Morphine And Related Itching    Short lived, mild itching.    Family History:  Family History  Problem Relation Age of Onset  . CAD Neg Hx   . Diabetes Mellitus II Neg Hx      Current Outpatient Medications:  .  amLODipine (NORVASC) 5 MG tablet, Take 1 tablet (5 mg total) by mouth daily., Disp: 90 tablet, Rfl: 3 .  aspirin EC 81 MG tablet, Take 1 tablet (81 mg total) by mouth at bedtime., Disp: 90 tablet, Rfl: 3 .  atorvastatin (LIPITOR) 80 MG tablet, Take 1 tablet (80 mg total) by mouth daily., Disp: 90 tablet, Rfl: 3 .  Blood Glucose Monitoring Suppl (ONE TOUCH ULTRA MINI) w/Device KIT, USE TO CHECK BLOOD SUGAR DAILY AND AS NEEDED, Disp: 1 each, Rfl: 0 .  carboxymethylcellulose (REFRESH PLUS) 0.5 % SOLN, Place 1 drop into both eyes 2 (two) times daily., Disp: , Rfl:  .  glucose blood (ACCU-CHEK GUIDE) test strip, Use as instructed, Disp:  100 each, Rfl: 12 .  isosorbide mononitrate (IMDUR) 30 MG 24 hr tablet, Take 1 tablet (30 mg total) by mouth daily., Disp: 90 tablet, Rfl: 3 .  Lancets (ONETOUCH ULTRASOFT) lancets, Once daily or PRN, Dx E11.9, Disp: 100 each, Rfl: 12 .  lisinopril (ZESTRIL) 20 MG tablet, Take 1 tablet (20 mg total) by mouth daily., Disp: 90 tablet, Rfl: 3 .  metFORMIN (GLUCOPHAGE) 1000 MG tablet, Take 1 tablet (1,000 mg total) by mouth 2 (two) times daily with a meal., Disp: 60 tablet, Rfl: 6 .  Multiple Vitamins-Minerals (WOMENS 50+ MULTI  VITAMIN/MIN PO), Take 1 tablet by mouth daily with lunch. , Disp: , Rfl:  .  nitroGLYCERIN (NITROSTAT) 0.4 MG SL tablet, Place 1 tablet (0.4 mg total) under the tongue every 5 (five) minutes as needed for chest pain., Disp: 50 tablet, Rfl: 3  Review of Systems:  Constitutional: Denies fever, chills, diaphoresis, appetite change and fatigue.  HEENT: Denies photophobia, eye pain, redness, hearing loss, ear pain, congestion, sore throat, rhinorrhea, sneezing, mouth sores, trouble swallowing, neck pain, neck stiffness and tinnitus.   Respiratory: Denies SOB, DOE, cough, chest tightness,  and wheezing.   Cardiovascular: Denies chest pain, palpitations and leg swelling.  Gastrointestinal: Denies nausea, vomiting, abdominal pain, diarrhea, constipation, blood in stool and abdominal distention.  Genitourinary: Denies dysuria, urgency, frequency, hematuria, flank pain and difficulty urinating.  Endocrine: Denies: hot or cold intolerance, sweats, changes in hair or nails, polyuria, polydipsia. Musculoskeletal: Denies myalgias, back pain, joint swelling, arthralgias and gait problem.  Skin: Denies pallor, rash and wound.  Neurological: Denies dizziness, seizures, syncope, weakness, light-headedness, numbness and headaches.  Hematological: Denies adenopathy. Easy bruising, personal or family bleeding history  Psychiatric/Behavioral: Denies suicidal ideation, mood changes, confusion, nervousness, sleep disturbance and agitation    Physical Exam: Vitals:   09/22/19 1129  BP: 130/70  Pulse: (!) 103  Temp: 98.3 F (36.8 C)  TempSrc: Temporal  SpO2: 96%  Weight: 96 lb 6.4 oz (43.7 kg)    Body mass index is 20.86 kg/m.   Constitutional: NAD, calm, comfortable Eyes: PERRL, lids and conjunctivae normal ENMT: Mucous membranes are moist.  Respiratory: clear to auscultation bilaterally, no wheezing, no crackles. Normal respiratory effort. No accessory muscle use.  Cardiovascular: Regular rate and  rhythm, no murmurs / rubs / gallops. No extremity edema. 2+ pedal pulses.  Abdomen: no tenderness, no masses palpated. No hepatosplenomegaly. Bowel sounds positive.  Musculoskeletal: no clubbing / cyanosis. No joint deformity upper and lower extremities. Good ROM, no contractures. Normal muscle tone.  Skin: no rashes, lesions, ulcers. No induration Neurologic: Nonfocal Psychiatric: Judgment and insight appear impaired due to memory loss. Alert and oriented x 3. Normal mood.    Impression and Plan:  Diabetes mellitus type 2 in nonobese (HCC)  -A1c has increased to 8.4 from 6.0 previously. -Suspect this is all due to medication noncompliance and should improve now that she has remote health nursing and medications delivered via pill pack. -CBG in office today is 170. -Agree with decreasing Metformin from 500 to 1000 mg twice daily.  Memory loss   MMSE - Mini Mental State Exam 09/22/2019  Orientation to time 3  Orientation to Place 5  Registration 3  Attention/ Calculation 0  Recall 3  Language- name 2 objects 2  Language- repeat 1  Language- follow 3 step command 3  Language- read & follow direction 1  Write a sentence 0  Copy design 0  Total score 21   -MMSE in office  today.  She scored 21 which is indicative of mild dementia. -Check TSH and vitamin B12 to rule out possible reversible etiologies. -Will refer to neurology for further recommendations. -Discussed with son [name] that we will need to eventually talk about taking her keys away and restricting her driving as well as she may possibly in the future need more home assistance.  Tobacco abuse -She continues to smoke about a pack a day.  Essential hypertension -Well-controlled in office today.  Coronary artery disease of native artery of native heart with stable angina pectoris ([place]) -No chest pain in office today, follows with cardiology.    Patient Instructions  -Nice seeing you today!!  -Lab work today; will  notify you once results are available.  -See you back in 3 months.     Lelon Frohlich, MD [facility]

## 2019-09-23 ENCOUNTER — Telehealth: Payer: Self-pay | Admitting: Internal Medicine

## 2019-09-23 LAB — VITAMIN D 25 HYDROXY (VIT D DEFICIENCY, FRACTURES): VITD: 40.91 ng/mL (ref 30.00–100.00)

## 2019-09-23 LAB — TSH: TSH: 0.78 u[IU]/mL (ref 0.35–4.50)

## 2019-09-23 LAB — VITAMIN B12: Vitamin B-12: 1032 pg/mL — ABNORMAL HIGH (ref 211–911)

## 2019-09-23 NOTE — Telephone Encounter (Signed)
Pt's son calling in:  states that he understood that pt was supposed to increase Metformin to 1000mg  daily but RX was sent in for 1000mg  2x daily. Pt's son also wants for doctor to send in multivitamin so that this can be added to pill pack. Pt uses  [place], [place] - [address] [phone] (Phone) [phone] (Fax)

## 2019-09-23 NOTE — Telephone Encounter (Signed)
She is already supposed to be on 1000 mg daily (500 mg BID). We are increasing to 1000 mg BID. MVI is OTC.

## 2019-09-24 ENCOUNTER — Ambulatory Visit: Payer: Self-pay | Admitting: *Deleted

## 2019-09-24 MED ORDER — MULTIVITAMINS PO CAPS: 1.0000 | ORAL_CAPSULE | Freq: Every day | ORAL | 3 refills | Status: DC

## 2019-09-24 NOTE — Telephone Encounter (Signed)
Patient's son calling with questions/concerns. Reviewed correct current dose of Metformin-1000 mg twice daily with food. Please respond via MyChart:  1) Concerned with her taking the increased dosage of Metformin. Recently increased to 1000 mg twice daily with food. Reviewed recent blood sugar spike and A1c. One of the concerns with the increased dose is that she may not be eating enough to sustain the increased/extra daily dose of the Metformin. Recommended monitoring cbg daily for awhile. He stated a home health nurse would be needed as he does not live local.  2) Concerned that Metformin has a side effect of blocking Vit B12 and D and this may be a part of the memory issue for the patient. Reviewed her most recent Vit B12 level with him. He is asking if there is an alternative to Metformin that may not have this side effect. 3) Pharmacy is providing pill packs for the patient and is requesting a prescription for the MVI.  Routing to provider to advise. Reason for Disposition . [1] Caller requesting NON-URGENT health information AND [2] PCP's office is the best resource  Answer Assessment - Initial Assessment Questions 1. REASON FOR CALL or QUESTION: "What is your reason for calling today?" or "How can I best help you?" or "What question do you have that I can help answer?"     Patient's son calling with questions/concerns regarding her medications.  Protocols used: INFORMATION ONLY CALL - NO TRIAGE-A-AH

## 2019-09-24 NOTE — Telephone Encounter (Signed)
Spoke with son and pharmacist

## 2019-09-26 NOTE — Progress Notes (Signed)
Home Visit on behalf of [facility] on Friday 09/24/19 at 1:30pm: [name] had brought out her medication pack that I had filled up last week.   She had 2 evening doses (Wed & Thurs) that were left in the package.  Friday am dose was also still left in the pack, although she said that she had taken her meds this morning, which would have been the Friday am dose. Her missed doses were removed from the pill pack, to avoid confusion of which she was to take, and was going to put the pills back into the pill bottles.  She wasn't able to locate the bottles that were already open but did pull out of her pantry several new, unopened bottles of various medications; both meds she's currently taking and some that were d/c'd. She did mention several times about how bad her memory is getting.  It looked as thought she attempted to log her BP once since my last visit on Monday 09/20/19, but it was not legible.   We reviewed taking her BP (138/71, 87) and how to log it as well as her CBG (230) and where to write it.  She said she had eaten a piece of pecan pie this morning.   She reported having an episode of chest heaviness once since last visit but didn't take any nitro as she normally does.   She reported that this weekend there was to be a big event [place] her [place] and she planned on hosting some of her friends to stay at her home and would attend so long as it would be allowed.   Plan is to see her again on Monday 09/27/19 and she is agreeable.

## 2019-09-27 NOTE — Progress Notes (Signed)
Home Visit on behalf of [facility] to assess medication compliance.  [name] was pleasant and free of distress on arrival and throughout the visit.  She was still using medication pack #1 and Sunday "am" meds were still in the pill pocket.  She stated she took the Sunday "pm" meds this am but couldn't explain why she took them vs the am meds and why she didn't take them yesterday, which was Sunday.  [name] did say she [place] the [place] [employer] [place] for an event that was hosted there.  She said it was wonderful and she had a nice time.  Assuming she forgot to take them yesterday because of the event, though she couldn't confirm this.  It was also noted that she hadn't monitored her BP or CBG since the last home visit. BP today 124/71, 80, 18 CBG 164 She reported no chest heaviness throughout the weekend (since last home visit) and therefore hasn't used any nitro.  She did remember to call her son, [name], saying that he wanted to be called during the home visit.  [name] wanted an update on her med compliance & vitals.  I gave him the above listed VS and explained the medication that was missed yesterday and what she said she took today. [name] stated that [facility] would be delivering her new pill packets today between 1-5pm and they'd take all her Rx'd meds that were currently in the house to help avoid confusion as to what to take and when to take them.    [name] and I gathered all bottles of her meds that would be placed into the [facility] pill packs and put them into a bag, labeled it for [facility] to pick them up and take them away to help her avoid confusion on what to take and when to take them.  Her new pill packs will have instructions in [place], and for this she is grateful as she doesn't read in [place].    Plan to return on Friday to see how she is doing with the new [facility] pill packs w/[place]namese instructions.

## 2019-10-01 ENCOUNTER — Telehealth: Payer: Self-pay

## 2019-10-01 NOTE — Telephone Encounter (Signed)
On last visit (Mon 09/27/19) we agreed I would come by today for home visit for med compliance check, but she did ask that I call her first.   Attempted to contact Ms. Ardeth today x 3 with no answer.  Left VM's x 2.  No return call received.  Will reach out again to set up appt for home visit for Monday 10/04/19 during her virtual visit w/[facility] [place], [place].

## 2019-10-03 NOTE — Progress Notes (Signed)
Virtual Visit via Telephone Note   This visit type was conducted due to national recommendations for restrictions regarding the COVID-19 Pandemic (e.g. social distancing) in an effort to limit this patient's exposure and mitigate transmission in our community.  Due to her co-morbid illnesses, this patient is at least at moderate risk for complications without adequate follow up.  This format is felt to be most appropriate for this patient at this time.  The patient did not have access to video technology/had technical difficulties with video requiring transitioning to audio format only (telephone).  All issues noted in this document were discussed and addressed.  No physical exam could be performed with this format.  Please refer to the patient's chart for her  consent to telehealth for [name].   Date:  10/04/2019   ID:  [name], DOB 1934-11-06, MRN [id number]  Patient Location: Home Provider Location: Office  PCP:  Isaac Bliss, Rayford Halsted, MD  Cardiologist:  [name], MD / Melina Copa, PA-C  Electrophysiologist:  None   Evaluation Performed:  Follow-Up Visit  Chief Complaint:  CAD  History of Present Illness:    [name] is a 85 y.o. female with:  Coronary artery disease ? Myoview 5/19 with anteroseptal and lateral ischemia ? CTA 10/19: Coronary calcium 662, moderate CAD in the LAD and LCx with borderline significance in the LCx by FFR>>medical therapy unless significant symptoms  Mitral regurgitation ? Moderate by echo 5/19  Carotid artery disease  Diabetes mellitus  Hypertension  Hyperlipidemia  Raynaud's disease  History of elevated LFTs with dilated common bile duct  Tobacco abuse  Trigeminal neuralgia  Medicationnon adherence  Ms.Campbell-Robertsonwas admitted in May 2019 with chest pain and elevated LFTs. Please see the detailed notes by Melina Copa, PA-Cfrom 11/7/19for completedetails. Briefly, her  anatomy did not allow for ERCP and it was recommended that she have this performed at a tertiary [facility]. She preferred conservative management. For her chest pain, a nuclear stress test demonstrated probable anteroseptal and lateral ischemia. Follow-up coronary CTA was recommended. The patient held off on this until October 2019. This demonstrated mod dz in the LAD and LCx.By [facility], she had borderline significant lesion in the mid LCx. Recommendation was to manage this medically unless she has severe symptoms. She hashad difficulty adhering to medications in the past.  We have been working with [facility] to help improve medication adherence.  She is getting pill packs from the pharmacy and has frequent home visits.  She was last seen via Telemedicine 09/20/2019.    Today, she is seen with the assistance of Vanita Ingles, RN with [facility] and her son, [name] (both were on the call together).  The patient notes less anginal symptoms.  She has had a couple episodes of chest pain since last seen.  She takes NTG rarely. She has not had shortness of breath, syncope, near syncope, leg swelling.    Past Medical History:  Diagnosis Date  . At risk for medication noncompliance   . CAD (coronary artery disease)    a. borderline disease by CT 08/2018.  . Diabetes mellitus, type 2 ([place])   . History of cervical cancer   . Hyperlipidemia   . Hypertension   . Moderate mitral regurgitation   . Noncompliance with medication treatment due to underuse of medication   . Raynaud's phenomenon   . TN (trigeminal neuralgia)    Past Surgical History:  Procedure Laterality Date  . BREAST BIOPSY    . BREAST  ENHANCEMENT SURGERY    . CHOLECYSTECTOMY N/A 02/14/2014   Procedure: LAPAROSCOPIC CHOLECYSTECTOMY;  Surgeon: Ralene Ok, MD;  Location: [facility];  Service: General;  Laterality: N/A;  . ERCP N/A 03/08/2015   Procedure: ENDOSCOPIC RETROGRADE CHOLANGIOPANCREATOGRAPHY (ERCP);  Surgeon: Ladene Artist,  MD;  Location: [facility];  Service: Endoscopy;  Laterality: N/A;  . MASS EXCISION Right 12/25/2016   Procedure: EXCISION RIGHT SCALP MASS SEBACEOUS CYST;  Surgeon: Coralie Keens, MD;  Location: [facility];  Service: General;  Laterality: Right;  . TOTAL ABDOMINAL HYSTERECTOMY  1963   for cervical cancer in situ     Current Meds  Medication Sig  . amLODipine (NORVASC) 5 MG tablet Take 1 tablet (5 mg total) by mouth daily.  [name] aspirin EC 81 MG tablet Take 1 tablet (81 mg total) by mouth at bedtime.  [name] atorvastatin (LIPITOR) 80 MG tablet Take 1 tablet (80 mg total) by mouth daily.  . Blood Glucose Monitoring Suppl (ONE TOUCH ULTRA MINI) w/Device KIT USE TO CHECK BLOOD SUGAR DAILY AND AS NEEDED  . carboxymethylcellulose (REFRESH PLUS) 0.5 % SOLN Place 1 drop into both eyes 2 (two) times daily.  [name] glucose blood (ACCU-CHEK GUIDE) test strip Use as instructed  . isosorbide mononitrate (IMDUR) 30 MG 24 hr tablet Take 1 tablet (30 mg total) by mouth daily.  . Lancets (ONETOUCH ULTRASOFT) lancets Once daily or PRN, Dx E11.9  . lisinopril (ZESTRIL) 20 MG tablet Take 1 tablet (20 mg total) by mouth daily.  . metFORMIN (GLUCOPHAGE) 1000 MG tablet Take 1 tablet (1,000 mg total) by mouth 2 (two) times daily with a meal.  . Multiple Vitamin (MULTIVITAMIN) capsule Take 1 capsule by mouth daily.  . nitroGLYCERIN (NITROSTAT) 0.4 MG SL tablet Place 1 tablet (0.4 mg total) under the tongue every 5 (five) minutes as needed for chest pain.     Allergies:   Morphine and related   Social History   Tobacco Use  . Smoking status: Current Every Day Smoker    Packs/day: 1.50    Years: 60.00    Pack years: 90.00    Types: Cigarettes  . Smokeless tobacco: Never Used  Substance Use Topics  . Alcohol use: No  . Drug use: No     Family Hx: The patient's family history is negative for CAD and Diabetes Mellitus II.  ROS:   Please see the history of present illness.    All other systems reviewed and are  negative.   Prior CV studies:   The following studies were reviewed today:  Coronary CTA 09/21/2018 IMPRESSION: 1. Coronary calcium score of 662. This was 63 percentile for age and sex matched control.  2. Normal coronary origin with right dominance.  3. Moderate CAD in the proximal and mid LAD and proximal and mid LCX arteries. Additional analysis with CT FFR will be submitted.  4. Severe mitral annular calcifications. FFR 1. Left Main: No significant stenosis.  2. LAD: Proximal: 0.92, distal: 0.80. 3. D1: 0.81. 4. LCX: Proximal: 0.98, distal: 0.78. 5. RCA: Proximal: 0.98, distal: 0.85. 6. PDA: 0.81.  IMPRESSION: 1. CT FFR analysis showed stenosis in the mid portion of a non-dominant LCX artery. A medical management would be recommended unless there are significant symptoms.  Carotid US 07/31/2018 Bilateral ICA 1-39  Myoview 04/15/2018 Large size, moderate severity partially reversible anteroseptal and septal perfusion defect and a medium size, moderate severity partially reversible perfusion defect of the lateral wall. These findings are suggestive of ischemia, but artifact cannot  be excluded. Clinical correlation is recommended and additional testing may be necessary.  Echocardiogram 03/25/2018 Mild concentric LVH, EF normal, normal wall motion, grade 1 diastolic dysfunction, MAC, moderate MR, mild LAE, PASP 32  Labs/Other Tests and Data Reviewed:    EKG:  No ECG reviewed.  Recent Labs: 07/19/2019: ALT 11 09/02/2019: BUN 19; Creatinine, Ser 1.10; Hemoglobin 16.1; Platelets 273; Potassium 4.6; Sodium 142 09/22/2019: TSH 0.78   Recent Lipid Panel Lab Results  Component Value Date/Time   CHOL 128 07/19/2019 08:33 AM   TRIG 150 (H) 07/19/2019 08:33 AM   HDL 58 07/19/2019 08:33 AM   CHOLHDL 2.2 07/19/2019 08:33 AM   CHOLHDL 2.7 03/24/2018 07:30 PM   LDLCALC 40 07/19/2019 08:33 AM   LDLDIRECT 83.0 12/21/2014 10:40 AM    Wt Readings from Last 3 Encounters:   10/04/19 95 lb (43.1 kg)  09/22/19 96 lb 6.4 oz (43.7 kg)  09/20/19 95 lb (43.1 kg)     Objective:    Vital Signs:  BP (!) 113/52   Pulse 88   Ht _0  (1.448 m)   Wt 95 lb (43.1 kg)   BMI 20.56 kg/m    VITAL SIGNS:  reviewed GEN:  no acute distress PSYCH:  normal mood  ASSESSMENT & PLAN:    1. History of noncompliance with medical treatment, presenting hazards to health The RN reviewed her medications today and the patient has still been taking her medications incorrectly as some of the pill packs still have medications in them.  She is taking some of the PM meds in the AM.  Her son notes that she is being referred to Neuro to workup dementia.  The RN will make sure the instructions are written in [place] for her. The pharmacy was supposed to do this but has not.  She will continue to visit the patient 2 times a week.    2. Coronary artery disease involving native coronary artery of native heart with angina pectoris (HCC) Moderate CAD by coronary CTA 10/19. There was borderline significant stenosis in the mid LCx. Medical therapy has been recommended unless she has worsening symptoms.  Overall her symptoms are improved.  She still has some anginal symptoms.  Her BP looks good today.  The RN did get some lower readings while I was on the phone.  Question if this is related to compliance.  I would not make any adjustments until we can confirm she is taking meds correctly.  If her BP is on the low side we could decrease her Lisinopril.  I would avoid beta-blocker therapy given her Raynaud's.  If needed, we can increase her Isosorbide.   3. Essential hypertension BP better controlled.  Some readings were on the low side.  She is not symptomatic.  If we need to make changes as outlined above, this should be coordinated with her next fill of pill packs to avoid confusion with her medications.      Time:   Today, I have spent 26 minutes with the patient with telehealth technology  discussing the above problems.    Medication Adjustments/Labs and Tests Ordered: Current medicines are reviewed at length with the patient today.  Concerns regarding medicines are outlined above.   Tests Ordered: No orders of the defined types were placed in this encounter.   Medication Changes: No orders of the defined types were placed in this encounter.   Follow Up:  Virtual Visit  in 4-6 week(s)  Signed, Richardson Dopp, PA-C  10/04/2019 12:59  PM    [facility]

## 2019-10-04 ENCOUNTER — Telehealth: Payer: Self-pay | Admitting: Cardiology

## 2019-10-04 ENCOUNTER — Encounter: Payer: Self-pay | Admitting: Physician Assistant

## 2019-10-04 ENCOUNTER — Other Ambulatory Visit: Payer: Self-pay

## 2019-10-04 ENCOUNTER — Telehealth (INDEPENDENT_AMBULATORY_CARE_PROVIDER_SITE_OTHER): Payer: PPO | Admitting: Physician Assistant

## 2019-10-04 VITALS — BP 113/52 | HR 88 | Ht <= 58 in | Wt 95.0 lb

## 2019-10-04 DIAGNOSIS — I25119 Atherosclerotic heart disease of native coronary artery with unspecified angina pectoris: Secondary | ICD-10-CM

## 2019-10-04 DIAGNOSIS — Z91199 Patient's noncompliance with other medical treatment and regimen due to unspecified reason: Secondary | ICD-10-CM

## 2019-10-04 DIAGNOSIS — Z9119 Patient's noncompliance with other medical treatment and regimen: Secondary | ICD-10-CM | POA: Diagnosis not present

## 2019-10-04 DIAGNOSIS — I10 Essential (primary) hypertension: Secondary | ICD-10-CM

## 2019-10-04 NOTE — Telephone Encounter (Signed)
I called and spoke with patients son [name], he is aware that I will call him at 73, verify medications, weight, and blood pressure. Then when [place] calls, he can get his mom back on the phone at 12:15.

## 2019-10-04 NOTE — Patient Instructions (Signed)
Medication Instructions:   Your physician recommends that you continue on your current medications as directed. Please refer to the Current Medication list given to you today.  *If you need a refill on your cardiac medications before your next appointment, please call your pharmacy*  Lab Work:  None ordered today  Testing/Procedures:  None ordered today  Follow-Up:  On 11/01/19 at 11:45 with Richardson Dopp, PA-C

## 2019-10-04 NOTE — Progress Notes (Signed)
Home Visit on behalf of [facility] to check medication compliance.    New medication "pill pouches" from [place] had arrived to [name].  After speaking w/her son, [name], he said he had to pay out-of-pocket for them since she had recently just gotten medication refills from [facility] and insurance wouldn't cover them from [employer] yet.    Pill pouch #1 was empty except for Thurs and Fri morning doses.  [name] couldn't remember why they weren't taken.  She said she did take her am meds today but couldn't tell me from which pouch she took them.    [name], son, and [name], [name] called in for a conference call to follow up.  They were made aware of her BP, pulse and CBG as well as her partial med compliance.   [name]'s "brother, [name]" was there and stated understanding to how she should take her meds and agreed to check and/or remind her to take them when he visits daily.  [name] also was able to show which meds she should take and when to take them.  Will return on Friday 10/08/19 to follow up and see how she's doing w/her med compliance.

## 2019-10-04 NOTE — Telephone Encounter (Signed)
New Message   Pts son is calling and asks that the virtual appt today call be made to his number and then he will 3- way call his mom    Please call

## 2019-10-08 NOTE — Progress Notes (Signed)
Home Visit to assess medication compliance.  [name] has been taking her medications as directed in the pill packs.  Her son, [name] was updated. BP was 136/68, CBG 123.  She stated she hadn't needed to use her NTG since the previous visit at all.  Will revisit her on Monday 10/11/19

## 2019-10-15 NOTE — Progress Notes (Signed)
Home Visit on behalf of [facility] to check medication adherance.  [name] has been taking her medications as Rx'd in her pill packs by observation.  States she hasn't needed nitroglycerine but has been having some dizziness on occasion.  States she feels well overall.

## 2019-10-29 NOTE — Progress Notes (Addendum)
Home Visit to check [name]'s medication compliance.  There were a few evening doses that she hadn't taken.  Today's AM dose was not taken but she said she took yesterday am's dose this am.   CBG was found to be 171.  [name] admitted having gotten [place] and said a friend bought her a bottle of "Brain Plus" that she's been taking the last 3-4 days.  States she thinks it's helping her remember things.  [name] is aware of her virtual appt w/[facility] on Monday and the writer will be there at the same time

## 2019-10-31 NOTE — Progress Notes (Signed)
Cardiology Office Note:    Date:  11/01/2019   ID:  [name], DOB Feb 21, 1934, MRN [id number]  PCP:  Isaac Bliss, Rayford Halsted, MD  Cardiologist:  [name], MD  Electrophysiologist:  None   Referring MD: Isaac Bliss, [place]*   Chief Complaint  Patient presents with  . Follow-up    CAD    History of Present Illness:    [name] is a 85 y.o. female with:   Coronary artery disease ? Myoview 5/19 with anteroseptal and lateral ischemia ? CTA 10/19: Coronary calcium 662, moderate CAD in the LAD and LCx with borderline significance in the LCx by FFR>>medical therapy unless significant symptoms  Mitral regurgitation ? Moderate by echo 5/19  Carotid artery disease  Diabetes mellitus  Hypertension  Hyperlipidemia  Raynaud's disease  History of elevated LFTs with dilated common bile duct  Tobacco abuse  Trigeminal neuralgia  Medicationnon adherence  Ms.Campbell-Robertsonwas admitted in May 2019 with chest pain and elevated LFTs. Please see the detailed notes by Melina Copa, PA-Cfrom 11/7/19for completedetails. Briefly, her anatomy did not allow for ERCP and it was recommended that she have this performed at a tertiary center. She preferred conservative management. For her chest pain, a nuclear stress test demonstrated probable anteroseptal and lateral ischemia. Follow-up coronary CTA was recommended. The patient held off on this until October 2019. This demonstrated mod dz in the LAD and LCx.By [facility], she had borderline significant lesion in the mid LCx. Recommendation was to manage this medically unless she has severe symptoms. She hashad difficulty adhering to medications in the past.  We have been working with [facility] to help improve medication adherence.  She is getting pill packs from the pharmacy and has frequent home visits.  Ms. Narducci returns for follow-up.  She is here with her nurse, Jackelyn Poling and  her son joins [place] by phone.  She has been feeling well.  She has only had to use nitroglycerin on a few occasions.  She is using this much less than she had in the past.  She has not had shortness of breath.  She has not had orthopnea, lower extremity swelling or syncope.  Compliance with medication is much better.  She still misses some doses of medications.    Prior CV studies:   The following studies were reviewed today:  Coronary CTA 09/21/2018 IMPRESSION: 1. Coronary calcium score of 662. This was 70 percentile for age and sex matched control.  2. Normal coronary origin with right dominance.  3. Moderate CAD in the proximal and mid LAD and proximal and mid LCX arteries. Additional analysis with CT FFR will be submitted.  4. Severe mitral annular calcifications. FFR 1. Left Main: No significant stenosis.  2. LAD: Proximal: 0.92, distal: 0.80. 3. D1: 0.81. 4. LCX: Proximal: 0.98, distal: 0.78. 5. RCA: Proximal: 0.98, distal: 0.85. 6. PDA: 0.81.  IMPRESSION: 1. CT FFR analysis showed stenosis in the mid portion of a non-dominant LCX artery. A medical management would be recommended unless there are significant symptoms.  Carotid US 07/31/2018 Bilateral ICA 1-39  Myoview 04/15/2018 Large size, moderate severity partially reversible anteroseptal and septal perfusion defect and a medium size, moderate severity partially reversible perfusion defect of the lateral wall. These findings are suggestive of ischemia, but artifact cannot be excluded. Clinical correlation is recommended and additional testing may be necessary.  Echocardiogram 03/25/2018 Mild concentric LVH, EF normal, normal wall motion, grade 1 diastolic dysfunction, MAC, moderate MR, mild LAE, PASP 32  Past Medical History:  Diagnosis Date  . At risk for medication noncompliance   . CAD (coronary artery disease)    a. borderline disease by CT 08/2018.  . Diabetes mellitus, type 2 ([place])   . History of cervical  cancer   . Hyperlipidemia   . Hypertension   . Moderate mitral regurgitation   . Noncompliance with medication treatment due to underuse of medication   . Raynaud's phenomenon   . TN (trigeminal neuralgia)    Surgical Hx: The patient  has a past surgical history that includes Breast biopsy; Breast enhancement surgery; Total abdominal hysterectomy (1963); Cholecystectomy (N/A, 02/14/2014); ERCP (N/A, 03/08/2015); and Mass excision (Right, 12/25/2016).   Current Medications: Current Meds  Medication Sig  . amLODipine (NORVASC) 5 MG tablet Take 1 tablet (5 mg total) by mouth daily.  [name] aspirin EC 81 MG tablet Take 1 tablet (81 mg total) by mouth at bedtime.  [name] atorvastatin (LIPITOR) 80 MG tablet Take 1 tablet (80 mg total) by mouth daily.  . Blood Glucose Monitoring Suppl (ONE TOUCH ULTRA MINI) w/Device KIT USE TO CHECK BLOOD SUGAR DAILY AND AS NEEDED  . carboxymethylcellulose (REFRESH PLUS) 0.5 % SOLN Place 1 drop into both eyes 2 (two) times daily.  [name] glucose blood (ACCU-CHEK GUIDE) test strip Use as instructed  . Lancets (ONETOUCH ULTRASOFT) lancets Once daily or PRN, Dx E11.9  . lisinopril (ZESTRIL) 20 MG tablet Take 1 tablet (20 mg total) by mouth daily.  . metFORMIN (GLUCOPHAGE) 1000 MG tablet Take 1 tablet (1,000 mg total) by mouth 2 (two) times daily with a meal.  . Multiple Vitamin (MULTIVITAMIN) capsule Take 1 capsule by mouth daily.  . nitroGLYCERIN (NITROSTAT) 0.4 MG SL tablet Place 1 tablet (0.4 mg total) under the tongue every 5 (five) minutes as needed for chest pain.  . [DISCONTINUED] isosorbide mononitrate (IMDUR) 30 MG 24 hr tablet Take 1 tablet (30 mg total) by mouth daily.     Allergies:   Morphine and related   Social History   Tobacco Use  . Smoking status: Current Every Day Smoker    Packs/day: 1.50    Years: 60.00    Pack years: 90.00    Types: Cigarettes  . Smokeless tobacco: Never Used  Substance Use Topics  . Alcohol use: No  . Drug use: No     Family  Hx: The patient's family history is negative for CAD and Diabetes Mellitus II.  ROS:   Please see the history of present illness.    ROS All other systems reviewed and are negative.   EKGs/Labs/Other Test Reviewed:    EKG:  EKG is  ordered today.  The ekg ordered today demonstrates normal sinus rhythm, heart rate 69, left axis deviation, left anterior fascicular block, nonspecific ST-T wave changes, QTC 435, no change from prior tracing  Recent Labs: 07/19/2019: ALT 11 09/02/2019: BUN 19; Creatinine, Ser 1.10; Hemoglobin 16.1; Platelets 273; Potassium 4.6; Sodium 142 09/22/2019: TSH 0.78   Recent Lipid Panel Lab Results  Component Value Date/Time   CHOL 128 07/19/2019 08:33 AM   TRIG 150 (H) 07/19/2019 08:33 AM   HDL 58 07/19/2019 08:33 AM   CHOLHDL 2.2 07/19/2019 08:33 AM   CHOLHDL 2.7 03/24/2018 07:30 PM   LDLCALC 40 07/19/2019 08:33 AM   LDLDIRECT 83.0 12/21/2014 10:40 AM    Physical Exam:    VS:  BP 130/80   Pulse 69   Ht _0  (1.448 m)   Wt 97 lb 6.4 oz (44.2 kg)   BMI  21.08 kg/m     Wt Readings from Last 3 Encounters:  11/01/19 97 lb 6.4 oz (44.2 kg)  10/04/19 95 lb (43.1 kg)  09/22/19 96 lb 6.4 oz (43.7 kg)     Physical Exam  Constitutional: She is oriented to person, place, and time. She appears well-developed and well-nourished. No distress.  HENT:  Head: Normocephalic and atraumatic.  Eyes: No scleral icterus.  Neck: No JVD present. No thyromegaly present.  Cardiovascular: Normal rate and regular rhythm.  No murmur heard. Pulmonary/Chest: Effort normal. She has no rales.  Abdominal: Soft.  Musculoskeletal:        General: No edema.  Lymphadenopathy:    She has no cervical adenopathy.  Neurological: She is alert and oriented to person, place, and time.  Skin: Skin is warm and dry.  Psychiatric: She has a normal mood and affect.    ASSESSMENT & PLAN:    1. Coronary artery disease involving native coronary artery of native heart with angina  pectoris (HCC) Moderate CAD by coronary CTA 10/19. There was borderline significant stenosis in the mid LCx.  Overall, she is doing well with much less anginal symptoms.  She uses sublingual nitroglycerin on rare occasions.  Her blood pressure has been somewhat elevated.  Therefore, I believe that she could tolerate a higher dose of isosorbide.  When she gets her next pill pack refill, I will increase her dose of isosorbide to 60 mg daily.  -Increase isosorbide to 60 mg daily  -Continue amlodipine, aspirin, atorvastatin  -Follow-up in 3 months with Dr. Radford Pax or me, virtual or in person   2. Essential hypertension Fair control.  Adjust isosorbide as noted.  3. Hyperlipidemia, unspecified hyperlipidemia type LDL optimal on most recent lab work.  Continue current Rx.    4. Moderate mitral regurgitation Asymptomatic.  No obvious murmur on exam.  Continue current therapy.  5. Tobacco abuse She continues to smoke and does not really have a plan to quit.    Dispo:  Return in about 3 months (around 01/30/2020) for Routine Follow Up with Dr. Radford Pax, or Richardson Dopp, PA-C, (virtual or in-person).   Medication Adjustments/Labs and Tests Ordered: Current medicines are reviewed at length with the patient today.  Concerns regarding medicines are outlined above.  Tests Ordered: Orders Placed This Encounter  Procedures  . EKG 12-Lead   Medication Changes: Meds ordered this encounter  Medications  . isosorbide mononitrate (IMDUR) 60 MG 24 hr tablet    Sig: Take 1 tablet (60 mg total) by mouth daily.    Dispense:  90 tablet    Refill:  3    Signed, Richardson Dopp, PA-C  11/01/2019 [facility] [address], [place], [place]  [address] Phone: [phone]; Fax: [phone]

## 2019-11-01 ENCOUNTER — Ambulatory Visit (INDEPENDENT_AMBULATORY_CARE_PROVIDER_SITE_OTHER): Payer: PPO | Admitting: Physician Assistant

## 2019-11-01 ENCOUNTER — Other Ambulatory Visit: Payer: Self-pay

## 2019-11-01 ENCOUNTER — Encounter: Payer: Self-pay | Admitting: Physician Assistant

## 2019-11-01 VITALS — BP 130/80 | HR 69 | Ht <= 58 in | Wt 97.4 lb

## 2019-11-01 DIAGNOSIS — I340 Nonrheumatic mitral (valve) insufficiency: Secondary | ICD-10-CM

## 2019-11-01 DIAGNOSIS — I25119 Atherosclerotic heart disease of native coronary artery with unspecified angina pectoris: Secondary | ICD-10-CM

## 2019-11-01 DIAGNOSIS — E785 Hyperlipidemia, unspecified: Secondary | ICD-10-CM

## 2019-11-01 DIAGNOSIS — Z720 Tobacco use: Secondary | ICD-10-CM | POA: Diagnosis not present

## 2019-11-01 DIAGNOSIS — I10 Essential (primary) hypertension: Secondary | ICD-10-CM

## 2019-11-01 MED ORDER — ISOSORBIDE MONONITRATE ER 60 MG PO TB24: 60.0000 mg | ORAL_TABLET | Freq: Every day | ORAL | 3 refills | Status: DC

## 2019-11-01 NOTE — Patient Instructions (Signed)
Medication Instructions:   Your physician has recommended you make the following change in your medication:   1) Increase your Imdur to 60MG , 1 tablet by mouth once a day  *If you need a refill on your cardiac medications before your next appointment, please call your pharmacy*  Lab Work:  None ordered today  Testing/Procedures:  None ordered today  Follow-Up:  On 02/07/2020 with Richardson Dopp, PA-C at 11:45AM, this will be a virtual appointment.

## 2019-11-02 NOTE — Progress Notes (Signed)
Home Visit on behalf of [facility] on 12/7:  Arrived at Ms. Nattaly's house around 11:15 in time for the virtual visit w/[facility] @ 11:45, but her car wasn't in driveway.  Called her and she was at the [facility] office.  Agreed to meet her at the office in order to do the virtual visit w/her in her vehicle so she wouldn't rush home since visit would happen sooner than she could arrive back home.   [facility] decided to see her in the office and the writer accompanied her.  Her "brother" Hie was waiting in the lobby for her to finish.  Son, [name] was in on the visit by speaker phone. VS & EKG taken by [facility] staff.  Richardson Dopp, PA was provider that saw her. After the visit, went back to her home to check CBG and check medication compliance and to make sure her metformin dose is 1000mg  BID.   CBG 114.  Son, [name] was updated and has made sure her meds are cancelled w/[facility] and to be refilled by [facility] from here on out since they fill bubble packs, deliver, and have instructions in [place].  W/next refill, in one month (first week in Jan 2021) the isosorbide will be increased from 30mg  to 60mg  in hopes she won't have to use NTG anymore.   Will revisit her on Friday 12/11.

## 2019-11-07 NOTE — Progress Notes (Signed)
11/05/19: Home Visit to check Med Compliance.  There are still some doses she has missed.  When asked if she took her meds this am, she answers "yes" and points to the empty area for the previous day.  VS taken and will be documented after the next visit on Monday 12/14. She stated she feels pretty good and denies having to use nitroglycerine this past week. States she will be going out shopping after the home visit.

## 2019-11-08 NOTE — Progress Notes (Signed)
Home Visit to check Med Compliance on 12/14: [name] still seems to miss some dosages of medications and can't remember what she took and when.  Her compliance has improved, however, since the med pill packs have been used.  The current med pack has Sunday's AM dose and Saturday's AM & PM doses left.  Today is Monday & visit time was around 3pm.  She said she took her meds this am but couldn't say from which "pouch" she took them from.   Her new medication pill packs from [facility] were delivered at some point since last visit on Friday.  She did much better with the format they use w/their pill packs.  We put her old pill packs into her storage pantry and she agreed to start using the new ones that [facility] delivered.  Will update her son, HPOA.   CBG during visit 132.   Denies having to use nitroglycerine since previous visit.  Stated she felt fine and has been doing fine.

## 2019-11-11 ENCOUNTER — Telehealth: Payer: Self-pay

## 2019-11-14 NOTE — Progress Notes (Signed)
Home Visit on 12/18 to observe medication adherence and check vital signs.   On observation of her weekly, pre-filled by the pharmacy pill pack, she had AM & PM doses left for Fri-Mon.  AM doses for Tue, Wed taken, and Thurs AM & PM have been taken.  Today is Friday.  She said she took her meds this am, but couldn't verify from which pouch she took them from.   VS: 153/84, 84, 18, CBG 139.  Second check: 146/73, 75, 18. She stated she felt good but did c/o her memory being bad, as she does each visit.  Denied having to use any nitroglycerin.  Her brother Hie was there to have lunch with her.

## 2019-11-30 ENCOUNTER — Other Ambulatory Visit: Payer: Self-pay

## 2019-11-30 ENCOUNTER — Telehealth: Payer: Self-pay | Admitting: Neurology

## 2019-11-30 ENCOUNTER — Ambulatory Visit (INDEPENDENT_AMBULATORY_CARE_PROVIDER_SITE_OTHER): Payer: PPO | Admitting: Neurology

## 2019-11-30 ENCOUNTER — Encounter: Payer: Self-pay | Admitting: Neurology

## 2019-11-30 VITALS — BP 171/77 | HR 90 | Temp 97.7°F | Ht 60.0 in | Wt 97.0 lb

## 2019-11-30 DIAGNOSIS — F0390 Unspecified dementia without behavioral disturbance: Secondary | ICD-10-CM

## 2019-11-30 DIAGNOSIS — F03A0 Unspecified dementia, mild, without behavioral disturbance, psychotic disturbance, mood disturbance, and anxiety: Secondary | ICD-10-CM

## 2019-11-30 MED ORDER — DONEPEZIL HCL 10 MG PO TABS: ORAL_TABLET | ORAL | 11 refills | Status: DC

## 2019-11-30 NOTE — Patient Instructions (Signed)
1. Schedule MRI brain without contrast  2. Start Donepezil (Aricept) 10mg : take 1/2 tablet daily for 2 weeks, then increase to 1 tablet daily  3. Recommend starting to have daily help come for medications  4. Continue to monitor driving  5. Follow-up in 6 months, call for any changes  FALL PRECAUTIONS: Be cautious when walking. Scan the area for obstacles that may increase the risk of trips and falls. When getting up in the mornings, sit up at the edge of the bed for a few minutes before getting out of bed. Consider elevating the bed at the head end to avoid drop of blood pressure when getting up. Walk always in a well-lit room (use night lights in the walls). Avoid area rugs or power cords from appliances in the middle of the walkways. Use a walker or a cane if necessary and consider physical therapy for balance exercise. Get your eyesight checked regularly.  FINANCIAL OVERSIGHT: Supervision, especially oversight when making financial decisions or transactions is also recommended.  HOME SAFETY: Consider the safety of the kitchen when operating appliances like stoves, microwave oven, and blender. Consider having supervision and share cooking responsibilities until no longer able to participate in those. Accidents with firearms and other hazards in the house should be identified and addressed as well.  DRIVING: Regarding driving, in patients with progressive memory problems, driving will be impaired. We advise to have someone else do the driving if trouble finding directions or if minor accidents are reported. Independent driving assessment is available to determine safety of driving.  ABILITY TO BE LEFT ALONE: If patient is unable to contact 911 operator, consider using LifeLine, or when the need is there, arrange for someone to stay with patients. Smoking is a fire hazard, consider supervision or cessation. Risk of wandering should be assessed by caregiver and if detected at any point, supervision  and safe proof recommendations should be instituted.  MEDICATION SUPERVISION: Inability to self-administer medication needs to be constantly addressed. Implement a mechanism to ensure safe administration of the medications.  RECOMMENDATIONS FOR ALL PATIENTS WITH MEMORY PROBLEMS: 1. Continue to exercise (Recommend 30 minutes of walking everyday, or 3 hours every week) 2. Increase social interactions - continue going to [place] and enjoy social gatherings with friends and family 3. Eat healthy, avoid fried foods and eat more fruits and vegetables 4. Maintain adequate blood pressure, blood sugar, and blood cholesterol level. Reducing the risk of stroke and cardiovascular disease also helps promoting better memory. 5. Avoid stressful situations. Live a simple life and avoid aggravations. Organize your time and prepare for the next day in anticipation. 6. Sleep well, avoid any interruptions of sleep and avoid any distractions in the bedroom that may interfere with adequate sleep quality 7. Avoid sugar, avoid sweets as there is a strong link between excessive sugar intake, diabetes, and cognitive impairment The Mediterranean diet has been shown to help patients reduce the risk of progressive memory disorders and reduces cardiovascular risk. This includes eating fish, eat fruits and green leafy vegetables, nuts like almonds and hazelnuts, walnuts, and also use olive oil. Avoid fast foods and fried foods as much as possible. Avoid sweets and sugar as sugar use has been linked to worsening of memory function.  There is always a concern of gradual progression of memory problems. If this is the case, then we may need to adjust level of care according to patient needs. Support, both to the patient and caregiver, should then be put into place.

## 2019-11-30 NOTE — Progress Notes (Signed)
NEUROLOGY CONSULTATION NOTE  [name] MRN: [id number] DOB: [birth year]  Referring provider: Dr. Lelon Frohlich Primary care provider: Dr. Lelon Frohlich  Reason for consult:  Memory loss  Dear Dr. Isaac Bliss:  Thank you for your kind referral of [name] for consultation of the above symptoms. Although her history is well known to you, please allow me to reiterate it for the purpose of our medical record. Her son [name] was present on teleconference on video to provide additional information. Records and images were personally reviewed where available.   HISTORY OF PRESENT ILLNESS: This is a pleasant 86 year old right-handed woman with a history of hypertension, hyperlipidemia, diabetes, cervical cancer, trigeminal neuralgia, presenting for evaluation of memory loss. She feels her memory changed 2-3 years ago where she has difficulty remembering names. Family started noticing changes in the past year. She lives alone and denies getting lost driving. She is very active with the [place] [employer] and visits daily to help them, praying every Sunday. Her son [name] reports she forgot how to get to the temple a few months ago. She denies missing bill payments. [name] reports her husband had managed the bills until he passed away 3 years ago. [name] has noticed she would not recall who she wrote checks for, and would not be aware of how much money is in her account, causing overdraws. She denies misplacing things, however [name] reports that they tried to return clothes last month and they could not find the card she used. She eventually found the card, but [name] found out she had opened up another new credit card account. [name] had expressed concern about medication compliance, her last HbA1c was 8.5. Medications were switched to a pillpack, which has helped, however she has a visiting nurse come twice a week who notes that she would have left over doses on the  pillpack. She would not be able to verify which pouch she took them from. Family also noticed personality changes last Spring, she seemed to zone out during her granddaughter's graduation. She is usually bubbly and very social, they noticed she was less active, falling asleep and more tired. She is independent with dressing and bathing, no hygiene concerns. She has 2 dogs. She states her mood is okay and sleep is very good. There is no family history of dementia, no history of significant head injuries or alcohol use.  She denies any headaches, dizziness, dysarthria, dysphagia, neck/back pain, focal numbness/tingling/weakness, bowel/bladder dysfunction. No anosmia, tremors, no falls. Vision is sometimes blurred.  Laboratory Data: Lab Results  Component Value Date   TSH 0.78 09/22/2019   Lab Results  Component Value Date   VITAMINB12 1,032 (H) 09/22/2019     PAST MEDICAL HISTORY: Past Medical History:  Diagnosis Date  . At risk for medication noncompliance   . CAD (coronary artery disease)    a. borderline disease by CT 08/2018.  . Diabetes mellitus, type 2 ([place])   . History of cervical cancer   . Hyperlipidemia   . Hypertension   . Moderate mitral regurgitation   . Noncompliance with medication treatment due to underuse of medication   . Raynaud's phenomenon   . TN (trigeminal neuralgia)     PAST SURGICAL HISTORY: Past Surgical History:  Procedure Laterality Date  . BREAST BIOPSY    . BREAST ENHANCEMENT SURGERY    . CHOLECYSTECTOMY N/A 02/14/2014   Procedure: LAPAROSCOPIC CHOLECYSTECTOMY;  Surgeon: Ralene Ok, MD;  Location: [facility];  Service: General;  Laterality:  N/A;  . ERCP N/A 03/08/2015   Procedure: ENDOSCOPIC RETROGRADE CHOLANGIOPANCREATOGRAPHY (ERCP);  Surgeon: Ladene Artist, MD;  Location: [facility];  Service: Endoscopy;  Laterality: N/A;  . MASS EXCISION Right 12/25/2016   Procedure: EXCISION RIGHT SCALP MASS SEBACEOUS CYST;  Surgeon: Coralie Keens, MD;   Location: [facility];  Service: General;  Laterality: Right;  . TOTAL ABDOMINAL HYSTERECTOMY  1963   for cervical cancer in situ    MEDICATIONS: Current Outpatient Medications on File Prior to Visit  Medication Sig Dispense Refill  . amLODipine (NORVASC) 5 MG tablet Take 1 tablet (5 mg total) by mouth daily. 90 tablet 3  . aspirin EC 81 MG tablet Take 1 tablet (81 mg total) by mouth at bedtime. 90 tablet 3  . atorvastatin (LIPITOR) 80 MG tablet Take 1 tablet (80 mg total) by mouth daily. 90 tablet 3  . Blood Glucose Monitoring Suppl (ONE TOUCH ULTRA MINI) w/Device KIT USE TO CHECK BLOOD SUGAR DAILY AND AS NEEDED 1 each 0  . carboxymethylcellulose (REFRESH PLUS) 0.5 % SOLN Place 1 drop into both eyes 2 (two) times daily.    [name] glucose blood (ACCU-CHEK GUIDE) test strip Use as instructed 100 each 12  . isosorbide mononitrate (IMDUR) 60 MG 24 hr tablet Take 1 tablet (60 mg total) by mouth daily. 90 tablet 3  . Lancets (ONETOUCH ULTRASOFT) lancets Once daily or PRN, Dx E11.9 100 each 12  . lisinopril (ZESTRIL) 20 MG tablet Take 1 tablet (20 mg total) by mouth daily. 90 tablet 3  . metFORMIN (GLUCOPHAGE) 1000 MG tablet Take 1 tablet (1,000 mg total) by mouth 2 (two) times daily with a meal. 60 tablet 6  . Multiple Vitamin (MULTIVITAMIN) capsule Take 1 capsule by mouth daily. 90 capsule 3  . Multiple Vitamins-Minerals (ONE DAILY PLUS MINERALS) TABS Take by mouth every morning.    . nitroGLYCERIN (NITROSTAT) 0.4 MG SL tablet Place 1 tablet (0.4 mg total) under the tongue every 5 (five) minutes as needed for chest pain. 50 tablet 3   No current facility-administered medications on file prior to visit.    ALLERGIES: Allergies  Allergen Reactions  . Morphine And Related Itching    Short lived, mild itching.    FAMILY HISTORY: Family History  Problem Relation Age of Onset  . CAD Neg Hx   . Diabetes Mellitus II Neg Hx     SOCIAL HISTORY: Social History   Socioeconomic History  . Marital  status: Widowed    Spouse name: [name]  . Number of children: 1  . Years of education: Not on file  . Highest education level: Not on file  Occupational History  . 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 Smoking status: Current Every Day Smoker    Packs/day: 1.50    Years: 60.00    Pack years: 90.00    Types: Cigarettes  . Smokeless tobacco: Never Used  Substance and Sexual Activity  . Alcohol use: No  . Drug use: No  . Sexual activity: Not on file  Other Topics Concern  . Not on file  Social History Narrative   Married.  Lives in [place] with her husband.  No FH of heart disease.   1 adult son   + smoker   No EtOH   Social Determinants of Health   Financial Resource Strain:   . Difficulty of Paying Living Expenses: Not on file  Food Insecurity:   . Worried About [employer] of  Food in the Last Year: Not on file  . Ran Out of Food in the Last Year: Not on file  Transportation Needs:   . Lack of Transportation (Medical): Not on file  . Lack of Transportation (Non-Medical): Not on file  Physical Activity:   . Days of Exercise per Week: Not on file  . Minutes of Exercise per Session: Not on file  Stress:   . Feeling of Stress : Not on file  Social Connections:   . Frequency of Communication with Friends and Family: Not on file  . Frequency of Social Gatherings with Friends and Family: Not on file  . Attends Religious Services: Not on file  . Active Member of Clubs or Organizations: Not on file  . Attends Archivist Meetings: Not on file  . Marital Status: Not on file  Intimate Partner Violence:   . Fear of Current or Ex-Partner: Not on file  . Emotionally Abused: Not on file  . Physically Abused: Not on file  . Sexually Abused: Not on file    REVIEW OF SYSTEMS: Constitutional: No fevers, chills, or sweats, no generalized fatigue, change in appetite Eyes: No visual changes, double vision, eye pain Ear, nose and throat: No  hearing loss, ear pain, nasal congestion, sore throat Cardiovascular: No chest pain, palpitations Respiratory:  No shortness of breath at rest or with exertion, wheezes GastrointestinaI: No nausea, vomiting, diarrhea, abdominal pain, fecal incontinence Genitourinary:  No dysuria, urinary retention or frequency Musculoskeletal:  No neck pain, back pain Integumentary: No rash, pruritus, skin lesions Neurological: as above Psychiatric: No depression, insomnia, anxiety Endocrine: No palpitations, fatigue, diaphoresis, mood swings, change in appetite, change in weight, increased thirst Hematologic/Lymphatic:  No anemia, purpura, petechiae. Allergic/Immunologic: no itchy/runny eyes, nasal congestion, recent allergic reactions, rashes  PHYSICAL EXAM: Vitals:   11/30/19 1020  BP: (!) 171/77  Pulse: 90  Temp: 97.7 F (36.5 C)  SpO2: 100%   General: No acute distress Head:  Normocephalic/atraumatic Skin/Extremities: No rash, no edema Neurological Exam: Mental status: alert and oriented to person, place, and time, no dysarthria or aphasia, Fund of knowledge is appropriate.  Recent and remote memory are impaired.  Attention and concentration are reduced. SLUMS 13/30 [employer] Mental Exam 11/30/2019  Weekday Correct 1  Current year 1  What state are we in? 1  Amount spent 1  Amount left 2  # of Animals 0  5 objects recall 0  Number series 1  Hour markers 2  Time correct 2  Placed X in triangle correctly 1  Largest Figure 1  Name of female 0  Date back to work 0  Type of work 0  State she lived in 0  Total score 13   Cranial nerves: CN I: not tested CN II: pupils equal, round and reactive to light, visual fields intact CN III, IV, VI:  full range of motion, no nystagmus, no ptosis CN V: facial sensation intact CN VII: upper and lower face symmetric CN VIII: hearing intact to conversation CN IX, X: gag intact, uvula midline CN XI: sternocleidomastoid and trapezius  muscles intact CN XII: tongue midline Bulk & Tone: normal, no fasciculations. Motor: 5/5 throughout with no pronator drift. Sensation: intact to light touch, cold, pin, vibration and joint position sense.  No extinction to double simultaneous stimulation.  Romberg test negative Deep Tendon Reflexes: +2 throughout, no ankle clonus Plantar responses: downgoing bilaterally Cerebellar: no incoordination on finger to nose testing Gait: narrow-based and steady, able to tandem  walk adequately. Tremor: none  IMPRESSION: This is a pleasant 86 year old right-handed woman with a history of hypertension, hyperlipidemia, diabetes, cervical cancer, trigeminal neuralgia, presenting for evaluation of memory loss. Her neurological exam is non-focal, SLUMS score 13/30. She is having more difficulty with complex tasks, particularly medication management. We discussed the diagnosis of dementia, etiology Alzheimer's versus vascular. MRI brain without contrast will be ordered to assess for underlying structural abnormality and assess vascular load. We discussed starting Donepezil, including side effects and expectations. Start Donepezil 49m 1/2 tablet daily for 2 weeks, then increase to 1 tablet daily. We discussed the need for more assistance at home, particularly with ensuring medication compliance. They will have family close by start helping more. We discussed driving, continue to monitor, if more issues, would stop driving. We discussed the importance of control of vascular risk factors, physical exercise, and brain stimulation exercises for brain health. Follow-up in 6 months, they know to call for any changes.   Thank you for allowing me to participate in the care of this patient. Please do not hesitate to call for any questions or concerns.   KEllouise Newer M.D.  CC: Dr. HIsaac Bliss

## 2019-11-30 NOTE — Telephone Encounter (Signed)
Patient's nurse called and said the patient uses pill packs to avoid skipping doses of medication. She said Dr. Delice Lesch started her on Aricept 10 MG to take one half daily for the first two weeks then one tablet daily after that.   Nurse is requesting a call to the pharmacy below so they can set the prescription up for pill packs in the next delivery to include the new medication.  [facility] [phone]

## 2019-12-01 ENCOUNTER — Telehealth: Payer: Self-pay

## 2019-12-01 NOTE — Telephone Encounter (Signed)
Tara Casco, RN with [facility] called. [name] states that she went to see patient yesterday for home health. [name] states that there was 2 weeks of medication at the patients home and when she called [employer], they stated that she only have 3 days left. [name] has tried to reach the patients son, he did not answer. She talked with Jackelyn Poling who was the nurse taking care of her; Jackelyn Poling and [name] feel like patient is not taking medication. Her blood pressure was 179/80 yesterday around 1PM, pulse was 71, patient was afebrile. [name] feels like she needs someone there daily to make sure she is taking her medications, she will continue to try to reach out to patients son about this. This call was just to update [place] on what was going on, they can continue to go out there, but it will only be once every other week. She will be trying to reach her son again today.

## 2019-12-01 NOTE — Telephone Encounter (Signed)
Yes, she needs more regular assistance with medications. I will fwd this to Raquel Sarna, LCSW.  She has assisted with this patient in the past. Kennyth Lose, do you know of any other options to help her with medication compliance (?nursing aid to come out twice daily to make sure meds are taken correctly?). Richardson Dopp, PA-C    12/01/2019 1:19 PM

## 2019-12-02 ENCOUNTER — Telehealth: Payer: Self-pay | Admitting: Licensed Clinical Social Worker

## 2019-12-02 NOTE — Patient Outreach (Signed)
[name] has [employer] insurance. Following their workflow I have sent it to [email or web address] for Care Management.   Thank you

## 2019-12-02 NOTE — Telephone Encounter (Signed)
CSW received referral to assist patient with medication compliance. The request was for 2x daily visit from aide/healthcare personnel to visit patient at home to assist with reminders. It is unlikely that insurance will cover those visits. CSW contacted patient's son who reports that he thinks that possibly "some of her older medications from when she was switched to the pill packs" along with some missed does. Son reports he plans to contact patient twice daily to assist with medication reminders. CSW will contact [facility] RN to share conversation with son and ask for RN to contact son during next visit to review meds. Son also asked about options for patient who lives alone and has increasing dementia. Son would like to know his options for long term care. CSW will refer to [facility] for further assistance. CSW continues to follow up as needed. Raquel Sarna, [place], [place]

## 2019-12-03 ENCOUNTER — Telehealth: Payer: Self-pay | Admitting: Licensed Clinical Social Worker

## 2019-12-03 NOTE — Telephone Encounter (Signed)
CSW discussed case with Jenny Reichmann RN from [facility] and shared concerns from conversation with son. Jenny Reichmann will make arrangements for an [facility] RN to visit on Monday and review all medications and Facetime son as he thinks it would be helpful to be apart of the visit. CSW contacted son to confirm plan. CSW available as needed. Raquel Sarna, [place], [place]

## 2019-12-07 ENCOUNTER — Encounter: Payer: Self-pay | Admitting: Neurology

## 2019-12-08 ENCOUNTER — Other Ambulatory Visit: Payer: Self-pay | Admitting: Physician Assistant

## 2019-12-09 ENCOUNTER — Other Ambulatory Visit: Payer: Self-pay

## 2019-12-09 MED ORDER — DONEPEZIL HCL 10 MG PO TABS: ORAL_TABLET | ORAL | 11 refills | Status: DC

## 2019-12-09 NOTE — Telephone Encounter (Signed)
Has this been completed?  Sending to clinical staff for review: Okay to sign/close encounter or is further follow up needed? 

## 2019-12-09 NOTE — Telephone Encounter (Signed)
[facility] contacted and instructed to use a pill pack with next refill.

## 2019-12-16 ENCOUNTER — Other Ambulatory Visit: Payer: Self-pay

## 2019-12-16 ENCOUNTER — Ambulatory Visit
Admission: RE | Admit: 2019-12-16 | Discharge: 2019-12-16 | Disposition: A | Discharge status: Home / Self Care | Payer: PPO | Source: Ambulatory Visit | Attending: Neurology | Admitting: Neurology

## 2019-12-16 DIAGNOSIS — F03A0 Unspecified dementia, mild, without behavioral disturbance, psychotic disturbance, mood disturbance, and anxiety: Secondary | ICD-10-CM

## 2019-12-16 DIAGNOSIS — F0390 Unspecified dementia without behavioral disturbance: Secondary | ICD-10-CM

## 2019-12-16 DIAGNOSIS — R413 Other amnesia: Secondary | ICD-10-CM | POA: Diagnosis not present

## 2019-12-17 ENCOUNTER — Telehealth: Payer: Self-pay

## 2019-12-17 NOTE — Telephone Encounter (Signed)
Spoke with PT informed her MRI brain did not show any evidence of tumor, stroke, or bleed. It did show brain shrinkage consistent with our discussion about dementia.   If her son calls, pls let him know there was not a lot of hardening of blood vessels, indicating dementia likely due to Alzheimer's disease

## 2019-12-22 ENCOUNTER — Other Ambulatory Visit: Payer: Self-pay

## 2019-12-22 ENCOUNTER — Telehealth (INDEPENDENT_AMBULATORY_CARE_PROVIDER_SITE_OTHER): Payer: PPO | Admitting: Internal Medicine

## 2019-12-22 DIAGNOSIS — E785 Hyperlipidemia, unspecified: Secondary | ICD-10-CM | POA: Diagnosis not present

## 2019-12-22 DIAGNOSIS — G301 Alzheimer's disease with late onset: Secondary | ICD-10-CM | POA: Diagnosis not present

## 2019-12-22 DIAGNOSIS — E119 Type 2 diabetes mellitus without complications: Secondary | ICD-10-CM | POA: Diagnosis not present

## 2019-12-22 DIAGNOSIS — I10 Essential (primary) hypertension: Secondary | ICD-10-CM | POA: Diagnosis not present

## 2019-12-22 DIAGNOSIS — F0280 Dementia in other diseases classified elsewhere without behavioral disturbance: Secondary | ICD-10-CM

## 2019-12-22 NOTE — Progress Notes (Signed)
Virtual Visit via Telephone Note  I connected with [name] on 12/22/19 at  2:15 PM EST by telephone and verified that I am speaking with the correct person using two identifiers.   I discussed the limitations, risks, security and privacy concerns of performing an evaluation and management service by telephone and the availability of in person appointments. I also discussed with the patient that there may be a patient responsible charge related to this service. The patient expressed understanding and agreed to proceed.  Location patient: home Location provider: work office Participants present for the call: patient, provider, son [name] via three way call Patient did not have a visit in the prior 7 days to address this/these issue(s).   History of Present Illness:  3 month follow up chronic conditions.  1. CBGs have been in the 120-150 range.  2. BP has been mostly well controlled.  3. She has seen neurology with a SLUMS score of 13 and was started on donepezil. Several reports from [facility] that she still has remaining pills in her pill pack. Son wanted me to review recent brain MRI results with him.  She feels like she is doing well and has no complaints other than her appetite being slightly decreased. No weight loss that she is aware of.   Observations/Objective: Patient sounds cheerful and well on the phone. I do not appreciate any increased work of breathing. Speech and thought processing are grossly intact. Patient reported vitals: none reported   Current Outpatient Medications:  .  amLODipine (NORVASC) 5 MG tablet, Take 1 tablet (5 mg total) by mouth daily., Disp: 90 tablet, Rfl: 3 .  aspirin EC 81 MG tablet, Take 1 tablet (81 mg total) by mouth at bedtime., Disp: 90 tablet, Rfl: 3 .  atorvastatin (LIPITOR) 80 MG tablet, Take 1 tablet (80 mg total) by mouth daily., Disp: 90 tablet, Rfl: 3 .  Blood Glucose Monitoring Suppl (ONE TOUCH ULTRA MINI) w/Device KIT,  USE TO CHECK BLOOD SUGAR DAILY AND AS NEEDED, Disp: 1 each, Rfl: 0 .  carboxymethylcellulose (REFRESH PLUS) 0.5 % SOLN, Place 1 drop into both eyes 2 (two) times daily., Disp: , Rfl:  .  donepezil (ARICEPT) 10 MG tablet, Take 1/2 tablet daily for 2 weeks, then increase to 1 tablet daily and continue, Disp: 30 tablet, Rfl: 11 .  glucose blood (ACCU-CHEK GUIDE) test strip, Use as instructed, Disp: 100 each, Rfl: 12 .  isosorbide mononitrate (IMDUR) 60 MG 24 hr tablet, Take 1 tablet (60 mg total) by mouth daily., Disp: 90 tablet, Rfl: 3 .  Lancets (ONETOUCH ULTRASOFT) lancets, Once daily or PRN, Dx E11.9, Disp: 100 each, Rfl: 12 .  lisinopril (ZESTRIL) 20 MG tablet, Take 1 tablet (20 mg total) by mouth daily., Disp: 90 tablet, Rfl: 3 .  metFORMIN (GLUCOPHAGE) 1000 MG tablet, Take 1 tablet (1,000 mg total) by mouth 2 (two) times daily with a meal., Disp: 60 tablet, Rfl: 6 .  Multiple Vitamin (MULTIVITAMIN) capsule, Take 1 capsule by mouth daily., Disp: 90 capsule, Rfl: 3 .  Multiple Vitamins-Minerals (ONE DAILY PLUS MINERALS) TABS, Take by mouth every morning., Disp: , Rfl:  .  nitroGLYCERIN (NITROSTAT) 0.4 MG SL tablet, PLACE 1 TABLET BY MOUTH UNDER THE TONGUE EVERY 5 MINUTES AS NEEDED FOR CHEST PAIN, Disp: 50 tablet, Rfl: 3  Review of Systems:  Constitutional: Denies fever, chills, diaphoresis, appetite change and fatigue.  HEENT: Denies photophobia, eye pain, redness, hearing loss, ear pain, congestion, sore throat, rhinorrhea, sneezing,  mouth sores, trouble swallowing, neck pain, neck stiffness and tinnitus.   Respiratory: Denies SOB, DOE, cough, chest tightness,  and wheezing.   Cardiovascular: Denies chest pain, palpitations and leg swelling.  Gastrointestinal: Denies nausea, vomiting, abdominal pain, diarrhea, constipation, blood in stool and abdominal distention.  Genitourinary: Denies dysuria, urgency, frequency, hematuria, flank pain and difficulty urinating.  Endocrine: Denies: hot or cold  intolerance, sweats, changes in hair or nails, polyuria, polydipsia. Musculoskeletal: Denies myalgias, back pain, joint swelling, arthralgias and gait problem.  Skin: Denies pallor, rash and wound.  Neurological: Denies dizziness, seizures, syncope, weakness, light-headedness, numbness and headaches.  Hematological: Denies adenopathy. Easy bruising, personal or family bleeding history  Psychiatric/Behavioral: Denies suicidal ideation, mood changes, confusion, nervousness, sleep disturbance and agitation   Assessment and Plan:  Late onset Alzheimer's disease without behavioral disturbance ([place]) -Followed by neurology. -MRI brain with no acute changes, cortical atrophy and white matter disease. -No side effects reported on donepezil.  Dyslipidemia -LDL 40 in 8/20.  Essential hypertension -Per patient, mostly well controlled.  Diabetes mellitus type 2 in nonobese ([facility]) -CBGs appear to be mostly within range per report, altho unclear how accurate given her cognitive decline. -Next appointment should be in office if possible to check A1c.    I discussed the assessment and treatment plan with the patient. The patient was provided an opportunity to ask questions and all were answered. The patient agreed with the plan and demonstrated an understanding of the instructions.   The patient was advised to call back or seek an in-person evaluation if the symptoms worsen or if the condition fails to improve as anticipated.  I provided 23 minutes of non-face-to-face time during this encounter.   Lelon Frohlich, MD [facility]

## 2020-01-03 ENCOUNTER — Telehealth: Payer: Self-pay | Admitting: Internal Medicine

## 2020-01-03 NOTE — Telephone Encounter (Signed)
Lovena Le from [place] and want to talk with Apolonio Schneiders about [name]

## 2020-01-04 NOTE — Telephone Encounter (Signed)
Can not speak without ROI

## 2020-01-06 ENCOUNTER — Other Ambulatory Visit: Payer: Self-pay | Admitting: *Deleted

## 2020-01-07 ENCOUNTER — Telehealth: Payer: Self-pay | Admitting: Cardiology

## 2020-01-07 ENCOUNTER — Encounter: Payer: Self-pay | Admitting: *Deleted

## 2020-01-07 NOTE — Telephone Encounter (Signed)
Follow Up:   [name] please call, she wants to give yopu an update on what is going on with the patient please.

## 2020-01-07 NOTE — Patient Outreach (Signed)
HTA Request to message primary care provider to consider having goals of care with [name]. Pt noted to be living alone, has a dx of Alzheimer's and noted to still be driving. Safety for this patient is of concern.  I have messaged Dr. Isaac Bliss today to consider this request.  Eulah Pont. Myrtie Neither, MSN, [facility] Gerontological Nurse Practitioner [facility] Care Management [phone]

## 2020-01-07 NOTE — Telephone Encounter (Signed)
Left message to call back  

## 2020-01-07 NOTE — Telephone Encounter (Signed)
Left message for [facility] at [facility].

## 2020-01-07 NOTE — Telephone Encounter (Signed)
Lovena Le, with [employer], is calling requesting to speak with Richardson Dopp in regards to a longterm care discussion. Please return call to [facility] at [phone] to discuss.

## 2020-01-10 ENCOUNTER — Telehealth: Payer: Self-pay | Admitting: Physician Assistant

## 2020-01-10 NOTE — Telephone Encounter (Signed)
Looks like referral is under [employer], PAC.

## 2020-01-10 NOTE — Telephone Encounter (Signed)
New Message   Lovena Le with [employer] is calling to provide update on referral that they received for the patient. She states that they are waiting to hear back from the patients son because the patient has a long term insurance plan. So they need to make sure of the coverage before scheduling the patient. She said she will be out of the office until 3pm.

## 2020-01-13 NOTE — Telephone Encounter (Signed)
I spoke with Lovena Le from [facility].  Patient is eligible for Long Term Care insurance but is not aware of what her policy allows.  The patient's son is to help secure the policy so it can be reviewed.  The patient's PCP has agreed to to have a Goals of Care discussion with the patient and her son.  Currently, the patient's son does not feel that the patient needs long term care. As more information comes available, Lovena Le will contact me back for updates. Richardson Dopp, PA-C    01/13/2020 11:08 AM

## 2020-01-13 NOTE — Telephone Encounter (Signed)
See separate phone note. Richardson Dopp, PA-C    01/13/2020 11:09 AM

## 2020-02-06 NOTE — Progress Notes (Signed)
Virtual Visit via Telephone Note   This visit type was conducted due to national recommendations for restrictions regarding the COVID-19 Pandemic (e.g. social distancing) in an effort to limit this patient's exposure and mitigate transmission in our community.  Due to her co-morbid illnesses, this patient is at least at moderate risk for complications without adequate follow up.  This format is felt to be most appropriate for this patient at this time.  The patient did not have access to video technology/had technical difficulties with video requiring transitioning to audio format only (telephone).  All issues noted in this document were discussed and addressed.  No physical exam could be performed with this format.  Please refer to the patient's chart for her  consent to telehealth for [facility].   The patient was identified using 2 identifiers.  Date:  02/07/2020   ID:  [name], DOB [birth year], MRN [id number]  Patient Location: Home Provider Location: Office  PCP:  Isaac Bliss, Rayford Halsted, MD  Cardiologist:  [name], MD   Electrophysiologist:  None   Evaluation Performed:  Follow-Up Visit  Chief Complaint:  CAD   History of Present Illness:    [name] is a 86 y.o. female with    Coronary artery disease ? Myoview 5/19 with anteroseptal and lateral ischemia ? CTA 10/19: Coronary calcium 662, moderate CAD in the LAD and LCx with borderline significance in the LCx by FFR>>medical therapy unless significant symptoms  Mitral regurgitation ? Moderate by echo 5/19  Carotid artery disease  Diabetes mellitus  Hypertension  Hyperlipidemia  Raynaud's disease  History of elevated LFTs with dilated common bile duct  Tobacco abuse  Trigeminal neuralgia  Medicationnon adherence  Dementia  Prior Cardiac Studies Coronary CTA 09/21/2018 IMPRESSION: 1. Coronary calcium score of 662. This was 46 percentile for age and sex  matched control.  2. Normal coronary origin with right dominance.  3. Moderate CAD in the proximal and mid LAD and proximal and mid LCX arteries. Additional analysis with CT FFR will be submitted.  4. Severe mitral annular calcifications. FFR 1. Left Main: No significant stenosis.  2. LAD: Proximal: 0.92, distal: 0.80. 3. D1: 0.81. 4. LCX: Proximal: 0.98, distal: 0.78. 5. RCA: Proximal: 0.98, distal: 0.85. 6. PDA: 0.81.  IMPRESSION: 1. CT FFR analysis showed stenosis in the mid portion of a non-dominant LCX artery. A medical management would be recommended unless there are significant symptoms.  Carotid US 07/31/2018 Bilateral ICA 1-39  Myoview 04/15/2018 Large size, moderate severity partially reversible anteroseptal and septal perfusion defect and a medium size, moderate severity partially reversible perfusion defect of the lateral wall. These findings are suggestive of ischemia, but artifact cannot be excluded. Clinical correlation is recommended and additional testing may be necessary.  Echocardiogram 03/25/2018 Mild concentric LVH, EF normal, normal wall motion, grade 1 diastolic dysfunction, MAC, moderate MR, mild LAE, PASP 32   History of Present Illness: The patient was last seen in 10/2019 in clinic.  She is seen today with the help of the [facility] RN [name]).  Her son, [name], is also on the call today and helps with the history.  The patient was seen by neurology in January and placed on Aricept for memory loss.  The patient notes that her memory continues to worsen.  She continues to smoke cigarettes (1 pack/day).  She has chest discomfort every several days.  She takes nitroglycerin with relief.  Overall, her chest symptoms have remained stable without significant worsening.  She is  had some mild shortness of breath.  She has not had syncope, orthopnea, leg swelling.   Past Medical History:  Diagnosis Date  . At risk for medication noncompliance   . CAD  (coronary artery disease)    a. borderline disease by CT 08/2018.  . Diabetes mellitus, type 2 ([place])   . History of cervical cancer   . Hyperlipidemia   . Hypertension   . Moderate mitral regurgitation   . Noncompliance with medication treatment due to underuse of medication   . Raynaud's phenomenon   . TN (trigeminal neuralgia)    Past Surgical History:  Procedure Laterality Date  . BREAST BIOPSY    . BREAST ENHANCEMENT SURGERY    . CHOLECYSTECTOMY N/A 02/14/2014   Procedure: LAPAROSCOPIC CHOLECYSTECTOMY;  Surgeon: Ralene Ok, MD;  Location: [facility];  Service: General;  Laterality: N/A;  . ERCP N/A 03/08/2015   Procedure: ENDOSCOPIC RETROGRADE CHOLANGIOPANCREATOGRAPHY (ERCP);  Surgeon: Ladene Artist, MD;  Location: [facility];  Service: Endoscopy;  Laterality: N/A;  . MASS EXCISION Right 12/25/2016   Procedure: EXCISION RIGHT SCALP MASS SEBACEOUS CYST;  Surgeon: Coralie Keens, MD;  Location: [employer];  Service: General;  Laterality: Right;  . TOTAL ABDOMINAL HYSTERECTOMY  1963   for cervical cancer in situ     Current Meds  Medication Sig  . amLODipine (NORVASC) 5 MG tablet Take 1 tablet (5 mg total) by mouth daily.  [name] aspirin EC 81 MG tablet Take 1 tablet (81 mg total) by mouth at bedtime.  [name] atorvastatin (LIPITOR) 80 MG tablet Take 1 tablet (80 mg total) by mouth daily.  . Blood Glucose Monitoring Suppl (ONE TOUCH ULTRA MINI) w/Device KIT USE TO CHECK BLOOD SUGAR DAILY AND AS NEEDED  . carboxymethylcellulose (REFRESH PLUS) 0.5 % SOLN Place 1 drop into both eyes 2 (two) times daily.  [name] donepezil (ARICEPT) 10 MG tablet Take 10 mg by mouth at bedtime.  [name] glucose blood (ACCU-CHEK GUIDE) test strip Use as instructed  . Lancets (ONETOUCH ULTRASOFT) lancets Once daily or PRN, Dx E11.9  . lisinopril (ZESTRIL) 20 MG tablet Take 1 tablet (20 mg total) by mouth daily.  . metFORMIN (GLUCOPHAGE) 1000 MG tablet Take 1 tablet (1,000 mg total) by mouth 2 (two) times daily with a meal.  .  Multiple Vitamin (MULTIVITAMIN) capsule Take 1 capsule by mouth daily.  . Multiple Vitamins-Minerals (ONE DAILY PLUS MINERALS) TABS Take by mouth every morning.  . nitroGLYCERIN (NITROSTAT) 0.4 MG SL tablet PLACE 1 TABLET BY MOUTH UNDER THE TONGUE EVERY 5 MINUTES AS NEEDED FOR CHEST PAIN  . [DISCONTINUED] isosorbide mononitrate (IMDUR) 30 MG 24 hr tablet Take 30 mg by mouth daily.     Allergies:   Morphine and related   Social History   Tobacco Use  . Smoking status: Current Every Day Smoker    Packs/day: 1.50    Years: 60.00    Pack years: 90.00    Types: Cigarettes  . Smokeless tobacco: Never Used  Substance Use Topics  . Alcohol use: No  . Drug use: No     Family Hx: The patient's family history is negative for CAD and Diabetes Mellitus II.  ROS:   Please see the history of present illness.        Labs/Other Tests and Data Reviewed:    EKG:  No ECG reviewed.  Recent Labs: 07/19/2019: ALT 11 09/02/2019: BUN 19; Creatinine, Ser 1.10; Hemoglobin 16.1; Platelets 273; Potassium 4.6; Sodium 142 09/22/2019: TSH 0.78   Recent  Lipid Panel Lab Results  Component Value Date/Time   CHOL 128 07/19/2019 08:33 AM   TRIG 150 (H) 07/19/2019 08:33 AM   HDL 58 07/19/2019 08:33 AM   CHOLHDL 2.2 07/19/2019 08:33 AM   CHOLHDL 2.7 03/24/2018 07:30 PM   LDLCALC 40 07/19/2019 08:33 AM   LDLDIRECT 83.0 12/21/2014 10:40 AM    Wt Readings from Last 3 Encounters:  02/07/20 97 lb (44 kg)  11/30/19 97 lb (44 kg)  11/01/19 97 lb 6.4 oz (44.2 kg)     Objective:    Vital Signs:  BP (!) 150/72   Ht 5' (1.524 m)   Wt 97 lb (44 kg)   BMI 18.94 kg/m    VITAL SIGNS:  reviewed GEN:  no acute distress RESPIRATORY:  No labored breathing noted  ASSESSMENT & PLAN:    1. Coronary artery disease involving native coronary artery of native heart with angina pectoris (HCC) Moderate CAD by coronary CTA in October 2019.  There is borderline significant stenosis in the mid LCx.  Medical therapy  was recommended unless she has severe symptoms.  Overall, her anginal symptoms have remained fairly stable.  She has not noticed any significant worsening.  I have recommended that she continue on amlodipine 5 mg daily, aspirin 81 mg daily, atorvastatin 80 mg daily.  We thought she was taking Isosorbide 30 mg and I planned to increase it to 60 mg.  But, we found out she is actually taking Isosorbide 60 mg.  Therefore, I will increase it to 90 mg.  Her son does note that there was a problem with her pharmacy recently and her medications ran out.  He will contact the pharmacy to follow-up.    -Increase Isosorbide to 90 mg once daily   -FU with Dr. Radford Pax or me in person in 3 months.  2. Essential hypertension Blood pressure today per the nurse was 150/72.  Her pressures typically run 130s-150s.  Therefore, I recommended increasing her isosorbide.  We could increase amlodipine in the future if needed.  3. Hyperlipidemia, unspecified hyperlipidemia type Continue high intensity statin therapy with atorvastatin 80 mg daily.  LDL in 06/2019 was 40.  4. Tobacco abuse I have again recommended cessation.  We discussed the contribution of ongoing tobacco use to coronary artery disease and uncontrolled blood pressure.  The home health nurse did inquire about Wellbutrin.  As the patient is now on Aricept for memory loss, I explained that I would feel better if her neurologist or primary care provider made that decision.  5. Memory loss I have suggested that she contact her neurologist to discuss her response to Aricept.   Time:   Today, I have spent 20 minutes with the patient with telehealth technology discussing the above problems.     Medication Adjustments/Labs and Tests Ordered: Current medicines are reviewed at length with the patient today.  Concerns regarding medicines are outlined above.   Tests Ordered: No orders of the defined types were placed in this encounter.   Medication Changes: Meds  ordered this encounter  Medications  . DISCONTD: isosorbide mononitrate (IMDUR) 60 MG 24 hr tablet    Sig: Take 1 tablet (60 mg total) by mouth daily.    Dispense:  90 tablet    Refill:  3  . isosorbide mononitrate (IMDUR) 60 MG 24 hr tablet    Sig: Take 1.5 tablets (90 mg total) by mouth daily.    Dispense:  45 tablet    Refill:  6  Follow Up:  In Person in 3 month(s)  Signed, Richardson Dopp, PA-C  02/07/2020 12:30 PM    [facility]

## 2020-02-07 ENCOUNTER — Other Ambulatory Visit: Payer: Self-pay

## 2020-02-07 ENCOUNTER — Telehealth (INDEPENDENT_AMBULATORY_CARE_PROVIDER_SITE_OTHER): Payer: PPO | Admitting: Physician Assistant

## 2020-02-07 ENCOUNTER — Encounter: Payer: Self-pay | Admitting: Physician Assistant

## 2020-02-07 VITALS — BP 150/72 | Ht 60.0 in | Wt 97.0 lb

## 2020-02-07 DIAGNOSIS — Z720 Tobacco use: Secondary | ICD-10-CM | POA: Diagnosis not present

## 2020-02-07 DIAGNOSIS — I25119 Atherosclerotic heart disease of native coronary artery with unspecified angina pectoris: Secondary | ICD-10-CM

## 2020-02-07 DIAGNOSIS — R413 Other amnesia: Secondary | ICD-10-CM

## 2020-02-07 DIAGNOSIS — I10 Essential (primary) hypertension: Secondary | ICD-10-CM

## 2020-02-07 DIAGNOSIS — E785 Hyperlipidemia, unspecified: Secondary | ICD-10-CM

## 2020-02-07 MED ORDER — ISOSORBIDE MONONITRATE ER 60 MG PO TB24: 60.0000 mg | ORAL_TABLET | Freq: Every day | ORAL | 3 refills | Status: DC

## 2020-02-07 MED ORDER — ISOSORBIDE MONONITRATE ER 60 MG PO TB24: 90.0000 mg | ORAL_TABLET | Freq: Every day | ORAL | 6 refills | Status: DC

## 2020-02-07 NOTE — Patient Instructions (Addendum)
Medication Instructions:   Your physician has recommended you make the following change in your medication:   1) Increase Isosorbide to 90 mg, 1.5 tablets by mouth once a day  *If you need a refill on your cardiac medications before your next appointment, please call your pharmacy*  Lab Work:  None ordered today  If you have labs (blood work) drawn today and your tests are completely normal, you will receive your results only by: [name] MyChart Message (if you have MyChart) OR . A paper copy in the mail If you have any lab test that is abnormal or we need to change your treatment, we will call you to review the results.  Testing/Procedures:  None ordered today  Follow-Up: At [facility], you and your health needs are our priority.  As part of our continuing mission to provide you with exceptional heart care, we have created designated Provider Care Teams.  These Care Teams include your primary Cardiologist (physician) and Advanced Practice Providers (APPs -  Physician Assistants and Nurse Practitioners) who all work together to provide you with the care you need, when you need it.  We recommend signing up for the patient portal called "MyChart".  Sign up information is provided on this After Visit Summary.  MyChart is used to connect with patients for Virtual Visits (Telemedicine).  Patients are able to view lab/test results, encounter notes, upcoming appointments, etc.  Non-urgent messages can be sent to your provider as well.   To learn more about what you can do with MyChart, go to [email or web address].    Your next appointment:    On 05/11/20 at 1:00PM with [name], MD  Other Instructions  If your symptoms get worse call the office at [phone]

## 2020-02-11 NOTE — Progress Notes (Signed)
Please let patient and son know that I reviewed the rhythm strip sent by [facility]. This demonstrates normal sinus rhythm. Continue current medications. Richardson Dopp, PA-C    02/11/2020 1:41 PM

## 2020-03-28 ENCOUNTER — Telehealth (INDEPENDENT_AMBULATORY_CARE_PROVIDER_SITE_OTHER): Payer: PPO | Admitting: Internal Medicine

## 2020-03-28 ENCOUNTER — Other Ambulatory Visit: Payer: Self-pay

## 2020-03-28 DIAGNOSIS — E785 Hyperlipidemia, unspecified: Secondary | ICD-10-CM | POA: Diagnosis not present

## 2020-03-28 DIAGNOSIS — Z720 Tobacco use: Secondary | ICD-10-CM | POA: Diagnosis not present

## 2020-03-28 DIAGNOSIS — R413 Other amnesia: Secondary | ICD-10-CM

## 2020-03-28 DIAGNOSIS — I10 Essential (primary) hypertension: Secondary | ICD-10-CM

## 2020-03-28 DIAGNOSIS — E119 Type 2 diabetes mellitus without complications: Secondary | ICD-10-CM | POA: Diagnosis not present

## 2020-03-28 DIAGNOSIS — R0789 Other chest pain: Secondary | ICD-10-CM | POA: Diagnosis not present

## 2020-03-28 NOTE — Progress Notes (Signed)
Virtual Visit via Telephone Note  I connected with [name] on 03/28/20 at  1:00 PM EDT by telephone and verified that I am speaking with the correct person using two identifiers.   I discussed the limitations, risks, security and privacy concerns of performing an evaluation and management service by telephone and the availability of in person appointments. I also discussed with the patient that there may be a patient responsible charge related to this service. The patient expressed understanding and agreed to proceed.  Location patient: home Location provider: work office Participants present for the call: patient, provider, son [name] via three-way calling Patient did not have a visit in the prior 7 days to address this/these issue(s).   History of Present Illness:  This is a scheduled follow-up of chronic medical conditions.  1.  She has diabetes, she has not had an A1c since last October at which time it was 8.5.  She tells me that her CBGs are between 120 and 125 at home.  2.  Hypertension, she has not checked her blood pressure since her last in office visit.  3.  Memory loss.  She was diagnosed with mild dementia and started on Aricept by neurology.  She is tolerating medications well.  She is still able to drive, lives independently.  We have been pill packing her medication as her biggest issue is medication noncompliance.  4.  Coronary artery disease and chest pain.  She still continues to intermittently have chest pain for which she uses nitroglycerin.  Episodes have decreased to about once to twice a week.  She follows closely with cardiology for this.  5.  Tobacco abuse, she is still smoking about 15 cigarettes a day.   Observations/Objective: Patient sounds cheerful and well on the phone. I do not appreciate any increased work of breathing. Speech and thought processing are grossly intact. Patient reported vitals: None reported   Current Outpatient  Medications:  .  amLODipine (NORVASC) 5 MG tablet, Take 1 tablet (5 mg total) by mouth daily., Disp: 90 tablet, Rfl: 3 .  aspirin EC 81 MG tablet, Take 1 tablet (81 mg total) by mouth at bedtime., Disp: 90 tablet, Rfl: 3 .  atorvastatin (LIPITOR) 80 MG tablet, Take 1 tablet (80 mg total) by mouth daily., Disp: 90 tablet, Rfl: 3 .  Blood Glucose Monitoring Suppl (ONE TOUCH ULTRA MINI) w/Device KIT, USE TO CHECK BLOOD SUGAR DAILY AND AS NEEDED, Disp: 1 each, Rfl: 0 .  carboxymethylcellulose (REFRESH PLUS) 0.5 % SOLN, Place 1 drop into both eyes 2 (two) times daily., Disp: , Rfl:  .  donepezil (ARICEPT) 10 MG tablet, Take 10 mg by mouth at bedtime., Disp: , Rfl:  .  glucose blood (ACCU-CHEK GUIDE) test strip, Use as instructed, Disp: 100 each, Rfl: 12 .  isosorbide mononitrate (IMDUR) 60 MG 24 hr tablet, Take 1.5 tablets (90 mg total) by mouth daily., Disp: 45 tablet, Rfl: 6 .  Lancets (ONETOUCH ULTRASOFT) lancets, Once daily or PRN, Dx E11.9, Disp: 100 each, Rfl: 12 .  lisinopril (ZESTRIL) 20 MG tablet, Take 1 tablet (20 mg total) by mouth daily., Disp: 90 tablet, Rfl: 3 .  metFORMIN (GLUCOPHAGE) 1000 MG tablet, Take 1 tablet (1,000 mg total) by mouth 2 (two) times daily with a meal., Disp: 60 tablet, Rfl: 6 .  Multiple Vitamin (MULTIVITAMIN) capsule, Take 1 capsule by mouth daily., Disp: 90 capsule, Rfl: 3 .  Multiple Vitamins-Minerals (ONE DAILY PLUS MINERALS) TABS, Take by mouth every morning.,  Disp: , Rfl:  .  nitroGLYCERIN (NITROSTAT) 0.4 MG SL tablet, PLACE 1 TABLET BY MOUTH UNDER THE TONGUE EVERY 5 MINUTES AS NEEDED FOR CHEST PAIN, Disp: 50 tablet, Rfl: 3  Review of Systems:  Constitutional: Denies fever, chills, diaphoresis, appetite change and fatigue.  HEENT: Denies photophobia, eye pain, redness, hearing loss, ear pain, congestion, sore throat, rhinorrhea, sneezing, mouth sores, trouble swallowing, neck pain, neck stiffness and tinnitus.   Respiratory: Denies SOB, DOE, cough, chest  tightness,  and wheezing.   Cardiovascular: Denies chest pain, palpitations and leg swelling.  Gastrointestinal: Denies nausea, vomiting, abdominal pain, diarrhea, constipation, blood in stool and abdominal distention.  Genitourinary: Denies dysuria, urgency, frequency, hematuria, flank pain and difficulty urinating.  Endocrine: Denies: hot or cold intolerance, sweats, changes in hair or nails, polyuria, polydipsia. Musculoskeletal: Denies myalgias, back pain, joint swelling, arthralgias and gait problem.  Skin: Denies pallor, rash and wound.  Neurological: Denies dizziness, seizures, syncope, weakness, light-headedness, numbness and headaches.  Hematological: Denies adenopathy. Easy bruising, personal or family bleeding history  Psychiatric/Behavioral: Denies suicidal ideation, mood changes, confusion, nervousness, sleep disturbance and agitation   Assessment and Plan:  Diabetes mellitus type 2 in nonobese [facility]) -She will come in office next visit to check her A1c. -If her CBGs actually between 120 and 125, A1c will be improved.  Essential hypertension -Check when she comes in office next visit, no medication changes today.  Dyslipidemia -Last LDL was 40 in August 2020, she is on atorvastatin 80 mg.  Tobacco abuse -She will try and decrease her smoking, she states has been difficult as she has been smoking for over 60 years.  Atypical chest pain -Follows with cardiology.  Memory loss -On Aricept, encouraged her to make appointment for follow-up with neurology.    I discussed the assessment and treatment plan with the patient. The patient was provided an opportunity to ask questions and all were answered. The patient agreed with the plan and demonstrated an understanding of the instructions.   The patient was advised to call back or seek an in-person evaluation if the symptoms worsen or if the condition fails to improve as anticipated.  I provided 24 minutes of non-face-to-face  time during this encounter.   Lelon Frohlich, MD [facility]

## 2020-04-11 DIAGNOSIS — E119 Type 2 diabetes mellitus without complications: Secondary | ICD-10-CM | POA: Diagnosis not present

## 2020-04-11 DIAGNOSIS — H40033 Anatomical narrow angle, bilateral: Secondary | ICD-10-CM | POA: Diagnosis not present

## 2020-04-25 ENCOUNTER — Encounter: Payer: Self-pay | Admitting: Internal Medicine

## 2020-04-25 ENCOUNTER — Telehealth: Payer: Self-pay | Admitting: Internal Medicine

## 2020-04-25 ENCOUNTER — Ambulatory Visit (INDEPENDENT_AMBULATORY_CARE_PROVIDER_SITE_OTHER): Payer: PPO | Admitting: Internal Medicine

## 2020-04-25 ENCOUNTER — Other Ambulatory Visit: Payer: Self-pay

## 2020-04-25 VITALS — BP 110/76 | Temp 97.3°F | Wt 91.3 lb

## 2020-04-25 DIAGNOSIS — I10 Essential (primary) hypertension: Secondary | ICD-10-CM

## 2020-04-25 DIAGNOSIS — R0789 Other chest pain: Secondary | ICD-10-CM

## 2020-04-25 DIAGNOSIS — R634 Abnormal weight loss: Secondary | ICD-10-CM | POA: Diagnosis not present

## 2020-04-25 DIAGNOSIS — E119 Type 2 diabetes mellitus without complications: Secondary | ICD-10-CM | POA: Diagnosis not present

## 2020-04-25 DIAGNOSIS — Z720 Tobacco use: Secondary | ICD-10-CM

## 2020-04-25 DIAGNOSIS — E785 Hyperlipidemia, unspecified: Secondary | ICD-10-CM

## 2020-04-25 LAB — LIPID PANEL
Cholesterol: 158 mg/dL (ref 0–200)
HDL: 57.6 mg/dL (ref >39.00)
LDL Cholesterol: 73 mg/dL (ref 0–99)
NonHDL: 100.75
Total CHOL/HDL Ratio: 3
Triglycerides: 139 mg/dL (ref 0.0–149.0)
VLDL: 27.8 mg/dL (ref 0.0–40.0)

## 2020-04-25 LAB — CBC WITH DIFFERENTIAL/PLATELET
Basophils Absolute: 0.1 10*3/uL (ref 0.0–0.1)
Basophils Relative: 1.1 % (ref 0.0–3.0)
Eosinophils Absolute: 0.1 10*3/uL (ref 0.0–0.7)
Eosinophils Relative: 1.9 % (ref 0.0–5.0)
HCT: 45.8 % (ref 36.0–46.0)
Hemoglobin: 15.4 g/dL — ABNORMAL HIGH (ref 12.0–15.0)
Lymphocytes Relative: 24.1 % (ref 12.0–46.0)
Lymphs Abs: 1.9 10*3/uL (ref 0.7–4.0)
MCHC: 33.5 g/dL (ref 30.0–36.0)
MCV: 97.6 fl (ref 78.0–100.0)
Monocytes Absolute: 0.6 10*3/uL (ref 0.1–1.0)
Monocytes Relative: 7.5 % (ref 3.0–12.0)
Neutro Abs: 5.2 10*3/uL (ref 1.4–7.7)
Neutrophils Relative %: 65.4 % (ref 43.0–77.0)
Platelets: 241 10*3/uL (ref 150.0–400.0)
RBC: 4.7 Mil/uL (ref 3.87–5.11)
RDW: 13.7 % (ref 11.5–15.5)
WBC: 7.9 10*3/uL (ref 4.0–10.5)

## 2020-04-25 LAB — POCT GLYCOSYLATED HEMOGLOBIN (HGB A1C): Hemoglobin A1C: 6.2 % — AB (ref 4.0–5.6)

## 2020-04-25 LAB — COMPREHENSIVE METABOLIC PANEL
ALT: 14 U/L (ref 0–35)
AST: 21 U/L (ref 0–37)
Albumin: 4.3 g/dL (ref 3.5–5.2)
Alkaline Phosphatase: 74 U/L (ref 39–117)
BUN: 16 mg/dL (ref 6–23)
CO2: 26 mEq/L (ref 19–32)
Calcium: 9.9 mg/dL (ref 8.4–10.5)
Chloride: 106 mEq/L (ref 96–112)
Creatinine, Ser: 0.85 mg/dL (ref 0.40–1.20)
GFR: 63.68 mL/min (ref >60.00)
Glucose, Bld: 91 mg/dL (ref 70–99)
Potassium: 4 mEq/L (ref 3.5–5.1)
Sodium: 142 mEq/L (ref 135–145)
Total Bilirubin: 0.6 mg/dL (ref 0.2–1.2)
Total Protein: 6.6 g/dL (ref 6.0–8.3)

## 2020-04-25 LAB — VITAMIN D 25 HYDROXY (VIT D DEFICIENCY, FRACTURES): VITD: 45.03 ng/mL (ref 30.00–100.00)

## 2020-04-25 LAB — VITAMIN B12: Vitamin B-12: 721 pg/mL (ref 211–911)

## 2020-04-25 LAB — TSH: TSH: 1.94 u[IU]/mL (ref 0.35–4.50)

## 2020-04-25 MED ORDER — MIRTAZAPINE 15 MG PO TABS: 15.0000 mg | ORAL_TABLET | Freq: Every day | ORAL | 1 refills | Status: DC

## 2020-04-25 NOTE — Telephone Encounter (Signed)
Pt's son, Cordelia Pen (ok per DPR), stated that his mothers recent appt was suppose to be regarding her swollen feet/ankles and he did not see anything in her after summary about what needs to be done in regards to that, he would also like to speak to PCP/CMA about her medication mirtazapine and side effects of her medication. Also, he stated that the pt over the weekend before her last appt with PCP, she was throwing up-concerning enough to where one of her friends stated she may needs to go to the ED-she did start feeling better so decided not to go and he is unsure what was behind her throwing up and that she mentioned it to Dr. Jerilee Hoh. He would like more information about that as well.    Majel Homer can be reached at [phone]

## 2020-04-25 NOTE — Patient Instructions (Signed)
-  Nice seeing you today!!  -Lab work today; will notify you once results are available.  -Start Remeron 15 mg at bedtime. It might help with your appetite.   -Schedule follow up in 3 months.

## 2020-04-25 NOTE — Progress Notes (Signed)
Established Patient Office Visit     This visit occurred during the SARS-CoV-2 public health emergency.  Safety protocols were in place, including screening questions prior to the visit, additional usage of staff PPE, and extensive cleaning of exam room while observing appropriate contact time as indicated for disinfecting solutions.    CC/Reason for Visit: Routine follow-up and discuss weight loss  HPI: [name] is a 86 y.o. female who is coming in today for the above mentioned reasons. Past Medical History is significant for: Well-controlled hypertension and type 2 diabetes.  Mild dementia followed by neurology.  On Aricept.  She also has a history of coronary artery disease with intermittent chest pain for which she uses nitroglycerin.  She follows closely with cardiology for this.  She has been a heavy smoker her whole life and is still smoking about 15 cigarettes a day.  She comes in today accompanied by her brother.  She has lost her appetite, has not been eating well at all.  She has in fact lost 6 pounds since her last documented weight in March and is now down to 91 pounds.  She has no difficulty swallowing or pain when she eats, she states she simply does not have an appetite.  She is due for her A1c check today.   Past Medical/Surgical History: Past Medical History:  Diagnosis Date   At risk for medication noncompliance    CAD (coronary artery disease)    a. borderline disease by CT 08/2018.   Diabetes mellitus, type 2 ([place])    History of cervical cancer    Hyperlipidemia    Hypertension    Moderate mitral regurgitation    Noncompliance with medication treatment due to underuse of medication    Raynaud's phenomenon    TN (trigeminal neuralgia)     Past Surgical History:  Procedure Laterality Date   BREAST BIOPSY     BREAST ENHANCEMENT SURGERY     CHOLECYSTECTOMY N/A 02/14/2014   Procedure: LAPAROSCOPIC CHOLECYSTECTOMY;  Surgeon:  Ralene Ok, MD;  Location: [facility];  Service: General;  Laterality: N/A;   ERCP N/A 03/08/2015   Procedure: ENDOSCOPIC RETROGRADE CHOLANGIOPANCREATOGRAPHY (ERCP);  Surgeon: Ladene Artist, MD;  Location: [facility];  Service: Endoscopy;  Laterality: N/A;   MASS EXCISION Right 12/25/2016   Procedure: EXCISION RIGHT SCALP MASS SEBACEOUS CYST;  Surgeon: Coralie Keens, MD;  Location: [facility];  Service: General;  Laterality: Right;   [place]   for cervical cancer in situ    Social History:  reports that she has been smoking cigarettes. She has a 90.00 pack-year smoking history. She has never used smokeless tobacco. She reports that she does not drink alcohol or use drugs.  Allergies: Allergies  Allergen Reactions   Morphine And Related Itching    Short lived, mild itching.    Family History:  Family History  Problem Relation Age of Onset   CAD Neg Hx    Diabetes Mellitus II Neg Hx      Current Outpatient Medications:    amLODipine (NORVASC) 5 MG tablet, Take 1 tablet (5 mg total) by mouth daily., Disp: 90 tablet, Rfl: 3   aspirin EC 81 MG tablet, Take 1 tablet (81 mg total) by mouth at bedtime., Disp: 90 tablet, Rfl: 3   atorvastatin (LIPITOR) 80 MG tablet, Take 1 tablet (80 mg total) by mouth daily., Disp: 90 tablet, Rfl: 3   Blood Glucose Monitoring Suppl (ONE TOUCH ULTRA MINI) w/Device  KIT, USE TO CHECK BLOOD SUGAR DAILY AND AS NEEDED, Disp: 1 each, Rfl: 0   carboxymethylcellulose (REFRESH PLUS) 0.5 % SOLN, Place 1 drop into both eyes 2 (two) times daily., Disp: , Rfl:    donepezil (ARICEPT) 10 MG tablet, Take 10 mg by mouth at bedtime., Disp: , Rfl:    glucose blood (ACCU-CHEK GUIDE) test strip, Use as instructed, Disp: 100 each, Rfl: 12   isosorbide mononitrate (IMDUR) 60 MG 24 hr tablet, Take 1.5 tablets (90 mg total) by mouth daily., Disp: 45 tablet, Rfl: 6   Lancets (ONETOUCH ULTRASOFT) lancets, Once daily or PRN, Dx E11.9, Disp:  100 each, Rfl: 12   lisinopril (ZESTRIL) 20 MG tablet, Take 1 tablet (20 mg total) by mouth daily., Disp: 90 tablet, Rfl: 3   metFORMIN (GLUCOPHAGE) 1000 MG tablet, Take 1 tablet (1,000 mg total) by mouth 2 (two) times daily with a meal., Disp: 60 tablet, Rfl: 6   Multiple Vitamin (MULTIVITAMIN) capsule, Take 1 capsule by mouth daily., Disp: 90 capsule, Rfl: 3   Multiple Vitamins-Minerals (ONE DAILY PLUS MINERALS) TABS, Take by mouth every morning., Disp: , Rfl:    nitroGLYCERIN (NITROSTAT) 0.4 MG SL tablet, PLACE 1 TABLET BY MOUTH UNDER THE TONGUE EVERY 5 MINUTES AS NEEDED FOR CHEST PAIN, Disp: 50 tablet, Rfl: 3   mirtazapine (REMERON) 15 MG tablet, Take 1 tablet (15 mg total) by mouth at bedtime., Disp: 90 tablet, Rfl: 1  Review of Systems:  Constitutional: Denies fever, chills, diaphoresis and fatigue.  HEENT: Denies photophobia, eye pain, redness, hearing loss, ear pain, congestion, sore throat, rhinorrhea, sneezing, mouth sores, trouble swallowing, neck pain, neck stiffness and tinnitus.   Respiratory: Denies SOB, DOE, cough, chest tightness,  and wheezing.   Cardiovascular: Denies palpitations and leg swelling.  Gastrointestinal: Denies nausea, vomiting, abdominal pain, diarrhea, constipation, blood in stool and abdominal distention.  Genitourinary: Denies dysuria, urgency, frequency, hematuria, flank pain and difficulty urinating.  Endocrine: Denies: hot or cold intolerance, sweats, changes in hair or nails, polyuria, polydipsia. Musculoskeletal: Denies myalgias, back pain, joint swelling, arthralgias and gait problem.  Skin: Denies pallor, rash and wound.  Neurological: Denies dizziness, seizures, syncope, weakness, light-headedness, numbness and headaches.  Hematological: Denies adenopathy. Easy bruising, personal or family bleeding history  Psychiatric/Behavioral: Denies suicidal ideation, mood changes, confusion, nervousness, sleep disturbance and agitation    Physical  Exam: Vitals:   04/25/20 0759  BP: 110/76  Temp: (!) 97.3 F (36.3 C)  TempSrc: Temporal  Weight: 91 lb 4.8 oz (41.4 kg)    Body mass index is 17.83 kg/m.   Constitutional: NAD, calm, comfortable, thin Eyes: PERRL, lids and conjunctivae normal ENMT: Mucous membranes are moist.  Neck: normal, supple, no masses, no thyromegaly Respiratory: clear to auscultation bilaterally, no wheezing, no crackles. Normal respiratory effort. No accessory muscle use.  Cardiovascular: Regular rate and rhythm, no murmurs / rubs / gallops. No extremity edema. 2+ pedal pulses. Abdomen: no tenderness, no masses palpated. No hepatosplenomegaly. Bowel sounds positive.  Musculoskeletal: no clubbing / cyanosis. No joint deformity upper and lower extremities. Good ROM, no contractures. Normal muscle tone.  Neurologic: Grossly intact and nonfocal Psychiatric: Normal judgment and insight. Alert and oriented x 3. Normal mood.    Impression and Plan:  Diabetes mellitus type 2 in nonobese (HCC) -A1c in office today demonstrates good control at 6.2.  Weight loss -Full blood panel today for work-up. -Start Remeron 15 mg at bedtime to see if this helps stimulate her appetite. -Of course there is  concern for malignancy, especially given her long-term smoking history.  Consider further work-up pending clinical course and results of lab work.  Essential hypertension -Well-controlled on current regimen  Tobacco abuse -She is not interested in smoking cessation at this time.  Atypical chest pain -Still has 1-2 episodes a week, takes nitroglycerin, followed by cardiology  Dyslipidemia -Last LDL was 40 in 2020. -She is on atorvastatin 80 mg.   Patient Instructions  -Nice seeing you today!!  -Lab work today; will notify you once results are available.  -Start Remeron 15 mg at bedtime. It might help with your appetite.   -Schedule follow up in 3 months.     Lelon Frohlich, MD [place]  [facility]

## 2020-04-28 NOTE — Telephone Encounter (Signed)
Pts son [name]) would like to have a call back to discuss his mothers visit on 04/25/2020 today.  [name] [phone]

## 2020-05-10 ENCOUNTER — Telehealth: Payer: Self-pay | Admitting: Cardiology

## 2020-05-10 NOTE — Telephone Encounter (Signed)
New Message:    Pt's son is unable to come with pt for her appt tomorrow with Dr Radford Pax. He would like to be called during this appt please, so he can understand everything that is going on.It is hard for the pt to understand.

## 2020-05-11 ENCOUNTER — Telehealth: Payer: Self-pay | Admitting: Cardiology

## 2020-05-11 ENCOUNTER — Other Ambulatory Visit: Payer: Self-pay

## 2020-05-11 ENCOUNTER — Ambulatory Visit: Payer: PPO | Admitting: Cardiology

## 2020-05-11 ENCOUNTER — Encounter: Payer: Self-pay | Admitting: Cardiology

## 2020-05-11 VITALS — BP 122/50 | HR 91 | Ht 60.0 in | Wt 92.4 lb

## 2020-05-11 DIAGNOSIS — I25118 Atherosclerotic heart disease of native coronary artery with other forms of angina pectoris: Secondary | ICD-10-CM

## 2020-05-11 DIAGNOSIS — E785 Hyperlipidemia, unspecified: Secondary | ICD-10-CM | POA: Diagnosis not present

## 2020-05-11 DIAGNOSIS — I10 Essential (primary) hypertension: Secondary | ICD-10-CM

## 2020-05-11 DIAGNOSIS — R413 Other amnesia: Secondary | ICD-10-CM

## 2020-05-11 DIAGNOSIS — Z720 Tobacco use: Secondary | ICD-10-CM | POA: Diagnosis not present

## 2020-05-11 MED ORDER — ISOSORBIDE MONONITRATE ER 120 MG PO TB24: 120.0000 mg | ORAL_TABLET | Freq: Every day | ORAL | 3 refills | Status: DC

## 2020-05-11 NOTE — Telephone Encounter (Signed)
    Pt's son [name] called, he said no one called him during pt's appt today.. he would like to know what is pt's plan of care and what happen to pr's appt.

## 2020-05-11 NOTE — Telephone Encounter (Signed)
Spoke with the patient's son and made him aware of changes made at patient's office visit today. He is aware that she will increase Imdur to 120 mg daily and follow up with Richardson Dopp. Patient's son verbalized understanding and thanked me for the call. He stated that he would like to be called during any visit that the patient has if he is unable to attend.

## 2020-05-11 NOTE — Patient Instructions (Signed)
Medication Instructions:  Your physician has recommended you make the following change in your medication:  1) INCREASE Imdur (isosorbide mononitrate) to 120 mg daily   *If you need a refill on your cardiac medications before your next appointment, please call your pharmacy*  Follow-Up: At [facility], you and your health needs are our priority.  As part of our continuing mission to provide you with exceptional heart care, we have created designated Provider Care Teams.  These Care Teams include your primary Cardiologist (physician) and Advanced Practice Providers (APPs -  Physician Assistants and Nurse Practitioners) who all work together to provide you with the care you need, when you need it.  Your next appointment:   3-4 week(s)  The format for your next appointment:   In Person  Provider:   Richardson Dopp, PA-C   Follow up with Dr. Radford Pax in 6 months

## 2020-05-11 NOTE — Progress Notes (Signed)
Cardiology Office Note:    Date:  05/11/2020   ID:  [name], DOB [birth year], MRN [id number]  PCP:  Isaac Bliss, Rayford Halsted, MD  Cardiologist:  [name], MD    Referring MD: Isaac Bliss, [place]*   Chief Complaint  Patient presents with  . Chest Pain  . Coronary Artery Disease  . Hyperlipidemia    History of Present Illness:    Lory Nowaczyk is a 86 y.o. female with    Coronary artery disease ? Myoview 5/19 with anteroseptal and lateral ischemia ? CTA 10/19: Coronary calcium 662, moderate CAD in the LAD and LCx with borderline significance in the LCx by FFR>>medical therapy unless significant symptoms  Mitral regurgitation ? Moderate by echo 5/19  Carotid artery disease  Diabetes mellitus  Hypertension  Hyperlipidemia  Raynaud's disease  History of elevated LFTs with dilated common bile duct  Tobacco abuse  Trigeminal neuralgia  Medicationnon adherence  Dementia  Prior Cardiac Studies Coronary CTA 09/21/2018 IMPRESSION: 1. Coronary calcium score of 662. This was 70 percentile for age and sex matched control.  2. Normal coronary origin with right dominance.  3. Moderate CAD in the proximal and mid LAD and proximal and mid LCX arteries. Additional analysis with CT FFR will be submitted.  4. Severe mitral annular calcifications. FFR 1. Left Main: No significant stenosis.  2. LAD: Proximal: 0.92, distal: 0.80. 3. D1: 0.81. 4. LCX: Proximal: 0.98, distal: 0.78. 5. RCA: Proximal: 0.98, distal: 0.85. 6. PDA: 0.81.  IMPRESSION: 1. CT FFR analysis showed stenosis in the mid portion of a non-dominant LCX artery. A medical management would be recommended unless there are significant symptoms.  Carotid US 07/31/2018 Bilateral ICA 1-39  Myoview 04/15/2018 Large size, moderate severity partially reversible anteroseptal and septal perfusion defect and a medium size, moderate severity partially reversible  perfusion defect of the lateral wall. These findings are suggestive of ischemia, but artifact cannot be excluded. Clinical correlation is recommended and additional testing may be necessary.  Echocardiogram 03/25/2018 Mild concentric LVH, EF normal, normal wall motion, grade 1 diastolic dysfunction, MAC, moderate MR, mild LAE, PASP 32  History of Present Illness: The patient was last seen in clinic by Richardson Dopp, [place] in March 2021. The patient was seen by neurology in January and placed on Aricept for memory loss.  The patient notes that her memory continues to worsen.  She continues to smoke cigarettes (1 pack/day).  When seen by Nicki Reaper, she was still complaining of chest discomfort every several days that is relieved with SL NTG. Her Imdur was increased to 22m daily and is now back to see me.    She is here today for followup and is doing well.  She says that her chest pain has improved with increased does of Imdur. She still has some CP during the week but only lasts about 10 minutes and resolves after a SL NTG.  She denies any SOB, DOE, PND, orthopnea, LE edema, dizziness, palpitations or syncope. She is compliant with her meds and is tolerating meds with no SE.    Past Medical History:  Diagnosis Date  . At risk for medication noncompliance   . CAD (coronary artery disease)    a. borderline disease by CT 08/2018.  . Diabetes mellitus, type 2 ([place]   . History of cervical cancer   . Hyperlipidemia   . Hypertension   . Moderate mitral regurgitation   . Noncompliance with medication treatment due to underuse of medication   . Raynaud's phenomenon   .  TN (trigeminal neuralgia)     Past Surgical History:  Procedure Laterality Date  . BREAST BIOPSY    . BREAST ENHANCEMENT SURGERY    . CHOLECYSTECTOMY N/A 02/14/2014   Procedure: LAPAROSCOPIC CHOLECYSTECTOMY;  Surgeon: Ralene Ok, MD;  Location: [facility];  Service: General;  Laterality: N/A;  . ERCP N/A 03/08/2015   Procedure:  ENDOSCOPIC RETROGRADE CHOLANGIOPANCREATOGRAPHY (ERCP);  Surgeon: Ladene Artist, MD;  Location: [facility];  Service: Endoscopy;  Laterality: N/A;  . MASS EXCISION Right 12/25/2016   Procedure: EXCISION RIGHT SCALP MASS SEBACEOUS CYST;  Surgeon: Coralie Keens, MD;  Location: [facility];  Service: General;  Laterality: Right;  . TOTAL ABDOMINAL HYSTERECTOMY  1963   for cervical cancer in situ    Current Medications: Current Meds  Medication Sig  . amLODipine (NORVASC) 5 MG tablet Take 1 tablet (5 mg total) by mouth daily.  [name] aspirin EC 81 MG tablet Take 1 tablet (81 mg total) by mouth at bedtime.  [name] atorvastatin (LIPITOR) 80 MG tablet Take 1 tablet (80 mg total) by mouth daily.  . Blood Glucose Monitoring Suppl (ONE TOUCH ULTRA MINI) w/Device KIT USE TO CHECK BLOOD SUGAR DAILY AND AS NEEDED  . carboxymethylcellulose (REFRESH PLUS) 0.5 % SOLN Place 1 drop into both eyes 2 (two) times daily.  [name] donepezil (ARICEPT) 10 MG tablet Take 10 mg by mouth at bedtime.  [name] glucose blood (ACCU-CHEK GUIDE) test strip Use as instructed  . Lancets (ONETOUCH ULTRASOFT) lancets Once daily or PRN, Dx E11.9  . lisinopril (ZESTRIL) 20 MG tablet Take 1 tablet (20 mg total) by mouth daily.  . metFORMIN (GLUCOPHAGE) 1000 MG tablet Take 1 tablet (1,000 mg total) by mouth 2 (two) times daily with a meal.  . mirtazapine (REMERON) 15 MG tablet Take 1 tablet (15 mg total) by mouth at bedtime.  . Multiple Vitamin (MULTIVITAMIN) capsule Take 1 capsule by mouth daily.  . Multiple Vitamins-Minerals (ONE DAILY PLUS MINERALS) TABS Take by mouth every morning.  . nitroGLYCERIN (NITROSTAT) 0.4 MG SL tablet PLACE 1 TABLET BY MOUTH UNDER THE TONGUE EVERY 5 MINUTES AS NEEDED FOR CHEST PAIN  . [DISCONTINUED] isosorbide mononitrate (IMDUR) 60 MG 24 hr tablet Take 1.5 tablets (90 mg total) by mouth daily.     Allergies:   Morphine and related   Social History   Socioeconomic History  . Marital status: Widowed    Spouse name: [name]   . Number of children: 1  . Years of education: Not on file  . Highest education level: Not on file  Occupational History  . 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 Smoking status: Current Every Day Smoker    Packs/day: 1.50    Years: 60.00    Pack years: 90.00    Types: Cigarettes  . Smokeless tobacco: Never Used  Vaping Use  . Vaping Use: Never used  Substance and Sexual Activity  . Alcohol use: No  . Drug use: No  . Sexual activity: Not on file  Other Topics Concern  . Not on file  Social History Narrative   Married.  Lives in [place] with her husband.  No FH of heart disease.   1 adult son   + smoker   No EtOH   Social Determinants of Health   Financial Resource Strain:   . Difficulty of Paying Living Expenses:   Food Insecurity:   . Worried About Charity fundraiser in the Last Year:   .  Ran Out of Food in the Last Year:   Transportation Needs:   . Film/video editor (Medical):   [name] Lack of Transportation (Non-Medical):   Physical Activity:   . Days of Exercise per Week:   . Minutes of Exercise per Session:   Stress:   . Feeling of Stress :   Social Connections:   . Frequency of Communication with Friends and Family:   . Frequency of Social Gatherings with Friends and Family:   . Attends Religious Services:   . Active Member of Clubs or Organizations:   . Attends Archivist Meetings:   [name] Marital Status:      Family History: The patient's family history is negative for CAD and Diabetes Mellitus II.  ROS:   Please see the history of present illness.    ROS  All other systems reviewed and negative.   EKGs/Labs/Other Studies Reviewed:    The following studies were reviewed today:    EKG:  EKG is not ordered today.    Recent Labs: 04/25/2020: ALT 14; BUN 16; Creatinine, Ser 0.85; Hemoglobin 15.4; Platelets 241.0; Potassium 4.0; Sodium 142; TSH 1.94   Recent Lipid Panel    Component Value Date/Time   CHOL  158 04/25/2020 0841   CHOL 128 07/19/2019 0833   TRIG 139.0 04/25/2020 0841   HDL 57.60 04/25/2020 0841   HDL 58 07/19/2019 0833   CHOLHDL 3 04/25/2020 0841   VLDL 27.8 04/25/2020 0841   LDLCALC 73 04/25/2020 0841   LDLCALC 40 07/19/2019 0833   LDLDIRECT 83.0 12/21/2014 1040    Physical Exam:    VS:  BP (!) 122/50   Pulse 91   Ht 5' (1.524 m)   Wt 92 lb 6.4 oz (41.9 kg)   SpO2 96%   BMI 18.05 kg/m     Wt Readings from Last 3 Encounters:  05/11/20 92 lb 6.4 oz (41.9 kg)  04/25/20 91 lb 4.8 oz (41.4 kg)  02/07/20 97 lb (44 kg)     GEN:  Well nourished, well developed in no acute distress HEENT: Normal NECK: No JVD; No carotid bruits LYMPHATICS: No lymphadenopathy CARDIAC: RRR, no murmurs, rubs, gallops RESPIRATORY:  Clear to auscultation without rales, wheezing or rhonchi  ABDOMEN: Soft, non-tender, non-distended MUSCULOSKELETAL:  No edema; No deformity  SKIN: Warm and dry NEUROLOGIC:  Alert and oriented x 3 PSYCHIATRIC:  Normal affect   ASSESSMENT:    1. Atherosclerosis of coronary artery of native heart with stable angina pectoris, unspecified vessel or lesion type ([place])   2. Essential hypertension   3. Dyslipidemia   4. Tobacco abuse   5. Memory loss    PLAN:    In order of problems listed above:  1. Coronary artery disease involving native coronary artery of native heart with angina pectoris ([place]) -Moderate CAD by coronary CTA in October 2019 with borderline significant stenosis in the mid LCx.   -Medical therapy was recommended unless she has severe symptoms due to her significant dementia.   -she has chronic stable angina which has improved on higher doses of long acting nitrates -still having some CP through the week -I will increase Imdur to 1376m daily and followup with SRichardson DoppPA in 3-4 weeks -continue amlodipine 51mdaily, ASA and statin   2. Essential hypertension -BP controlled on exam -continue amlodipine 76m47maily and Imdur  3.  Hyperlipidemia, unspecified hyperlipidemia type -LDL goal < 70 -LDL was 73 in June -continue atorvastatin 37m72mily  4. Tobacco abuse -  again recommended tobacco cessation  5. Memory loss Per Neuro -on Aricept    Medication Adjustments/Labs and Tests Ordered: Current medicines are reviewed at length with the patient today.  Concerns regarding medicines are outlined above.  No orders of the defined types were placed in this encounter.  Meds ordered this encounter  Medications  . isosorbide mononitrate (IMDUR) 120 MG 24 hr tablet    Sig: Take 1 tablet (120 mg total) by mouth daily.    Dispense:  90 tablet    Refill:  3    Dose increased.    Signed, [name], MD  05/11/2020 1:31 PM    [place]

## 2020-05-30 ENCOUNTER — Encounter: Payer: Self-pay | Admitting: Neurology

## 2020-05-30 ENCOUNTER — Ambulatory Visit (INDEPENDENT_AMBULATORY_CARE_PROVIDER_SITE_OTHER): Payer: PPO | Admitting: Neurology

## 2020-05-30 ENCOUNTER — Other Ambulatory Visit: Payer: Self-pay

## 2020-05-30 ENCOUNTER — Telehealth: Payer: Self-pay | Admitting: Physician Assistant

## 2020-05-30 VITALS — BP 164/76 | HR 69 | Ht 60.0 in | Wt 90.2 lb

## 2020-05-30 DIAGNOSIS — E119 Type 2 diabetes mellitus without complications: Secondary | ICD-10-CM | POA: Diagnosis not present

## 2020-05-30 DIAGNOSIS — F03A0 Unspecified dementia, mild, without behavioral disturbance, psychotic disturbance, mood disturbance, and anxiety: Secondary | ICD-10-CM

## 2020-05-30 DIAGNOSIS — I7300 Raynaud's syndrome without gangrene: Secondary | ICD-10-CM | POA: Diagnosis not present

## 2020-05-30 DIAGNOSIS — F0390 Unspecified dementia without behavioral disturbance: Secondary | ICD-10-CM | POA: Diagnosis not present

## 2020-05-30 DIAGNOSIS — I10 Essential (primary) hypertension: Secondary | ICD-10-CM | POA: Diagnosis not present

## 2020-05-30 DIAGNOSIS — R079 Chest pain, unspecified: Secondary | ICD-10-CM | POA: Diagnosis not present

## 2020-05-30 MED ORDER — DONEPEZIL HCL 10 MG PO TABS: 10.0000 mg | ORAL_TABLET | Freq: Every day | ORAL | 3 refills | Status: DC

## 2020-05-30 NOTE — Telephone Encounter (Signed)
RN from [facility] by the name of Meade Maw called about pt to discuss irregular heartbeat as well as other things that they saw regarding the patient. Will do other lab work with pt today. Please call Meade Maw to discuss at [phone].

## 2020-05-30 NOTE — Patient Instructions (Signed)
1. Continue Donepezil (Aricept) 10mg  daily, refills have been sent to [facility]  2. Continue to monitor driving  3. Follow-up in 6 months, call for any changes.  FALL PRECAUTIONS: Be cautious when walking. Scan the area for obstacles that may increase the risk of trips and falls. When getting up in the mornings, sit up at the edge of the bed for a few minutes before getting out of bed. Consider elevating the bed at the head end to avoid drop of blood pressure when getting up. Walk always in a well-lit room (use night lights in the walls). Avoid area rugs or power cords from appliances in the middle of the walkways. Use a walker or a cane if necessary and consider physical therapy for balance exercise. Get your eyesight checked regularly.  FINANCIAL OVERSIGHT: Supervision, especially oversight when making financial decisions or transactions is also recommended as difficulties arise.  HOME SAFETY: Consider the safety of the kitchen when operating appliances like stoves, microwave oven, and blender. Consider having supervision and share cooking responsibilities until no longer able to participate in those. Accidents with firearms and other hazards in the house should be identified and addressed as well.  DRIVING: Regarding driving, in patients with progressive memory problems, driving will be impaired. We advise to have someone else do the driving if trouble finding directions or if minor accidents are reported. Independent driving assessment is available to determine safety of driving.  ABILITY TO BE LEFT ALONE: If patient is unable to contact 911 operator, consider using LifeLine, or when the need is there, arrange for someone to stay with patients. Smoking is a fire hazard, consider supervision or cessation. Risk of wandering should be assessed by caregiver and if detected at any point, supervision and safe proof recommendations should be instituted.  MEDICATION SUPERVISION: Inability to  self-administer medication needs to be constantly addressed. Implement a mechanism to ensure safe administration of the medications.  RECOMMENDATIONS FOR ALL PATIENTS WITH MEMORY PROBLEMS: 1. Continue to exercise (Recommend 30 minutes of walking everyday, or 3 hours every week) 2. Increase social interactions - continue going to [place] and enjoy social gatherings with friends and family 3. Eat healthy, avoid fried foods and eat more fruits and vegetables 4. Maintain adequate blood pressure, blood sugar, and blood cholesterol level. Reducing the risk of stroke and cardiovascular disease also helps promoting better memory. 5. Avoid stressful situations. Live a simple life and avoid aggravations. Organize your time and prepare for the next day in anticipation. 6. Sleep well, avoid any interruptions of sleep and avoid any distractions in the bedroom that may interfere with adequate sleep quality 7. Avoid sugar, avoid sweets as there is a strong link between excessive sugar intake, diabetes, and cognitive impairment We discussed the Mediterranean diet, which has been shown to help patients reduce the risk of progressive memory disorders and reduces cardiovascular risk. This includes eating fish, eat fruits and green leafy vegetables, nuts like almonds and hazelnuts, walnuts, and also use olive oil. Avoid fast foods and fried foods as much as possible. Avoid sweets and sugar as sugar use has been linked to worsening of memory function.  There is always a concern of gradual progression of memory problems. If this is the case, then we may need to adjust level of care according to patient needs. Support, both to the patient and caregiver, should then be put into place.

## 2020-05-30 NOTE — Progress Notes (Signed)
NEUROLOGY FOLLOW UP OFFICE NOTE  [name] [id number] Dec 04, 1933  HISTORY OF PRESENT ILLNESS: I had the pleasure of seeing [name] in follow-up in the neurology clinic on 05/30/2020.  The patient was last seen 6 months ago for dementia. Her son [name] was on speakerphone to provide additional information. Records and images were personally reviewed where available. I personally reviewed MRI brain without contrast done January 2021 which did not show any acute changes. There was moderate cortical loss in the frontal and parietal lobes, scattered small hyperintensities in the bilateral white matter (mild chronic microvascular disease). She was started on Donepezil 47m daily on her last visit, she and her son report she is doing better. She states she can remember people's names better. She continues to live alone with her 2 dogs and denies forgetting to feed them. She continues to drive short distances and goes to temple daily without getting lost. On her last visit, she was using a pillpack, but reports today that she is back to putting her medications in a pill tray and denies forgetting medications. She denies any missed bills. She is independent with dressing and bathing. She has not left the stove on. She denies any headaches, dizziness, no falls. Sleep is good. She reports weight loss and loss of appetite, "I don't want to eat." She is taking mirtazapine 84mqhs.  History on Initial Assessment 11/30/2019: This is a pleasant 86ear old right-handed woman with a history of hypertension, hyperlipidemia, diabetes, cervical cancer, trigeminal neuralgia, presenting for evaluation of memory loss. She feels her memory changed 2-3 years ago where she has difficulty remembering names. Family started noticing changes in the past year. She lives alone and denies getting lost driving. She is very [place] [employer] and visits daily to help them, praying every Sunday. Her  son Tara Gullingeports she forgot how to get to the temple a few months ago. She denies missing bill payments. Tara Gullingeports her husband had managed the bills until he passed away 3 years ago. Tara [name] noticed she would not recall who she wrote checks for, and would not be aware of how much money is in her account, causing overdraws. She denies misplacing things, however Tara Gullingeports that they tried to return clothes last month and they could not find the card she used. She eventually found the card, but Tara Gullingound out she had opened up another new credit card account. Tara Gullingad expressed concern about medication compliance, her last HbA1c was 8.5. Medications were switched to a pillpack, which has helped, however she has a visiting nurse come twice a week who notes that she would have left over doses on the pillpack. She would not be able to verify which pouch she took them from. Family also noticed personality changes last Spring, she seemed to zone out during her granddaughter's graduation. She is usually bubbly and very social, they noticed she was less active, falling asleep and more tired. She is independent with dressing and bathing, no hygiene concerns. She has 2 dogs. She states her mood is okay and sleep is very good. There is no family history of dementia, no history of significant head injuries or alcohol use.  She denies any headaches, dizziness, dysarthria, dysphagia, neck/back pain, focal numbness/tingling/weakness, bowel/bladder dysfunction. No anosmia, tremors, no falls. Vision is sometimes blurred.  Laboratory Data: Lab Results  Component Value Date   TSH 1.94 04/25/2020   Lab Results  Component Value Date   [id number] 8916/11/2019  PAST MEDICAL HISTORY: Past Medical History:  Diagnosis Date  . At risk for medication noncompliance   . CAD (coronary artery disease)    a. borderline disease by CT 08/2018.  . Diabetes mellitus, type 2 ([place])   . History of cervical cancer   .  Hyperlipidemia   . Hypertension   . Moderate mitral regurgitation   . Noncompliance with medication treatment due to underuse of medication   . Raynaud's phenomenon   . TN (trigeminal neuralgia)     MEDICATIONS: Current Outpatient Medications on File Prior to Visit  Medication Sig Dispense Refill  . amLODipine (NORVASC) 5 MG tablet Take 1 tablet (5 mg total) by mouth daily. 90 tablet 3  . aspirin EC 81 MG tablet Take 1 tablet (81 mg total) by mouth at bedtime. 90 tablet 3  . atorvastatin (LIPITOR) 80 MG tablet Take 1 tablet (80 mg total) by mouth daily. 90 tablet 3  . Blood Glucose Monitoring Suppl (ONE TOUCH ULTRA MINI) w/Device KIT USE TO CHECK BLOOD SUGAR DAILY AND AS NEEDED 1 each 0  . carboxymethylcellulose (REFRESH PLUS) 0.5 % SOLN Place 1 drop into both eyes 2 (two) times daily.    [name] donepezil (ARICEPT) 10 MG tablet Take 10 mg by mouth at bedtime.    [name] glucose blood (ACCU-CHEK GUIDE) test strip Use as instructed 100 each 12  . isosorbide mononitrate (IMDUR) 120 MG 24 hr tablet Take 1 tablet (120 mg total) by mouth daily. 90 tablet 3  . Lancets (ONETOUCH ULTRASOFT) lancets Once daily or PRN, Dx E11.9 100 each 12  . lisinopril (ZESTRIL) 20 MG tablet Take 1 tablet (20 mg total) by mouth daily. 90 tablet 3  . metFORMIN (GLUCOPHAGE) 1000 MG tablet Take 1 tablet (1,000 mg total) by mouth 2 (two) times daily with a meal. 60 tablet 6  . mirtazapine (REMERON) 15 MG tablet Take 1 tablet (15 mg total) by mouth at bedtime. 90 tablet 1  . Multiple Vitamin (MULTIVITAMIN) capsule Take 1 capsule by mouth daily. 90 capsule 3  . Multiple Vitamins-Minerals (ONE DAILY PLUS MINERALS) TABS Take by mouth every morning.    . nitroGLYCERIN (NITROSTAT) 0.4 MG SL tablet PLACE 1 TABLET BY MOUTH UNDER THE TONGUE EVERY 5 MINUTES AS NEEDED FOR CHEST PAIN 50 tablet 3   No current facility-administered medications on file prior to visit.    ALLERGIES: Allergies  Allergen Reactions  . Morphine And Related  Itching    Short lived, mild itching.    FAMILY HISTORY: Family History  Problem Relation Age of Onset  . CAD Neg Hx   . Diabetes Mellitus II Neg Hx     SOCIAL HISTORY: Social History   Socioeconomic History  . Marital status: Widowed    Spouse name: [name]  . Number of children: 1  . Years of education: Not on file  . Highest education level: Not on file  Occupational History  . 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 Smoking status: Current Every Day Smoker    Packs/day: 1.50    Years: 60.00    Pack years: 90.00    Types: Cigarettes  . Smokeless tobacco: Never Used  Vaping Use  . Vaping Use: Never used  Substance and Sexual Activity  . Alcohol use: No  . Drug use: No  . Sexual activity: Not on file  Other Topics Concern  . Not on file  Social History Narrative   Married.  Lives in [place] with  her husband.  No FH of heart disease.   1 adult son   + smoker   No EtOH   Right handed    Social Determinants of Health   Financial Resource Strain:   . Difficulty of Paying Living Expenses:   Food Insecurity:   . Worried About Charity fundraiser in the Last Year:   . Arboriculturist in the Last Year:   Transportation Needs:   . Film/video editor (Medical):   [name] Lack of Transportation (Non-Medical):   Physical Activity:   . Days of Exercise per Week:   . Minutes of Exercise per Session:   Stress:   . Feeling of Stress :   Social Connections:   . Frequency of Communication with Friends and Family:   . Frequency of Social Gatherings with Friends and Family:   . Attends Religious Services:   . Active Member of Clubs or Organizations:   . Attends Archivist Meetings:   [name] Marital Status:   Intimate Partner Violence:   . Fear of Current or Ex-Partner:   . Emotionally Abused:   [name] Physically Abused:   . Sexually Abused:     PHYSICAL EXAM: Vitals:   05/30/20 1133  BP: (!) 164/76  Pulse: 69  SpO2: 99%   General: No  acute distress Head:  Normocephalic/atraumatic Skin/Extremities: No rash, no edema Neurological Exam: alert and oriented to person, place, year/season. Month is June. No aphasia or dysarthria. Fund of knowledge is appropriate.  Recent and remote memory are impaired.  Attention and concentration are normal.    Able to name objects and repeat phrases. MMSE 24/30 MMSE - Mini Mental State Exam 05/30/2020 09/22/2019  Orientation to time 2 3  Orientation to Place 5 5  Registration 3 3  Attention/ Calculation 5 0  Recall 1 3  Language- name 2 objects 2 2  Language- repeat 1 1  Language- follow 3 step command 3 3  Language- read & follow direction 1 1  Write a sentence 0 0  Copy design 1 0  Total score 24 21   Cranial nerves: Pupils equal, round, reactive to light.Extraocular movements intact with no nystagmus.No facial asymmetry.  Motor: moves all extremities symmetrically. Gait narrow-based and steady.   IMPRESSION: This is a pleasant 86 yo RH woman with a history of hypertension, hyperlipidemia, diabetes, cervical cancer, trigeminal neuralgia, with mild dementia, likely due to Alzheimer's disease. MRI brain showed moderate cortical loss in the frontal and parietal lobes, mild chronic microvascular disease. MMSE today 24/30. She is overall doing well, continue Donepezil 42m daily. Continue to monitor driving, family continues to closely monitor. We again discussed the importance of control of vascular risk factors, physical exercise, and brain stimulation exercises for brain health. Follow-up in 6 months, they know to call for any changes.   Thank you for allowing me to participate in her care.  Please do not hesitate to call for any questions or concerns.   KEllouise Newer M.D.   CC: Dr. ADeniece Ree

## 2020-05-30 NOTE — Telephone Encounter (Signed)
Spoke with Jenny Reichmann, RN from [facility]. She states that yesterday she went out to visit the patient and she noticed a very irregular heartbeat. The patient was afebrile, O2 was 98%, BP 143/71 and HR 85. Jenny Reichmann called her NP who advised her to recheck in the apical area. She rechecked and did not hear any irregularities. She states that they are having BMET, CBC, and Mag drawn today. Jenny Reichmann states that [facility] does not do 12 lead EKGs. Patient denies any symptoms and has not felt any palpitations. Jenny Reichmann calling to make [place] aware. Patient has an office visit scheduled with Richardson Dopp, PA-C on 07/16.

## 2020-06-08 NOTE — Progress Notes (Signed)
Cardiology Office Note:    Date:  06/09/2020   ID:  [name], DOB [birth year], MRN [id number]  PCP:  Isaac Bliss, Rayford Halsted, MD  Cardiologist:  [name], MD   Electrophysiologist:  None   Referring MD: Isaac Bliss, Estel*   Chief Complaint:  Follow-up (CAD)    Patient Profile:    [name] is a 86 y.o. female with:   Coronary artery disease ? Myoview 5/19 with anteroseptal and lateral ischemia ? CTA 10/19: Coronary calcium 662, moderate CAD in the LAD and LCx with borderline significance in the LCx by FFR>>medical therapy unless significant symptoms  Mitral regurgitation ? Moderate by echo 5/19  Carotid artery disease  Diabetes mellitus  Hypertension  Hyperlipidemia  Raynaud's disease  History of elevated LFTs with dilated common bile duct  Tobacco abuse  Trigeminal neuralgia  Medicationnon adherence  Dementia  Prior Cardiac Studies Coronary CTA 09/21/2018 IMPRESSION: 1. Coronary calcium score of 662. This was 36 percentile for age and sex matched control.  2. Normal coronary origin with right dominance.  3. Moderate CAD in the proximal and mid LAD and proximal and mid LCX arteries. Additional analysis with CT FFR will be submitted.  4. Severe mitral annular calcifications. FFR 1. Left Main: No significant stenosis.  2. LAD: Proximal: 0.92, distal: 0.80. 3. D1: 0.81. 4. LCX: Proximal: 0.98, distal: 0.78. 5. RCA: Proximal: 0.98, distal: 0.85. 6. PDA: 0.81.  IMPRESSION: 1. CT FFR analysis showed stenosis in the mid portion of a non-dominant LCX artery. A medical management would be recommended unless there are significant symptoms.  Carotid US 07/31/2018 Bilateral ICA 1-39  Myoview 04/15/2018 Large size, moderate severity partially reversible anteroseptal and septal perfusion defect and a medium size, moderate severity partially reversible perfusion defect of the lateral wall. These findings  are suggestive of ischemia, but artifact cannot be excluded. Clinical correlation is recommended and additional testing may be necessary.  Echocardiogram 03/25/2018 Mild concentric LVH, EF normal, normal wall motion, grade 1 diastolic dysfunction, MAC, moderate MR, mild LAE, PASP 32  History of Present Illness:    [name] was last seen in clinic by Dr. Radford Pax.  Her angina was improved, but she was still having some breakthrough symptoms.  Her Isosorbide was increased further.  She returns for follow up.   She is here alone.  We tried to reach her son by phone but had to leave a message.  She notes no further chest discomfort since last seen.  She has not had any significant change in her shortness of breath.  She has not had syncope, orthopnea.  She does have some dependent leg edema.  Past Medical History:  Diagnosis Date  . At risk for medication noncompliance   . CAD (coronary artery disease)    a. borderline disease by CT 08/2018.  . Diabetes mellitus, type 2 ([place])   . History of cervical cancer   . Hyperlipidemia   . Hypertension   . Moderate mitral regurgitation   . Noncompliance with medication treatment due to underuse of medication   . Raynaud's phenomenon   . TN (trigeminal neuralgia)     Current Medications: No outpatient medications have been marked as taking for the 06/09/20 encounter (Office Visit) with Richardson Dopp T, PA-C.     Allergies:   Morphine and related   Social History   Tobacco Use  . Smoking status: Current Every Day Smoker    Packs/day: 1.50    Years: 60.00    Pack years: 90.00  Types: Cigarettes  . Smokeless tobacco: Never Used  Vaping Use  . Vaping Use: Never used  Substance Use Topics  . Alcohol use: No  . Drug use: No     Family Hx: The patient's family history is negative for CAD and Diabetes Mellitus II.  ROS   EKGs/Labs/Other Test Reviewed:    EKG:  EKG is   ordered today.  The ekg ordered today demonstrates normal  sinus rhythm, left anterior fascicular block, HR 70, no significant ST-T wave changes, QTC 438  Recent Labs: 04/25/2020: ALT 14; BUN 16; Creatinine, Ser 0.85; Hemoglobin 15.4; Platelets 241.0; Potassium 4.0; Sodium 142; TSH 1.94   Recent Lipid Panel Lab Results  Component Value Date/Time   CHOL 158 04/25/2020 08:41 AM   CHOL 128 07/19/2019 08:33 AM   TRIG 139.0 04/25/2020 08:41 AM   HDL 57.60 04/25/2020 08:41 AM   HDL 58 07/19/2019 08:33 AM   CHOLHDL 3 04/25/2020 08:41 AM   LDLCALC 73 04/25/2020 08:41 AM   LDLCALC 40 07/19/2019 08:33 AM   LDLDIRECT 83.0 12/21/2014 10:40 AM    Physical Exam:    VS:  BP 140/60   Pulse 70   Ht 5' (1.524 m)   Wt 88 lb (39.9 kg)   SpO2 90%   BMI 17.19 kg/m     Wt Readings from Last 3 Encounters:  06/09/20 88 lb (39.9 kg)  05/30/20 90 lb 3.2 oz (40.9 kg)  05/11/20 92 lb 6.4 oz (41.9 kg)     Constitutional:      Appearance: Not in distress. Frail.  Neck:     Thyroid: No thyromegaly.     Vascular: JVD normal.  Pulmonary:     Breath sounds: No wheezing. No rales.     Comments: Decreased breath sounds Cardiovascular:     Normal rate. Regular rhythm. Normal S1. Normal S2.     Murmurs: There is no murmur.  Edema:    Peripheral edema absent.  Abdominal:     Palpations: Abdomen is soft. There is no hepatomegaly.  Skin:    General: Skin is warm and dry.  Neurological:     Mental Status: Alert and oriented to person, place and time.     Cranial Nerves: Cranial nerves are intact.       ASSESSMENT & PLAN:    1. Coronary artery disease involving native coronary artery of native heart with angina pectoris (HCC) Moderate CAD by coronary CTA in October 2019.  There is borderline significant stenosis in the mid LCx.  Medical therapy was recommended unless she has severe symptoms.    Her anginal symptoms are well controlled on her current therapy.  Continue current dose of amlodipine, aspirin, atorvastatin, isosorbide mononitrate.  Follow-up with  me or Dr. Radford Pax in 4 months  2. Essential hypertension Blood pressure somewhat elevated today.  She has not taken her medications yet.  I have asked her to continue to monitor blood pressure at home and notify us if her blood pressures are >130/80.  She has a nurse that comes out a few times a month.  3. Mixed hyperlipidemia Recent LDL close to goal.  Continue high intensity statin therapy.  4. Tobacco abuse She is not planning on quitting.     Dispo:  Return in about 4 months (around 10/10/2020) for Routine Follow Up, w/ Richardson Dopp, PA-C or Dr. Radford Pax, in person.   Medication Adjustments/Labs and Tests Ordered: Current medicines are reviewed at length with the patient today.  Concerns regarding medicines  are outlined above.  Tests Ordered: Orders Placed This Encounter  Procedures  . EKG 12-Lead   Medication Changes: No orders of the defined types were placed in this encounter.   Signed, Richardson Dopp, PA-C  06/09/2020 12:13 PM    [facility] [place], [place], [place]  [address] Phone: [phone]; Fax: [phone]

## 2020-06-09 ENCOUNTER — Encounter: Payer: Self-pay | Admitting: Physician Assistant

## 2020-06-09 ENCOUNTER — Other Ambulatory Visit: Payer: Self-pay

## 2020-06-09 ENCOUNTER — Ambulatory Visit: Payer: PPO | Admitting: Physician Assistant

## 2020-06-09 VITALS — BP 140/60 | HR 70 | Ht 60.0 in | Wt 88.0 lb

## 2020-06-09 DIAGNOSIS — I10 Essential (primary) hypertension: Secondary | ICD-10-CM

## 2020-06-09 DIAGNOSIS — I25119 Atherosclerotic heart disease of native coronary artery with unspecified angina pectoris: Secondary | ICD-10-CM | POA: Diagnosis not present

## 2020-06-09 DIAGNOSIS — Z720 Tobacco use: Secondary | ICD-10-CM | POA: Diagnosis not present

## 2020-06-09 DIAGNOSIS — E782 Mixed hyperlipidemia: Secondary | ICD-10-CM | POA: Diagnosis not present

## 2020-06-09 NOTE — Patient Instructions (Signed)
Medication Instructions:  Your physician recommends that you continue on your current medications as directed. Please refer to the Current Medication list given to you today.  *If you need a refill on your cardiac medications before your next appointment, please call your pharmacy*  Lab Work: None ordered today  If you have labs (blood work) drawn today and your tests are completely normal, you will receive your results only by: [name] MyChart Message (if you have MyChart) OR . A paper copy in the mail If you have any lab test that is abnormal or we need to change your treatment, we will call you to review the results.  Testing/Procedures: None ordered today  Follow-Up:  On 10/10/20 at 1:00PM with [name], MD  Other Instructions Have the home health nurse call if you blood pressure is consistently higher than 130/80

## 2020-07-11 ENCOUNTER — Ambulatory Visit (INDEPENDENT_AMBULATORY_CARE_PROVIDER_SITE_OTHER): Payer: PPO | Admitting: Internal Medicine

## 2020-07-11 ENCOUNTER — Encounter: Payer: Self-pay | Admitting: Internal Medicine

## 2020-07-11 ENCOUNTER — Other Ambulatory Visit: Payer: Self-pay

## 2020-07-11 VITALS — BP 120/70 | HR 86 | Temp 98.8°F | Wt 86.3 lb

## 2020-07-11 DIAGNOSIS — E785 Hyperlipidemia, unspecified: Secondary | ICD-10-CM

## 2020-07-11 DIAGNOSIS — R630 Anorexia: Secondary | ICD-10-CM | POA: Diagnosis not present

## 2020-07-11 DIAGNOSIS — Z720 Tobacco use: Secondary | ICD-10-CM

## 2020-07-11 DIAGNOSIS — I10 Essential (primary) hypertension: Secondary | ICD-10-CM | POA: Diagnosis not present

## 2020-07-11 DIAGNOSIS — E119 Type 2 diabetes mellitus without complications: Secondary | ICD-10-CM | POA: Diagnosis not present

## 2020-07-11 NOTE — Patient Instructions (Signed)
-  Nice seeing you today!!  -Try and eat a few more bites of every meal. Add a protein shake (like premier protein) to your daily calorie intake.  -Schedule follow up in 3 months.

## 2020-07-11 NOTE — Progress Notes (Signed)
Established Patient Office Visit     This visit occurred during the SARS-CoV-2 public health emergency.  Safety protocols were in place, including screening questions prior to the visit, additional usage of staff PPE, and extensive cleaning of exam room while observing appropriate contact time as indicated for disinfecting solutions.    CC/Reason for Visit: Follow-up chronic medical conditions  HPI: [name] is a 86 y.o. female who is coming in today for the above mentioned reasons. Past Medical History is significant for: Well-controlled hypertension and type 2 diabetes.  Mild dementia followed by neurology.  On Aricept.  She also has a history of coronary artery disease with intermittent chest pain for which she uses nitroglycerin.  She follows closely with cardiology for this.  She has been a heavy smoker her whole life and is still smoking about 15 cigarettes a day.  She continues to complain of decreased appetite.  She is down to 86.3 pounds.  She is already on Remeron.  She is taking a daily multivitamin.  She has no pain or difficulty swallowing.  She states she is eating rice and chicken or fish twice a day, no snacks in between, eating maybe half a bowl of rice with each meal.   Past Medical/Surgical History: Past Medical History:  Diagnosis Date  . At risk for medication noncompliance   . CAD (coronary artery disease)    a. borderline disease by CT 08/2018.  . Diabetes mellitus, type 2 ([place])   . History of cervical cancer   . Hyperlipidemia   . Hypertension   . Moderate mitral regurgitation   . Noncompliance with medication treatment due to underuse of medication   . Raynaud's phenomenon   . TN (trigeminal neuralgia)     Past Surgical History:  Procedure Laterality Date  . BREAST BIOPSY    . BREAST ENHANCEMENT SURGERY    . CHOLECYSTECTOMY N/A 02/14/2014   Procedure: LAPAROSCOPIC CHOLECYSTECTOMY;  Surgeon: Ralene Ok, MD;  Location: [place];   Service: General;  Laterality: N/A;  . ERCP N/A 03/08/2015   Procedure: ENDOSCOPIC RETROGRADE CHOLANGIOPANCREATOGRAPHY (ERCP);  Surgeon: Ladene Artist, MD;  Location: [facility];  Service: Endoscopy;  Laterality: N/A;  . MASS EXCISION Right 12/25/2016   Procedure: EXCISION RIGHT SCALP MASS SEBACEOUS CYST;  Surgeon: Coralie Keens, MD;  Location: [place];  Service: General;  Laterality: Right;  . TOTAL ABDOMINAL HYSTERECTOMY  1963   for cervical cancer in situ    Social History:  reports that she has been smoking cigarettes. She has a 90.00 pack-year smoking history. She has never used smokeless tobacco. She reports that she does not drink alcohol and does not use drugs.  Allergies: Allergies  Allergen Reactions  . Morphine And Related Itching    Short lived, mild itching.    Family History:  Family History  Problem Relation Age of Onset  . CAD Neg Hx   . Diabetes Mellitus II Neg Hx      Current Outpatient Medications:  .  amLODipine (NORVASC) 5 MG tablet, Take 1 tablet (5 mg total) by mouth daily., Disp: 90 tablet, Rfl: 3 .  aspirin EC 81 MG tablet, Take 1 tablet (81 mg total) by mouth at bedtime., Disp: 90 tablet, Rfl: 3 .  atorvastatin (LIPITOR) 80 MG tablet, Take 1 tablet (80 mg total) by mouth daily., Disp: 90 tablet, Rfl: 3 .  Blood Glucose Monitoring Suppl (ONE TOUCH ULTRA MINI) w/Device KIT, USE TO CHECK BLOOD SUGAR DAILY AND AS NEEDED,  Disp: 1 each, Rfl: 0 .  carboxymethylcellulose (REFRESH PLUS) 0.5 % SOLN, Place 1 drop into both eyes 2 (two) times daily., Disp: , Rfl:  .  donepezil (ARICEPT) 10 MG tablet, Take 1 tablet (10 mg total) by mouth at bedtime., Disp: 90 tablet, Rfl: 3 .  glucose blood (ACCU-CHEK GUIDE) test strip, Use as instructed, Disp: 100 each, Rfl: 12 .  isosorbide mononitrate (IMDUR) 120 MG 24 hr tablet, Take 1 tablet (120 mg total) by mouth daily., Disp: 90 tablet, Rfl: 3 .  Lancets (ONETOUCH ULTRASOFT) lancets, Once daily or PRN, Dx E11.9, Disp: 100  each, Rfl: 12 .  lisinopril (ZESTRIL) 20 MG tablet, Take 1 tablet (20 mg total) by mouth daily., Disp: 90 tablet, Rfl: 3 .  metFORMIN (GLUCOPHAGE) 1000 MG tablet, Take 1 tablet (1,000 mg total) by mouth 2 (two) times daily with a meal., Disp: 60 tablet, Rfl: 6 .  mirtazapine (REMERON) 15 MG tablet, Take 1 tablet (15 mg total) by mouth at bedtime., Disp: 90 tablet, Rfl: 1 .  Multiple Vitamin (MULTIVITAMIN) capsule, Take 1 capsule by mouth daily., Disp: 90 capsule, Rfl: 3 .  Multiple Vitamins-Minerals (ONE DAILY PLUS MINERALS) TABS, Take by mouth every morning., Disp: , Rfl:  .  nitroGLYCERIN (NITROSTAT) 0.4 MG SL tablet, PLACE 1 TABLET BY MOUTH UNDER THE TONGUE EVERY 5 MINUTES AS NEEDED FOR CHEST PAIN, Disp: 50 tablet, Rfl: 3  Review of Systems:  Constitutional: Denies fever, chills, diaphoresis and fatigue.  HEENT: Denies photophobia, eye pain, redness, hearing loss, ear pain, congestion, sore throat, rhinorrhea, sneezing, mouth sores, trouble swallowing, neck pain, neck stiffness and tinnitus.   Respiratory: Denies SOB, DOE, cough, chest tightness,  and wheezing.   Cardiovascular: Denies chest pain, palpitations and leg swelling.  Gastrointestinal: Denies nausea, vomiting, abdominal pain, diarrhea, constipation, blood in stool and abdominal distention.  Genitourinary: Denies dysuria, urgency, frequency, hematuria, flank pain and difficulty urinating.  Endocrine: Denies: hot or cold intolerance, sweats, changes in hair or nails, polyuria, polydipsia. Musculoskeletal: Denies myalgias, back pain, joint swelling, arthralgias and gait problem.  Skin: Denies pallor, rash and wound.  Neurological: Denies dizziness, seizures, syncope, weakness, light-headedness, numbness and headaches.  Hematological: Denies adenopathy. Easy bruising, personal or family bleeding history  Psychiatric/Behavioral: Denies suicidal ideation, mood changes, confusion, nervousness, sleep disturbance and  agitation    Physical Exam: Vitals:   07/11/20 0852  BP: 120/70  Pulse: 86  Temp: 98.8 F (37.1 C)  TempSrc: Oral  SpO2: 96%  Weight: 86 lb 4.8 oz (39.1 kg)    Body mass index is 16.85 kg/m.   Constitutional: NAD, calm, comfortable Eyes: PERRL, lids and conjunctivae normal ENMT: Mucous membranes are moist. Respiratory: clear to auscultation bilaterally, no wheezing, no crackles. Normal respiratory effort. No accessory muscle use.  Cardiovascular: Regular rate and rhythm, no murmurs / rubs / gallops. No extremity edema. Abdomen: no tenderness, no masses palpated. No hepatosplenomegaly. Bowel sounds positive.  Neurologic: Grossly intact and nonfocal Psychiatric: Normal judgment and insight. Alert and oriented x 3. Normal mood.    Impression and Plan:  Dyslipidemia -LDL is close to goal at 73.  Continue atorvastatin.  Diabetes mellitus type 2 in nonobese (HCC) -A1c was 6.2 in June.  Essential hypertension -Well-controlled.  Loss of appetite -She will continue to try and eat a few bites extra with each meal, advised to take a protein shake in between meals. -She had complete blood work-up last visit. -Definite concern for malignancy, I will refer her for low-dose CT scan  for lung cancer screening given her long history of tobacco use.   Patient Instructions  -Nice seeing you today!!  -Try and eat a few more bites of every meal. Add a protein shake (like premier protein) to your daily calorie intake.  -Schedule follow up in 3 months.     Lelon Frohlich, MD [facility]

## 2020-07-27 ENCOUNTER — Other Ambulatory Visit: Payer: Self-pay | Admitting: Internal Medicine

## 2020-07-27 DIAGNOSIS — R630 Anorexia: Secondary | ICD-10-CM

## 2020-07-27 DIAGNOSIS — Z720 Tobacco use: Secondary | ICD-10-CM

## 2020-08-09 ENCOUNTER — Telehealth: Payer: Self-pay | Admitting: Internal Medicine

## 2020-08-09 NOTE — Telephone Encounter (Signed)
Arnea from [facility] is calling in regards to the order for  CT chest lung cancer screening-the pt doesn't qualify due to age. They are able to do the CT chest screening w/o constrast but no the lung cancer screening   Pt appts is tomorrow so they need to know something before end of day  Arnea [phone]

## 2020-08-09 NOTE — Telephone Encounter (Signed)
Not sure what this means? She needs a CT chest with concern for potential lung malignancy.

## 2020-08-10 ENCOUNTER — Other Ambulatory Visit: Payer: Self-pay

## 2020-08-10 ENCOUNTER — Ambulatory Visit
Admission: RE | Admit: 2020-08-10 | Discharge: 2020-08-10 | Disposition: A | Discharge status: Home / Self Care | Payer: PPO | Source: Ambulatory Visit | Attending: Internal Medicine | Admitting: Internal Medicine

## 2020-08-10 DIAGNOSIS — I2510 Atherosclerotic heart disease of native coronary artery without angina pectoris: Secondary | ICD-10-CM | POA: Diagnosis not present

## 2020-08-10 DIAGNOSIS — I700 Atherosclerosis of aorta: Secondary | ICD-10-CM | POA: Diagnosis not present

## 2020-08-10 DIAGNOSIS — J439 Emphysema, unspecified: Secondary | ICD-10-CM | POA: Diagnosis not present

## 2020-08-10 DIAGNOSIS — R630 Anorexia: Secondary | ICD-10-CM

## 2020-08-10 DIAGNOSIS — K838 Other specified diseases of biliary tract: Secondary | ICD-10-CM | POA: Diagnosis not present

## 2020-08-10 DIAGNOSIS — Z720 Tobacco use: Secondary | ICD-10-CM

## 2020-08-10 NOTE — Telephone Encounter (Signed)
Dr Jerilee Hoh has addressed this.

## 2020-08-25 ENCOUNTER — Other Ambulatory Visit: Payer: Self-pay | Admitting: Physician Assistant

## 2020-08-25 ENCOUNTER — Other Ambulatory Visit: Payer: Self-pay | Admitting: Internal Medicine

## 2020-08-25 DIAGNOSIS — E119 Type 2 diabetes mellitus without complications: Secondary | ICD-10-CM

## 2020-09-19 ENCOUNTER — Ambulatory Visit: Payer: PPO

## 2020-09-19 ENCOUNTER — Other Ambulatory Visit (HOSPITAL_BASED_OUTPATIENT_CLINIC_OR_DEPARTMENT_OTHER): Payer: Self-pay | Admitting: Internal Medicine

## 2020-09-19 ENCOUNTER — Ambulatory Visit: Payer: PPO | Attending: Internal Medicine

## 2020-09-19 DIAGNOSIS — Z23 Encounter for immunization: Secondary | ICD-10-CM

## 2020-09-19 NOTE — Progress Notes (Signed)
° °  Covid-19 Vaccination Clinic  Name:  [name]    MRN: [id number] DOB: [birth year]  09/19/2020  [name] was observed post Covid-19 immunization for 15 minutes without incident. She was provided with Vaccine Information Sheet and instruction to access the V-Safe system. Vaccinated by [employer].  [name] was instructed to call 911 with any severe reactions post vaccine:  Difficulty breathing   Swelling of face and throat   A fast heartbeat   A bad rash all over body   Dizziness and weakness

## 2020-10-03 DIAGNOSIS — R4183 Borderline intellectual functioning: Secondary | ICD-10-CM | POA: Diagnosis not present

## 2020-10-03 DIAGNOSIS — R079 Chest pain, unspecified: Secondary | ICD-10-CM | POA: Diagnosis not present

## 2020-10-03 DIAGNOSIS — E119 Type 2 diabetes mellitus without complications: Secondary | ICD-10-CM | POA: Diagnosis not present

## 2020-10-03 DIAGNOSIS — N390 Urinary tract infection, site not specified: Secondary | ICD-10-CM | POA: Diagnosis not present

## 2020-10-03 DIAGNOSIS — I10 Essential (primary) hypertension: Secondary | ICD-10-CM | POA: Diagnosis not present

## 2020-10-10 ENCOUNTER — Telehealth: Payer: Self-pay | Admitting: Cardiology

## 2020-10-10 ENCOUNTER — Ambulatory Visit (INDEPENDENT_AMBULATORY_CARE_PROVIDER_SITE_OTHER): Payer: PPO | Admitting: Cardiology

## 2020-10-10 ENCOUNTER — Other Ambulatory Visit: Payer: Self-pay

## 2020-10-10 VITALS — BP 104/48 | HR 69 | Ht 60.0 in | Wt 81.6 lb

## 2020-10-10 DIAGNOSIS — I25119 Atherosclerotic heart disease of native coronary artery with unspecified angina pectoris: Secondary | ICD-10-CM | POA: Diagnosis not present

## 2020-10-10 DIAGNOSIS — I340 Nonrheumatic mitral (valve) insufficiency: Secondary | ICD-10-CM | POA: Diagnosis not present

## 2020-10-10 DIAGNOSIS — I10 Essential (primary) hypertension: Secondary | ICD-10-CM

## 2020-10-10 DIAGNOSIS — I6523 Occlusion and stenosis of bilateral carotid arteries: Secondary | ICD-10-CM | POA: Diagnosis not present

## 2020-10-10 DIAGNOSIS — Z720 Tobacco use: Secondary | ICD-10-CM

## 2020-10-10 DIAGNOSIS — E785 Hyperlipidemia, unspecified: Secondary | ICD-10-CM | POA: Diagnosis not present

## 2020-10-10 NOTE — Patient Instructions (Signed)

## 2020-10-10 NOTE — Telephone Encounter (Signed)
[name] is calling wanting to speak with the nurse to go over the occurrences during [name] appointment today just to make sure he is up to date. Please advise.

## 2020-10-10 NOTE — Addendum Note (Signed)
Addended by: Antonieta Iba on: 10/10/2020 01:17 PM   Modules accepted: Orders

## 2020-10-10 NOTE — Telephone Encounter (Signed)
Spoke with the patient's son and reviewed patient's office visit today.

## 2020-10-10 NOTE — Progress Notes (Signed)
Cardiology Office Note:    Date:  10/10/2020   ID:  [name], DOB [birth year], MRN [id number]  PCP:  Isaac Bliss, Rayford Halsted, MD  Cardiologist:  [name], MD    Referring MD: Isaac Bliss, [place]*   Chief Complaint  Patient presents with  . Coronary Artery Disease  . Hypertension  . Hyperlipidemia  . Mitral Regurgitation    History of Present Illness:    [name] is a 86 y.o. female with    Coronary artery disease ? Myoview 5/19 with anteroseptal and lateral ischemia ? CTA 10/19: Coronary calcium 662, moderate CAD in the LAD and LCx with borderline significance in the LCx by FFR>>medical therapy unless significant symptoms  Mitral regurgitation ? Moderate by echo 5/19  Carotid artery disease  Diabetes mellitus  Hypertension  Hyperlipidemia  Raynaud's disease  History of elevated LFTs with dilated common bile duct  Tobacco abuse  Trigeminal neuralgia  Medicationnon adherence  Dementia  Prior Cardiac Studies Coronary CTA 09/21/2018 IMPRESSION: 1. Coronary calcium score of 662. This was 39 percentile for age and sex matched control.  2. Normal coronary origin with right dominance.  3. Moderate CAD in the proximal and mid LAD and proximal and mid LCX arteries. Additional analysis with CT FFR will be submitted.  4. Severe mitral annular calcifications. FFR 1. Left Main: No significant stenosis.  2. LAD: Proximal: 0.92, distal: 0.80. 3. D1: 0.81. 4. LCX: Proximal: 0.98, distal: 0.78. 5. RCA: Proximal: 0.98, distal: 0.85. 6. PDA: 0.81.  IMPRESSION: 1. CT FFR analysis showed stenosis in the mid portion of a non-dominant LCX artery. A medical management would be recommended unless there are significant symptoms.  Carotid US 07/31/2018 Bilateral ICA 1-39  Myoview 04/15/2018 Large size, moderate severity partially reversible anteroseptal and septal perfusion defect and a medium size, moderate  severity partially reversible perfusion defect of the lateral wall. These findings are suggestive of ischemia, but artifact cannot be excluded. Clinical correlation is recommended and additional testing may be necessary.  Echocardiogram 03/25/2018 Mild concentric LVH, EF normal, normal wall motion, grade 1 diastolic dysfunction, MAC, moderate MR, mild LAE, PASP 32  History of Present Illness: The patient was last seen in clinic by Richardson Dopp, [place] in March 2021. The patient was seen by neurology in January and placed on Aricept for memory loss.  The patient notes that her memory continues to worsen.  She continues to smoke cigarettes (1 pack/day).  When seen by Nicki Reaper, she was still complaining of chest discomfort every several days that is relieved with SL NTG. Her Imdur was increased to 80m daily and then to 12105mdaily on subsequent OV.    SHe is here today for followup and is doing well.  She has chronic stable angina which is very well controlled on CCB and long acting nitrates.  She denies any SOB, DOE, PND, orthopnea, LE edema, dizziness, palpitations or syncope. She is compliant with her meds and is tolerating meds with no SE.    Past Medical History:  Diagnosis Date  . At risk for medication noncompliance   . CAD (coronary artery disease)    a. borderline disease by CT 08/2018.  . [place]  . History of cervical cancer   . Hyperlipidemia   . Hypertension   . Moderate mitral regurgitation   . Noncompliance with medication treatment due to underuse of medication   . Raynaud's phenomenon   . TN (trigeminal neuralgia)     Past Surgical History:  Procedure Laterality Date  . BREAST BIOPSY    . BREAST ENHANCEMENT SURGERY    . CHOLECYSTECTOMY N/A 02/14/2014   Procedure: LAPAROSCOPIC CHOLECYSTECTOMY;  Surgeon: Ralene Ok, MD;  Location: [place];  Service: General;  Laterality: N/A;  . ERCP N/A 03/08/2015   Procedure: ENDOSCOPIC RETROGRADE  CHOLANGIOPANCREATOGRAPHY (ERCP);  Surgeon: Ladene Artist, MD;  Location: [facility];  Service: Endoscopy;  Laterality: N/A;  . MASS EXCISION Right 12/25/2016   Procedure: EXCISION RIGHT SCALP MASS SEBACEOUS CYST;  Surgeon: Coralie Keens, MD;  Location: [place];  Service: General;  Laterality: Right;  . TOTAL ABDOMINAL HYSTERECTOMY  1963   for cervical cancer in situ    Current Medications: Current Meds  Medication Sig  . amLODipine (NORVASC) 5 MG tablet TAKE ONE TABLET BY MOUTH ONCE DAILY (AM)  . ASPIRIN LOW DOSE 81 MG EC tablet TAKE ONE TABLET BY MOUTH DAILY AT BEDTIME.  [name] atorvastatin (LIPITOR) 80 MG tablet TAKE ONE TABLET BY MOUTH ONCE DAILY (BEDTIME)  . Blood Glucose Monitoring Suppl (ONE TOUCH ULTRA MINI) w/Device KIT USE TO CHECK BLOOD SUGAR DAILY AND AS NEEDED  . carboxymethylcellulose (REFRESH PLUS) 0.5 % SOLN Place 1 drop into both eyes 2 (two) times daily.  [name] donepezil (ARICEPT) 10 MG tablet Take 1 tablet (10 mg total) by mouth at bedtime.  [name] glucose blood (ACCU-CHEK GUIDE) test strip Use as instructed  . isosorbide mononitrate (IMDUR) 120 MG 24 hr tablet Take 1 tablet (120 mg total) by mouth daily.  . Lancets (ONETOUCH ULTRASOFT) lancets Once daily or PRN, Dx E11.9  . lisinopril (ZESTRIL) 20 MG tablet TAKE ONE TABLET BY MOUTH ONCE DAILY (AM)  . metFORMIN (GLUCOPHAGE) 1000 MG tablet TAKE ONE TABLET BY MOUTH TWICE DAILY WITH A MEAL (AM AND BEDTIME)  . Multiple Vitamins-Minerals (ONE DAILY PLUS MINERALS) TABS TAKE 1 CAPSULE BY MOUTH DAILY (AM)  . nitroGLYCERIN (NITROSTAT) 0.4 MG SL tablet PLACE 1 TABLET (0.4 MG TOTAL) UNDER THE TONGUE EVERY 5 (FIVE) MINUTES AS NEEDED FOR CHEST PAIN.  [name] oxybutynin (DITROPAN) 5 MG tablet Take 5 mg by mouth 2 (two) times daily.     Allergies:   Morphine and related   Social History   Socioeconomic History  . Marital status: Widowed    Spouse name: [name]  . Number of children: 1  . Years of education: Not on file  . Highest education level:  Not on file  Occupational History  . 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 Smoking status: Current Every Day Smoker    Packs/day: 1.50    Years: 60.00    Pack years: 90.00    Types: Cigarettes  . Smokeless tobacco: Never Used  Vaping Use  . Vaping Use: Never used  Substance and Sexual Activity  . Alcohol use: No  . Drug use: No  . Sexual activity: Not on file  Other Topics Concern  . Not on file  Social History Narrative   Married.  Lives in [place] with her husband.  No FH of heart disease.   1 adult son   + smoker   No EtOH   Right handed    Social Determinants of Health   Financial Resource Strain:   . Difficulty of Paying Living Expenses: Not on file  Food Insecurity:   . Worried About Charity fundraiser in the Last Year: Not on file  . Ran Out of Food in the Last Year: Not on file  Transportation Needs:   .  Lack of Transportation (Medical): Not on file  . Lack of Transportation (Non-Medical): Not on file  Physical Activity:   . Days of Exercise per Week: Not on file  . Minutes of Exercise per Session: Not on file  Stress:   . Feeling of Stress : Not on file  Social Connections:   . Frequency of Communication with Friends and Family: Not on file  . Frequency of Social Gatherings with Friends and Family: Not on file  . Attends Religious Services: Not on file  . Active Member of Clubs or Organizations: Not on file  . Attends Archivist Meetings: Not on file  . Marital Status: Not on file     Family History: The patient's family history is negative for CAD and Diabetes Mellitus II.  ROS:   Please see the history of present illness.    ROS  All other systems reviewed and negative.   EKGs/Labs/Other Studies Reviewed:    The following studies were reviewed today:    EKG:  EKG is not ordered today.    Recent Labs: 04/25/2020: ALT 14; BUN 16; Creatinine, Ser 0.85; Hemoglobin 15.4; Platelets 241.0; Potassium 4.0;  Sodium 142; TSH 1.94   Recent Lipid Panel    Component Value Date/Time   CHOL 158 04/25/2020 0841   CHOL 128 07/19/2019 0833   TRIG 139.0 04/25/2020 0841   HDL 57.60 04/25/2020 0841   HDL 58 07/19/2019 0833   CHOLHDL 3 04/25/2020 0841   VLDL 27.8 04/25/2020 0841   LDLCALC 73 04/25/2020 0841   LDLCALC 40 07/19/2019 0833   LDLDIRECT 83.0 12/21/2014 1040    Physical Exam:    VS:  BP (!) 104/48   Pulse 69   Ht 5' (1.524 m)   Wt 81 lb 9.6 oz (37 kg)   SpO2 95%   BMI 15.94 kg/m     Wt Readings from Last 3 Encounters:  10/10/20 81 lb 9.6 oz (37 kg)  07/11/20 86 lb 4.8 oz (39.1 kg)  06/09/20 88 lb (39.9 kg)     GEN: Well nourished, well developed in no acute distress HEENT: Normal NECK: No JVD; No carotid bruits LYMPHATICS: No lymphadenopathy CARDIAC:RRR, no murmurs, rubs, gallops RESPIRATORY:  Clear to auscultation without rales, wheezing or rhonchi  ABDOMEN: Soft, non-tender, non-distended MUSCULOSKELETAL:  No edema; No deformity  SKIN: Warm and dry NEUROLOGIC:  Alert and oriented x 3 PSYCHIATRIC:  Normal affect    ASSESSMENT:    1. Coronary artery disease involving native coronary artery of native heart with angina pectoris ([place])   2. Essential hypertension   3. Hyperlipidemia, unspecified hyperlipidemia type   4. Tobacco abuse   5. Bilateral carotid artery stenosis   6. Moderate mitral regurgitation    PLAN:    In order of problems listed above:  1. Coronary artery disease involving native coronary artery of native heart with angina pectoris (HCC) -Moderate CAD by coronary CTA in October 2019 with borderline significant stenosis in the mid LCx.   -Medical therapy was recommended unless she has severe symptoms due to her significant dementia.   -she has chronic stable angina that is very stable on chronic long acting nitrates and amlodipne -she rarely will have CP and has not had to take any SL NTG -continue amlodipine 34m daily, ASA, Imdur 1276mdaily and  statin   2. Essential hypertension -BP is well controlled on exam -continue amlodipine 21m37maily, Lisinopril 72m89mily and Imdur -SCr stble at 0.85 and K+ 4 in June  2021  3. Hyperlipidemia, unspecified hyperlipidemia type -LDL goal < 70 -LDL was 73 in June 2021 -continue atorvastatin 14m daily  4. Tobacco abuse -again recommended tobacco cessation  5.  Carotid artery stenosis -1-39% bilateral by dopplers 07/2018 -continue ASA and statin  6.  Mitral regurgitation -2D echo 03/2018 with moderate MR -repeat 2D echo to make sure this is stable  Followup with me in 1 year  Medication Adjustments/Labs and Tests Ordered: Current medicines are reviewed at length with the patient today.  Concerns regarding medicines are outlined above.  No orders of the defined types were placed in this encounter.  No orders of the defined types were placed in this encounter.   Signed, TFransico Him MD  10/10/2020 1:15 PM    [facility]

## 2020-10-13 DIAGNOSIS — J984 Other disorders of lung: Secondary | ICD-10-CM | POA: Diagnosis not present

## 2020-10-21 DIAGNOSIS — R918 Other nonspecific abnormal finding of lung field: Secondary | ICD-10-CM | POA: Diagnosis not present

## 2020-11-09 ENCOUNTER — Other Ambulatory Visit: Payer: Self-pay

## 2020-11-09 ENCOUNTER — Ambulatory Visit (HOSPITAL_COMMUNITY): Payer: PPO | Attending: Cardiology

## 2020-11-09 DIAGNOSIS — I340 Nonrheumatic mitral (valve) insufficiency: Secondary | ICD-10-CM | POA: Diagnosis not present

## 2020-11-09 LAB — ECHOCARDIOGRAM COMPLETE
Area-P 1/2: 3.72 cm2
Radius: 0.5 cm
S' Lateral: 2.2 cm

## 2020-11-15 ENCOUNTER — Encounter: Payer: Self-pay | Admitting: Cardiology

## 2020-11-27 NOTE — Progress Notes (Unsigned)
Virtual Visit via Telephone Note   This visit type was conducted due to national recommendations for restrictions regarding the COVID-19 Pandemic (e.g. social distancing) in an effort to limit this patient's exposure and mitigate transmission in our community.  Due to her co-morbid illnesses, this patient is at least at moderate risk for complications without adequate follow up.  This format is felt to be most appropriate for this patient at this time.  The patient did not have access to video technology/had technical difficulties with video requiring transitioning to audio format only (telephone).  All issues noted in this document were discussed and addressed.  No physical exam could be performed with this format.  Please refer to the patient's chart for her  consent to telehealth for [facility].    Date:  11/28/2020   ID:  [name], DOB October 03, 1934, MRN [id number] The patient was identified using 2 identifiers.  Patient Location: Home Provider Location: Office/Clinic  PCP:  [name], Tara Halsted, MD  Cardiologist:  Tara Him, MD   Electrophysiologist:  None   Evaluation Performed:  Follow-Up Visit  Chief Complaint: Regurgitation; discussed TEE  Patient Profile: [name] is a 87 y.o. female with:  Coronary artery disease ? Myoview 5/19 with anteroseptal and lateral ischemia ? CTA 10/19: Coronary calcium 662, moderate CAD in the LAD and LCx with borderline significance in the LCx by FFR>>medical therapy unless significant symptoms  Mitral regurgitation ? 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 Coronary calcium score of 662. This was 87 percentile for age and sex matched control. 2. Normal coronary origin with right dominance. 3. Moderate CAD in the proximal and mid LAD and proximal and mid LCX arteries. Additional analysis with CT FFR will be submitted. 4. Severe mitral annular calcifications. FFR 1. Left Main: No significant stenosis. 2. LAD: Proximal: 0.92, distal: 0.80. 3. D1: 0.81. 4. LCX: Proximal: 0.98, distal: 0.78. 5. RCA: Proximal: 0.98, distal: 0.85. 6. PDA: 0.81.  IMPRESSION: 1. CT FFR analysis showed stenosis in the mid portion of a non-dominant LCX artery. A medical management would be recommended unless there are significant symptoms.  Carotid US 07/31/2018 Bilateral ICA 1-39  Myoview 04/15/2018 Large size, moderate severity partially reversible anteroseptal and septal perfusion defect and a medium size, moderate severity partially reversible perfusion defect of the lateral wall. These findings are suggestive of ischemia, but artifact cannot be excluded. Clinical correlation is recommended and additional testing may be necessary.  Echocardiogram 03/25/2018 Mild concentric LVH, EF normal, normal wall motion, grade 1 diastolic dysfunction, MAC, moderate MR, mild LAE, PASP 32   History of Present Illness:   [name] was last seen in clinic by [name] in 09/2020.  Her anginal symptoms are stable on her antianginal regimen.  Follow-up 2D echocardiogram was arranged to reassess her mitral regurgitation.  This suggested severe mitral regurgitation.  [name] has recommended proceeding with a transesophageal echocardiogram.  She is seen today to discuss the procedure and make arrangements for scheduling. Her son [name] was also on the  call. [name] is on file. The patient has  remained stable since last seen. She has rare chest discomfort for which she takes nitroglycerin. This may occur once a week or less. She does get short of breath with some activities. She has not had orthopnea, leg edema or syncope.   Past Medical History:  Diagnosis Date  . At risk for medication noncompliance   . CAD (coronary artery disease)    a. borderline disease by CT 08/2018.  . Diabetes mellitus, type 2 ([place])   . History of cervical cancer   . Hyperlipidemia   . Hypertension   . Mitral regurgitation    severe MR by echo 10/2020  . Noncompliance with medication treatment due to underuse of medication   . Raynaud's phenomenon   . TN (trigeminal neuralgia)    Past Surgical History:  Procedure Laterality Date  . BREAST BIOPSY    . BREAST ENHANCEMENT SURGERY    . CHOLECYSTECTOMY N/A 02/14/2014   Procedure: LAPAROSCOPIC CHOLECYSTECTOMY;  Surgeon: Ralene Ok, MD;  Location: [place];  Service: General;  Laterality: N/A;  . ERCP N/A 03/08/2015   Procedure: ENDOSCOPIC RETROGRADE CHOLANGIOPANCREATOGRAPHY (ERCP);  Surgeon: Ladene Artist, MD;  Location: [facility];  Service: Endoscopy;  Laterality: N/A;  . MASS EXCISION Right 12/25/2016   Procedure: EXCISION RIGHT SCALP MASS SEBACEOUS CYST;  Surgeon: Coralie Keens, MD;  Location: [place];  Service: General;  Laterality: Right;  . TOTAL ABDOMINAL HYSTERECTOMY  1963   for cervical cancer in situ     Current Meds  Medication Sig  . amLODipine (NORVASC) 5 MG tablet TAKE ONE TABLET BY MOUTH ONCE DAILY (AM)  . Ascorbic Acid (VITAMIN C) 1000 MG tablet Take 1,000 mg by mouth daily.  [name] aspirin 81 MG EC tablet Take 1 tablet (81 mg total) by mouth daily. Swallow whole.  [name] atorvastatin (LIPITOR) 80 MG tablet TAKE ONE TABLET BY MOUTH ONCE DAILY (BEDTIME)  . Blood Glucose Monitoring Suppl (ONE TOUCH ULTRA MINI) w/Device KIT USE TO CHECK BLOOD SUGAR DAILY AND AS NEEDED  . cholecalciferol (VITAMIN D3) 25 MCG (1000 UNIT) tablet Take 1,000  Units by mouth daily.  [name] donepezil (ARICEPT) 10 MG tablet Take 1 tablet (10 mg total) by mouth at bedtime.  [name] glucose blood (ACCU-CHEK GUIDE) test strip Use as instructed  . isosorbide mononitrate (IMDUR) 120 MG 24 hr tablet Take 1 tablet (120 mg total) by mouth daily.  . Lancets (ONETOUCH ULTRASOFT) lancets Once daily or PRN, Dx E11.9  . lisinopril (ZESTRIL) 20 MG tablet TAKE ONE TABLET BY MOUTH ONCE DAILY (AM)  . metFORMIN (GLUCOPHAGE) 1000 MG tablet TAKE ONE TABLET BY MOUTH TWICE DAILY WITH A MEAL (AM AND BEDTIME)  . Multiple Vitamins-Minerals (ONE DAILY PLUS MINERALS) TABS TAKE 1 CAPSULE BY MOUTH DAILY (AM)  . nitroGLYCERIN (NITROSTAT) 0.4 MG SL tablet PLACE 1 TABLET (0.4 MG TOTAL) UNDER THE TONGUE EVERY 5 (FIVE) MINUTES AS NEEDED FOR CHEST PAIN.  [name] oxybutynin (DITROPAN) 5 MG tablet Take 5 mg by mouth 2 (two) times daily.  . [DISCONTINUED] ASPIRIN LOW DOSE 81 MG EC tablet TAKE ONE TABLET BY MOUTH DAILY AT BEDTIME.     Allergies:   Shrimp (diagnostic) and Morphine and related   Social History   Tobacco Use  . Smoking status: Current Every Day Smoker    Packs/day: 1.50    Years: 60.00    Pack years: 90.00    Types: Cigarettes  . Smokeless tobacco: Never Used  Vaping Use  . Vaping  Use: Never used  Substance Use Topics  . Alcohol use: No  . Drug use: No     Family Hx: The patient's family history is negative for CAD and Diabetes Mellitus II.  ROS:   Please see the history of present illness.    She has not had fever, chills, cough, melena, hematochezia, hematuria. All other systems reviewed and are negative.     Labs/Other Tests and Data Reviewed:    EKG:  No ECG reviewed.  Recent Labs: 04/25/2020: ALT 14; BUN 16; Creatinine, Ser 0.85; Hemoglobin 15.4; Platelets 241.0; Potassium 4.0; Sodium 142; TSH 1.94   Recent Lipid Panel Lab Results  Component Value Date/Time   CHOL 158 04/25/2020 08:41 AM   CHOL 128 07/19/2019 08:33 AM   TRIG 139.0 04/25/2020 08:41 AM   HDL  57.60 04/25/2020 08:41 AM   HDL 58 07/19/2019 08:33 AM   CHOLHDL 3 04/25/2020 08:41 AM   LDLCALC 73 04/25/2020 08:41 AM   LDLCALC 40 07/19/2019 08:33 AM   LDLDIRECT 83.0 12/21/2014 10:40 AM    Wt Readings from Last 3 Encounters:  11/28/20 85 lb (38.6 kg)  10/10/20 81 lb 9.6 oz (37 kg)  07/11/20 86 lb 4.8 oz (39.1 kg)     Risk Assessment/Calculations:      Objective:    Vital Signs:  Ht _0  (1.473 m)   Wt 85 lb (38.6 kg)   BMI 17.77 kg/m    VITAL SIGNS:  reviewed GEN:  no acute distress RESPIRATORY:  No labored breathing PSYCH:  normal affect  ASSESSMENT & PLAN:    1. Severe mitral regurgitation Recent echocardiogram demonstrated myxomatous mitral valve with severe regurgitation. [name] has recommended proceeding with transesophageal echocardiogram to better assess her mitral valve. I did briefly discuss with the patient and her son the possibility for corrective procedures if her mitral regurgitation is truly severe. We discussed the advantages of procedures such as MitraClip in terms of recovery. We will arrange her TEE sometime in the next 1-2 weeks.  2. Coronary artery disease involving native coronary artery of native heart with angina pectoris (HCC) Moderate CAD by coronary CTA in October 2019. There is borderline significant stenosis in the mid LCx. She is managed medically. She is currently doing well with rare anginal symptoms on her current regimen. Continue current therapy.  3. Essential hypertension She has not obtained a blood pressure recently. She does have a nurse that comes to her house and checks her blood pressure. Her son notes that her blood pressure is usually optimally controlled.  Shared Decision Making/Informed Consent The risks [esophageal damage, perforation (1:10,000 risk), bleeding, pharyngeal hematoma as well as other potential complications associated with conscious sedation including aspiration, arrhythmia, respiratory failure and  death], benefits (treatment guidance and diagnostic support) and alternatives of a transesophageal echocardiogram were discussed in detail with [name] and she is willing to proceed.     Time:   Today, I have spent 33 minutes with the patient with telehealth technology discussing the above problems.     Medication Adjustments/Labs and Tests Ordered: Current medicines are reviewed at length with the patient today.  Concerns regarding medicines are outlined above.   Tests Ordered: No orders of the defined types were placed in this encounter.   Medication Changes: Meds ordered this encounter  Medications  . aspirin 81 MG EC tablet    Sig: Take 1 tablet (81 mg total) by mouth daily. Swallow whole.    Dispense:  30 tablet  Refill:  12    Order Specific Question:   Supervising Provider    Answer:   Lelon Perla [[id number]]    Follow Up:  In Person in 2 week(s)  Signed, Richardson Dopp, PA-C  11/28/2020 5:18 PM    [facility]

## 2020-11-27 NOTE — H&P (View-Only) (Signed)
 Virtual Visit via Telephone Note   This visit type was conducted due to national recommendations for restrictions regarding the COVID-19 Pandemic (e.g. social distancing) in an effort to limit this patient's exposure and mitigate transmission in our community.  Due to her co-morbid illnesses, this patient is at least at moderate risk for complications without adequate follow up.  This format is felt to be most appropriate for this patient at this time.  The patient did not have access to video technology/had technical difficulties with video requiring transitioning to audio format only (telephone).  All issues noted in this document were discussed and addressed.  No physical exam could be performed with this format.  Please refer to the patient's chart for her  consent to telehealth for [facility].    Date:  11/28/2020   ID:  [name], DOB 06/18/1934, MRN [id number] The patient was identified using 2 identifiers.  Patient Location: Home Provider Location: Office/Clinic  PCP:  Tara Acosta, Estela Y, MD  Cardiologist:  Tara Turner, MD   Electrophysiologist:  None   Evaluation Performed:  Follow-Up Visit  Chief Complaint: Regurgitation; discussed TEE  Patient Profile: [name] is a 86 Wright.o. female with:  Coronary artery disease ? Myoview 5/19 with anteroseptal and lateral ischemia ? CTA 10/19: Coronary calcium 662, moderate CAD in the LAD and LCx with borderline significance in the LCx by FFR>>medical therapy unless significant symptoms  Mitral regurgitation ? 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 Coronary calcium score of 662. This was 82 percentile for age and sex matched control. 2. Normal coronary origin with right dominance. 3. Moderate CAD in the proximal and mid LAD and proximal and mid LCX arteries. Additional analysis with CT FFR will be submitted. 4. Severe mitral annular calcifications. FFR 1. Left Main: No significant stenosis. 2. LAD: Proximal: 0.92, distal: 0.80. 3. D1: 0.81. 4. LCX: Proximal: 0.98, distal: 0.78. 5. RCA: Proximal: 0.98, distal: 0.85. 6. PDA: 0.81.  IMPRESSION: 1. CT FFR analysis showed stenosis in the mid portion of a non-dominant LCX artery. A medical management would be recommended unless there are significant symptoms.  Carotid US 07/31/2018 Bilateral ICA 1-39  Myoview 04/15/2018 Large size, moderate severity partially reversible anteroseptal and septal perfusion defect and a medium size, moderate severity partially reversible perfusion defect of the lateral wall. These findings are suggestive of ischemia, but artifact cannot be excluded. Clinical correlation is recommended and additional testing may be necessary.  Echocardiogram 03/25/2018 Mild concentric LVH, EF normal, normal wall motion, grade 1 diastolic dysfunction, MAC, moderate MR, mild LAE, PASP 32   History of Present Illness:   [name] was last seen in clinic by Dr. Turner in 09/2020.  Her anginal symptoms are stable on her antianginal regimen.  Follow-up 2D echocardiogram was arranged to reassess her mitral regurgitation.  This suggested severe mitral regurgitation.  Dr. Turner has recommended proceeding with a transesophageal echocardiogram.  She is seen today to discuss the procedure and make arrangements for scheduling. Her son [name] was also on the   call. DPR is on file. The patient has  remained stable since last seen. She has rare chest discomfort for which she takes nitroglycerin. This may occur once a week or less. She does get short of breath with some activities. She has not had orthopnea, leg edema or syncope.   Past Medical History:  Diagnosis Date  . At risk for medication noncompliance   . CAD (coronary artery disease)    a. borderline disease by CT 08/2018.  . Diabetes mellitus, type 2 (HCC)   . History of cervical cancer   . Hyperlipidemia   . Hypertension   . Mitral regurgitation    severe MR by echo 10/2020  . Noncompliance with medication treatment due to underuse of medication   . Raynaud's phenomenon   . TN (trigeminal neuralgia)    Past Surgical History:  Procedure Laterality Date  . BREAST BIOPSY    . BREAST ENHANCEMENT SURGERY    . CHOLECYSTECTOMY N/A 02/14/2014   Procedure: LAPAROSCOPIC CHOLECYSTECTOMY;  Surgeon: Armando Ramirez, MD;  Location: [facility];  Service: General;  Laterality: N/A;  . ERCP N/A 03/08/2015   Procedure: ENDOSCOPIC RETROGRADE CHOLANGIOPANCREATOGRAPHY (ERCP);  Surgeon: Malcolm T Stark, MD;  Location: [facility];  Service: Endoscopy;  Laterality: N/A;  . MASS EXCISION Right 12/25/2016   Procedure: EXCISION RIGHT SCALP MASS SEBACEOUS CYST;  Surgeon: Douglas Blackman, MD;  Location: [facility];  Service: General;  Laterality: Right;  . TOTAL ABDOMINAL HYSTERECTOMY  1963   for cervical cancer in situ     Current Meds  Medication Sig  . amLODipine (NORVASC) 5 MG tablet TAKE ONE TABLET BY MOUTH ONCE DAILY (AM)  . Ascorbic Acid (VITAMIN C) 1000 MG tablet Take 1,000 mg by mouth daily.  . aspirin 81 MG EC tablet Take 1 tablet (81 mg total) by mouth daily. Swallow whole.  . atorvastatin (LIPITOR) 80 MG tablet TAKE ONE TABLET BY MOUTH ONCE DAILY (BEDTIME)  . Blood Glucose Monitoring Suppl (ONE TOUCH ULTRA MINI) w/Device KIT USE TO CHECK BLOOD SUGAR DAILY AND AS NEEDED  . cholecalciferol (VITAMIN D3) 25 MCG (1000 UNIT) tablet Take 1,000  Units by mouth daily.  . donepezil (ARICEPT) 10 MG tablet Take 1 tablet (10 mg total) by mouth at bedtime.  . glucose blood (ACCU-CHEK GUIDE) test strip Use as instructed  . isosorbide mononitrate (IMDUR) 120 MG 24 hr tablet Take 1 tablet (120 mg total) by mouth daily.  . Lancets (ONETOUCH ULTRASOFT) lancets Once daily or PRN, Dx E11.9  . lisinopril (ZESTRIL) 20 MG tablet TAKE ONE TABLET BY MOUTH ONCE DAILY (AM)  . metFORMIN (GLUCOPHAGE) 1000 MG tablet TAKE ONE TABLET BY MOUTH TWICE DAILY WITH A MEAL (AM AND BEDTIME)  . Multiple Vitamins-Minerals (ONE DAILY PLUS MINERALS) TABS TAKE 1 CAPSULE BY MOUTH DAILY (AM)  . nitroGLYCERIN (NITROSTAT) 0.4 MG SL tablet PLACE 1 TABLET (0.4 MG TOTAL) UNDER THE TONGUE EVERY 5 (FIVE) MINUTES AS NEEDED FOR CHEST PAIN.  . oxybutynin (DITROPAN) 5 MG tablet Take 5 mg by mouth 2 (two) times daily.  . [DISCONTINUED] ASPIRIN LOW DOSE 81 MG EC tablet TAKE ONE TABLET BY MOUTH DAILY AT BEDTIME.     Allergies:   Shrimp (diagnostic) and Morphine and related   Social History   Tobacco Use  . Smoking status: Current Every Day Smoker    Packs/day: 1.50    Years: 60.00    Pack years: 90.00    Types: Cigarettes  . Smokeless tobacco: Never Used  Vaping Use  . Vaping   Use: Never used  Substance Use Topics  . Alcohol use: No  . Drug use: No     Family Hx: The patient's family history is negative for CAD and Diabetes Mellitus II.  ROS:   Please see the history of present illness.    She has not had fever, chills, cough, melena, hematochezia, hematuria. All other systems reviewed and are negative.     Labs/Other Tests and Data Reviewed:    EKG:  No ECG reviewed.  Recent Labs: 04/25/2020: ALT 14; BUN 16; Creatinine, Ser 0.85; Hemoglobin 15.4; Platelets 241.0; Potassium 4.0; Sodium 142; TSH 1.94   Recent Lipid Panel Lab Results  Component Value Date/Time   CHOL 158 04/25/2020 08:41 AM   CHOL 128 07/19/2019 08:33 AM   TRIG 139.0 04/25/2020 08:41 AM   HDL  57.60 04/25/2020 08:41 AM   HDL 58 07/19/2019 08:33 AM   CHOLHDL 3 04/25/2020 08:41 AM   LDLCALC 73 04/25/2020 08:41 AM   LDLCALC 40 07/19/2019 08:33 AM   LDLDIRECT 83.0 12/21/2014 10:40 AM    Wt Readings from Last 3 Encounters:  11/28/20 85 lb (38.6 kg)  10/10/20 81 lb 9.6 oz (37 kg)  07/11/20 86 lb 4.8 oz (39.1 kg)     Risk Assessment/Calculations:      Objective:    Vital Signs:  Ht 4' 10" (1.473 m)   Wt 85 lb (38.6 kg)   BMI 17.77 kg/m    VITAL SIGNS:  reviewed GEN:  no acute distress RESPIRATORY:  No labored breathing PSYCH:  normal affect  ASSESSMENT & PLAN:    1. Severe mitral regurgitation Recent echocardiogram demonstrated myxomatous mitral valve with severe regurgitation. Dr. Turner has recommended proceeding with transesophageal echocardiogram to better assess her mitral valve. I did briefly discuss with the patient and her son the possibility for corrective procedures if her mitral regurgitation is truly severe. We discussed the advantages of procedures such as MitraClip in terms of recovery. We will arrange her TEE sometime in the next 1-2 weeks.  2. Coronary artery disease involving native coronary artery of native heart with angina pectoris (HCC) Moderate CAD by coronary CTA in October 2019. There is borderline significant stenosis in the mid LCx. She is managed medically. She is currently doing well with rare anginal symptoms on her current regimen. Continue current therapy.  3. Essential hypertension She has not obtained a blood pressure recently. She does have a nurse that comes to her house and checks her blood pressure. Her son notes that her blood pressure is usually optimally controlled.  Shared Decision Making/Informed Consent The risks [esophageal damage, perforation (1:10,000 risk), bleeding, pharyngeal hematoma as well as other potential complications associated with conscious sedation including aspiration, arrhythmia, respiratory failure and  death], benefits (treatment guidance and diagnostic support) and alternatives of a transesophageal echocardiogram were discussed in detail with [name] and she is willing to proceed.     Time:   Today, I have spent 33 minutes with the patient with telehealth technology discussing the above problems.     Medication Adjustments/Labs and Tests Ordered: Current medicines are reviewed at length with the patient today.  Concerns regarding medicines are outlined above.   Tests Ordered: No orders of the defined types were placed in this encounter.   Medication Changes: Meds ordered this encounter  Medications  . aspirin 81 MG EC tablet    Sig: Take 1 tablet (81 mg total) by mouth daily. Swallow whole.    Dispense:  30 tablet      Refill:  12    Order Specific Question:   Supervising Provider    Answer:   CRENSHAW, BRIAN S [[id number]]    Follow Up:  In Person in 2 week(s)  Signed, Hayli Milligan, PA-C  11/28/2020 5:18 PM    [facility] 

## 2020-11-28 ENCOUNTER — Telehealth: Payer: Self-pay

## 2020-11-28 ENCOUNTER — Telehealth (INDEPENDENT_AMBULATORY_CARE_PROVIDER_SITE_OTHER): Payer: PPO | Admitting: Physician Assistant

## 2020-11-28 ENCOUNTER — Encounter: Payer: Self-pay | Admitting: Physician Assistant

## 2020-11-28 ENCOUNTER — Other Ambulatory Visit: Payer: Self-pay

## 2020-11-28 ENCOUNTER — Telehealth: Payer: Self-pay | Admitting: Physician Assistant

## 2020-11-28 VITALS — Ht <= 58 in | Wt 85.0 lb

## 2020-11-28 DIAGNOSIS — I10 Essential (primary) hypertension: Secondary | ICD-10-CM

## 2020-11-28 DIAGNOSIS — I340 Nonrheumatic mitral (valve) insufficiency: Secondary | ICD-10-CM

## 2020-11-28 DIAGNOSIS — I25119 Atherosclerotic heart disease of native coronary artery with unspecified angina pectoris: Secondary | ICD-10-CM

## 2020-11-28 MED ORDER — ASPIRIN 81 MG PO TBEC: 81.0000 mg | DELAYED_RELEASE_TABLET | Freq: Every day | ORAL | 12 refills | Status: DC

## 2020-11-28 NOTE — Patient Instructions (Addendum)
Medication Instructions:  Your physician recommends that you continue on your current medications as directed. Please refer to the Current Medication list given to you today.  *If you need a refill on your cardiac medications before your next appointment, please call your pharmacy*  Lab Work: Your physician recommends that you return for lab work on  Testing/Procedures: You are scheduled for a TEE/Cardioversion/TEE Cardioversion on 12/12/20 with Dr. Mayford Knife. Please arrive at the [facility] (Main Entrance A) at [facility]: [address] [place], [place] [address] at 8:30AM.   DIET: Nothing to eat or drink after midnight except a sip of water with medications (see medication instructions below)  You must have a responsible person to drive you home and stay in the waiting area during your procedure. Failure to do so could result in cancellation.  Bring your insurance cards.  *Special Note: Every effort is made to have your procedure done on time. Occasionally there are emergencies that occur at the hospital that may cause delays. Please be patient if a delay does occur.   Due to recent COVID-19 restrictions implemented by our local and state authorities and in an effort to keep both patients and staff as safe as possible, our hospital system requires COVID-19 testing prior to certain scheduled hospital procedures.  Please go to [address] [facility]. [place], [place] [address] on 12/08/20 at 10:05AM.  This is a drive up testing site.  You will not need to exit your vehicle.  You will not be billed at the time of testing but may receive a bill later depending on your insurance. You must agree to self-quarantine from the time of your testing until the procedure date on 12/12/20. This should included staying home with ONLY the people you live with.  Avoid take-out, grocery store shopping or leaving the house for any non-emergent reason.  Failure to have your COVID-19 test done on the date and time you  have been scheduled will result in cancellation of your procedure.  Please call our office at [phone] if you have any questions.   Follow-Up: On 12/27/20 at 2:00PM with Armanda Magic, MD

## 2020-11-28 NOTE — Telephone Encounter (Signed)
  Patient Consent for Virtual Visit         [name] has provided verbal consent on 11/28/2020 for a virtual visit (video or telephone).   CONSENT FOR VIRTUAL VISIT FOR:  [name]  By participating in this virtual visit I agree to the following:  I hereby voluntarily request, consent and authorize [facility] and its employed or contracted physicians, physician assistants, nurse practitioners or other licensed health care professionals (the Practitioner), to provide me with telemedicine health care services (the "Services") as deemed necessary by the treating Practitioner. I acknowledge and consent to receive the Services by the Practitioner via telemedicine. I understand that the telemedicine visit will involve communicating with the Practitioner through live audiovisual communication technology and the disclosure of certain medical information by electronic transmission. I acknowledge that I have been given the opportunity to request an in-person assessment or other available alternative prior to the telemedicine visit and am voluntarily participating in the telemedicine visit.  I understand that I have the right to withhold or withdraw my consent to the use of telemedicine in the course of my care at any time, without affecting my right to future care or treatment, and that the Practitioner or I may terminate the telemedicine visit at any time. I understand that I have the right to inspect all information obtained and/or recorded in the course of the telemedicine visit and may receive copies of available information for a reasonable fee.  I understand that some of the potential risks of receiving the Services via telemedicine include:  [name] Delay or interruption in medical evaluation due to technological equipment failure or disruption; . Information transmitted may not be sufficient (e.g. poor resolution of images) to allow for appropriate medical decision making by  the Practitioner; and/or  . In rare instances, security protocols could fail, causing a breach of personal health information.  Furthermore, I acknowledge that it is my responsibility to provide information about my medical history, conditions and care that is complete and accurate to the best of my ability. I acknowledge that Practitioner's advice, recommendations, and/or decision may be based on factors not within their control, such as incomplete or inaccurate data provided by me or distortions of diagnostic images or specimens that may result from electronic transmissions. I understand that the practice of medicine is not an exact science and that Practitioner makes no warranties or guarantees regarding treatment outcomes. I acknowledge that a copy of this consent can be made available to me via my patient portal [facility] MyChart), or I can request a printed copy by calling the office of [facility].    I understand that my insurance will be billed for this visit.   I have read or had this consent read to me. . I understand the contents of this consent, which adequately explains the benefits and risks of the Services being provided via telemedicine.  . I have been provided ample opportunity to ask questions regarding this consent and the Services and have had my questions answered to my satisfaction. . I give my informed consent for the services to be provided through the use of telemedicine in my medical care

## 2020-11-28 NOTE — Telephone Encounter (Signed)
I called patients son Rosanne Ashing and left a message to return my call. Patient needs to be set up for labs, pre procedure COVID test, and follow up after TEE.

## 2020-11-29 NOTE — Addendum Note (Signed)
Addended byAlben Spittle, Lorin Picket T on: 11/29/2020 07:55 AM   Modules accepted: Orders, SmartSet

## 2020-11-30 ENCOUNTER — Other Ambulatory Visit: Payer: Self-pay

## 2020-11-30 DIAGNOSIS — I340 Nonrheumatic mitral (valve) insufficiency: Secondary | ICD-10-CM

## 2020-11-30 NOTE — Telephone Encounter (Signed)
I called and spoke with patients son Rosanne Ashing, he is aware of TEE instructions and date. Covid test, pre procedure labs, and follow up scheduled. Rosanne Ashing verbalized understanding of instructions and will call back with any questions.

## 2020-12-08 ENCOUNTER — Other Ambulatory Visit: Payer: PPO | Admitting: *Deleted

## 2020-12-08 ENCOUNTER — Other Ambulatory Visit (HOSPITAL_COMMUNITY)
Admission: RE | Admit: 2020-12-08 | Discharge: 2020-12-08 | Disposition: A | Discharge status: Home / Self Care | Payer: PPO | Source: Ambulatory Visit | Attending: Cardiology | Admitting: Cardiology

## 2020-12-08 DIAGNOSIS — Z01812 Encounter for preprocedural laboratory examination: Secondary | ICD-10-CM | POA: Insufficient documentation

## 2020-12-08 DIAGNOSIS — Z20822 Contact with and (suspected) exposure to covid-19: Secondary | ICD-10-CM | POA: Diagnosis not present

## 2020-12-08 DIAGNOSIS — I340 Nonrheumatic mitral (valve) insufficiency: Secondary | ICD-10-CM | POA: Diagnosis not present

## 2020-12-08 LAB — SARS CORONAVIRUS 2 (TAT 6-24 HRS): SARS Coronavirus 2: NEGATIVE

## 2020-12-09 LAB — BASIC METABOLIC PANEL
BUN/Creatinine Ratio: 19 (ref 12–28)
BUN: 21 mg/dL (ref 8–27)
CO2: 22 mmol/L (ref 20–29)
Calcium: 9.6 mg/dL (ref 8.7–10.3)
Chloride: 108 mmol/L — ABNORMAL HIGH (ref 96–106)
Creatinine, Ser: 1.1 mg/dL — ABNORMAL HIGH (ref 0.57–1.00)
GFR calc Af Amer: 53 mL/min/{1.73_m2} — ABNORMAL LOW (ref >59)
GFR calc non Af Amer: 46 mL/min/{1.73_m2} — ABNORMAL LOW (ref >59)
Glucose: 164 mg/dL — ABNORMAL HIGH (ref 65–99)
Potassium: 4.4 mmol/L (ref 3.5–5.2)
Sodium: 146 mmol/L — ABNORMAL HIGH (ref 134–144)

## 2020-12-12 ENCOUNTER — Encounter (HOSPITAL_COMMUNITY)
Admission: RE | Disposition: A | Discharge status: Home / Self Care | Payer: Self-pay | Source: Home / Self Care | Attending: Cardiology

## 2020-12-12 ENCOUNTER — Ambulatory Visit (HOSPITAL_COMMUNITY): Payer: PPO | Admitting: Anesthesiology

## 2020-12-12 ENCOUNTER — Ambulatory Visit (HOSPITAL_BASED_OUTPATIENT_CLINIC_OR_DEPARTMENT_OTHER): Payer: PPO

## 2020-12-12 ENCOUNTER — Ambulatory Visit (HOSPITAL_COMMUNITY)
Admission: RE | Admit: 2020-12-12 | Discharge: 2020-12-12 | Disposition: A | Discharge status: Home / Self Care | Payer: PPO | Attending: Cardiology | Admitting: Cardiology

## 2020-12-12 ENCOUNTER — Encounter (HOSPITAL_COMMUNITY): Payer: Self-pay | Admitting: Cardiology

## 2020-12-12 ENCOUNTER — Other Ambulatory Visit: Payer: Self-pay

## 2020-12-12 DIAGNOSIS — Z79899 Other long term (current) drug therapy: Secondary | ICD-10-CM | POA: Insufficient documentation

## 2020-12-12 DIAGNOSIS — I081 Rheumatic disorders of both mitral and tricuspid valves: Secondary | ICD-10-CM | POA: Insufficient documentation

## 2020-12-12 DIAGNOSIS — I340 Nonrheumatic mitral (valve) insufficiency: Secondary | ICD-10-CM

## 2020-12-12 DIAGNOSIS — Z885 Allergy status to narcotic agent status: Secondary | ICD-10-CM | POA: Insufficient documentation

## 2020-12-12 DIAGNOSIS — Z91013 Allergy to seafood: Secondary | ICD-10-CM | POA: Diagnosis not present

## 2020-12-12 DIAGNOSIS — F17210 Nicotine dependence, cigarettes, uncomplicated: Secondary | ICD-10-CM | POA: Insufficient documentation

## 2020-12-12 DIAGNOSIS — Z7982 Long term (current) use of aspirin: Secondary | ICD-10-CM | POA: Diagnosis not present

## 2020-12-12 DIAGNOSIS — I10 Essential (primary) hypertension: Secondary | ICD-10-CM | POA: Diagnosis not present

## 2020-12-12 DIAGNOSIS — I2510 Atherosclerotic heart disease of native coronary artery without angina pectoris: Secondary | ICD-10-CM | POA: Diagnosis not present

## 2020-12-12 DIAGNOSIS — I700 Atherosclerosis of aorta: Secondary | ICD-10-CM | POA: Insufficient documentation

## 2020-12-12 DIAGNOSIS — I25119 Atherosclerotic heart disease of native coronary artery with unspecified angina pectoris: Secondary | ICD-10-CM | POA: Diagnosis not present

## 2020-12-12 DIAGNOSIS — Z7984 Long term (current) use of oral hypoglycemic drugs: Secondary | ICD-10-CM | POA: Insufficient documentation

## 2020-12-12 DIAGNOSIS — E119 Type 2 diabetes mellitus without complications: Secondary | ICD-10-CM | POA: Diagnosis not present

## 2020-12-12 HISTORY — PX: TEE WITHOUT CARDIOVERSION: SHX5443

## 2020-12-12 LAB — GLUCOSE, CAPILLARY
Glucose-Capillary: 57 mg/dL — ABNORMAL LOW (ref 70–99)
Glucose-Capillary: 126 mg/dL — ABNORMAL HIGH (ref 70–99)

## 2020-12-12 LAB — ECHO TEE: Radius: 0.3 cm

## 2020-12-12 SURGERY — ECHOCARDIOGRAM, TRANSESOPHAGEAL
Anesthesia: Monitor Anesthesia Care

## 2020-12-12 MED ORDER — DEXMEDETOMIDINE (PRECEDEX) IN NS 20 MCG/5ML (4 MCG/ML) IV SYRINGE
PREFILLED_SYRINGE | INTRAVENOUS | Status: DC | PRN
  Administered 2020-12-12: 4 ug via INTRAVENOUS

## 2020-12-12 MED ORDER — PHENYLEPHRINE 40 MCG/ML (10ML) SYRINGE FOR IV PUSH (FOR BLOOD PRESSURE SUPPORT)
PREFILLED_SYRINGE | INTRAVENOUS | Status: DC | PRN
  Administered 2020-12-12: 80 ug via INTRAVENOUS

## 2020-12-12 MED ORDER — LIDOCAINE 2% (20 MG/ML) 5 ML SYRINGE
INTRAMUSCULAR | Status: DC | PRN
  Administered 2020-12-12: 80 mg via INTRAVENOUS

## 2020-12-12 MED ORDER — PROPOFOL 10 MG/ML IV BOLUS
INTRAVENOUS | Status: DC | PRN
  Administered 2020-12-12: 20 mg via INTRAVENOUS
  Administered 2020-12-12: 15 mg via INTRAVENOUS

## 2020-12-12 MED ORDER — DEXTROSE 50 % IV SOLN
12.5000 g | INTRAVENOUS | Status: AC
  Administered 2020-12-12: 12.5 g via INTRAVENOUS

## 2020-12-12 MED ORDER — DEXTROSE 50 % IV SOLN
INTRAVENOUS | Status: AC
  Filled 2020-12-12: qty 50

## 2020-12-12 MED ORDER — SODIUM CHLORIDE 0.9 % IV SOLN: INTRAVENOUS | Status: DC

## 2020-12-12 MED ORDER — PROPOFOL 500 MG/50ML IV EMUL
INTRAVENOUS | Status: DC | PRN
  Administered 2020-12-12: 75 ug/kg/min via INTRAVENOUS

## 2020-12-12 NOTE — Anesthesia Preprocedure Evaluation (Addendum)
Anesthesia Evaluation  Patient identified by MRN, date of birth, ID band Patient awake    Reviewed: Allergy & Precautions, NPO status , Patient's Chart, lab work & pertinent test results  Airway Mallampati: II  TM Distance: >3 FB Neck ROM: Full    Dental  (+) Dental Advisory Given, Partial Lower, Partial Upper, Caps   Pulmonary Current Smoker and Patient abstained from smoking.,  12/08/2020 SARS coronavirus NEG   Pulmonary exam normal breath sounds clear to auscultation       Cardiovascular hypertension, Pt. 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   Neuro/Psych Trigeminal neuralgia  Neuromuscular disease    GI/Hepatic   Endo/Other  diabetes, Type 2, Oral Hypoglycemic AgentsRaynaud's  Renal/GU      Musculoskeletal   Abdominal   Peds  Hematology   Anesthesia Other Findings   Reproductive/Obstetrics                            Anesthesia Physical Anesthesia Plan  ASA: III  Anesthesia Plan: MAC   Post-op Pain Management:    Induction:   PONV Risk Score and Plan: 1 and Treatment may vary due to age or medical condition  Airway Management Planned: Natural Airway and Nasal Cannula  Additional Equipment: None  Intra-op Plan:   Post-operative Plan:   Informed Consent: I have reviewed the patients History and Physical, chart, labs and discussed the procedure including the risks, benefits and alternatives for the proposed anesthesia with the patient or authorized representative who has indicated his/her understanding and acceptance.     Dental advisory given  Plan Discussed with: CRNA  Anesthesia Plan Comments:        Anesthesia Quick Evaluation

## 2020-12-12 NOTE — Interval H&P Note (Signed)
History and Physical Interval Note:  12/12/2020 9:37 AM  [name]  has presented today for surgery, with the diagnosis of SEVER MITRO REGURGITATION.  The various methods of treatment have been discussed with the patient and family. After consideration of risks, benefits and other options for treatment, the patient has consented to  Procedure(s): TRANSESOPHAGEAL ECHOCARDIOGRAM (TEE) (N/A) as a surgical intervention.  The patient's history has been reviewed, patient examined, no change in status, stable for surgery.  I have reviewed the patient's chart and labs.  Questions were answered to the patient's satisfaction.     [name]

## 2020-12-12 NOTE — CV Procedure (Addendum)
    PROCEDURE NOTE:  Procedure:  Transesophageal echocardiogram Operator:  [name], MD Indications:  Mitral Regurgitation Complications: None  During this procedure the patient is administered a total of Propofol 96 mg to achieve and maintain moderate conscious sedation and 80 mg LIdocaine.  The patient's heart rate, blood pressure, and oxygen saturation are monitored continuously during the procedure by anesthesia.   Results: Normal LV size and function Normal RV size and function Normal RA Mildly dilated LA Normal TV with trivial TR Normal PV with trivial PR Normal MV with no evidence of Mitral valve prolapse and moderate mitral regurgitation.  Normal trileaflet AV Normal interatrial septum with no evidence of shunt by colorflow dopper  Moderate atherosclerosis of the thoracic and ascending aorta.  The patient tolerated the procedure well and was transferred back to their room in stable condition.  Signed: [name], MD [facility]

## 2020-12-12 NOTE — Anesthesia Procedure Notes (Signed)
Procedure Name: MAC Date/Time: 12/12/2020 10:14 AM Performed by: Rande Brunt, CRNA Pre-anesthesia Checklist: Patient identified, Emergency Drugs available, Suction available, Patient being monitored and Timeout performed Patient Re-evaluated:Patient Re-evaluated prior to induction Oxygen Delivery Method: Nasal cannula Preoxygenation: Pre-oxygenation with 100% oxygen Induction Type: IV induction Placement Confirmation: positive ETCO2 Dental Injury: Teeth and Oropharynx as per pre-operative assessment

## 2020-12-12 NOTE — Discharge Instructions (Signed)
Electrical Cardioversion TEE  YOU HAD AN CARDIAC PROCEDURE TODAY: Refer to the procedure report and other information in the discharge instructions given to you for any specific questions about what was found during the examination. If this information does not answer your questions, please call [facility] office at [phone] to clarify.   DIET: Your first meal following the procedure should be a light meal and then it is ok to progress to your normal diet. A half-sandwich or bowl of soup is an example of a good first meal. Heavy or fried foods are harder to digest and may make you feel nauseous or bloated. Drink plenty of fluids but you should avoid alcoholic beverages for 24 hours. If you had a esophageal dilation, please see attached instructions for diet.   ACTIVITY: Your care partner should take you home directly after the procedure. You should plan to take it easy, moving slowly for the rest of the day. You can resume normal activity the day after the procedure however YOU SHOULD NOT DRIVE, use power tools, machinery or perform tasks that involve climbing or major physical exertion for 24 hours (because of the sedation medicines used during the test).   SYMPTOMS TO REPORT IMMEDIATELY: A cardiologist can be reached at any hour. Please call [phone] for any of the following symptoms:  Vomiting of blood or coffee ground material  New, significant abdominal pain  New, significant chest pain or pain under the shoulder blades  Painful or persistently difficult swallowing  New shortness of breath  Black, tarry-looking or red, bloody stools  FOLLOW UP:  Please also call with any specific questions about appointments or follow up tests.

## 2020-12-12 NOTE — Anesthesia Postprocedure Evaluation (Signed)
Anesthesia Post Note  Patient: [name]  Procedure(s) Performed: TRANSESOPHAGEAL ECHOCARDIOGRAM (TEE) (N/A )     Patient location during evaluation: Endoscopy Anesthesia Type: MAC Level of consciousness: awake and alert Pain management: pain level controlled Vital Signs Assessment: post-procedure vital signs reviewed and stable Respiratory status: spontaneous breathing, nonlabored ventilation, respiratory function stable and patient connected to nasal cannula oxygen Cardiovascular status: stable and blood pressure returned to baseline Postop Assessment: no apparent nausea or vomiting Anesthetic complications: no   No complications documented.  Last Vitals:  Vitals:   12/12/20 1031 12/12/20 1040  BP: (!) 135/45 (!) 147/50  Pulse: 64 65  Resp: 20 19  Temp: 36.4 C   SpO2: 96% 95%    Last Pain:  Vitals:   12/12/20 1057  TempSrc:   PainSc: 0-No pain                 Catalina Gravel

## 2020-12-12 NOTE — Transfer of Care (Signed)
Immediate Anesthesia Transfer of Care Note  Patient: [name]  Procedure(s) Performed: TRANSESOPHAGEAL ECHOCARDIOGRAM (TEE) (N/A )  Patient Location: Endoscopy Unit  Anesthesia Type:MAC  Level of Consciousness: drowsy, patient cooperative and responds to stimulation  Airway & Oxygen Therapy: Patient Spontanous Breathing and Patient connected to nasal cannula oxygen  Post-op Assessment: Report given to RN, Post -op Vital signs reviewed and stable and Patient moving all extremities X 4  Post vital signs: Reviewed and stable  Last Vitals:  Vitals Value Taken Time  BP 135/45   Temp    Pulse 68   Resp 12   SpO2 97     Last Pain:  Vitals:   12/12/20 0949  TempSrc: Axillary  PainSc: 0-No pain         Complications: No complications documented.

## 2020-12-12 NOTE — Progress Notes (Signed)
Hypoglycemic Event  CBG: 56  Treatment: D50 25 mL (12.5 gm)  Symptoms: None  Follow-up CBG: Time  CBG Result:  Possible Reasons for Event: Inadequate meal intake  Comments/MD notified:Turk    [employer]

## 2020-12-12 NOTE — Progress Notes (Signed)
  Echocardiogram Echocardiogram Transesophageal has been performed.  [name] 12/12/2020, 10:35 AM

## 2020-12-13 ENCOUNTER — Encounter (HOSPITAL_COMMUNITY): Payer: Self-pay | Admitting: Cardiology

## 2020-12-26 ENCOUNTER — Telehealth: Payer: Self-pay

## 2020-12-26 NOTE — Telephone Encounter (Signed)
The patient's son, [name] [facility]), called to give verbal consent for virtual visit tomorrow. He will not be with his mom during the visit. He requests a call to his phone [phone]) and then he will three-way call her for the visit.  Informed him that many providers are using MyChart only, but this message will be sent to Dr. Theodosia Blender nurse for follow-up. He states if Dr. Radford Pax will only do a MyChart message (and not a phone call), they are happy to reschedule to an in-person visit later.  He understands Dr. Theodosia Blender nurse will call back.

## 2020-12-26 NOTE — Telephone Encounter (Signed)
Spoke with the patient's son and he states that there is no way for his mom to do a video visit. He states he would like to keep the appointment tomorrow and can schedule and in office visit at a later date if it is needed.

## 2020-12-27 ENCOUNTER — Other Ambulatory Visit: Payer: Self-pay

## 2020-12-27 ENCOUNTER — Telehealth: Payer: PPO | Admitting: Cardiology

## 2021-01-01 ENCOUNTER — Other Ambulatory Visit: Payer: Self-pay

## 2021-01-01 ENCOUNTER — Ambulatory Visit: Payer: PPO | Admitting: Neurology

## 2021-01-01 ENCOUNTER — Encounter: Payer: Self-pay | Admitting: Neurology

## 2021-01-01 VITALS — BP 147/66 | HR 100 | Ht <= 58 in | Wt 82.6 lb

## 2021-01-01 DIAGNOSIS — F0390 Unspecified dementia without behavioral disturbance: Secondary | ICD-10-CM

## 2021-01-01 DIAGNOSIS — F03A0 Unspecified dementia, mild, without behavioral disturbance, psychotic disturbance, mood disturbance, and anxiety: Secondary | ICD-10-CM

## 2021-01-01 MED ORDER — DONEPEZIL HCL 10 MG PO TABS: 10.0000 mg | ORAL_TABLET | Freq: Every day | ORAL | 3 refills | Status: DC

## 2021-01-01 NOTE — Progress Notes (Signed)
NEUROLOGY FOLLOW UP OFFICE NOTE  [name] [id number] [birth year]  HISTORY OF PRESENT ILLNESS: I had the pleasure of seeing [name] in follow-up in the neurology clinic on 01/01/2021.  The patient was last seen 7 months ago for mild dementia. Her son [name] is again present on speakerphone to provide additional information.  Records and images were personally reviewed where available.  MMSE 24/30 in 05/2020. She is on Donepezil 58m daily without side effects. Since her last visit, she feels her memory is okay. Her son states he does not know if it is any worse, she has good and bad days. She has forgotten prior conversations, for instance [place], [place] came the next day, she was surprised to see him. He calls to remind her about appointments the day prior, her brother HWarnell Foresteralso helps and comes daily. She has a pill dispenser with an alarm twice a day. She mostly does well, but he has noticed that when it needs to be refilled, there are a few days where some or all are still in the slot. He is working with a friend to install CCTVs in her home to monitor her. She reports getting lost one time 2 months ago on a [place] road [place] Her drivers license is coming up for renewal this month. She does not go to a lot of places and does well driving locally. She denies missing bill payments. No hygiene concerns, she is independent with dressing and bathing. Her brother does the cooking. No personality changes, paranoia or hallucinations. She denies any headaches, dizziness, focal numbness/tingling/weakness, no falls. Sleep is good. She says she went to the gym last week, JClair Gullingreminds her she has not been recently, she thinks it was closed but he corrects her. She reports that for the past 3-4 days, the tip of her tongue has been burning/feeling hot. She denies any difficulty swallowing. Her body feels hot.     History on Initial Assessment 11/30/2019: This is a pleasant 87year old right-handed woman with a history of hypertension, hyperlipidemia, diabetes, cervical cancer, trigeminal neuralgia, presenting for evaluation of memory loss. She feels her memory changed 2-3 years ago where she has difficulty remembering names. Family started noticing changes in the past year. She lives alone and denies getting lost driving. She is very active [place] and visits daily to help them, praying every Sunday. Her son JClair Gullingreports she forgot how to get to the temple a few months ago. She denies missing bill payments. JClair Gullingreports her husband had managed the bills until he passed away 3 years ago. JClair Gullinghas noticed she would not recall who she wrote checks for, and would not be aware of how much money is in her account, causing overdraws. She denies misplacing things, however JClair Gullingreports that they tried to return clothes last month and they could not find the card she used. She eventually found the card, but JClair Gullingfound out she had opened up another new credit card account. JClair Gullinghad expressed concern about medication compliance, her last HbA1c was 8.5. Medications were switched to a pillpack, which has helped, however she has a visiting nurse come twice a week who notes that she would have left over doses on the pillpack. She would not be able to verify which pouch she took them from. Family also noticed personality changes last Spring, she seemed to zone out during  her granddaughter's graduation. She is usually bubbly and very social, they noticed she was less active, falling asleep and more tired. She is independent with dressing and bathing, no hygiene concerns. She has 2 dogs. She states her mood is okay and sleep is very good. There is no family history of dementia, no history of significant head injuries or alcohol use.  She denies any headaches, dizziness, dysarthria, dysphagia, neck/back pain, focal  numbness/tingling/weakness, bowel/bladder dysfunction. No anosmia, tremors, no falls. Vision is sometimes blurred.  Laboratory Data: Lab Results  Component Value Date   TSH 1.94 04/25/2020   Lab Results  Component Value Date   [id number] 04/25/2020   MRI brain without contrast in January 2021 did not show any acute changes. There was moderate cortical loss in the frontal and parietal lobes, scattered small hyperintensities in the bilateral white matter (mild chronic microvascular disease).  PAST MEDICAL HISTORY: Past Medical History:  Diagnosis Date  . At risk for medication noncompliance   . CAD (coronary artery disease)    a. borderline disease by CT 08/2018.  . Diabetes mellitus, type 2 ([place])   . History of cervical cancer   . Hyperlipidemia   . Hypertension   . Mitral regurgitation    severe MR by echo 10/2020 // TEE 1/22: EF 60-65, normal RVSF, mild LAE, mod MR  . Noncompliance with medication treatment due to underuse of medication   . Raynaud's phenomenon   . TN (trigeminal neuralgia)     MEDICATIONS: Current Outpatient Medications on File Prior to Visit  Medication Sig Dispense Refill  . amLODipine (NORVASC) 5 MG tablet TAKE ONE TABLET BY MOUTH ONCE DAILY (AM) (Patient taking differently: Take 5 mg by mouth daily.) 90 tablet 3  . aspirin 81 MG EC tablet Take 1 tablet (81 mg total) by mouth daily. Swallow whole. 30 tablet 12  . atorvastatin (LIPITOR) 80 MG tablet TAKE ONE TABLET BY MOUTH ONCE DAILY (BEDTIME) (Patient taking differently: Take 80 mg by mouth daily.) 90 tablet 3  . Blood Glucose Monitoring Suppl (ONE TOUCH ULTRA MINI) w/Device KIT USE TO CHECK BLOOD SUGAR DAILY AND AS NEEDED 1 each 0  . cholecalciferol (VITAMIN D3) 25 MCG (1000 UNIT) tablet Take 1,000 Units by mouth in the morning and at bedtime.    . donepezil (ARICEPT) 10 MG tablet Take 1 tablet (10 mg total) by mouth at bedtime. 90 tablet 3  . glucose blood (ACCU-CHEK GUIDE) test strip Use as  instructed 100 each 12  . isosorbide mononitrate (IMDUR) 120 MG 24 hr tablet Take 1 tablet (120 mg total) by mouth daily. 90 tablet 3  . Lancets (ONETOUCH ULTRASOFT) lancets Once daily or PRN, Dx E11.9 100 each 12  . lisinopril (ZESTRIL) 20 MG tablet TAKE ONE TABLET BY MOUTH ONCE DAILY (AM) (Patient taking differently: Take 20 mg by mouth daily.) 90 tablet 3  . metFORMIN (GLUCOPHAGE) 1000 MG tablet TAKE ONE TABLET BY MOUTH TWICE DAILY WITH A MEAL (AM AND BEDTIME) (Patient taking differently: Take 1,000 mg by mouth 2 (two) times daily with a meal.) 180 tablet 1  . Multiple Vitamins-Minerals (ONE DAILY PLUS MINERALS) TABS TAKE 1 CAPSULE BY MOUTH DAILY (AM) (Patient taking differently: Take 1 tablet by mouth daily.) 90 tablet 1  . nitroGLYCERIN (NITROSTAT) 0.4 MG SL tablet PLACE 1 TABLET (0.4 MG TOTAL) UNDER THE TONGUE EVERY 5 (FIVE) MINUTES AS NEEDED FOR CHEST PAIN. (Patient taking differently: Place 0.4 mg under the tongue every 5 (five) minutes as needed for chest  pain.) 50 tablet 3  . oxybutynin (DITROPAN) 5 MG tablet Take 5 mg by mouth 2 (two) times daily.     No current facility-administered medications on file prior to visit.    ALLERGIES: Allergies  Allergen Reactions  . Shrimp (Diagnostic) Itching  . Morphine And Related Itching    Short lived, mild itching.    FAMILY HISTORY: Family History  Problem Relation Age of Onset  . CAD Neg Hx   . Diabetes Mellitus II Neg Hx     SOCIAL HISTORY: Social History   Socioeconomic History  . Marital status: Widowed    Spouse name: [name]  . Number of children: 1  . Years of education: Not on file  . Highest education level: Not on file  Occupational History  . 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 Smoking status: Current Every Day Smoker    Packs/day: 1.50    Years: 60.00    Pack years: 90.00    Types: Cigarettes  . Smokeless tobacco: Never Used  Vaping Use  . Vaping Use: Never used  Substance and  Sexual Activity  . Alcohol use: No  . Drug use: No  . Sexual activity: Not on file  Other Topics Concern  . Not on file  Social History Narrative   Married.  Lives in [place] with her husband.  No FH of heart disease.   1 adult son   + smoker   No EtOH   Right handed    Social Determinants of Health   Financial Resource Strain: Not on file  Food Insecurity: Not on file  Transportation Needs: Not on file  Physical Activity: Not on file  Stress: Not on file  Social Connections: Not on file  Intimate Partner Violence: Not on file     PHYSICAL EXAM: Vitals:   01/01/21 1010  BP: (!) 147/66  Pulse: 100  SpO2: 98%   General: No acute distress Head:  Normocephalic/atraumatic Skin/Extremities: No rash, no edema Neurological Exam: alert and oriented to person, place, and time. No aphasia or dysarthria. Fund of knowledge is appropriate.  Recent and remote memory are impaired. Attention and concentration are normal.  MMSE 25/30 MMSE - Mini Mental State Exam 01/01/2021 05/30/2020 09/22/2019  Orientation to time _0 Orientation to Place _1 Registration _2 Attention/ Calculation 5 5 0  Recall 0 1 3  Language- name 2 objects _3 Language- repeat _4 Language- follow 3 step command _5 Language- read & follow direction _6 Write a sentence 0 0 0  Copy design 1 1 0  Total score _7 Cranial nerves: Pupils equal, round. Extraocular movements intact.  No facial asymmetry.  Motor: moves all extremities symmetrically. Gait slow and cautious, no ataxia   IMPRESSION: This is a pleasant 87 yo RH woman with a history of hypertension, hyperlipidemia, diabetes, cervical cancer, trigeminal neuralgia, with mild dementia, likely due to Alzheimer's disease. MRI brain showed moderate cortical loss in the frontal and parietal lobes, mild chronic microvascular disease. MMSE today 25/30 (24/30 in 05/2020). She is overall stable, continue Donepezil 64m daily. We again  discussed the importance of control of vascular risk factors, physical exercise, and brain stimulation exercises for brain health. Continue to monitor driving. Continue close supervision. She reports burning at the tip of her tongue, continue to monitor, follow-up with PCP if symptoms  continue. Follow-up in 6-8 months, they know to call for any changes.    Thank you for allowing me to participate in her care.  Please do not hesitate to call for any questions or concerns.   Ellouise Newer, M.D.   CC: Dr. Deniece Ree

## 2021-01-01 NOTE — Patient Instructions (Signed)
Good to see you! Continue Donepezil 10mg  daily. Follow-up in 6-8 months, call for any changes  FALL PRECAUTIONS: Be cautious when walking. Scan the area for obstacles that may increase the risk of trips and falls. When getting up in the mornings, sit up at the edge of the bed for a few minutes before getting out of bed. Consider elevating the bed at the head end to avoid drop of blood pressure when getting up. Walk always in a well-lit room (use night lights in the walls). Avoid area rugs or power cords from appliances in the middle of the walkways. Use a walker or a cane if necessary and consider physical therapy for balance exercise. Get your eyesight checked regularly.  FINANCIAL OVERSIGHT: Supervision, especially oversight when making financial decisions or transactions is also recommended.  HOME SAFETY: Consider the safety of the kitchen when operating appliances like stoves, microwave oven, and blender. Consider having supervision and share cooking responsibilities until no longer able to participate in those. Accidents with firearms and other hazards in the house should be identified and addressed as well.  DRIVING: Regarding driving, in patients with progressive memory problems, driving will be impaired. We advise to have someone else do the driving if trouble finding directions or if minor accidents are reported. Independent driving assessment is available to determine safety of driving.  ABILITY TO BE LEFT ALONE: If patient is unable to contact 911 operator, consider using LifeLine, or when the need is there, arrange for someone to stay with patients. Smoking is a fire hazard, consider supervision or cessation. Risk of wandering should be assessed by caregiver and if detected at any point, supervision and safe proof recommendations should be instituted.  MEDICATION SUPERVISION: Inability to self-administer medication needs to be constantly addressed. Implement a mechanism to ensure safe  administration of the medications.  RECOMMENDATIONS FOR ALL PATIENTS WITH MEMORY PROBLEMS: 1. Continue to exercise (Recommend 30 minutes of walking everyday, or 3 hours every week) 2. Increase social interactions - continue going to [place] and enjoy social gatherings with friends and family 3. Eat healthy, avoid fried foods and eat more fruits and vegetables 4. Maintain adequate blood pressure, blood sugar, and blood cholesterol level. Reducing the risk of stroke and cardiovascular disease also helps promoting better memory. 5. Avoid stressful situations. Live a simple life and avoid aggravations. Organize your time and prepare for the next day in anticipation. 6. Sleep well, avoid any interruptions of sleep and avoid any distractions in the bedroom that may interfere with adequate sleep quality 7. Avoid sugar, avoid sweets as there is a strong link between excessive sugar intake, diabetes, and cognitive impairment We discussed the Mediterranean diet, which has been shown to help patients reduce the risk of progressive memory disorders and reduces cardiovascular risk. This includes eating fish, eat fruits and green leafy vegetables, nuts like almonds and hazelnuts, walnuts, and also use olive oil. Avoid fast foods and fried foods as much as possible. Avoid sweets and sugar as sugar use has been linked to worsening of memory function.  There is always a concern of gradual progression of memory problems. If this is the case, then we may need to adjust level of care according to patient needs. Support, both to the patient and caregiver, should then be put into place.

## 2021-01-10 ENCOUNTER — Ambulatory Visit: Payer: PPO | Admitting: Cardiology

## 2021-01-11 ENCOUNTER — Encounter: Payer: Self-pay | Admitting: Cardiology

## 2021-01-11 ENCOUNTER — Ambulatory Visit: Payer: PPO | Admitting: Cardiology

## 2021-01-11 ENCOUNTER — Other Ambulatory Visit: Payer: Self-pay

## 2021-01-11 VITALS — BP 120/70 | HR 57 | Ht 60.0 in | Wt 83.2 lb

## 2021-01-11 DIAGNOSIS — I10 Essential (primary) hypertension: Secondary | ICD-10-CM | POA: Diagnosis not present

## 2021-01-11 DIAGNOSIS — I340 Nonrheumatic mitral (valve) insufficiency: Secondary | ICD-10-CM

## 2021-01-11 DIAGNOSIS — I6523 Occlusion and stenosis of bilateral carotid arteries: Secondary | ICD-10-CM | POA: Diagnosis not present

## 2021-01-11 DIAGNOSIS — E785 Hyperlipidemia, unspecified: Secondary | ICD-10-CM

## 2021-01-11 DIAGNOSIS — Z720 Tobacco use: Secondary | ICD-10-CM

## 2021-01-11 DIAGNOSIS — I25119 Atherosclerotic heart disease of native coronary artery with unspecified angina pectoris: Secondary | ICD-10-CM | POA: Diagnosis not present

## 2021-01-11 NOTE — Progress Notes (Signed)
Cardiology Office Note:    Date:  01/11/2021   ID:  [name], DOB 1934-09-16, MRN [id number]  PCP:  Isaac Bliss, Rayford Halsted, MD  Cardiologist:  [name], MD    Referring MD: Isaac Bliss, [place]*   Chief Complaint  Patient presents with  . Coronary Artery Disease  . Hypertension  . Hyperlipidemia  . Mitral Regurgitation    History of Present Illness:    [name] is a 87 y.o. female with    Coronary artery disease ? Myoview 5/19 with anteroseptal and lateral ischemia ? CTA 10/19: Coronary calcium 662, moderate CAD in the LAD and LCx with borderline significance in the LCx by FFR>>medical therapy unless significant symptoms  Mitral regurgitation ? Moderate by TEE 11/2020  Carotid artery disease  Diabetes mellitus  Hypertension  Hyperlipidemia  Raynaud's disease  History of elevated LFTs with dilated common bile duct  Tobacco abuse  Trigeminal neuralgia  Medicationnon adherence  Dementia  Aortic Atherosclerosis by TEE 11/2020  Prior Cardiac Studies  TEE 11/2020 IMPRESSIONS   1. Left ventricular ejection fraction, by estimation, is 60 to 65%. The  left ventricle has normal function. The left ventricle has no regional  wall motion abnormalities.  2. Right ventricular systolic function is normal. The right ventricular  size is normal. Tricuspid regurgitation signal is inadequate for assessing  PA pressure.  3. Left atrial size was mildly dilated. No left atrial/left atrial  appendage thrombus was detected.  4. The mitral valve is normal in structure. Moderate mitral valve  regurgitation. No evidence of mitral stenosis.  5. The aortic valve is normal in structure. Aortic valve regurgitation is  not visualized. No aortic stenosis is present.  6. There is mild (Grade II) layered plaque involving the ascending aorta,  transverse aorta and descending aorta.   Conclusion(s)/Recommendation(s): Normal  biventricular function with  moderate central mitral regurgitation   Coronary CTA 09/21/2018 IMPRESSION: 1. Coronary calcium score of 662. This was 22 percentile for age and sex matched control.  2. Normal coronary origin with right dominance.  3. Moderate CAD in the proximal and mid LAD and proximal and mid LCX arteries. Additional analysis with CT FFR will be submitted.  4. Severe mitral annular calcifications. FFR 1. Left Main: No significant stenosis.  2. LAD: Proximal: 0.92, distal: 0.80. 3. D1: 0.81. 4. LCX: Proximal: 0.98, distal: 0.78. 5. RCA: Proximal: 0.98, distal: 0.85. 6. PDA: 0.81.  IMPRESSION: 1. CT FFR analysis showed stenosis in the mid portion of a non-dominant LCX artery. A medical management would be recommended unless there are significant symptoms.  Carotid US 07/31/2018 Bilateral ICA 1-39  Myoview 04/15/2018 Large size, moderate severity partially reversible anteroseptal and septal perfusion defect and a medium size, moderate severity partially reversible perfusion defect of the lateral wall. These findings are suggestive of ischemia, but artifact cannot be excluded. Clinical correlation is recommended and additional testing may be necessary.  Echocardiogram 03/25/2018 Mild concentric LVH, EF normal, normal wall motion, grade 1 diastolic dysfunction, MAC, moderate MR, mild LAE, PASP 32  History of Present Illness: The patient was seen by me in 09/2020 and was doing well with stable angina.  Her anginal symptoms were very well controlled on her antianginal regimen.  Followup 2D echo showed severe mitral regurgitation. She underwent TEE 11/2020 showing normal LVF and moderate MR with aortic atherosclerosis.  She is here today for followup and is doing well.  She denies any chest pain or pressure, SOB, DOE, PND, orthopnea, LE edema, dizziness, palpitations  or syncope. She is compliant with her meds and is tolerating meds with no SE.    Past Medical  History:  Diagnosis Date  . At risk for medication noncompliance   . CAD (coronary artery disease)    a. borderline disease by CT 08/2018.  . Diabetes mellitus, type 2 ([place])   . History of cervical cancer   . Hyperlipidemia   . Hypertension   . Mitral regurgitation    severe MR by echo 10/2020 // TEE 1/22: EF 60-65, normal RVSF, mild LAE, mod MR  . Noncompliance with medication treatment due to underuse of medication   . Raynaud's phenomenon   . TN (trigeminal neuralgia)     Past Surgical History:  Procedure Laterality Date  . BREAST BIOPSY    . BREAST ENHANCEMENT SURGERY    . CHOLECYSTECTOMY N/A 02/14/2014   Procedure: LAPAROSCOPIC CHOLECYSTECTOMY;  Surgeon: Ralene Ok, MD;  Location: [facility];  Service: General;  Laterality: N/A;  . ERCP N/A 03/08/2015   Procedure: ENDOSCOPIC RETROGRADE CHOLANGIOPANCREATOGRAPHY (ERCP);  Surgeon: Ladene Artist, MD;  Location: [facility];  Service: Endoscopy;  Laterality: N/A;  . MASS EXCISION Right 12/25/2016   Procedure: EXCISION RIGHT SCALP MASS SEBACEOUS CYST;  Surgeon: Coralie Keens, MD;  Location: [facility];  Service: General;  Laterality: Right;  . TEE WITHOUT CARDIOVERSION N/A 12/12/2020   Procedure: TRANSESOPHAGEAL ECHOCARDIOGRAM (TEE);  Surgeon: Sueanne Margarita, MD;  [facility];  Service: Cardiovascular;  Laterality: N/A;  . [place]   for cervical cancer in situ    Current Medications: Current Meds  Medication Sig  . amLODipine (NORVASC) 5 MG tablet TAKE ONE TABLET BY MOUTH ONCE DAILY (AM)  . aspirin 81 MG EC tablet Take 1 tablet (81 mg total) by mouth daily. Swallow whole.  [name] atorvastatin (LIPITOR) 80 MG tablet TAKE ONE TABLET BY MOUTH ONCE DAILY (BEDTIME)  . Blood Glucose Monitoring Suppl (ONE TOUCH ULTRA MINI) w/Device KIT USE TO CHECK BLOOD SUGAR DAILY AND AS NEEDED  . cholecalciferol (VITAMIN D3) 25 MCG (1000 UNIT) tablet Take 1,000 Units by mouth in the morning and at bedtime.  .  donepezil (ARICEPT) 10 MG tablet Take 1 tablet (10 mg total) by mouth at bedtime.  [name] glucose blood (ACCU-CHEK GUIDE) test strip Use as instructed  . isosorbide mononitrate (IMDUR) 120 MG 24 hr tablet Take 1 tablet (120 mg total) by mouth daily.  . Lancets (ONETOUCH ULTRASOFT) lancets Once daily or PRN, Dx E11.9  . lisinopril (ZESTRIL) 20 MG tablet TAKE ONE TABLET BY MOUTH ONCE DAILY (AM)  . metFORMIN (GLUCOPHAGE) 1000 MG tablet TAKE ONE TABLET BY MOUTH TWICE DAILY WITH A MEAL (AM AND BEDTIME)  . Multiple Vitamins-Minerals (ONE DAILY PLUS MINERALS) TABS TAKE 1 CAPSULE BY MOUTH DAILY (AM)  . nitroGLYCERIN (NITROSTAT) 0.4 MG SL tablet PLACE 1 TABLET (0.4 MG TOTAL) UNDER THE TONGUE EVERY 5 (FIVE) MINUTES AS NEEDED FOR CHEST PAIN.  [name] oxybutynin (DITROPAN) 5 MG tablet Take 5 mg by mouth 2 (two) times daily.     Allergies:   Shrimp (diagnostic) and Morphine and related   Social History   Socioeconomic History  . Marital status: Widowed    Spouse name: [name]  . Number of children: 1  . Years of education: Not on file  . Highest education level: Not on file  Occupational History  . 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 Smoking status: Current Every Day Smoker  Packs/day: 1.50    Years: 60.00    Pack years: 90.00    Types: Cigarettes  . Smokeless tobacco: Never Used  Vaping Use  . Vaping Use: Never used  Substance and Sexual Activity  . Alcohol use: No  . Drug use: No  . Sexual activity: Not on file  Other Topics Concern  . Not on file  Social History Narrative   Married.  Lives in [place] with her husband.  No FH of heart disease.   1 adult son   + smoker   No EtOH   Right handed    Social Determinants of Health   Financial Resource Strain: Not on file  Food Insecurity: Not on file  Transportation Needs: Not on file  Physical Activity: Not on file  Stress: Not on file  Social Connections: Not on file     Family History: The patient's  family history is negative for CAD and Diabetes Mellitus II.  ROS:   Please see the history of present illness.    ROS  All other systems reviewed and negative.   EKGs/Labs/Other Studies Reviewed:    The following studies were reviewed today:    EKG:  EKG is not ordered today.    Recent Labs: 04/25/2020: ALT 14; Hemoglobin 15.4; Platelets 241.0; TSH 1.94 12/08/2020: BUN 21; Creatinine, Ser 1.10; Potassium 4.4; Sodium 146   Recent Lipid Panel    Component Value Date/Time   CHOL 158 04/25/2020 0841   CHOL 128 07/19/2019 0833   TRIG 139.0 04/25/2020 0841   HDL 57.60 04/25/2020 0841   HDL 58 07/19/2019 0833   CHOLHDL 3 04/25/2020 0841   VLDL 27.8 04/25/2020 0841   LDLCALC 73 04/25/2020 0841   LDLCALC 40 07/19/2019 0833   LDLDIRECT 83.0 12/21/2014 1040    Physical Exam:    VS:  BP 120/70   Pulse (!) 57   Ht 5' (1.524 m)   Wt 83 lb 3.2 oz (37.7 kg)   SpO2 99%   BMI 16.25 kg/m     Wt Readings from Last 3 Encounters:  01/11/21 83 lb 3.2 oz (37.7 kg)  01/01/21 82 lb 9.6 oz (37.5 kg)  12/12/20 85 lb (38.6 kg)     GEN: Well nourished, well developed in no acute distress HEENT: Normal NECK: No JVD; No carotid bruits LYMPHATICS: No lymphadenopathy CARDIAC:RRR, no murmurs, rubs, gallops RESPIRATORY:  Clear to auscultation without rales, wheezing or rhonchi  ABDOMEN: Soft, non-tender, non-distended MUSCULOSKELETAL:  No edema; No deformity  SKIN: Warm and dry NEUROLOGIC:  Alert and oriented x 3 PSYCHIATRIC:  Normal affect    ASSESSMENT:    1. Coronary artery disease involving native coronary artery of native heart with angina pectoris ([place])   2. Essential hypertension   3. Dyslipidemia   4. Tobacco abuse   5. Bilateral carotid artery stenosis   6. Moderate mitral regurgitation    PLAN:    In order of problems listed above:  1. Coronary artery disease involving native coronary artery of native heart with angina pectoris (HCC) -Moderate CAD by coronary CTA in  October 2019 with borderline significant stenosis in the mid LCx.   -Medical therapy was recommended unless she has severe symptoms due to her significant dementia.   -she has chronic stable angina that is very stable on chronic long acting nitrates and amlodipne -she has not had any further CP since I saw her last -continue amlodipine 58m daily, ASA, Imdur 1270mdaily and statin   2. Essential hypertension -  BP is well controlled one exam today -continue amlodipine 47m daily, Lisinopril 277mdaily and Imdur 12074maily -SCr was stable at 1.1 and K+ 4.4 in Jan 2022  3. Hyperlipidemia, unspecified hyperlipidemia type -LDL goal < 70 -LDL was 73 in June 2021 -continue atorvastatin 45m54mily  4. Tobacco abuse -again recommended tobacco cessation  5.  Carotid artery stenosis -1-39% bilateral by dopplers 07/2018 -continue ASA and statin  6.  Mitral regurgitation -2D echo 03/2018 with moderate MR -repeat 2D echo 10/2020 showed possibly severe MR -TEE 11/2020 showed stable moderate MR -repeat echo in 1 year  Followup with me in 1 year  Medication Adjustments/Labs and Tests Ordered: Current medicines are reviewed at length with the patient today.  Concerns regarding medicines are outlined above.  No orders of the defined types were placed in this encounter.  No orders of the defined types were placed in this encounter.   Signed, TracFransico Him  01/11/2021 11:39 AM    [facility]

## 2021-01-11 NOTE — Addendum Note (Signed)
Addended by: Antonieta Iba on: 01/11/2021 11:44 AM   Modules accepted: Orders

## 2021-01-11 NOTE — Patient Instructions (Signed)

## 2021-02-02 ENCOUNTER — Other Ambulatory Visit: Payer: Self-pay | Admitting: Internal Medicine

## 2021-02-02 DIAGNOSIS — E119 Type 2 diabetes mellitus without complications: Secondary | ICD-10-CM

## 2021-04-10 ENCOUNTER — Other Ambulatory Visit: Payer: Self-pay | Admitting: Internal Medicine

## 2021-04-10 DIAGNOSIS — E119 Type 2 diabetes mellitus without complications: Secondary | ICD-10-CM

## 2021-04-27 ENCOUNTER — Telehealth: Payer: Self-pay | Admitting: Internal Medicine

## 2021-04-27 NOTE — Chronic Care Management (AMB) (Signed)
  Chronic Care Management   Note  04/27/2021 Name: [name] MRN: [id number] DOB: 06-16-1934  [name] is a 87 y.o. year old female who is a primary care patient of Isaac Bliss, Rayford Halsted, MD. I reached out to [name] by phone today in response to a referral sent by [name] PCP, Isaac Bliss, Rayford Halsted, MD.   [name] was given information about Chronic Care Management services today including:  1. CCM service includes personalized support from designated clinical staff supervised by her physician, including individualized plan of care and coordination with other care providers 2. 24/7 contact phone numbers for assistance for urgent and routine care needs. 3. Service will only be billed when office clinical staff spend 20 minutes or more in a month to coordinate care. 4. Only one practitioner may furnish and bill the service in a calendar month. 5. The patient may stop CCM services at any time (effective at the end of the month) by phone call to the office staff.   [name] ROBERTSON verbally agreed to assistance and services provided by embedded care coordination/care management team today.  Follow up plan:   Tatjana Secretary/administrator

## 2021-05-07 ENCOUNTER — Other Ambulatory Visit: Payer: Self-pay | Admitting: Cardiology

## 2021-05-07 ENCOUNTER — Other Ambulatory Visit: Payer: Self-pay | Admitting: Internal Medicine

## 2021-05-07 DIAGNOSIS — E119 Type 2 diabetes mellitus without complications: Secondary | ICD-10-CM

## 2021-05-08 NOTE — Telephone Encounter (Signed)
Rx were refilled on 04/10/2021, too soon for refill

## 2021-06-15 ENCOUNTER — Telehealth: Payer: Self-pay | Admitting: Pharmacist

## 2021-06-15 NOTE — Chronic Care Management (AMB) (Signed)
Chronic Care Management Pharmacy Assistant   Name: [name]  MRN: [id number] DOB: [birth year]  Reason for Encounter: Chart Prep for CPP visit on 07.27.2022   Conditions to be addressed/monitored: HTN and DMII  Primary concerns for visit include: Medication Management   Recent office visits:  08.17.2022 Isaac Bliss, Rayford Halsted, MD patient seen for follow-up on Dyslipidemia, diabetes mellitus type 2, hypertension, and loss of appetite   Recent consult visits:  02.17.2022 Sueanne Margarita, MD Cardiology patient seen for follow-up on coronary artery disease 02.07.2022 Cameron Sprang, MD Neurology patient seen for follow-up for mild dementia  Hospital visits:  None in previous 6 months  Medications: Outpatient Encounter Medications as of 06/15/2021  Medication Sig   amLODipine (NORVASC) 5 MG tablet TAKE ONE TABLET BY MOUTH ONCE DAILY (AM)   aspirin 81 MG EC tablet Take 1 tablet (81 mg total) by mouth daily. Swallow whole.   atorvastatin (LIPITOR) 80 MG tablet TAKE ONE TABLET BY MOUTH ONCE DAILY (BEDTIME)   Blood Glucose Monitoring Suppl (ONE TOUCH ULTRA MINI) w/Device KIT USE TO CHECK BLOOD SUGAR DAILY AND AS NEEDED   cholecalciferol (VITAMIN D3) 25 MCG (1000 UNIT) tablet Take 1,000 Units by mouth in the morning and at bedtime.   COVID-19 mRNA vaccine, Pfizer, 30 MCG/0.3ML injection AS DIRECTED   donepezil (ARICEPT) 10 MG tablet Take 1 tablet (10 mg total) by mouth at bedtime.   glucose blood (ACCU-CHEK GUIDE) test strip Use as instructed   influenza vaccine adjuvanted (FLUAD) 0.5 ML injection TAKE AS DIRECTED   isosorbide mononitrate (IMDUR) 120 MG 24 hr tablet TAKE 1 TABLET (120 MG TOTAL) BY MOUTH DAILY.   Lancets (ONETOUCH ULTRASOFT) lancets Once daily or PRN, Dx E11.9   lisinopril (ZESTRIL) 20 MG tablet TAKE ONE TABLET BY MOUTH ONCE DAILY (AM)   metFORMIN (GLUCOPHAGE) 1000 MG tablet TAKE ONE TABLET BY MOUTH TWICE DAILY WITH A MEAL (AM AND BEDTIME)   Multiple  Vitamins-Minerals (ONE-DAILY MULTI-VIT/MINERAL) TABS TAKE 1 CAPSULE BY MOUTH DAILY (AM)   nitroGLYCERIN (NITROSTAT) 0.4 MG SL tablet PLACE 1 TABLET (0.4 MG TOTAL) UNDER THE TONGUE EVERY 5 (FIVE) MINUTES AS NEEDED FOR CHEST PAIN.   omeprazole (PRILOSEC) 20 MG capsule TAKE 1 CAPSULE BY MOUTH 30 MINUTES BEFORE MORNING MEAL   oxybutynin (DITROPAN) 5 MG tablet TAKE ONE TABLET BY MOUTH TWICE DAILY   No facility-administered encounter medications on file as of 06/15/2021.   Patient Questions:   Have you seen any other providers since your last visit?  Sueanne Margarita, MD Cardiology Any changes in your medications or health? No Any side effects from any medications? No Do you have an symptoms or problems not managed by your medications? No Any concerns about your health right now? No Has your provider asked that you check blood pressure, blood sugar, or follow special diet at home? No Do you get any type of exercise on a regular basis?  She likes to work in her garden three to five days a week Can you think of a goal you would like to reach for your health?  Smoking (son will like for her to stop) Increase appetite Do you have any problems getting your medications? No Is there anything that you would like to discuss during the appointment?  Reduce medication  Care Gaps: Zoster Vaccines Dexa Scan Eye exam Foot Exam Hgb A1C Covid-19 4th booster  Star Rating Drugs: Medications Dispensed Quantity Pharmacy  Atorvastatin 80 mg 06.13.2022 [facility]  Maia Breslow, [place] Pharmacist Assistant [phone]

## 2021-06-20 ENCOUNTER — Ambulatory Visit (INDEPENDENT_AMBULATORY_CARE_PROVIDER_SITE_OTHER): Payer: PPO | Admitting: Pharmacist

## 2021-06-20 ENCOUNTER — Other Ambulatory Visit: Payer: Self-pay | Admitting: Internal Medicine

## 2021-06-20 DIAGNOSIS — E119 Type 2 diabetes mellitus without complications: Secondary | ICD-10-CM

## 2021-06-20 DIAGNOSIS — E785 Hyperlipidemia, unspecified: Secondary | ICD-10-CM

## 2021-06-20 DIAGNOSIS — I10 Essential (primary) hypertension: Secondary | ICD-10-CM

## 2021-06-20 NOTE — Progress Notes (Signed)
Chronic Care Management Pharmacy Note  06/24/2021 Name:  [name] MRN:  [id number] DOB:  1934-04-02  Summary: BP not at goal < 140/90 per office readings and does not check at home Pt is struggling with compliance with current medication regimen  Recommendations/Changes made from today's visit: -Recommended moving administration of medication times to with food (10 am and 7pm) to improve compliance -Recommended weekly monitoring of BP at home -Recommended decreasing omeprazole to every other day for one month and then stopping -Recommended DEXA scan -Recommended switching oxybutynin to XL for once daily administration  Plan: BP assessment in 2 months Follow up in 6 months   Subjective: [name] is an 87 y.o. year old female who is a primary patient of Isaac Bliss, Rayford Halsted, MD.  The CCM team was consulted for assistance with disease management and care coordination needs.    Engaged with patient by telephone for follow up visit in response to provider referral for pharmacy case management and/or care coordination services.   Consent to Services:  The patient was given the following information about Chronic Care Management services today, agreed to services, and gave verbal consent: 1. CCM service includes personalized support from designated clinical staff supervised by the primary care provider, including individualized plan of care and coordination with other care providers 2. 24/7 contact phone numbers for assistance for urgent and routine care needs. 3. Service will only be billed when office clinical staff spend 20 minutes or more in a month to coordinate care. 4. Only one practitioner may furnish and bill the service in a calendar month. 5.The patient may stop CCM services at any time (effective at the end of the month) by phone call to the office staff. 6. The patient will be responsible for cost sharing (co-pay) of up to 20% of the service  fee (after annual deductible is met). Patient agreed to services and consent obtained.  Patient Care Team: Isaac Bliss, Rayford Halsted, MD as PCP - General (Internal Medicine) Sueanne Margarita, MD as PCP - Cardiology (Cardiology) Cameron Sprang, MD as Consulting Physician (Neurology) Viona Gilmore, [facility] as Pharmacist (Pharmacist)  Recent office visits: 07/11/20 Isaac Bliss, Rayford Halsted, MD: patient seen for follow-up on chronic conditions.  Recent consult visits: 01/11/21 Sueanne Margarita, MD (Cardiology): patient seen for follow-up on coronary artery disease.  01/01/21 Cameron Sprang, MD (Neurology): patient seen for follow-up for mild dementia.  Hospital visits: None in previous 6 months   Objective:  Lab Results  Component Value Date   CREATININE 1.10 (H) 12/08/2020   BUN 21 12/08/2020   GFR 63.68 04/25/2020   GFRNONAA 46 (L) 12/08/2020   GFRAA 53 (L) 12/08/2020   NA 146 (H) 12/08/2020   K 4.4 12/08/2020   CALCIUM 9.6 12/08/2020   CO2 22 12/08/2020   GLUCOSE 164 (H) 12/08/2020    Lab Results  Component Value Date/Time   HGBA1C 6.2 (A) 04/25/2020 08:08 AM   HGBA1C 8.5 (A) 09/22/2019 11:43 AM   HGBA1C 7.7 (H) 02/04/2017 02:39 PM   HGBA1C 7.8 (H) 11/12/2016 04:00 PM   GFR 63.68 04/25/2020 08:41 AM   GFR 65.33 05/18/2018 01:28 PM   MICROALBUR 1.1 03/18/2016 04:56 PM   MICROALBUR <0.7 12/21/2014 10:40 AM    Last diabetic Eye exam:  Lab Results  Component Value Date/Time   HMDIABEYEEXA normal 07/16/2010 12:00 AM    Last diabetic Foot exam:  Lab Results  Component Value Date/Time   HMDIABFOOTEX done 10/25/2011 12:00 AM  Lab Results  Component Value Date   CHOL 158 04/25/2020   HDL 57.60 04/25/2020   LDLCALC 73 04/25/2020   LDLDIRECT 83.0 12/21/2014   TRIG 139.0 04/25/2020   CHOLHDL 3 04/25/2020    Hepatic Function Latest Ref Rng & Units 04/25/2020 07/19/2019 04/16/2019  Total Protein 6.0 - 8.3 g/dL 6.6 6.0 6.0  Albumin 3.5 - 5.2 g/dL 4.3 4.0 4.1  AST  0 - 37 U/L '21 16 19  ' ALT 0 - 35 U/L '14 11 15  ' Alk Phosphatase 39 - 117 U/L 74 78 82  Total Bilirubin 0.2 - 1.2 mg/dL 0.6 0.5 0.5  Bilirubin, Direct 0.00 - 0.40 mg/dL - 0.16 0.20    Lab Results  Component Value Date/Time   TSH 1.94 04/25/2020 08:41 AM   TSH 0.78 09/22/2019 11:57 AM    CBC Latest Ref Rng & Units 04/25/2020 09/02/2019 10/01/2018  WBC 4.0 - 10.5 K/uL 7.9 10.6 11.3(H)  Hemoglobin 12.0 - 15.0 g/dL 15.4(H) 16.1(H) 15.9  Hematocrit 36.0 - 46.0 % 45.8 46.0 46.2  Platelets 150.0 - 400.0 K/uL 241.0 273 258    Lab Results  Component Value Date/Time   VD25OH 45.03 04/25/2020 08:29 AM   VD25OH 40.91 09/22/2019 11:57 AM    Clinical ASCVD: Yes  The ASCVD Risk score Mikey Bussing DC Jr., et al., 2013) failed to calculate for the following reasons:   The 2013 ASCVD risk score is only valid for ages 57 to 76    Depression screen PHQ 2/9 04/25/2020 10/21/2018 10/05/2018  Decreased Interest 0 0 0  Down, Depressed, Hopeless 1 0 0  PHQ - 2 Score 1 0 0  Altered sleeping 0 - -  Tired, decreased energy 2 - -  Change in appetite 2 - -  Feeling bad or failure about yourself  0 - -  Trouble concentrating 0 - -  Moving slowly or fidgety/restless 0 - -  Suicidal thoughts 0 - -  PHQ-9 Score 5 - -  Difficult doing work/chores Not difficult at all - -  Some recent data might be hidden     Social History   Tobacco Use  Smoking Status Every Day   Packs/day: 1.50   Years: 60.00   Pack years: 90.00   Types: Cigarettes  Smokeless Tobacco Never   BP Readings from Last 3 Encounters:  01/11/21 120/70  01/01/21 (!) 147/66  12/12/20 (!) 147/50   Pulse Readings from Last 3 Encounters:  01/11/21 (!) 57  01/01/21 100  12/12/20 65   Wt Readings from Last 3 Encounters:  01/11/21 83 lb 3.2 oz (37.7 kg)  01/01/21 82 lb 9.6 oz (37.5 kg)  12/12/20 85 lb (38.6 kg)   BMI Readings from Last 3 Encounters:  01/11/21 16.25 kg/m  01/01/21 17.26 kg/m  12/12/20 17.77 kg/m     Assessment/Interventions: Review of patient past medical history, allergies, medications, health status, including review of consultants reports, laboratory and other test data, was performed as part of comprehensive evaluation and provision of chronic care management services.   SDOH:  (Social Determinants of Health) assessments and interventions performed: Yes SDOH Interventions    Flowsheet Row Most Recent Value  SDOH Interventions   Financial Strain Interventions Intervention Not Indicated  Transportation Interventions Intervention Not Indicated      SDOH Screenings   Alcohol Screen: Not on file  Depression (XNA3-5): Not on file  Financial Resource Strain: Low Risk    Difficulty of Paying Living Expenses: Not hard at all  Food Insecurity: Not on  file  Housing: Not on file  Physical Activity: Not on file  Social Connections: Not on file  Stress: Not on file  Tobacco Use: High Risk   Smoking Tobacco Use: Every Day   Smokeless Tobacco Use: Never  Transportation Needs: No Transportation Needs   Lack of Transportation (Medical): No   Lack of Transportation (Non-Medical): No   Patient lives alone but her son and family friend stop in to check on her and help her with medication management. Her son is in communication with her frequently, despite not living there and he is on the call for the visit. She enjoys working in her garden 3-5 days a week and that is her form of exercise.  Patient is concerned about her decreased appetite and doesn't always take her medications as scheduled as she feels some of them aren't working. She does not know what all of them are for but feels like it's too many.   She continues to smoke knowing the effects on her health and smokes about 15 cigarettes per day, which she has cut back on significantly over the last few years.   Patient is using [facility] and they prepackage her medications. Her son or friend will remove it from the packaging  and put it in a pill reminder which has an alarm that goes on at 6am and 6pm every day. Patient shuts off the alarm but doesn't always take what is inside the machine. She worries about not taking the medications on an empty stomach as she doesn't always eat first thing in the morning.  CCM Care Plan  Allergies  Allergen Reactions   Shrimp (Diagnostic) Itching   Morphine And Related Itching    Short lived, mild itching.    Medications Reviewed Today     Reviewed by Viona Gilmore, [facility] (Pharmacist) on 06/22/21 at [place] List Status: <None>   Medication Order Taking? Sig Documenting Provider Last Dose Status Informant  amLODipine (NORVASC) 5 MG tablet [id number] Yes TAKE ONE TABLET BY MOUTH ONCE DAILY (AM) Richardson Dopp T, PA-C Taking Active Family Member  aspirin 81 MG EC tablet [id number] Yes Take 1 tablet (81 mg total) by mouth daily. Swallow whole. Richardson Dopp T, PA-C Taking Active Family Member  atorvastatin (LIPITOR) 80 MG tablet [id number] Yes TAKE ONE TABLET BY MOUTH ONCE DAILY (BEDTIME) Liliane Shi, PA-C Taking Active Family Member  Blood Glucose Monitoring Suppl (ONE TOUCH ULTRA MINI) w/Device KIT [id number]  USE TO CHECK BLOOD SUGAR DAILY AND AS NEEDED Marletta Lor, MD  Active Family Member  cholecalciferol (VITAMIN D3) 25 MCG (1000 UNIT) tablet [id number] Yes Take 1,000 Units by mouth in the morning and at bedtime. [provider] Taking Active Family Member  donepezil (ARICEPT) 10 MG tablet [id number] Yes Take 1 tablet (10 mg total) by mouth at bedtime. Cameron Sprang, MD Taking Active   glucose blood (ACCU-CHEK GUIDE) test strip [id number]  Use as instructed Marletta Lor, MD  Active Family Member  isosorbide mononitrate (IMDUR) 120 MG 24 hr tablet [id number] Yes TAKE 1 TABLET (120 MG TOTAL) BY MOUTH DAILY. Sueanne Margarita, MD Taking Active   Lancets [facility] ULTRASOFT) lancets [id number]  Once daily or PRN, Dx E11.9 Marletta Lor, MD  Active  Family Member  lisinopril (ZESTRIL) 20 MG tablet [id number] Yes TAKE ONE TABLET BY MOUTH ONCE DAILY (AM) Richardson Dopp T, PA-C Taking Active Family Member  metFORMIN (GLUCOPHAGE) 1000 MG tablet [id number] Yes TAKE ONE TABLET BY  MOUTH TWICE DAILY WITH A MEAL (AM AND BEDTIME) Isaac Bliss, Rayford Halsted, MD Taking Active   mirtazapine (REMERON) 15 MG tablet [id number] Yes Take 15 mg by mouth at bedtime. [provider] Taking Active   Multiple Vitamins-Minerals (ONE-DAILY MULTI-VIT/MINERAL) Sheral Flow [id number] Yes TAKE 1 TABLET BY MOUTH DAILY (AM) Isaac Bliss, Rayford Halsted, MD Taking Active   nitroGLYCERIN (NITROSTAT) 0.4 MG SL tablet [id number] No PLACE 1 TABLET (0.4 MG TOTAL) UNDER THE TONGUE EVERY 5 (FIVE) MINUTES AS NEEDED FOR CHEST PAIN.  Patient not taking: Reported on 06/22/2021   Richardson Dopp T, PA-C Not Taking Active Family Member  omeprazole (PRILOSEC) 20 MG capsule [id number] Yes TAKE 1 CAPSULE BY MOUTH 30 MINUTES BEFORE MORNING MEAL Isaac Bliss, Rayford Halsted, MD Taking Active   oxybutynin (DITROPAN) 5 MG tablet [id number] Yes TAKE ONE TABLET BY MOUTH TWICE DAILY Isaac Bliss, Rayford Halsted, MD Taking Active             Patient Active Problem List   Diagnosis Date Noted   Severe mitral regurgitation    Moderate mitral regurgitation 04/28/2018   Diabetes mellitus type 2 in nonobese ([place]) 03/25/2018   Dilated cbd, acquired 03/08/2015   Acute biliary pancreatitis 03/06/2015   Atypical chest pain 10/06/2013   Carotid artery disease ([place]) 12/13/2012   Tobacco abuse 02/20/2012   RAYNAUD'S SYNDROME 11/30/2009   TRIGEMINAL NEURALGIA 04/14/2008   Dyslipidemia 07/23/2007   Essential hypertension 07/23/2007   CERVICAL CANCER, HX OF 07/23/2007    Immunization History  Administered Date(s) Administered   Influenza Split 10/25/2011, 07/27/2013   Influenza Whole 10/11/2009, 10/08/2010   Influenza, High Dose Seasonal PF 11/17/2015, 08/15/2016, 08/26/2017, 10/21/2018   Influenza,inj,Quad  PF,6+ Mos 10/09/2014   Influenza-Unspecified 12/08/2019   PFIZER(Purple Top)SARS-COV-2 Vaccination 11/26/2019, 12/27/2019, 09/19/2020   Pneumococcal Conjugate-13 12/21/2014   Pneumococcal Polysaccharide-23 04/02/2012, 11/13/2018    Conditions to be addressed/monitored:  Hypertension, Hyperlipidemia, Diabetes, Coronary Artery Disease, GERD, Overactive Bladder, and Memory loss, Appetite loss  Care Plan : [place]  Updates made by Viona Gilmore, [place] since 06/24/2021 12:00 AM     Problem: Problem: Hypertension, Hyperlipidemia, Diabetes, Coronary Artery Disease, GERD, Overactive Bladder, and Memory loss, Appetite loss      Long-Range Goal: Patient-Specific Goal   Start Date: 06/20/2021  Expected End Date: 06/20/2022  This Visit's Progress: On track  Priority: High  Note:   Current Barriers:  Unable to independently monitor therapeutic efficacy Does not adhere to prescribed medication regimen Does not maintain contact with provider office  Pharmacist Clinical Goal(s):  Patient will achieve adherence to monitoring guidelines and medication adherence to achieve therapeutic efficacy maintain control of blood pressure as evidenced by home blood pressure readings  achieve ability to self administer medications as prescribed through use of pill packing and alarms as evidenced by patient report through collaboration with PharmD and provider.   Interventions: 1:1 collaboration with Isaac Bliss, Rayford Halsted, MD regarding development and update of comprehensive plan of care as evidenced by provider attestation and co-signature Inter-disciplinary care team collaboration (see longitudinal plan of care) Comprehensive medication review performed; medication list updated in electronic medical record  Hypertension  (Status:Goal on track: NO.)   Med Management Intervention:  Follow up with PCP scheduled  (BP goal <140/90) -Uncontrolled -Current treatment: Lisinopril 20 mg 1  tablet daily Amlodipine 5 mg 1 tablet daily -Medications previously tried: none  -Current home readings: does not check regularly -Current dietary habits: eating a lot of vegetables and rice -Current exercise habits:  working on her garden -Denies hypotensive/hypertensive symptoms -Educated on BP goals and benefits of medications for prevention of heart attack, stroke and kidney damage; Exercise goal of 150 minutes per week; Importance of home blood pressure monitoring; Proper BP monitoring technique; -Counseled to monitor BP at home at least weekly, document, and provide log at future appointments -Counseled on diet and exercise extensively Recommended to continue current medication Recommended starting home blood pressure monitoring  Hyperlipidemia: (LDL goal < 70) -Not ideally controlled -Current treatment: Atorvastatin 80 mg 1 tablet daily -Medications previously tried: none  -Current dietary patterns: rice and vegetables -Current exercise habits: working in garden -Educated on Cholesterol goals;  Benefits of statin for ASCVD risk reduction; Importance of limiting foods high in cholesterol; Exercise goal of 150 minutes per week; -Counseled on diet and exercise extensively Recommended to continue current medication Recommended repeat lipid panel and consideration of addition of other lipid lowering agent.  CAD (Goal: prevent heart events) -Controlled -Current treatment  Aspirin 81 mg 1 tablet daily Atorvastatin 80 mg 1 tablet daily Nitroglycerin 0.4 mg SL tablets as needed Isosorbide mononitrate 120 mg 1 tablet daily -Medications previously tried: none  -Recommended to continue current medication Counseled on avoidance of NSAIDs with current aspirin use  Diabetes (A1c goal <7%) -Controlled -Current medications: Metformin 1000 mg 1 tablet twice daily with meals  -Medications previously tried: none  -Current home glucose readings fasting glucose: not checking post  prandial glucose: not checking -Denies hypoglycemic/hyperglycemic symptoms -Current meal patterns:  breakfast: cereal with milk or skipped lunch: rice and vegetables and meat  dinner: same as lunch snacks: no snacks drinks: orange juice (powdered without much sweetener); little water -Current exercise: working in the yard and sometimes goes to the gym -Educated on A1c and blood sugar goals; Benefits of routine self-monitoring of blood sugar; Carbohydrate counting and/or plate method -Counseled to check feet daily and get yearly eye exams -Counseled on diet and exercise extensively Recommended to continue current medication  GERD (Goal: minimize symptoms) -Controlled -Current treatment  Omeprazole 20 mg 1 capsule 30 minutes prior to eating -Medications previously tried: none  -Counseled on long term risks of taking PPIs and patient reported no symptoms. Recommended tapering to 1 capsule every other day. Educated on use of Tums for as needed heartburn  Overactive bladder (Goal: minimize symptoms) -Controlled -Current treatment  Oxybutynin 5 mg 1 tablet twice daily -Medications previously tried: none  -Recommended switching to XL to simplify regimen to once daily.  Tobacco use (Goal quit smoking) -Uncontrolled -Previous quit attempts: gum; patches -Current treatment  No medications -Patient smokes Within 30 minutes of waking -Patient triggers include:  enjoyment -On a scale of 1-10, reports MOTIVATION to quit is 2 -On a scale of 1-10, reports CONFIDENCE in quitting is 2 -Provided contact information for [place] Quit Line (1-800-QUIT-NOW) and encouraged patient to reach out to this group for support. -Counseled on benefits of smoking cessation such as increased appetite. Recommended DEXA scan due to smoking history.  Decreased appetite (Goal: increase appetite) -Not ideally controlled -Current treatment  Mirtazapine 15 mg 1 tablet at bedtime -Medications previously tried: none   -Counseled on benefits of taking consistently. Plan to continue with current dose and could consider escalation if not effective with taking consistently. Patient's son will add to her pill dispenser.  Health Maintenance -Vaccine gaps: COVID booster ([place]), shingrix -Current therapy:  Vitamin D 1000 units twice daily - 1000 units daily Multivitamin daily -Educated on Cost vs benefit of each product must be  carefully weighed by individual consumer -Patient is satisfied with current therapy and denies issues -Counseled on recommended amounts of calcium (1200 mg total) and vitamin D (1000 units) per day to support bone health. Recommended supplementation with calcium citrate 600 mg per day.  Patient Goals/Self-Care Activities Patient will:  - take medications as prescribed focus on medication adherence by use of pill tower and packaging check blood pressure weekly, document, and provide at future appointments target a minimum of 150 minutes of moderate intensity exercise weekly  Follow Up Plan: Telephone follow up appointment with care management team member scheduled for: 6 months      Medication Assistance: None required.  Patient affirms current coverage meets needs.  Compliance/Adherence/Medication fill history: Care Gaps: Shingrix, eye exam, foot exam, A1c, DEXA, COVID booster  [facility]: Atorvastatin 80 mg - last filled 05/07/21 for 30 ds at [facility]  Patient's preferred pharmacy is:  [facility], [address] [place] [address] Phone: [phone] Fax: [phone]  [facility], [address] [place] [address] Phone: [phone] Fax: [phone]  Uses pill box? Yes - uses pill tower and pill packaging Pt endorses  80% compliance  We discussed: Current pharmacy is preferred with insurance plan and patient is satisfied  with pharmacy services Patient decided to: Continue current medication management strategy  Care Plan and Follow Up Patient Decision:  Patient agrees to Care Plan and Follow-up.  Plan: Telephone follow up appointment with care management team member scheduled for:  6 months  Jeni Salles, PharmD, [place] Pharmacist [place] [facility] [phone]

## 2021-06-24 NOTE — Patient Instructions (Addendum)
Hi Verdis Frederickson and [name],  It was great to get to meet you over the telephone! Below is a summary of some of the topics we discussed.   Don't forget to move your medication administration times to 10 am and 7 pm as we discussed. Also, please try to find a calcium supplement with 600 mg calcium citrate to take daily to support your bone health.  Please reach out to me if you have any questions or need anything before our follow up!  Best, Maddie  Jeni Salles, PharmD, [facility] [place]   Visit Information   Goals Addressed             This Visit's Progress    Manage My Medicine       Timeframe:  Short-Term Goal Priority:  High Start Date:                             Expected End Date:                       Follow Up Date 09/22/21    - keep a list of all the medicines I take; vitamins and herbals too - use an alarm clock or phone to remind me to take my medicine  -switch times of taking medications to improve adherence and fit your schedule   Why is this important?   These steps will help you keep on track with your medicines.   Notes:      Track and Manage My Blood Pressure-Hypertension       Timeframe:  Short-Term Goal Priority:  Medium Start Date:                             Expected End Date:                       Follow Up Date 09/22/21    - check blood pressure weekly - choose a place to take my blood pressure (home, clinic or office, retail store) - write blood pressure results in a log or diary    Why is this important?   You won't feel high blood pressure, but it can still hurt your blood vessels.  High blood pressure can cause heart or kidney problems. It can also cause a stroke.  Making lifestyle changes like losing a little weight or eating less salt will help.  Checking your blood pressure at home and at different times of the day can help to control blood pressure.  If the doctor prescribes medicine  remember to take it the way the doctor ordered.  Call the office if you cannot afford the medicine or if there are questions about it.     Notes:        Patient Care Plan: CCM Pharmacy Care Plan     Problem Identified: Problem: Hypertension, Hyperlipidemia, Diabetes, Coronary Artery Disease, GERD, Overactive Bladder, and Memory loss, Appetite loss      Long-Range Goal: Patient-Specific Goal   Start Date: 06/20/2021  Expected End Date: 06/20/2022  This Visit's Progress: On track  Priority: High  Note:   Current Barriers:  Unable to independently monitor therapeutic efficacy Does not adhere to prescribed medication regimen Does not maintain contact with provider office  Pharmacist Clinical Goal(s):  Patient will achieve adherence to monitoring guidelines and medication adherence  to achieve therapeutic efficacy maintain control of blood pressure as evidenced by home blood pressure readings  achieve ability to self administer medications as prescribed through use of pill packing and alarms as evidenced by patient report through collaboration with PharmD and provider.   Interventions: 1:1 collaboration with Isaac Bliss, Rayford Halsted, MD regarding development and update of comprehensive plan of care as evidenced by provider attestation and co-signature Inter-disciplinary care team collaboration (see longitudinal plan of care) Comprehensive medication review performed; medication list updated in electronic medical record  Hypertension  (Status:Goal on track: NO.)   Med Management Intervention:  Follow up with PCP scheduled  (BP goal <140/90) -Uncontrolled -Current treatment: Lisinopril 20 mg 1 tablet daily Amlodipine 5 mg 1 tablet daily -Medications previously tried: none  -Current home readings: does not check regularly -Current dietary habits: eating a lot of vegetables and rice -Current exercise habits: working on her garden -Denies hypotensive/hypertensive  symptoms -Educated on BP goals and benefits of medications for prevention of heart attack, stroke and kidney damage; Exercise goal of 150 minutes per week; Importance of home blood pressure monitoring; Proper BP monitoring technique; -Counseled to monitor BP at home at least weekly, document, and provide log at future appointments -Counseled on diet and exercise extensively Recommended to continue current medication Recommended starting home blood pressure monitoring  Hyperlipidemia: (LDL goal < 70) -Not ideally controlled -Current treatment: Atorvastatin 80 mg 1 tablet daily -Medications previously tried: none  -Current dietary patterns: rice and vegetables -Current exercise habits: working in garden -Educated on Cholesterol goals;  Benefits of statin for ASCVD risk reduction; Importance of limiting foods high in cholesterol; Exercise goal of 150 minutes per week; -Counseled on diet and exercise extensively Recommended to continue current medication Recommended repeat lipid panel and consideration of addition of other lipid lowering agent.  CAD (Goal: prevent heart events) -Controlled -Current treatment  Aspirin 81 mg 1 tablet daily Atorvastatin 80 mg 1 tablet daily Nitroglycerin 0.4 mg SL tablets as needed Isosorbide mononitrate 120 mg 1 tablet daily -Medications previously tried: none  -Recommended to continue current medication Counseled on avoidance of NSAIDs with current aspirin use  Diabetes (A1c goal <7%) -Controlled -Current medications: Metformin 1000 mg 1 tablet twice daily with meals  -Medications previously tried: none  -Current home glucose readings fasting glucose: not checking post prandial glucose: not checking -Denies hypoglycemic/hyperglycemic symptoms -Current meal patterns:  breakfast: cereal with milk or skipped lunch: rice and vegetables and meat  dinner: same as lunch snacks: no snacks drinks: orange juice (powdered without much sweetener);  little water -Current exercise: working in the yard and sometimes goes to the gym -Educated on A1c and blood sugar goals; Benefits of routine self-monitoring of blood sugar; Carbohydrate counting and/or plate method -Counseled to check feet daily and get yearly eye exams -Counseled on diet and exercise extensively Recommended to continue current medication  GERD (Goal: minimize symptoms) -Controlled -Current treatment  Omeprazole 20 mg 1 capsule 30 minutes prior to eating -Medications previously tried: none  -Counseled on long term risks of taking PPIs and patient reported no symptoms. Recommended tapering to 1 capsule every other day. Educated on use of Tums for as needed heartburn  Overactive bladder (Goal: minimize symptoms) -Controlled -Current treatment  Oxybutynin 5 mg 1 tablet twice daily -Medications previously tried: none  -Recommended switching to XL to simplify regimen to once daily.  Tobacco use (Goal quit smoking) -Uncontrolled -Previous quit attempts: gum; patches -Current treatment  No medications -Patient smokes Within 30 minutes of  waking -Patient triggers include:  enjoyment -On a scale of 1-10, reports MOTIVATION to quit is 2 -On a scale of 1-10, reports CONFIDENCE in quitting is 2 -Provided contact information for [facility]uit Line (1-800-QUIT-NOW) and encouraged patient to reach out to this group for support. -Counseled on benefits of smoking cessation such as increased appetite. Recommended DEXA scan due to smoking history.  Decreased appetite (Goal: increase appetite) -Not ideally controlled -Current treatment  Mirtazapine 15 mg 1 tablet at bedtime -Medications previously tried: none  -Counseled on benefits of taking consistently. Plan to continue with current dose and could consider escalation if not effective with taking consistently. Patient's son will add to her pill dispenser.  Health Maintenance -Vaccine gaps: COVID booster (Declines),  shingrix -Current therapy:  Vitamin D 1000 units twice daily - 1000 units daily Multivitamin daily -Educated on Cost vs benefit of each product must be carefully weighed by individual consumer -Patient is satisfied with current therapy and denies issues -Counseled on recommended amounts of calcium (1200 mg total) and vitamin D (1000 units) per day to support bone health. Recommended supplementation with calcium citrate 600 mg per day.  Patient Goals/Self-Care Activities Patient will:  - take medications as prescribed focus on medication adherence by use of pill tower and packaging check blood pressure weekly, document, and provide at future appointments target a minimum of 150 minutes of moderate intensity exercise weekly  Follow Up Plan: Telephone follow up appointment with care management team member scheduled for: 6 months      [name] was given information about Chronic Care Management services today including:  CCM service includes personalized support from designated clinical staff supervised by her physician, including individualized plan of care and coordination with other care providers 24/7 contact phone numbers for assistance for urgent and routine care needs. Standard insurance, coinsurance, copays and deductibles apply for chronic care management only during months in which we provide at least 20 minutes of these services. Most insurances cover these services at 100%, however patients may be responsible for any copay, coinsurance and/or deductible if applicable. This service may help you avoid the need for more expensive face-to-face services. Only one practitioner may furnish and bill the service in a calendar month. The patient may stop CCM services at any time (effective at the end of the month) by phone call to the office staff.  Patient agreed to services and verbal consent obtained.   The patient verbalized understanding of instructions, educational  materials, and care plan provided today and agreed to receive a mailed copy of patient instructions, educational materials, and care plan.  The pharmacy team will reach out to the patient again over the next 60 days.   Viona Gilmore, [facility]

## 2021-06-26 ENCOUNTER — Telehealth: Payer: Self-pay | Admitting: Internal Medicine

## 2021-06-26 ENCOUNTER — Other Ambulatory Visit: Payer: Self-pay

## 2021-06-26 NOTE — Telephone Encounter (Signed)
Son is aware.

## 2021-06-26 NOTE — Telephone Encounter (Signed)
PT son would like to be on the phone with the PT when she is here for her in office apt. PT son did state that she may need to be reminded to call him and to put phone on speaker. PT son states as she would not be able to come.

## 2021-06-27 ENCOUNTER — Ambulatory Visit: Payer: PPO | Admitting: Internal Medicine

## 2021-06-28 ENCOUNTER — Other Ambulatory Visit: Payer: Self-pay

## 2021-06-28 ENCOUNTER — Ambulatory Visit (INDEPENDENT_AMBULATORY_CARE_PROVIDER_SITE_OTHER): Payer: PPO | Admitting: Internal Medicine

## 2021-06-28 ENCOUNTER — Encounter: Payer: Self-pay | Admitting: Internal Medicine

## 2021-06-28 VITALS — BP 130/70 | HR 74 | Temp 99.4°F | Wt 77.2 lb

## 2021-06-28 DIAGNOSIS — I700 Atherosclerosis of aorta: Secondary | ICD-10-CM

## 2021-06-28 DIAGNOSIS — I25118 Atherosclerotic heart disease of native coronary artery with other forms of angina pectoris: Secondary | ICD-10-CM

## 2021-06-28 DIAGNOSIS — Z720 Tobacco use: Secondary | ICD-10-CM

## 2021-06-28 DIAGNOSIS — I10 Essential (primary) hypertension: Secondary | ICD-10-CM | POA: Diagnosis not present

## 2021-06-28 DIAGNOSIS — E119 Type 2 diabetes mellitus without complications: Secondary | ICD-10-CM | POA: Diagnosis not present

## 2021-06-28 DIAGNOSIS — E43 Unspecified severe protein-calorie malnutrition: Secondary | ICD-10-CM

## 2021-06-28 DIAGNOSIS — K146 Glossodynia: Secondary | ICD-10-CM | POA: Diagnosis not present

## 2021-06-28 DIAGNOSIS — E785 Hyperlipidemia, unspecified: Secondary | ICD-10-CM

## 2021-06-28 LAB — POCT GLYCOSYLATED HEMOGLOBIN (HGB A1C): Hemoglobin A1C: 5.5 % (ref 4.0–5.6)

## 2021-06-28 MED ORDER — MIRTAZAPINE 30 MG PO TABS: 30.0000 mg | ORAL_TABLET | Freq: Every day | ORAL | 1 refills | Status: DC

## 2021-06-28 NOTE — Progress Notes (Signed)
Established Patient Office Visit     This visit occurred during the SARS-CoV-2 public health emergency.  Safety protocols were in place, including screening questions prior to the visit, additional usage of staff PPE, and extensive cleaning of exam room while observing appropriate contact time as indicated for disinfecting solutions.    CC/Reason for Visit: Follow-up chronic medical conditions  HPI: [name] is a 87 y.o. female who is coming in today for the above mentioned reasons.  I have not seen her since August 2021.  She has however been seeing her cardiologist and neurologist.  She has a history of hypertension, type 2 diabetes, mild to moderate Alzheimer's dementia, ongoing nicotine dependence of about a pack and half a day for her entire life, coronary artery disease, aortic atherosclerosis and severe protein caloric malnutrition.  She weighs only 77 pounds today.  She weighed 86 pounds at her visit exactly 1 year ago.  She states she does not have much of an appetite.  She otherwise feels well and has no complaints.  She is still driving, she lives independently, has a brother who helps.  Her son lives out of state.  I have spoken with him on the phone today.  He relates that she is doing better with medication compliance since they have installed a pill dispenser.  She still sometimes misses doses.  He agrees that her appetite is quite poor.  Past Medical/Surgical History: Past Medical History:  Diagnosis Date   At risk for medication noncompliance    CAD (coronary artery disease)    a. borderline disease by CT 08/2018.   Diabetes mellitus, type 2 ([place])    History of cervical cancer    Hyperlipidemia    Hypertension    Mitral regurgitation    severe MR by echo 10/2020 // TEE 1/22: EF 60-65, normal RVSF, mild LAE, mod MR   Noncompliance with medication treatment due to underuse of medication    Raynaud's phenomenon    TN (trigeminal neuralgia)     Past  Surgical History:  Procedure Laterality Date   BREAST BIOPSY     BREAST ENHANCEMENT SURGERY     CHOLECYSTECTOMY N/A 02/14/2014   Procedure: LAPAROSCOPIC CHOLECYSTECTOMY;  Surgeon: Ralene Ok, MD;  Location: [facility];  Service: General;  Laterality: N/A;   ERCP N/A 03/08/2015   Procedure: ENDOSCOPIC RETROGRADE CHOLANGIOPANCREATOGRAPHY (ERCP);  Surgeon: Ladene Artist, MD;  Location: [facility];  Service: Endoscopy;  Laterality: N/A;   MASS EXCISION Right 12/25/2016   Procedure: EXCISION RIGHT SCALP MASS SEBACEOUS CYST;  Surgeon: Coralie Keens, MD;  Location: [place];  Service: General;  Laterality: Right;   TEE WITHOUT CARDIOVERSION N/A 12/12/2020   Procedure: TRANSESOPHAGEAL ECHOCARDIOGRAM (TEE);  Surgeon: Sueanne Margarita, MD;  Location: [facility] END[facility];  Service: Cardiovascular;  Laterality: N/A;   [place]   for cervical cancer in situ    Social History:  reports that she has been smoking cigarettes. She has a 90.00 pack-year smoking history. She has never used smokeless tobacco. She reports that she does not drink alcohol and does not use drugs.  Allergies: Allergies  Allergen Reactions   Shrimp (Diagnostic) Itching   Morphine And Related Itching    Short lived, mild itching.    Family History:  Family History  Problem Relation Age of Onset   CAD Neg Hx    Diabetes Mellitus II Neg Hx      Current Outpatient Medications:    amLODipine (NORVASC) 5  MG tablet, TAKE ONE TABLET BY MOUTH ONCE DAILY (AM), Disp: 90 tablet, Rfl: 3   aspirin 81 MG EC tablet, Take 1 tablet (81 mg total) by mouth daily. Swallow whole., Disp: 30 tablet, Rfl: 12   atorvastatin (LIPITOR) 80 MG tablet, TAKE ONE TABLET BY MOUTH ONCE DAILY (BEDTIME), Disp: 90 tablet, Rfl: 3   Blood Glucose Monitoring Suppl (ONE TOUCH ULTRA MINI) w/Device KIT, USE TO CHECK BLOOD SUGAR DAILY AND AS NEEDED, Disp: 1 each, Rfl: 0   cholecalciferol (VITAMIN D3) 25 MCG (1000 UNIT) tablet, Take 1,000 Units by  mouth in the morning and at bedtime., Disp: , Rfl:    donepezil (ARICEPT) 10 MG tablet, Take 1 tablet (10 mg total) by mouth at bedtime., Disp: 90 tablet, Rfl: 3   glucose blood (ACCU-CHEK GUIDE) test strip, Use as instructed, Disp: 100 each, Rfl: 12   isosorbide mononitrate (IMDUR) 120 MG 24 hr tablet, TAKE 1 TABLET (120 MG TOTAL) BY MOUTH DAILY., Disp: 90 tablet, Rfl: 2   Lancets (ONETOUCH ULTRASOFT) lancets, Once daily or PRN, Dx E11.9, Disp: 100 each, Rfl: 12   lisinopril (ZESTRIL) 20 MG tablet, TAKE ONE TABLET BY MOUTH ONCE DAILY (AM), Disp: 90 tablet, Rfl: 3   metFORMIN (GLUCOPHAGE) 1000 MG tablet, TAKE ONE TABLET BY MOUTH TWICE DAILY WITH A MEAL (AM AND BEDTIME), Disp: 180 tablet, Rfl: 0   mirtazapine (REMERON) 30 MG tablet, Take 1 tablet (30 mg total) by mouth at bedtime., Disp: 90 tablet, Rfl: 1   Multiple Vitamins-Minerals (ONE-DAILY MULTI-VIT/MINERAL) TABS, TAKE 1 TABLET BY MOUTH DAILY (AM), Disp: 90 tablet, Rfl: 0   nitroGLYCERIN (NITROSTAT) 0.4 MG SL tablet, PLACE 1 TABLET (0.4 MG TOTAL) UNDER THE TONGUE EVERY 5 (FIVE) MINUTES AS NEEDED FOR CHEST PAIN., Disp: 50 tablet, Rfl: 3   omeprazole (PRILOSEC) 20 MG capsule, TAKE 1 CAPSULE BY MOUTH 30 MINUTES BEFORE MORNING MEAL, Disp: 90 capsule, Rfl: 0   oxybutynin (DITROPAN) 5 MG tablet, TAKE ONE TABLET BY MOUTH TWICE DAILY, Disp: 180 tablet, Rfl: 0  Review of Systems:  Constitutional: Denies fever, chills, diaphoresis and fatigue.  HEENT: Denies photophobia, eye pain, redness, hearing loss, ear pain, congestion, sore throat, rhinorrhea, sneezing, mouth sores, trouble swallowing, neck pain, neck stiffness and tinnitus.   Respiratory: Denies SOB, DOE, cough, chest tightness,  and wheezing.   Cardiovascular: Denies chest pain, palpitations and leg swelling.  Gastrointestinal: Denies nausea, vomiting, abdominal pain, diarrhea, constipation, blood in stool and abdominal distention.  Genitourinary: Denies dysuria, urgency, frequency, hematuria,  flank pain and difficulty urinating.  Endocrine: Denies: hot or cold intolerance, sweats, changes in hair or nails, polyuria, polydipsia. Musculoskeletal: Denies myalgias, back pain, joint swelling, arthralgias and gait problem.  Skin: Denies pallor, rash and wound.  Neurological: Denies dizziness, seizures, syncope, weakness, light-headedness, numbness and headaches.  Hematological: Denies adenopathy. Easy bruising, personal or family bleeding history  Psychiatric/Behavioral: Denies suicidal ideation, mood changes, confusion, nervousness, sleep disturbance and agitation    Physical Exam: Vitals:   06/28/21 1357  BP: 130/70  Pulse: 74  Temp: 99.4 F (37.4 C)  TempSrc: Oral  SpO2: 97%  Weight: 77 lb 3.2 oz (35 kg)    Body mass index is 15.08 kg/m.   Constitutional: NAD, calm, comfortable Eyes: PERRL, lids and conjunctivae normal ENMT: Mucous membranes are moist.  Respiratory: clear to auscultation bilaterally, no wheezing, no crackles. Normal respiratory effort. No accessory muscle use.  Cardiovascular: Regular rate and rhythm, no murmurs / rubs / gallops. No extremity edema.  Neurologic: Grossly  intact and nonfocal Psychiatric: Normal judgment and insight. Alert and oriented x 3. Normal mood.    Impression and Plan:  Diabetes mellitus type 2 in nonobese ([place])  - Plan: POCT glycosylated hemoglobin (Hb A1C), CBC with Differential/Platelet, Comprehensive metabolic panel, TSH, TSH, Comprehensive metabolic panel, CBC with Differential/Platelet -Excellent control with an A1c of 5.5 today.  Essential hypertension -Blood pressures well controlled.  Dyslipidemia  - Plan: Lipid panel, Lipid panel  Tobacco abuse -She smoked a pack and half a day, she has no interest in quitting smoking.  Coronary artery disease of native artery of native heart with stable angina pectoris ([place]) Aortic atherosclerosis ([place]) -She follows routinely with cardiology, she is on statin medication,  she used to have stable angina but declines recurrence of dyspnea on exertion or chest pain recently.  Severe protein-calorie malnutrition ([place])  - Plan: mirtazapine (REMERON) 30 MG tablet -This is my biggest concern today.  She has lost 15% more of her body weight in the past year, only weighs 77 pounds today. -I will increase her mirtazapine from 15 to 30 mg to see if this helps stimulate her appetite. -I will also place a dietitian referral.  Tongue burning sensation  -Concerned about vitamin and mineral deficiencies. -Per son she is on a multivitamin and additional vitamin D supplementation. -Have advised an additional B complex vitamin, possibly adding calcium and B12 supplementation pending lab results.  Time spent: 35 minutes reviewing chart, interviewing and examining patient, formulating plan of care, calling son and power of attorney to provide updates.     Lelon Frohlich, MD [facility]

## 2021-06-29 LAB — CBC WITH DIFFERENTIAL/PLATELET
Basophils Absolute: 0.1 10*3/uL (ref 0.0–0.1)
Basophils Relative: 1.3 % (ref 0.0–3.0)
Eosinophils Absolute: 0.1 10*3/uL (ref 0.0–0.7)
Eosinophils Relative: 0.9 % (ref 0.0–5.0)
HCT: 38.2 % (ref 36.0–46.0)
Hemoglobin: 12.7 g/dL (ref 12.0–15.0)
Lymphocytes Relative: 31.6 % (ref 12.0–46.0)
Lymphs Abs: 1.9 10*3/uL (ref 0.7–4.0)
MCHC: 33.1 g/dL (ref 30.0–36.0)
MCV: 97 fl (ref 78.0–100.0)
Monocytes Absolute: 0.4 10*3/uL (ref 0.1–1.0)
Monocytes Relative: 7 % (ref 3.0–12.0)
Neutro Abs: 3.5 10*3/uL (ref 1.4–7.7)
Neutrophils Relative %: 59.2 % (ref 43.0–77.0)
Platelets: 241 10*3/uL (ref 150.0–400.0)
RBC: 3.94 Mil/uL (ref 3.87–5.11)
RDW: 14 % (ref 11.5–15.5)
WBC: 6 10*3/uL (ref 4.0–10.5)

## 2021-06-29 LAB — COMPREHENSIVE METABOLIC PANEL
ALT: 14 U/L (ref 0–35)
AST: 20 U/L (ref 0–37)
Albumin: 4 g/dL (ref 3.5–5.2)
Alkaline Phosphatase: 72 U/L (ref 39–117)
BUN: 20 mg/dL (ref 6–23)
CO2: 30 mEq/L (ref 19–32)
Calcium: 9.8 mg/dL (ref 8.4–10.5)
Chloride: 105 mEq/L (ref 96–112)
Creatinine, Ser: 0.97 mg/dL (ref 0.40–1.20)
GFR: 53.31 mL/min — ABNORMAL LOW (ref >60.00)
Glucose, Bld: 90 mg/dL (ref 70–99)
Potassium: 4.1 mEq/L (ref 3.5–5.1)
Sodium: 142 mEq/L (ref 135–145)
Total Bilirubin: 0.5 mg/dL (ref 0.2–1.2)
Total Protein: 6.3 g/dL (ref 6.0–8.3)

## 2021-06-29 LAB — TSH: TSH: 1.38 u[IU]/mL (ref 0.35–5.50)

## 2021-06-29 LAB — LIPID PANEL
Cholesterol: 158 mg/dL (ref 0–200)
HDL: 60.2 mg/dL (ref >39.00)
LDL Cholesterol: 75 mg/dL (ref 0–99)
NonHDL: 97.68
Total CHOL/HDL Ratio: 3
Triglycerides: 112 mg/dL (ref 0.0–149.0)
VLDL: 22.4 mg/dL (ref 0.0–40.0)

## 2021-06-29 LAB — VITAMIN B12: Vitamin B-12: 442 pg/mL (ref 211–911)

## 2021-06-29 LAB — VITAMIN D 25 HYDROXY (VIT D DEFICIENCY, FRACTURES): VITD: 73.4 ng/mL (ref 30.00–100.00)

## 2021-07-02 ENCOUNTER — Encounter: Payer: PPO | Attending: Internal Medicine | Admitting: Dietician

## 2021-08-03 ENCOUNTER — Telehealth: Payer: Self-pay | Admitting: Pharmacist

## 2021-08-03 NOTE — Chronic Care Management (AMB) (Signed)
Chronic Care Management Pharmacy Assistant   Name: [name]  MRN: [id number] DOB: 01/25/1934  Reason for Encounter: Disease State/ Hypertension Assessment Call    Conditions to be addressed/monitored: HTN  Recent office visits:  06-28-2021 Isaac Bliss, Rayford Halsted, MD - Patient presented for Diabetes mellitus type 2 in nonobese . Changed mirtazapine to 30 mg daily.  Recent consult visits:  None   Hospital visits:  None in previous 6 months  Medications: Outpatient Encounter Medications as of 08/03/2021  Medication Sig   amLODipine (NORVASC) 5 MG tablet TAKE ONE TABLET BY MOUTH ONCE DAILY (AM)   aspirin 81 MG EC tablet Take 1 tablet (81 mg total) by mouth daily. Swallow whole.   atorvastatin (LIPITOR) 80 MG tablet TAKE ONE TABLET BY MOUTH ONCE DAILY (BEDTIME)   Blood Glucose Monitoring Suppl (ONE TOUCH ULTRA MINI) w/Device KIT USE TO CHECK BLOOD SUGAR DAILY AND AS NEEDED   cholecalciferol (VITAMIN D3) 25 MCG (1000 UNIT) tablet Take 1,000 Units by mouth in the morning and at bedtime.   donepezil (ARICEPT) 10 MG tablet Take 1 tablet (10 mg total) by mouth at bedtime.   glucose blood (ACCU-CHEK GUIDE) test strip Use as instructed   isosorbide mononitrate (IMDUR) 120 MG 24 hr tablet TAKE 1 TABLET (120 MG TOTAL) BY MOUTH DAILY.   Lancets (ONETOUCH ULTRASOFT) lancets Once daily or PRN, Dx E11.9   lisinopril (ZESTRIL) 20 MG tablet TAKE ONE TABLET BY MOUTH ONCE DAILY (AM)   metFORMIN (GLUCOPHAGE) 1000 MG tablet TAKE ONE TABLET BY MOUTH TWICE DAILY WITH A MEAL (AM AND BEDTIME)   mirtazapine (REMERON) 30 MG tablet Take 1 tablet (30 mg total) by mouth at bedtime.   Multiple Vitamins-Minerals (ONE-DAILY MULTI-VIT/MINERAL) TABS TAKE 1 TABLET BY MOUTH DAILY (AM)   nitroGLYCERIN (NITROSTAT) 0.4 MG SL tablet PLACE 1 TABLET (0.4 MG TOTAL) UNDER THE TONGUE EVERY 5 (FIVE) MINUTES AS NEEDED FOR CHEST PAIN.   omeprazole (PRILOSEC) 20 MG capsule TAKE 1 CAPSULE BY MOUTH 30 MINUTES  BEFORE MORNING MEAL   oxybutynin (DITROPAN) 5 MG tablet TAKE ONE TABLET BY MOUTH TWICE DAILY   No facility-administered encounter medications on file as of 08/03/2021.  Reviewed chart prior to disease state call. Spoke with patient regarding BP  Recent Office Vitals: BP Readings from Last 3 Encounters:  06/28/21 130/70  01/11/21 120/70  01/01/21 (!) 147/66   Pulse Readings from Last 3 Encounters:  06/28/21 74  01/11/21 (!) 57  01/01/21 100    Wt Readings from Last 3 Encounters:  06/28/21 77 lb 3.2 oz (35 kg)  01/11/21 83 lb 3.2 oz (37.7 kg)  01/01/21 82 lb 9.6 oz (37.5 kg)     Kidney Function Lab Results  Component Value Date/Time   CREATININE 0.97 06/28/2021 02:15 PM   CREATININE 1.10 (H) 12/08/2020 01:33 PM   CREATININE 0.88 11/24/2017 12:37 PM   GFR 53.31 (L) 06/28/2021 02:15 PM   GFRNONAA 46 (L) 12/08/2020 01:33 PM   GFRAA 53 (L) 12/08/2020 01:33 PM    BMP Latest Ref Rng & Units 06/28/2021 12/08/2020 04/25/2020  Glucose 70 - 99 mg/dL 90 164(H) 91  BUN 6 - 23 mg/dL '20 21 16  ' Creatinine 0.40 - 1.20 mg/dL 0.97 1.10(H) 0.85  BUN/Creat Ratio 12 - 28 - 19 -  Sodium 135 - 145 mEq/L 142 146(H) 142  Potassium 3.5 - 5.1 mEq/L 4.1 4.4 4.0  Chloride 96 - 112 mEq/L 105 108(H) 106  CO2 19 - 32 mEq/L 30 22 26  Calcium 8.4 - 10.5 mg/dL 9.8 9.6 9.9   Spoke to [employer] for the call  Current antihypertensive regimen:  Lisinopril 20 mg 1 tablet daily Amlodipine 5 mg 1 tablet daily How often are you checking your Blood Pressure? He reports she has never started checking her pressures and probably will not start, Advised him of the recommendation for at least once a week. Current home BP readings: Son reports she is not checking 130/70 was last visit in Aug reading What recent interventions/DTPs have been made by any provider to improve Blood Pressure control since last CPP Visit: Son reports no changes Any recent hospitalizations or ED visits since last visit with CPP? No What diet  changes have been made to improve Blood Pressure Control?  Son reports she does not eat very much reports that she says she does not feel hungry. She has meal shakes but does not drink them. He reports her diet consists of rice and vegetables and a meat. She drinks juice or water with her medication and likes Coffee. What exercise is being done to improve your Blood Pressure Control?  Son reports she is still quite active still drives and does not use a walker or cane. He reports she has a garden she tends to as well as a COI pond with fish and she stays out in the yard.  Adherence Review: Is the patient currently on ACE/ARB medication? Yes Does the patient have >5 day gap between last estimated fill dates? No  Notes: Son reported they did decreased the Omeprazole he was unsure if it has helped her or not as well as changed the times she takes her medication . He reports they did enroll her in a system with the pharmacy and she has a cassette that beeps when it is time for her to take her medication, that seems to be working.   Care Gaps: Eye Exam - Overdue Foot Exam - Overdue CCM Call Scheduled for  12-18-21 AWV - MSG sent to Ramond Craver CMA to schedule.  Star Rating Drugs: Metformin (Glucophage) 1000 mg - Last filled 07-18-2021 30 DS at [facility] Lisinopril (Zestril) 20 mg - Last filled 07-18-2021 30 DS at [facility] Atorvastatin (Lipitor) 80 mg - Last filled 07-18-2021 30 DS at [facility] Pharmacist Assistant [phone]

## 2021-09-12 ENCOUNTER — Other Ambulatory Visit: Payer: Self-pay | Admitting: Internal Medicine

## 2021-09-12 ENCOUNTER — Other Ambulatory Visit: Payer: Self-pay | Admitting: Physician Assistant

## 2021-09-12 DIAGNOSIS — E119 Type 2 diabetes mellitus without complications: Secondary | ICD-10-CM

## 2021-09-18 ENCOUNTER — Encounter: Payer: Self-pay | Admitting: Neurology

## 2021-09-18 ENCOUNTER — Telehealth: Payer: Self-pay | Admitting: Neurology

## 2021-09-18 ENCOUNTER — Ambulatory Visit (INDEPENDENT_AMBULATORY_CARE_PROVIDER_SITE_OTHER): Payer: PPO | Admitting: Neurology

## 2021-09-18 ENCOUNTER — Other Ambulatory Visit: Payer: Self-pay

## 2021-09-18 VITALS — BP 132/54 | HR 108 | Ht 60.0 in | Wt 77.6 lb

## 2021-09-18 DIAGNOSIS — F03A0 Unspecified dementia, mild, without behavioral disturbance, psychotic disturbance, mood disturbance, and anxiety: Secondary | ICD-10-CM

## 2021-09-18 MED ORDER — DONEPEZIL HCL 10 MG PO TABS: 10.0000 mg | ORAL_TABLET | Freq: Every day | ORAL | 3 refills | Status: DC

## 2021-09-18 NOTE — Progress Notes (Signed)
NEUROLOGY FOLLOW UP OFFICE NOTE  [name] [id number] 01-29-1934  HISTORY OF PRESENT ILLNESS: I had the pleasure of seeing [name] in follow-up in the neurology clinic on 09/18/2021.  The patient was last seen 8 months ago for mild dementia. Her son [name] is again present on speakerphone to provide additional information [phone] Since her last visit, she feels her memory is okay. She is on Donepezil 105m daily without side effects. She lives alone, her brother lives next door and checks on her. She denies getting lost driving, [place] She used to drive to the gym but since the pool closed, she stopped going. She denies missing bill payments or medications. JClair Gullingreports that he has a friend coming every 2 weeks to fill her pillbox with alarm. Medication compliance is "hit or miss," she is 50-60% compliant when his friend reports back. JClair Gullingis also able to keep track on an app. She states she cooks and denies leaving the stove on. JClair Gullingreports she is still fairly independent. The main thing is she has slowed down, it takes longer to "get from A to B, such as when answering the doorbell." Biggest issues healthwise is she has no appetite and has been losing weight. She says she is not hungry. JClair Gullingfeels that when she does not eat, she would not take her medication. She was started on Remeron but does not seem very compliant. She denies any headaches, dizziness, vision changes, no falls. Sleep is good. In the summer months, she likes working in the yard on her plants, but JClair Gullingis not sure what she does in the winter. She shows her hands which feels cold and have bluish discoloration R>L.   History on Initial Assessment 11/30/2019: This is a pleasant 87year old right-handed woman with a history of hypertension, hyperlipidemia, diabetes, cervical cancer, trigeminal neuralgia, presenting for evaluation of memory loss. She feels her memory changed 2-3 years  ago where she has difficulty remembering names. Family started noticing changes in the past year. She lives alone and denies getting lost driving. She is very active with [place] [employer] and visits daily to help them, praying every Sunday. Her son JClair Gullingreports she forgot how to get to the temple a few months ago. She denies missing bill payments. JClair Gullingreports her husband had managed the bills until he passed away 3 years ago. JClair Gullinghas noticed she would not recall who she wrote checks for, and would not be aware of how much money is in her account, causing overdraws. She denies misplacing things, however JClair Gullingreports that they tried to return clothes last month and they could not find the card she used. She eventually found the card, but JClair Gullingfound out she had opened up another new credit card account. JClair Gullinghad expressed concern about medication compliance, her last HbA1c was 8.5. Medications were switched to a pillpack, which has helped, however she has a visiting nurse come twice a week who notes that she would have left over doses on the pillpack. She would not be able to verify which pouch she took them from. Family also noticed personality changes last Spring, she seemed to zone out during her granddaughter's graduation. She is usually bubbly and very social, they noticed she was less active, falling asleep and more tired. She is independent with dressing and bathing, no hygiene concerns. She has 2 dogs. She states her mood is okay and sleep is very good. There is no family  history of dementia, no history of significant head injuries or alcohol use.  She denies any headaches, dizziness, dysarthria, dysphagia, neck/back pain, focal numbness/tingling/weakness, bowel/bladder dysfunction. No anosmia, tremors, no falls. Vision is sometimes blurred.  Laboratory Data: Lab Results  Component Value Date   TSH 1.94 04/25/2020   Lab Results  Component Value Date   [id number] 04/25/2020   MRI brain without  contrast in January 2021 did not show any acute changes. There was moderate cortical loss in the frontal and parietal lobes, scattered small hyperintensities in the bilateral white matter (mild chronic microvascular disease).  PAST MEDICAL HISTORY: Past Medical History:  Diagnosis Date   At risk for medication noncompliance    CAD (coronary artery disease)    a. borderline disease by CT 08/2018.   Diabetes mellitus, type 2 ([place])    History of cervical cancer    Hyperlipidemia    Hypertension    Mitral regurgitation    severe MR by echo 10/2020 // TEE 1/22: EF 60-65, normal RVSF, mild LAE, mod MR   Noncompliance with medication treatment due to underuse of medication    Raynaud's phenomenon    TN (trigeminal neuralgia)     MEDICATIONS: Current Outpatient Medications on File Prior to Visit  Medication Sig Dispense Refill   amLODipine (NORVASC) 5 MG tablet TAKE ONE TABLET BY MOUTH ONCE DAILY (AM) 90 tablet 1   ASPIRIN LOW DOSE 81 MG EC tablet TAKE ONE TABLET BY MOUTH DAILY AT BEDTIME. 90 tablet 1   atorvastatin (LIPITOR) 80 MG tablet TAKE ONE TABLET BY MOUTH ONCE DAILY (BEDTIME) 90 tablet 1   Blood Glucose Monitoring Suppl (ONE TOUCH ULTRA MINI) w/Device KIT USE TO CHECK BLOOD SUGAR DAILY AND AS NEEDED 1 each 0   cholecalciferol (VITAMIN D3) 25 MCG (1000 UNIT) tablet Take 1,000 Units by mouth in the morning and at bedtime.     donepezil (ARICEPT) 10 MG tablet Take 1 tablet (10 mg total) by mouth at bedtime. 90 tablet 3   glucose blood (ACCU-CHEK GUIDE) test strip Use as instructed 100 each 12   isosorbide mononitrate (IMDUR) 120 MG 24 hr tablet TAKE 1 TABLET (120 MG TOTAL) BY MOUTH DAILY. 90 tablet 2   Lancets (ONETOUCH ULTRASOFT) lancets Once daily or PRN, Dx E11.9 100 each 12   lisinopril (ZESTRIL) 20 MG tablet TAKE ONE TABLET BY MOUTH ONCE DAILY (AM) 90 tablet 1   metFORMIN (GLUCOPHAGE) 1000 MG tablet TAKE ONE TABLET BY MOUTH TWICE DAILY WITH A MEAL (AM AND BEDTIME) 180 tablet 1    mirtazapine (REMERON) 30 MG tablet Take 1 tablet (30 mg total) by mouth at bedtime. 90 tablet 1   Multiple Vitamins-Minerals (ONE-DAILY MULTI-VIT/MINERAL) TABS TAKE 1 TABLET BY MOUTH DAILY (AM) 90 tablet 1   nitroGLYCERIN (NITROSTAT) 0.4 MG SL tablet PLACE 1 TABLET (0.4 MG TOTAL) UNDER THE TONGUE EVERY 5 (FIVE) MINUTES AS NEEDED FOR CHEST PAIN. 50 tablet 3   omeprazole (PRILOSEC) 20 MG capsule TAKE 1 CAPSULE BY MOUTH 30 MINUTES BEFORE MORNING MEAL 90 capsule 1   oxybutynin (DITROPAN) 5 MG tablet TAKE ONE TABLET BY MOUTH TWICE DAILY (AM+BEDTIME) 180 tablet 1   No current facility-administered medications on file prior to visit.    ALLERGIES: Allergies  Allergen Reactions   Shrimp (Diagnostic) Itching   Morphine And Related Itching    Short lived, mild itching.    FAMILY HISTORY: Family History  Problem Relation Age of Onset   CAD Neg Hx    Diabetes Mellitus  II Neg Hx     SOCIAL HISTORY: Social History   Socioeconomic History   Marital status: Widowed    Spouse name: [name]   Number of children: 1   Years of education: Not on file   Highest education level: Not on file  Occupational History   Occupation: Unemployed    Employer: NOT EMPLOYED  Tobacco Use   Smoking status: Every Day    Packs/day: 1.50    Years: 60.00    Pack years: 90.00    Types: Cigarettes   Smokeless tobacco: Never  Vaping Use   Vaping Use: Never used  Substance and Sexual Activity   Alcohol use: No   Drug use: No   Sexual activity: Not on file  Other Topics Concern   Not on file  Social History Narrative   Married.  Lives in [place] with her husband.  No FH of heart disease.   1 adult son   + smoker   No EtOH   Right handed    Social Determinants of Health   Financial Resource Strain: Low Risk    Difficulty of Paying Living Expenses: Not hard at all  Food Insecurity: Not on file  Transportation Needs: No Transportation Needs   Lack of Transportation (Medical): No   Lack of  Transportation (Non-Medical): No  Physical Activity: Not on file  Stress: Not on file  Social Connections: Not on file  Intimate Partner Violence: Not on file     PHYSICAL EXAM: Vitals:   09/18/21 1016  BP: (!) 132/54  Pulse: (!) 108  SpO2: 96%   General: No acute distress Head:  Normocephalic/atraumatic Skin/Extremities: No rash, no edema Neurological Exam: alert and oriented to person, place, and time. No aphasia or dysarthria. Fund of knowledge is appropriate.  Recent and remote memory are intact, 3/3 delayed recall.  Attention and concentration are normal. She states she does not know how to spell, she is able to do serial 7s 5/5. Cranial nerves: Pupils equal, round. Extraocular movements intact with no nystagmus. Visual fields full.  No facial asymmetry.  Motor: Bulk and tone normal, muscle strength 5/5 throughout with no pronator drift.   Finger to nose testing intact.  Gait slow and cautious, no ataxia. No tremor   IMPRESSION: This is a pleasant 87 yo RH woman with a history of hypertension, hyperlipidemia, diabetes, cervical cancer, trigeminal neuralgia, with mild dementia, likely due to Alzheimer's disease. MRI brain showed moderate cortical loss in the frontal and parietal lobes, mild chronic microvascular disease. MMSE 25/30 in 12/2020. She denies any issues, [name] notes 50-60% compliance with medications. Discussed increasing supervision with medication, patient encouraged to eat small frequent meals and take medications regularly. Refills sent for Donepezil 15m daily.  She drives minimally, continue to monitor. Follow-up in 6 months with [facility] PA SSharene Butters they know to call for any changes.    Thank you for allowing me to participate in her care.  Please do not hesitate to call for any questions or concerns.   KEllouise Newer M.D.   CC: Dr. ADeniece Ree

## 2021-09-18 NOTE — Patient Instructions (Signed)
Good to see you!  Continue Donepezil 10mg  daily  2. Please try to eat small meals every 2 hours and take medications even if you did not have much to eat.  3. Follow-up in 6 months, call for any changes   FALL PRECAUTIONS: Be cautious when walking. Scan the area for obstacles that may increase the risk of trips and falls. When getting up in the mornings, sit up at the edge of the bed for a few minutes before getting out of bed. Consider elevating the bed at the head end to avoid drop of blood pressure when getting up. Walk always in a well-lit room (use night lights in the walls). Avoid area rugs or power cords from appliances in the middle of the walkways. Use a walker or a cane if necessary and consider physical therapy for balance exercise. Get your eyesight checked regularly.  FINANCIAL OVERSIGHT: Supervision, especially oversight when making financial decisions or transactions is also recommended as difficulties arise.  HOME SAFETY: Consider the safety of the kitchen when operating appliances like stoves, microwave oven, and blender. Consider having supervision and share cooking responsibilities until no longer able to participate in those. Accidents with firearms and other hazards in the house should be identified and addressed as well.  DRIVING: Regarding driving, in patients with progressive memory problems, driving will be impaired. We advise to have someone else do the driving if trouble finding directions or if minor accidents are reported. Independent driving assessment is available to determine safety of driving.  ABILITY TO BE LEFT ALONE: If patient is unable to contact 911 operator, consider using LifeLine, or when the need is there, arrange for someone to stay with patients. Smoking is a fire hazard, consider supervision or cessation. Risk of wandering should be assessed by caregiver and if detected at any point, supervision and safe proof recommendations should be  instituted.  MEDICATION SUPERVISION: Inability to self-administer medication needs to be constantly addressed. Implement a mechanism to ensure safe administration of the medications.  RECOMMENDATIONS FOR ALL PATIENTS WITH MEMORY PROBLEMS: 1. Continue to exercise (Recommend 30 minutes of walking everyday, or 3 hours every week) 2. Increase social interactions - continue going to [place] and enjoy social gatherings with friends and family 3. Eat healthy, avoid fried foods and eat more fruits and vegetables 4. Maintain adequate blood pressure, blood sugar, and blood cholesterol level. Reducing the risk of stroke and cardiovascular disease also helps promoting better memory. 5. Avoid stressful situations. Live a simple life and avoid aggravations. Organize your time and prepare for the next day in anticipation. 6. Sleep well, avoid any interruptions of sleep and avoid any distractions in the bedroom that may interfere with adequate sleep quality 7. Avoid sugar, avoid sweets as there is a strong link between excessive sugar intake, diabetes, and cognitive impairment The Mediterranean diet has been shown to help patients reduce the risk of progressive memory disorders and reduces cardiovascular risk. This includes eating fish, eat fruits and green leafy vegetables, nuts like almonds and hazelnuts, walnuts, and also use olive oil. Avoid fast foods and fried foods as much as possible. Avoid sweets and sugar as sugar use has been linked to worsening of memory function.  There is always a concern of gradual progression of memory problems. If this is the case, then we may need to adjust level of care according to patient needs. Support, both to the patient and caregiver, should then be put into place.

## 2021-09-18 NOTE — Telephone Encounter (Signed)
Pt son called, he is unable to come to moms apt this am at 89- he would like a call from nurse or doctor to let him know what was said. He said his mother wont remember anything. [phone]

## 2021-09-18 NOTE — Telephone Encounter (Signed)
Son joined in visit today on speakerphone.

## 2021-09-26 ENCOUNTER — Telehealth: Payer: Self-pay | Admitting: Internal Medicine

## 2021-09-26 NOTE — Telephone Encounter (Signed)
fyi

## 2021-09-26 NOTE — Telephone Encounter (Signed)
Jana Half an NP is calling from [facility] to let Dr.Hernandez know that she has seen patient yesterday. Jana Half states that they at [facility] do not take the place of pcp, but that they have established care. [facility] is a benefit of patient's insurance company and they see patient in home.       Good callback number for any questions is [phone]

## 2021-09-27 ENCOUNTER — Telehealth: Payer: Self-pay

## 2021-09-27 ENCOUNTER — Other Ambulatory Visit: Payer: Self-pay

## 2021-09-27 NOTE — Telephone Encounter (Signed)
[name] son of patient called asking if he could be called and placed on speaker phone during patients appt since [place] is not patient's first language and if not would Dr. Jerilee Hoh call him to inform him of the office visit.

## 2021-09-27 NOTE — Telephone Encounter (Signed)
Appointment 09/28/21

## 2021-09-28 ENCOUNTER — Ambulatory Visit (INDEPENDENT_AMBULATORY_CARE_PROVIDER_SITE_OTHER): Payer: PPO | Admitting: Internal Medicine

## 2021-09-28 ENCOUNTER — Encounter: Payer: Self-pay | Admitting: Internal Medicine

## 2021-09-28 VITALS — BP 128/60 | Temp 98.2°F | Wt 79.1 lb

## 2021-09-28 DIAGNOSIS — E785 Hyperlipidemia, unspecified: Secondary | ICD-10-CM | POA: Diagnosis not present

## 2021-09-28 DIAGNOSIS — I6523 Occlusion and stenosis of bilateral carotid arteries: Secondary | ICD-10-CM | POA: Diagnosis not present

## 2021-09-28 DIAGNOSIS — E119 Type 2 diabetes mellitus without complications: Secondary | ICD-10-CM | POA: Diagnosis not present

## 2021-09-28 DIAGNOSIS — Z23 Encounter for immunization: Secondary | ICD-10-CM

## 2021-09-28 DIAGNOSIS — Z720 Tobacco use: Secondary | ICD-10-CM

## 2021-09-28 DIAGNOSIS — I10 Essential (primary) hypertension: Secondary | ICD-10-CM | POA: Diagnosis not present

## 2021-09-28 DIAGNOSIS — I700 Atherosclerosis of aorta: Secondary | ICD-10-CM | POA: Diagnosis not present

## 2021-09-28 LAB — POCT GLYCOSYLATED HEMOGLOBIN (HGB A1C): Hemoglobin A1C: 5.8 % — AB (ref 4.0–5.6)

## 2021-09-28 NOTE — Patient Instructions (Signed)
-  Nice seeing you today!!  -Flu vaccine today.  -Remember your COVID booster at the pharmacy.  -Remember to be consistent in taking your medication.

## 2021-09-28 NOTE — Progress Notes (Signed)
Established Patient Office Visit     This visit occurred during the SARS-CoV-2 public health emergency.  Safety protocols were in place, including screening questions prior to the visit, additional usage of staff PPE, and extensive cleaning of exam room while observing appropriate contact time as indicated for disinfecting solutions.    CC/Reason for Visit: 45-monthfollow-up chronic medical conditions  HPI: MGaylene Moylanis a 87y.o. female who is coming in today for the above mentioned reasons. Past Medical History is significant for: hypertension, type 2 diabetes, mild to moderate Alzheimer's dementia, ongoing nicotine dependence of about a pack and half a day for her entire life, coronary artery disease, aortic atherosclerosis and severe protein caloric malnutrition.  Her weight has been stable at around 78-79 pounds.  She tells me she has decreased her smoking from a pack and 1/2 to 1 pack a day.  She is requesting her flu vaccine today.  As usual I have contacted her son JClair Gullingvia phone who lives out of state.  He tells me that she misses about 10 doses of medications and any 14-day period despite an automatic pill dispenser that he has installed.  She herself has no concerns or complaints.   Past Medical/Surgical History: Past Medical History:  Diagnosis Date   At risk for medication noncompliance    CAD (coronary artery disease)    a. borderline disease by CT 08/2018.   Diabetes mellitus, type 2 ([place]    History of cervical cancer    Hyperlipidemia    Hypertension    Mitral regurgitation    severe MR by echo 10/2020 // TEE 1/22: EF 60-65, normal RVSF, mild LAE, mod MR   Noncompliance with medication treatment due to underuse of medication    Raynaud's phenomenon    TN (trigeminal neuralgia)     Past Surgical History:  Procedure Laterality Date   BREAST BIOPSY     BREAST ENHANCEMENT SURGERY     CHOLECYSTECTOMY N/A 02/14/2014   Procedure: LAPAROSCOPIC  CHOLECYSTECTOMY;  Surgeon: ARalene Ok MD;  [facility]  Service: General;  Laterality: N/A;   ERCP N/A 03/08/2015   Procedure: ENDOSCOPIC RETROGRADE CHOLANGIOPANCREATOGRAPHY (ERCP);  Surgeon: MLadene Artist MD;  [facility];  Service: Endoscopy;  Laterality: N/A;   MASS EXCISION Right 12/25/2016   Procedure: EXCISION RIGHT SCALP MASS SEBACEOUS CYST;  Surgeon: DCoralie Keens MD;  [facility]  Service: General;  Laterality: Right;   TEE WITHOUT CARDIOVERSION N/A 12/12/2020   Procedure: TRANSESOPHAGEAL ECHOCARDIOGRAM (TEE);  Surgeon: TSueanne Margarita MD;  [facility];  Service: Cardiovascular;  Laterality: N/A;   [place]  for cervical cancer in situ    Social History:  reports that she has been smoking cigarettes. She has a 90.00 pack-year smoking history. She has never used smokeless tobacco. She reports that she does not drink alcohol and does not use drugs.  Allergies: Allergies  Allergen Reactions   Shrimp (Diagnostic) Itching   Morphine And Related Itching    Short lived, mild itching.    Family History:  Family History  Problem Relation Age of Onset   CAD Neg Hx    Diabetes Mellitus II Neg Hx      Current Outpatient Medications:    amLODipine (NORVASC) 5 MG tablet, TAKE ONE TABLET BY MOUTH ONCE DAILY (AM), Disp: 90 tablet, Rfl: 1   ASPIRIN LOW DOSE 81 MG EC tablet, TAKE ONE TABLET BY MOUTH DAILY AT BEDTIME., Disp:  90 tablet, Rfl: 1   atorvastatin (LIPITOR) 80 MG tablet, TAKE ONE TABLET BY MOUTH ONCE DAILY (BEDTIME), Disp: 90 tablet, Rfl: 1   Blood Glucose Monitoring Suppl (ONE TOUCH ULTRA MINI) w/Device KIT, USE TO CHECK BLOOD SUGAR DAILY AND AS NEEDED, Disp: 1 each, Rfl: 0   cholecalciferol (VITAMIN D3) 25 MCG (1000 UNIT) tablet, Take 1,000 Units by mouth in the morning and at bedtime., Disp: , Rfl:    donepezil (ARICEPT) 10 MG tablet, Take 1 tablet (10 mg total) by mouth at bedtime., Disp: 90 tablet, Rfl: 3    glucose blood (ACCU-CHEK GUIDE) test strip, Use as instructed, Disp: 100 each, Rfl: 12   isosorbide mononitrate (IMDUR) 120 MG 24 hr tablet, TAKE 1 TABLET (120 MG TOTAL) BY MOUTH DAILY., Disp: 90 tablet, Rfl: 2   Lancets (ONETOUCH ULTRASOFT) lancets, Once daily or PRN, Dx E11.9, Disp: 100 each, Rfl: 12   lisinopril (ZESTRIL) 20 MG tablet, TAKE ONE TABLET BY MOUTH ONCE DAILY (AM), Disp: 90 tablet, Rfl: 1   metFORMIN (GLUCOPHAGE) 1000 MG tablet, TAKE ONE TABLET BY MOUTH TWICE DAILY WITH A MEAL (AM AND BEDTIME), Disp: 180 tablet, Rfl: 1   mirtazapine (REMERON) 30 MG tablet, Take 1 tablet (30 mg total) by mouth at bedtime., Disp: 90 tablet, Rfl: 1   Multiple Vitamins-Minerals (ONE-DAILY MULTI-VIT/MINERAL) TABS, TAKE 1 TABLET BY MOUTH DAILY (AM), Disp: 90 tablet, Rfl: 1   nitroGLYCERIN (NITROSTAT) 0.4 MG SL tablet, PLACE 1 TABLET (0.4 MG TOTAL) UNDER THE TONGUE EVERY 5 (FIVE) MINUTES AS NEEDED FOR CHEST PAIN., Disp: 50 tablet, Rfl: 3   omeprazole (PRILOSEC) 20 MG capsule, TAKE 1 CAPSULE BY MOUTH 30 MINUTES BEFORE MORNING MEAL, Disp: 90 capsule, Rfl: 1   oxybutynin (DITROPAN) 5 MG tablet, TAKE ONE TABLET BY MOUTH TWICE DAILY (AM+BEDTIME), Disp: 180 tablet, Rfl: 1  Review of Systems:  Constitutional: Denies fever, chills, diaphoresis, appetite change and fatigue.  HEENT: Denies photophobia, eye pain, redness, hearing loss, ear pain, congestion, sore throat, rhinorrhea, sneezing, mouth sores, trouble swallowing, neck pain, neck stiffness and tinnitus.   Respiratory: Denies SOB, DOE, cough, chest tightness,  and wheezing.   Cardiovascular: Denies chest pain, palpitations and leg swelling.  Gastrointestinal: Denies nausea, vomiting, abdominal pain, diarrhea, constipation, blood in stool and abdominal distention.  Genitourinary: Denies dysuria, urgency, frequency, hematuria, flank pain and difficulty urinating.  Endocrine: Denies: hot or cold intolerance, sweats, changes in hair or nails, polyuria,  polydipsia. Musculoskeletal: Denies myalgias, back pain, joint swelling, arthralgias and gait problem.  Skin: Denies pallor, rash and wound.  Neurological: Denies dizziness, seizures, syncope, weakness, light-headedness, numbness and headaches.  Hematological: Denies adenopathy. Easy bruising, personal or family bleeding history  Psychiatric/Behavioral: Denies suicidal ideation, mood changes, confusion, nervousness, sleep disturbance and agitation    Physical Exam: Vitals:   09/28/21 1128  BP: 128/60  Temp: 98.2 F (36.8 C)  TempSrc: Oral  Weight: 79 lb 1.6 oz (35.9 kg)    Body mass index is 15.45 kg/m.   Constitutional: NAD, calm, comfortable Eyes: PERRL, lids and conjunctivae normal ENMT: Mucous membranes are moist.  Respiratory: clear to auscultation bilaterally, no wheezing, no crackles. Normal respiratory effort. No accessory muscle use.  Cardiovascular: Regular rate and rhythm, no murmurs / rubs / gallops. No extremity edema.  Neurologic: Grossly intact and nonfocal Psychiatric:  Alert and oriented x 3. Normal mood.    Impression and Plan:  Diabetes mellitus type 2 in nonobese ([place])  - Plan: POCT glycosylated hemoglobin (Hb A1C) -A1c remains stable  at 5.8.  Needs flu shot  - Plan: Flu Vaccine QUAD High Dose(Fluad)  Tobacco abuse -She has decreased from a pack and 1/2 to 1 pack a day, she is not interested in complete smoking cessation at this time.  Dyslipidemia -Continue statin, last lipid panel in August 2022 with a total cholesterol of 158, triglycerides 112 and LDL 75  Essential hypertension -Blood pressure is well controlled.  Bilateral carotid artery stenosis Aortic atherosclerosis (HCC) -Noted, on statin.  Time spent: 34 minutes reviewing chart, interviewing patient and son via phone, examining patient and formulating plan of care.   Patient Instructions  -Nice seeing you today!!  -Flu vaccine today.  -Remember your COVID booster at the  pharmacy.  -Remember to be consistent in taking your medication.     Lelon Frohlich, MD [facility]

## 2021-10-02 ENCOUNTER — Telehealth: Payer: Self-pay | Admitting: Pharmacist

## 2021-10-02 NOTE — Chronic Care Management (AMB) (Signed)
Chronic Care Management Pharmacy Assistant   Name: [name]  MRN: [id number] DOB: 09-Aug-1934  Reason for Encounter: General Assessment Call    Conditions to be addressed/monitored: HTN  Recent office visits:  09/28/21 Isaac Bliss, Rayford Halsted, MD - Patient presents for Diabetes type 2 and other concerns. No medication changes.  Recent consult visits:  09/18/21 Cameron Sprang, MD (Neurology) - Patient presented for Mild dementia without behavioral disturbance and other concerns. No medication changes.  Hospital visits:  None in previous 6 months  Medications: Outpatient Encounter Medications as of 10/02/2021  Medication Sig   amLODipine (NORVASC) 5 MG tablet TAKE ONE TABLET BY MOUTH ONCE DAILY (AM)   ASPIRIN LOW DOSE 81 MG EC tablet TAKE ONE TABLET BY MOUTH DAILY AT BEDTIME.   atorvastatin (LIPITOR) 80 MG tablet TAKE ONE TABLET BY MOUTH ONCE DAILY (BEDTIME)   Blood Glucose Monitoring Suppl (ONE TOUCH ULTRA MINI) w/Device KIT USE TO CHECK BLOOD SUGAR DAILY AND AS NEEDED   cholecalciferol (VITAMIN D3) 25 MCG (1000 UNIT) tablet Take 1,000 Units by mouth in the morning and at bedtime.   donepezil (ARICEPT) 10 MG tablet Take 1 tablet (10 mg total) by mouth at bedtime.   glucose blood (ACCU-CHEK GUIDE) test strip Use as instructed   isosorbide mononitrate (IMDUR) 120 MG 24 hr tablet TAKE 1 TABLET (120 MG TOTAL) BY MOUTH DAILY.   Lancets (ONETOUCH ULTRASOFT) lancets Once daily or PRN, Dx E11.9   lisinopril (ZESTRIL) 20 MG tablet TAKE ONE TABLET BY MOUTH ONCE DAILY (AM)   metFORMIN (GLUCOPHAGE) 1000 MG tablet TAKE ONE TABLET BY MOUTH TWICE DAILY WITH A MEAL (AM AND BEDTIME)   mirtazapine (REMERON) 30 MG tablet Take 1 tablet (30 mg total) by mouth at bedtime.   Multiple Vitamins-Minerals (ONE-DAILY MULTI-VIT/MINERAL) TABS TAKE 1 TABLET BY MOUTH DAILY (AM)   nitroGLYCERIN (NITROSTAT) 0.4 MG SL tablet PLACE 1 TABLET (0.4 MG TOTAL) UNDER THE TONGUE EVERY 5 (FIVE) MINUTES AS  NEEDED FOR CHEST PAIN.   omeprazole (PRILOSEC) 20 MG capsule TAKE 1 CAPSULE BY MOUTH 30 MINUTES BEFORE MORNING MEAL   oxybutynin (DITROPAN) 5 MG tablet TAKE ONE TABLET BY MOUTH TWICE DAILY (AM+BEDTIME)   No facility-administered encounter medications on file as of 10/02/2021.  Reviewed chart prior to disease state call. Spoke with patient regarding BP  Recent Office Vitals: BP Readings from Last 3 Encounters:  09/28/21 128/60  09/18/21 (!) 132/54  06/28/21 130/70   Pulse Readings from Last 3 Encounters:  09/18/21 (!) 108  06/28/21 74  01/11/21 (!) 57    Wt Readings from Last 3 Encounters:  09/28/21 79 lb 1.6 oz (35.9 kg)  09/18/21 77 lb 9.6 oz (35.2 kg)  06/28/21 77 lb 3.2 oz (35 kg)     Kidney Function Lab Results  Component Value Date/Time   CREATININE 0.97 06/28/2021 02:15 PM   CREATININE 1.10 (H) 12/08/2020 01:33 PM   CREATININE 0.88 11/24/2017 12:37 PM   GFR 53.31 (L) 06/28/2021 02:15 PM   GFRNONAA 46 (L) 12/08/2020 01:33 PM   GFRAA 53 (L) 12/08/2020 01:33 PM    BMP Latest Ref Rng & Units 06/28/2021 12/08/2020 04/25/2020  Glucose 70 - 99 mg/dL 90 164(H) 91  BUN 6 - 23 mg/dL '20 21 16  ' Creatinine 0.40 - 1.20 mg/dL 0.97 1.10(H) 0.85  BUN/Creat Ratio 12 - 28 - 19 -  Sodium 135 - 145 mEq/L 142 146(H) 142  Potassium 3.5 - 5.1 mEq/L 4.1 4.4 4.0  Chloride 96 - 112  mEq/L 105 108(H) 106  CO2 19 - 32 mEq/L '30 22 26  ' Calcium 8.4 - 10.5 mg/dL 9.8 9.6 9.9    Current antihypertensive regimen:  Lisinopril 20 mg 1 tablet daily Amlodipine 5 mg 1 tablet daily   Care Gaps: Eye exam - Overdue Foot Exam - Overdue Bp - 128/60 (11/4/)22) CCM- 1/23 AWV- office available to schedule Lab Results  Component Value Date   HGBA1C 5.8 (A) 09/28/2021    Star Rating Drugs: Metformin (Glucophage) 1000 mg - Last filled 09/12/2021 30 DS at [place] Lisinopril (Zestril) 20 mg - Last filled 09/12/2021 30 DS at [place] Atorvastatin (Lipitor) 80 mg - Last filled 09/12/2021 30 DS  at [place]  3rd attempt to reach   [place] [phone]

## 2021-11-10 ENCOUNTER — Other Ambulatory Visit: Payer: Self-pay | Admitting: Neurology

## 2021-12-03 ENCOUNTER — Ambulatory Visit (INDEPENDENT_AMBULATORY_CARE_PROVIDER_SITE_OTHER): Payer: PPO | Admitting: Family Medicine

## 2021-12-03 ENCOUNTER — Encounter: Payer: Self-pay | Admitting: Family Medicine

## 2021-12-03 VITALS — BP 90/60 | Temp 98.6°F | Ht <= 58 in | Wt 79.4 lb

## 2021-12-03 DIAGNOSIS — E861 Hypovolemia: Secondary | ICD-10-CM

## 2021-12-03 DIAGNOSIS — I9589 Other hypotension: Secondary | ICD-10-CM

## 2021-12-03 DIAGNOSIS — R42 Dizziness and giddiness: Secondary | ICD-10-CM | POA: Diagnosis not present

## 2021-12-03 DIAGNOSIS — F03A18 Unspecified dementia, mild, with other behavioral disturbance: Secondary | ICD-10-CM

## 2021-12-03 DIAGNOSIS — E119 Type 2 diabetes mellitus without complications: Secondary | ICD-10-CM | POA: Diagnosis not present

## 2021-12-03 LAB — POCT URINALYSIS DIPSTICK
Bilirubin, UA: NEGATIVE
Blood, UA: NEGATIVE
Glucose, UA: NEGATIVE
Ketones, UA: NEGATIVE
Leukocytes, UA: NEGATIVE
Nitrite, UA: NEGATIVE
Protein, UA: POSITIVE — AB
Spec Grav, UA: 1.015 (ref 1.010–1.025)
Urobilinogen, UA: 0.2 E.U./dL
pH, UA: 6 (ref 5.0–8.0)

## 2021-12-03 LAB — CBC WITH DIFFERENTIAL/PLATELET
Basophils Absolute: 0.1 10*3/uL (ref 0.0–0.1)
Basophils Relative: 1.1 % (ref 0.0–3.0)
Eosinophils Absolute: 0.1 10*3/uL (ref 0.0–0.7)
Eosinophils Relative: 0.9 % (ref 0.0–5.0)
HCT: 42.2 % (ref 36.0–46.0)
Hemoglobin: 13.9 g/dL (ref 12.0–15.0)
Lymphocytes Relative: 23.6 % (ref 12.0–46.0)
Lymphs Abs: 1.4 10*3/uL (ref 0.7–4.0)
MCHC: 32.9 g/dL (ref 30.0–36.0)
MCV: 97.3 fl (ref 78.0–100.0)
Monocytes Absolute: 0.3 10*3/uL (ref 0.1–1.0)
Monocytes Relative: 5.4 % (ref 3.0–12.0)
Neutro Abs: 4.2 10*3/uL (ref 1.4–7.7)
Neutrophils Relative %: 69 % (ref 43.0–77.0)
Platelets: 230 10*3/uL (ref 150.0–400.0)
RBC: 4.33 Mil/uL (ref 3.87–5.11)
RDW: 13.7 % (ref 11.5–15.5)
WBC: 6.1 10*3/uL (ref 4.0–10.5)

## 2021-12-03 LAB — BASIC METABOLIC PANEL
BUN: 18 mg/dL (ref 6–23)
CO2: 26 mEq/L (ref 19–32)
Calcium: 9.2 mg/dL (ref 8.4–10.5)
Chloride: 105 mEq/L (ref 96–112)
Creatinine, Ser: 0.99 mg/dL (ref 0.40–1.20)
GFR: 51.86 mL/min — ABNORMAL LOW (ref >60.00)
Glucose, Bld: 144 mg/dL — ABNORMAL HIGH (ref 70–99)
Potassium: 3.8 mEq/L (ref 3.5–5.1)
Sodium: 140 mEq/L (ref 135–145)

## 2021-12-03 LAB — T4, FREE: Free T4: 0.94 ng/dL (ref 0.60–1.60)

## 2021-12-03 LAB — FOLATE: Folate: 10.7 ng/mL (ref >5.9)

## 2021-12-03 LAB — VITAMIN B12: Vitamin B-12: 356 pg/mL (ref 211–911)

## 2021-12-03 LAB — TSH: TSH: 1.82 u[IU]/mL (ref 0.35–5.50)

## 2021-12-03 NOTE — Progress Notes (Signed)
Subjective:    Patient ID: [name], female    DOB: 1934-10-04, 88 y.o.   MRN: [id number]  Chief Complaint  Patient presents with   Acute Visit    UTI possible/ dizziness, erratic behavior  Patient accompanied by her son [name].  HPI Patient is an 88 year old female with past medical history significant for DM 2, history of cervical cancer, HLD, HTN, MR, dementia who was seen for acute concern.  Pt's sonconcern about changes in patient's behavior and dizziness.  Patient suspects a family friend is taking items from her home when they come to check on her during the week.  Pt son believes the items are just misplaced by pt.   Was advised patient may have a UTI by a friend.  Patient without concern.  States drinks coffee throughout the day and little to no water.  Has more of an appetite when eating with others.  Will eat lunch and dinner.  Followed by neurology for history of dementia.  Currently on Aricept 10 mg daily.  Several patient's medications were stopped as thought to be confusing to patient.  Patient currently taking aspirin 81 mg, donepezil 10 mg, Remeron 30 mg daily.  Using a automated pill dispenser that beats when it is time to take medication.  Patient given water in clinic to provide urine sample.  Past Medical History:  Diagnosis Date   At risk for medication noncompliance    CAD (coronary artery disease)    a. borderline disease by CT 08/2018.   Diabetes mellitus, type 2 ([place])    History of cervical cancer    Hyperlipidemia    Hypertension    Mitral regurgitation    severe MR by echo 10/2020 // TEE 1/22: EF 60-65, normal RVSF, mild LAE, mod MR   Noncompliance with medication treatment due to underuse of medication    Raynaud's phenomenon    TN (trigeminal neuralgia)     Allergies  Allergen Reactions   Shrimp (Diagnostic) Itching   Morphine And Related Itching    Short lived, mild itching.    ROS Unable to assess 2/2 patient's history of  dementia  Objective:    Blood pressure 90/60, temperature 98.6 F (37 C), temperature source Oral, height 4\' 8"  (1.422 m), weight 79 lb 6.4 oz (36 kg).  Gen. Pleasantly demented, thin, in no distress, normal affect   HEENT: [place]/AT, face symmetric, conjunctiva clear, no scleral icterus, PERRLA, EOMI, nares patent without drainage. Neck: No JVD, no thyromegaly, no carotid bruits Lungs: no accessory muscle use, CTAB, no wheezes or rales Cardiovascular: RRR, no m/r/g, no peripheral edema Abdomen: BS present, soft, NT/ND, no hepatosplenomegaly Musculoskeletal: No deformities, no cyanosis or clubbing, normal tone Neuro:  A&Ox3, CN II-XII intact, normal gait Skin:  Warm, no lesions/ rash   Wt Readings from Last 3 Encounters:  12/03/21 79 lb 6.4 oz (36 kg)  09/28/21 79 lb 1.6 oz (35.9 kg)  09/18/21 77 lb 9.6 oz (35.2 kg)    Lab Results  Component Value Date   WBC 6.0 06/28/2021   HGB 12.7 06/28/2021   HCT 38.2 06/28/2021   PLT 241.0 06/28/2021   GLUCOSE 90 06/28/2021   CHOL 158 06/28/2021   TRIG 112.0 06/28/2021   HDL 60.20 06/28/2021   LDLDIRECT 83.0 12/21/2014   LDLCALC 75 06/28/2021   ALT 14 06/28/2021   AST 20 06/28/2021   NA 142 06/28/2021   K 4.1 06/28/2021   CL 105 06/28/2021   CREATININE 0.97 06/28/2021   BUN  20 06/28/2021   CO2 30 06/28/2021   TSH 1.38 06/28/2021   INR 0.90 03/24/2018   HGBA1C 5.8 (A) 09/28/2021   MICROALBUR 1.1 03/18/2016    Assessment/Plan:  Dizziness  -Likely 2/2 decreased p.o. intake of water.  Also consider UTI -pt encouraged to decrease coffee intake daily -Use caution when changing positions -Blood pressure medications and oxybutynin discontinued given current dizziness, and hypotension. -UA with protein. No signs of acute infection. - Plan: POCT urinalysis dipstick, Basic metabolic panel, TSH, T4, Free  Mild dementia with other behavioral disturbance, unspecified dementia type -Continue donepezil 10 mg daily and Remeron 30 mg  nightly -Discussed the importance of a routine and supervision -Consider HH -Encouraged to schedule sooner follow-up with neurology - Plan: POCT urinalysis dipstick, Basic metabolic panel, CBC with Differential/Platelet, TSH, T4, Free, Vitamin B12, Folate  Hypotension due to hypovolemia -BP medications discontinued as patient no longer taking.  Meds include Norvasc 5 mg, lisinopril 20 mg, Imdur 120 mg.  Also discontinued oxybutynin as may increase confusion/falls. - Plan: POCT urinalysis dipstick, Basic metabolic panel  Diabetes type 2 in nonobese (HCC) -Controlled -Hemoglobin A1c 5.8% on 09/28/2021 -Lifestyle modifications -Discontinue glucometer supplies at request of patient's son as patient is not checking her blood sugar at home.  F/u as needed in the next month with PCP  More than 50% of over 35 minutes spent in total in caring for this patient was spent face-to-face, reviewing the chart, counseling and/or coordinating care.   Grier Mitts, MD

## 2021-12-14 ENCOUNTER — Telehealth: Payer: Self-pay | Admitting: Pharmacist

## 2021-12-14 NOTE — Chronic Care Management (AMB) (Signed)
° ° °  Chronic Care Management Pharmacy Assistant   Name: [name]  MRN: [id number] DOB: 05-29-1934  12/18/2021 APPOINTMENT [place], No answer,unable to leave message of appointment on 12/18/2021 at 4:00 via telephone visit with Jeni Salles, Pharm D. Cell, voicemail not set up and Home is disconnected.  Care Gaps: AWV - message sent to Ramond Craver Last BP - 90/60 on 12/03/2021 Last A1C - 5.8 on 09/28/2021 Eye  exam - overdue [place] - overdue Foot exam - overdue  Star Rating Drug: Metformin 1000 mg - Last filled 09/12/2021 30 DS at [place] Lisinopril 20 mg - Last filled 09/12/2021 30 DS at [place] Atorvastatin  80 mg - Last filled 09/12/2021 30 DS at [place]  Any gaps in medications fill history? Yes one day gap  [place] Pharmacist Assistant [phone]

## 2021-12-18 ENCOUNTER — Telehealth: Payer: Self-pay | Admitting: Pharmacist

## 2021-12-18 ENCOUNTER — Telehealth: Payer: PPO

## 2021-12-18 NOTE — Progress Notes (Deleted)
Chronic Care Management Pharmacy Note  12/18/2021 Name:  [name] MRN:  [id number] DOB:  1934/11/22  Summary: BP not at goal < 140/90 per office readings and does not check at home Pt is struggling with compliance with current medication regimen  Recommendations/Changes made from today's visit: -Recommended moving administration of medication times to with food (10 am and 7pm) to improve compliance -Recommended weekly monitoring of BP at home -Recommended decreasing omeprazole to every other day for one month and then stopping -Recommended DEXA scan -Recommended switching oxybutynin to XL for once daily administration  Plan: BP assessment in 2 months Follow up in 6 months   Subjective: [name] is an 88 y.o. year old female who is a primary patient of [name], Tara Halsted, MD.  The CCM team was consulted for assistance with disease management and care coordination needs.    Engaged with patient by telephone for follow up visit in response to provider referral for pharmacy case management and/or care coordination services.   Consent to Services:  The patient was given information about Chronic Care Management services, agreed to services, and gave verbal consent prior to initiation of services.  Please see initial visit note for detailed documentation.   Patient Care Team: [name], Tara Halsted, MD as PCP - General (Internal Medicine) Tara Margarita, MD as PCP - Cardiology (Cardiology) Tara Sprang, MD as Consulting Physician (Neurology) [name], [facility] as Pharmacist (Pharmacist)  Recent office visits: 12/03/21 Tara Mitts, MD: Patient presented for dizziness. Blood sugar elevated at 144 at time of blood draw. Protein noted in urine. Increase p.o. intake of water.  09/28/21 [name], Tara Halsted, MD - Patient presents for Diabetes type 2 and other concerns. No medication changes.  06-28-2021 [name], Tara Halsted,  MD - Patient presented for Diabetes mellitus type 2 in nonobese . Changed mirtazapine to 30 mg daily.  Recent consult visits: 09/18/21 Tara Sprang, MD (Neurology) - Patient presented for Mild dementia without behavioral disturbance and other concerns. No medication changes.  Hospital visits: None in previous 6 months   Objective:  Lab Results  Component Value Date   CREATININE 0.99 12/03/2021   BUN 18 12/03/2021   GFR 51.86 (L) 12/03/2021   GFRNONAA 46 (L) 12/08/2020   GFRAA 53 (L) 12/08/2020   NA 140 12/03/2021   K 3.8 12/03/2021   CALCIUM 9.2 12/03/2021   CO2 26 12/03/2021   GLUCOSE 144 (H) 12/03/2021    Lab Results  Component Value Date/Time   HGBA1C 5.8 (A) 09/28/2021 11:32 AM   HGBA1C 5.5 06/28/2021 02:00 PM   HGBA1C 7.7 (H) 02/04/2017 02:39 PM   HGBA1C 7.8 (H) 11/12/2016 04:00 PM   GFR 51.86 (L) 12/03/2021 02:42 PM   GFR 53.31 (L) 06/28/2021 02:15 PM   MICROALBUR 1.1 03/18/2016 04:56 PM   MICROALBUR <0.7 12/21/2014 10:40 AM    Last diabetic Eye exam:  Lab Results  Component Value Date/Time   HMDIABEYEEXA normal 07/16/2010 12:00 AM    Last diabetic Foot exam:  Lab Results  Component Value Date/Time   HMDIABFOOTEX done 10/25/2011 12:00 AM     Lab Results  Component Value Date   CHOL 158 06/28/2021   HDL 60.20 06/28/2021   LDLCALC 75 06/28/2021   LDLDIRECT 83.0 12/21/2014   TRIG 112.0 06/28/2021   CHOLHDL 3 06/28/2021    Hepatic Function Latest Ref Rng & Units 06/28/2021 04/25/2020 07/19/2019  Total Protein 6.0 - 8.3 g/dL 6.3 6.6 6.0  Albumin  3.5 - 5.2 g/dL 4.0 4.3 4.0  AST 0 - 37 U/L _0 ALT 0 - 35 U/L _1 Alk Phosphatase 39 - 117 U/L 72 74 78  Total Bilirubin 0.2 - 1.2 mg/dL 0.5 0.6 0.5  Bilirubin, Direct 0.00 - 0.40 mg/dL - - 0.16    Lab Results  Component Value Date/Time   TSH 1.82 12/03/2021 02:42 PM   TSH 1.38 06/28/2021 02:15 PM   FREET4 0.94 12/03/2021 02:42 PM    CBC Latest Ref Rng & Units 12/03/2021 06/28/2021 04/25/2020   WBC 4.0 - 10.5 K/uL 6.1 6.0 7.9  Hemoglobin 12.0 - 15.0 g/dL 13.9 12.7 15.4(H)  Hematocrit 36.0 - 46.0 % 42.2 38.2 45.8  Platelets 150.0 - 400.0 K/uL 230.0 241.0 241.0    Lab Results  Component Value Date/Time   VD25OH 73.40 06/28/2021 02:15 PM   VD25OH 45.03 04/25/2020 08:29 AM    Clinical ASCVD: Yes  The ASCVD Risk score (Arnett DK, et al., 2019) failed to calculate for the following reasons:   The 2019 ASCVD risk score is only valid for ages 11 to 31    Depression screen PHQ 2/9 12/03/2021 06/28/2021 04/25/2020  Decreased Interest 0 0 0  Down, Depressed, Hopeless 0 0 1  PHQ - 2 Score 0 0 1  Altered sleeping - 0 0  Tired, decreased energy - 1 2  Change in appetite - 2 2  Feeling bad or failure about yourself  - 0 0  Trouble concentrating - 0 0  Moving slowly or fidgety/restless - 0 0  Suicidal thoughts - 0 0  PHQ-9 Score - 3 5  Difficult doing work/chores - Not difficult at all Not difficult at all  Some recent data might be hidden     Social History   Tobacco Use  Smoking Status Every Day   Packs/day: 1.50   Years: 60.00   Pack years: 90.00   Types: Cigarettes  Smokeless Tobacco Never   BP Readings from Last 3 Encounters:  12/03/21 90/60  09/28/21 128/60  09/18/21 (!) 132/54   Pulse Readings from Last 3 Encounters:  09/18/21 (!) 108  06/28/21 74  01/11/21 (!) 57   Wt Readings from Last 3 Encounters:  12/03/21 79 lb 6.4 oz (36 kg)  09/28/21 79 lb 1.6 oz (35.9 kg)  09/18/21 77 lb 9.6 oz (35.2 kg)   BMI Readings from Last 3 Encounters:  12/03/21 17.80 kg/m  09/28/21 15.45 kg/m  09/18/21 15.16 kg/m    Assessment/Interventions: Review of patient past medical history, allergies, medications, health status, including review of consultants reports, laboratory and other test data, was performed as part of comprehensive evaluation and provision of chronic care management services.   SDOH:  (Social Determinants of Health) assessments and interventions  performed: Yes   SDOH Screenings   Alcohol Screen: Not on file  Depression (PHQ2-9): Low Risk    PHQ-2 Score: 0  Financial Resource Strain: Low Risk    Difficulty of Paying Living Expenses: Not hard at all  Food Insecurity: Not on file  Housing: Not on file  Physical Activity: Not on file  Social Connections: Not on file  Stress: Not on file  Tobacco Use: High Risk   Smoking Tobacco Use: Every Day   Smokeless Tobacco Use: Never   Passive Exposure: Not on file  Transportation Needs: No Transportation Needs   Lack of Transportation (Medical): No   Lack of Transportation (Non-Medical): No   Patient lives alone but her  son and family friend stop in to check on her and help her with medication management. Her son is in communication with her frequently, despite not living there and he is on the call for the visit. She enjoys working in her garden 3-5 days a week and that is her form of exercise.  Patient is concerned about her decreased appetite and doesn't always take her medications as scheduled as she feels some of them aren't working. She does not know what all of them are for but feels like it's too many.   She continues to smoke knowing the effects on her health and smokes about 15 cigarettes per day, which she has cut back on significantly over the last few years.   Patient is using [facility] and they prepackage her medications. Her son or friend will remove it from the packaging and put it in a pill reminder which has an alarm that goes on at 6am and 6pm every day. Patient shuts off the alarm but doesn't always take what is inside the machine. She worries about not taking the medications on an empty stomach as she doesn't always eat first thing in the morning.  CCM Care Plan  Allergies  Allergen Reactions   Shrimp (Diagnostic) Itching   Morphine And Related Itching    Short lived, mild itching.    Medications Reviewed Today     Reviewed by Billie Ruddy, MD  (Physician) on 12/03/21 at 1740  Med List Status: <None>   Medication Order Taking? Sig Documenting Provider Last Dose Status Informant  ASPIRIN LOW DOSE 81 MG EC tablet [id number] Yes TAKE ONE TABLET BY MOUTH DAILY AT BEDTIME. [name], Tara Halsted, MD Taking Active   donepezil (ARICEPT) 10 MG tablet [id number] Yes TAKE 1 TABLET BY MOUTH AT BEDTIME Tara Sprang, MD Taking Active   mirtazapine (REMERON) 30 MG tablet [id number] Yes Take 1 tablet (30 mg total) by mouth at bedtime. [name], Tara Halsted, MD Taking Active   nitroGLYCERIN (NITROSTAT) 0.4 MG SL tablet [id number] Yes PLACE 1 TABLET (0.4 MG TOTAL) UNDER THE TONGUE EVERY 5 (FIVE) MINUTES AS NEEDED FOR CHEST PAIN. Richardson Dopp T, [place] Taking Active             Patient Active Problem List   Diagnosis Date Noted   Severe mitral regurgitation    Moderate mitral regurgitation 04/28/2018   Diabetes mellitus type 2 in nonobese ([place]) 03/25/2018   Dilated cbd, acquired 03/08/2015   Acute biliary pancreatitis 03/06/2015   Atypical chest pain 10/06/2013   Carotid artery disease ([place]) 12/13/2012   Tobacco abuse 02/20/2012   RAYNAUD'S SYNDROME 11/30/2009   TRIGEMINAL NEURALGIA 04/14/2008   Dyslipidemia 07/23/2007   Essential hypertension 07/23/2007   CERVICAL CANCER, HX OF 07/23/2007    Immunization History  Administered Date(s) Administered   Fluad Quad(high Dose 65+) 09/28/2021   Influenza Split 10/25/2011, 07/27/2013   Influenza Whole 10/11/2009, 10/08/2010   Influenza, High Dose Seasonal PF 11/17/2015, 08/15/2016, 08/26/2017, 10/21/2018   Influenza,inj,Quad PF,6+ Mos 10/09/2014   Influenza-Unspecified 12/08/2019   PFIZER(Purple Top)SARS-COV-2 Vaccination 11/26/2019, 12/27/2019, 09/19/2020   Pneumococcal Conjugate-13 12/21/2014   Pneumococcal Polysaccharide-23 04/02/2012, 11/13/2018    Conditions to be addressed/monitored:  Hypertension, Hyperlipidemia, Diabetes, Coronary Artery Disease, GERD, Overactive Bladder,  and Memory loss, Appetite loss  Conditions addressed this visit: ***  There are no care plans that you recently modified to display for this patient.    Medication Assistance: None required.  Patient affirms current coverage meets needs.  Compliance/Adherence/Medication fill history: Care Gaps: Shingrix, eye exam, foot exam, A1c, DEXA, COVID booster Last BP - 90/60 on 12/03/2021 Last A1C - 5.8 on 09/28/2021  Star-Rating Drugs: Metformin 1000 mg - Last filled 09/12/2021 30 DS at [facility] Lisinopril 20 mg - Last filled 09/12/2021 30 DS at [facility] Atorvastatin  80 mg - Last filled 09/12/2021 30 DS at [facility]  Patient's preferred pharmacy is:  [place], [place] - [address] [place] [address] Phone: [phone] Fax: [place], [address] [place] [address] Phone: [phone] Fax: [phone]   Uses pill box? Yes - uses pill tower and pill packaging Pt endorses  80% compliance  We discussed: Current pharmacy is preferred with insurance plan and patient is satisfied with pharmacy services Patient decided to: Continue current medication management strategy  Care Plan and Follow Up Patient Decision:  Patient agrees to Care Plan and Follow-up.  Plan: Telephone follow up appointment with care management team member scheduled for:  6 months  Jeni Salles, PharmD, [place] Pharmacist [place] at [place] [phone]

## 2021-12-18 NOTE — Telephone Encounter (Signed)
°  Chronic Care Management   Outreach Note  12/18/2021 Name: Lova Urbieta MRN: [id number] DOB: 11/23/1934  Referred by: Isaac Bliss, Rayford Halsted, MD  Patient had a phone appointment scheduled with clinical pharmacist today.  An unsuccessful telephone outreach was attempted today. The patient was referred to the pharmacist for assistance with care management and care coordination.   If possible, a message was left to return call to: [phone] or to [facility] at [facility]: Kenny Lake, PharmD, [place] Pharmacist [place] [facility] [place]

## 2021-12-19 ENCOUNTER — Telehealth: Payer: Self-pay | Admitting: Internal Medicine

## 2021-12-19 NOTE — Telephone Encounter (Signed)
Patient's son called to follow up on missed call from [facility] that he received yesterday to discuss patients medications.    Good callback number is [phone]    Please advise

## 2021-12-19 NOTE — Telephone Encounter (Signed)
Tried to call patient's son back but had to leave a voicemail. Requested a call back.

## 2021-12-20 NOTE — Telephone Encounter (Signed)
Patient son is calling to return Maddie's phone call. He informed me that a good time would be after 4:30pm today or early tomorrow morning.  Please advise.

## 2021-12-24 ENCOUNTER — Ambulatory Visit (INDEPENDENT_AMBULATORY_CARE_PROVIDER_SITE_OTHER): Payer: PPO | Admitting: Pharmacist

## 2021-12-24 DIAGNOSIS — R634 Abnormal weight loss: Secondary | ICD-10-CM

## 2021-12-24 DIAGNOSIS — I10 Essential (primary) hypertension: Secondary | ICD-10-CM

## 2021-12-24 NOTE — Progress Notes (Signed)
Chronic Care Management Pharmacy Note  12/24/2021 Name:  [name] MRN:  [id number] DOB:  12-Dec-1933  Summary: BP not at goal < 140/90 per office readings and does not check at home Pt is struggling with compliance with current medication regimen  Recommendations/Changes made from today's visit: -Requested refills for mirtazapine and vitamin D -Recommended weekly monitoring of BP at home  Plan: BP assessment in 3 months Follow up in 6 months   Subjective: [name] is an 88 y.o. year old female who is a primary patient of Isaac Bliss, Rayford Halsted, MD.  The CCM team was consulted for assistance with disease management and care coordination needs.    Engaged with patient by telephone for follow up visit in response to provider referral for pharmacy case management and/or care coordination services.   Consent to Services:  The patient was given information about Chronic Care Management services, agreed to services, and gave verbal consent prior to initiation of services.  Please see initial visit note for detailed documentation.   Patient Care Team: Isaac Bliss, Rayford Halsted, MD as PCP - General (Internal Medicine) Sueanne Margarita, MD as PCP - Cardiology (Cardiology) Cameron Sprang, MD as Consulting Physician (Neurology) Viona Gilmore, [facility] as Pharmacist (Pharmacist)  Recent office visits: 12/03/21 Grier Mitts, MD: Patient presented for dizziness. Blood sugar elevated at 144 at time of blood draw. Protein noted in urine. Increase p.o. intake of water.  09/28/21 Isaac Bliss, Rayford Halsted, MD - Patient presents for Diabetes type 2 and other concerns. No medication changes.  06-28-2021 Isaac Bliss, Rayford Halsted, MD - Patient presented for Diabetes mellitus type 2 in nonobese . Changed mirtazapine to 30 mg daily.  Recent consult visits: 09/18/21 Cameron Sprang, MD (Neurology) - Patient presented for Mild dementia without behavioral disturbance  and other concerns. No medication changes.  Hospital visits: None in previous 6 months   Objective:  Lab Results  Component Value Date   CREATININE 0.99 12/03/2021   BUN 18 12/03/2021   GFR 51.86 (L) 12/03/2021   GFRNONAA 46 (L) 12/08/2020   GFRAA 53 (L) 12/08/2020   NA 140 12/03/2021   K 3.8 12/03/2021   CALCIUM 9.2 12/03/2021   CO2 26 12/03/2021   GLUCOSE 144 (H) 12/03/2021    Lab Results  Component Value Date/Time   HGBA1C 5.8 (A) 09/28/2021 11:32 AM   HGBA1C 5.5 06/28/2021 02:00 PM   HGBA1C 7.7 (H) 02/04/2017 02:39 PM   HGBA1C 7.8 (H) 11/12/2016 04:00 PM   GFR 51.86 (L) 12/03/2021 02:42 PM   GFR 53.31 (L) 06/28/2021 02:15 PM   MICROALBUR 1.1 03/18/2016 04:56 PM   MICROALBUR <0.7 12/21/2014 10:40 AM    Last diabetic Eye exam:  Lab Results  Component Value Date/Time   HMDIABEYEEXA normal 07/16/2010 12:00 AM    Last diabetic Foot exam:  Lab Results  Component Value Date/Time   HMDIABFOOTEX done 10/25/2011 12:00 AM     Lab Results  Component Value Date   CHOL 158 06/28/2021   HDL 60.20 06/28/2021   LDLCALC 75 06/28/2021   LDLDIRECT 83.0 12/21/2014   TRIG 112.0 06/28/2021   CHOLHDL 3 06/28/2021    Hepatic Function Latest Ref Rng & Units 06/28/2021 04/25/2020 07/19/2019  Total Protein 6.0 - 8.3 g/dL 6.3 6.6 6.0  Albumin 3.5 - 5.2 g/dL 4.0 4.3 4.0  AST 0 - 37 U/L '20 21 16  ' ALT 0 - 35 U/L '14 14 11  ' Alk Phosphatase 39 - 117 U/L 72 74  78  Total Bilirubin 0.2 - 1.2 mg/dL 0.5 0.6 0.5  Bilirubin, Direct 0.00 - 0.40 mg/dL - - 0.16    Lab Results  Component Value Date/Time   TSH 1.82 12/03/2021 02:42 PM   TSH 1.38 06/28/2021 02:15 PM   FREET4 0.94 12/03/2021 02:42 PM    CBC Latest Ref Rng & Units 12/03/2021 06/28/2021 04/25/2020  WBC 4.0 - 10.5 K/uL 6.1 6.0 7.9  Hemoglobin 12.0 - 15.0 g/dL 13.9 12.7 15.4(H)  Hematocrit 36.0 - 46.0 % 42.2 38.2 45.8  Platelets 150.0 - 400.0 K/uL 230.0 241.0 241.0    Lab Results  Component Value Date/Time   VD25OH 73.40  06/28/2021 02:15 PM   VD25OH 45.03 04/25/2020 08:29 AM    Clinical ASCVD: Yes  The ASCVD Risk score (Arnett DK, et al., 2019) failed to calculate for the following reasons:   The 2019 ASCVD risk score is only valid for ages 43 to 49    Depression screen PHQ 2/9 12/03/2021 06/28/2021 04/25/2020  Decreased Interest 0 0 0  Down, Depressed, Hopeless 0 0 1  PHQ - 2 Score 0 0 1  Altered sleeping - 0 0  Tired, decreased energy - 1 2  Change in appetite - 2 2  Feeling bad or failure about yourself  - 0 0  Trouble concentrating - 0 0  Moving slowly or fidgety/restless - 0 0  Suicidal thoughts - 0 0  PHQ-9 Score - 3 5  Difficult doing work/chores - Not difficult at all Not difficult at all  Some recent data might be hidden     Social History   Tobacco Use  Smoking Status Every Day   Packs/day: 1.50   Years: 60.00   Pack years: 90.00   Types: Cigarettes  Smokeless Tobacco Never   BP Readings from Last 3 Encounters:  12/03/21 90/60  09/28/21 128/60  09/18/21 (!) 132/54   Pulse Readings from Last 3 Encounters:  09/18/21 (!) 108  06/28/21 74  01/11/21 (!) 57   Wt Readings from Last 3 Encounters:  12/03/21 79 lb 6.4 oz (36 kg)  09/28/21 79 lb 1.6 oz (35.9 kg)  09/18/21 77 lb 9.6 oz (35.2 kg)   BMI Readings from Last 3 Encounters:  12/03/21 17.80 kg/m  09/28/21 15.45 kg/m  09/18/21 15.16 kg/m    Assessment/Interventions: Review of patient past medical history, allergies, medications, health status, including review of consultants reports, laboratory and other test data, was performed as part of comprehensive evaluation and provision of chronic care management services.   SDOH:  (Social Determinants of Health) assessments and interventions performed: Yes   SDOH Screenings   Alcohol Screen: Not on file  Depression (PHQ2-9): Low Risk    PHQ-2 Score: 0  Financial Resource Strain: Low Risk    Difficulty of Paying Living Expenses: Not hard at all  Food Insecurity: Not on  file  Housing: Not on file  Physical Activity: Not on file  Social Connections: Not on file  Stress: Not on file  Tobacco Use: High Risk   Smoking Tobacco Use: Every Day   Smokeless Tobacco Use: Never   Passive Exposure: Not on file  Transportation Needs: No Transportation Needs   Lack of Transportation (Medical): No   Lack of Transportation (Non-Medical): No    CCM Care Plan  Allergies  Allergen Reactions   Shrimp (Diagnostic) Itching   Morphine And Related Itching    Short lived, mild itching.    Medications Reviewed Today     Reviewed by  Billie Ruddy, MD (Physician) on 12/03/21 at 1740  Med List Status: <None>   Medication Order Taking? Sig Documenting Provider Last Dose Status Informant  ASPIRIN LOW DOSE 81 MG EC tablet [id number] Yes TAKE ONE TABLET BY MOUTH DAILY AT BEDTIME. Isaac Bliss, Rayford Halsted, MD Taking Active   donepezil (ARICEPT) 10 MG tablet [id number] Yes TAKE 1 TABLET BY MOUTH AT BEDTIME Cameron Sprang, MD Taking Active   mirtazapine (REMERON) 30 MG tablet [id number] Yes Take 1 tablet (30 mg total) by mouth at bedtime. Isaac Bliss, Rayford Halsted, MD Taking Active   nitroGLYCERIN (NITROSTAT) 0.4 MG SL tablet [id number] Yes PLACE 1 TABLET (0.4 MG TOTAL) UNDER THE TONGUE EVERY 5 (FIVE) MINUTES AS NEEDED FOR CHEST PAIN. Richardson Dopp T, [place] Taking Active             Patient Active Problem List   Diagnosis Date Noted   Severe mitral regurgitation    Moderate mitral regurgitation 04/28/2018   Diabetes mellitus type 2 in nonobese ([place]) 03/25/2018   Dilated cbd, acquired 03/08/2015   Acute biliary pancreatitis 03/06/2015   Atypical chest pain 10/06/2013   Carotid artery disease ([place]) 12/13/2012   Tobacco abuse 02/20/2012   RAYNAUD'S SYNDROME 11/30/2009   TRIGEMINAL NEURALGIA 04/14/2008   Dyslipidemia 07/23/2007   Essential hypertension 07/23/2007   CERVICAL CANCER, HX OF 07/23/2007    Immunization History  Administered Date(s) Administered    Fluad Quad(high Dose 65+) 09/28/2021   Influenza Split 10/25/2011, 07/27/2013   Influenza Whole 10/11/2009, 10/08/2010   Influenza, High Dose Seasonal PF 11/17/2015, 08/15/2016, 08/26/2017, 10/21/2018   Influenza,inj,Quad PF,6+ Mos 10/09/2014   Influenza-Unspecified 12/08/2019   PFIZER(Purple Top)SARS-COV-2 Vaccination 11/26/2019, 12/27/2019, 09/19/2020   Pneumococcal Conjugate-13 12/21/2014   Pneumococcal Polysaccharide-23 04/02/2012, 11/13/2018    Conditions to be addressed/monitored:  Hypertension, Hyperlipidemia, Diabetes, Coronary Artery Disease, GERD, Overactive Bladder, and Memory loss, Appetite loss  Conditions addressed this visit: Hypertension, memory loss, appetite loss  Care Plan : [place]  Updates made by Viona Gilmore, [place] since 12/24/2021 12:00 AM     Problem: Problem: Hypertension, Hyperlipidemia, Diabetes, Coronary Artery Disease, GERD, Overactive Bladder, and Memory loss, Appetite loss      Long-Range Goal: Patient-Specific Goal   Start Date: 06/20/2021  Expected End Date: 06/20/2022  Recent Progress: On track  Priority: High  Note:   Current Barriers:  Unable to independently monitor therapeutic efficacy Does not adhere to prescribed medication regimen  Pharmacist Clinical Goal(s):  Patient will achieve adherence to monitoring guidelines and medication adherence to achieve therapeutic efficacy maintain control of blood pressure as evidenced by home blood pressure readings  achieve ability to self administer medications as prescribed through use of pill packing and alarms as evidenced by patient report through collaboration with PharmD and provider.   Interventions: 1:1 collaboration with Isaac Bliss, Rayford Halsted, MD regarding development and update of comprehensive plan of care as evidenced by provider attestation and co-signature Inter-disciplinary care team collaboration (see longitudinal plan of care) Comprehensive medication review  performed; medication list updated in electronic medical record  Hypertension   (BP goal <140/90) -Controlled -Current treatment: No medications -Medications previously tried: lisinopril, amlodipine (pt stopped taking) -Current home readings: does not check regularly (owns a cuff) -Current dietary habits: eating a lot of vegetables and rice -Current exercise habits: working on her garden -Denies hypotensive/hypertensive symptoms -Educated on BP goals and benefits of medications for prevention of heart attack, stroke and kidney damage; Exercise goal of 150 minutes per week;  Importance of home blood pressure monitoring; Proper BP monitoring technique; -Counseled to monitor BP at home at least weekly, document, and provide log at future appointments -Counseled on diet and exercise extensively Recommended to continue current medication Recommended starting home blood pressure monitoring  Hyperlipidemia: (LDL goal < 70) -Not ideally controlled -Current treatment: No medications -Medications previously tried: atorvastatin (pt stopped taking) -Current dietary patterns: rice and vegetables -Current exercise habits: working in garden -Educated on Cholesterol goals;  Benefits of statin for ASCVD risk reduction; Importance of limiting foods high in cholesterol; Exercise goal of 150 minutes per week; -Counseled on diet and exercise extensively Recommended to continue current medication Recommended repeat lipid panel.  CAD (Goal: prevent heart events) -Controlled -Current treatment  Aspirin 81 mg 1 tablet daily - Appropriate, Effective, Safe, Accessible Nitroglycerin 0.4 mg SL tablets as needed - Appropriate, Effective, Safe, Accessible -Medications previously tried: isosorbide mononitrate, atorvastatin -Recommended to continue current medication Counseled on avoidance of NSAIDs with current aspirin use  Diabetes (A1c goal <7%) -Controlled -Current medications: No  medications -Medications previously tried: metformin (pt stopped taking) -Current home glucose readings fasting glucose: not checking post prandial glucose: not checking -Denies hypoglycemic/hyperglycemic symptoms -Current meal patterns:  breakfast: cereal with milk or skipped lunch: rice and vegetables and meat  dinner: same as lunch snacks: no snacks drinks: orange juice (powdered without much sweetener); little water -Current exercise: working in the yard and sometimes goes to the gym -Educated on A1c and blood sugar goals; Benefits of routine self-monitoring of blood sugar; Carbohydrate counting and/or plate method -Counseled to check feet daily and get yearly eye exams -Counseled on diet and exercise extensively Recommended to continue current medication  GERD (Goal: minimize symptoms) -Controlled -Current treatment  None -Medications previously tried: omeprazole (pt stopped taking) -Educated on use of Tums for as needed heartburn  Overactive bladder (Goal: minimize symptoms) -Controlled -Current treatment  No medications -Medications previously tried: oxybutynin  -Counseled on diet and exercise extensively  Tobacco use (Goal quit smoking) -Uncontrolled -Previous quit attempts: gum; patches -Current treatment  No medications -Patient smokes Within 30 minutes of waking -Patient triggers include:  enjoyment -On a scale of 1-10, reports MOTIVATION to quit is 2 -On a scale of 1-10, reports CONFIDENCE in quitting is 2 -Provided contact information for [place] [facility]uit Line (1-800-QUIT-NOW) and encouraged patient to reach out to this group for support. -Counseled on benefits of smoking cessation such as increased appetite. Recommended DEXA scan due to smoking history.  Decreased appetite (Goal: increase appetite) -Not ideally controlled -Current treatment  Mirtazapine 30 mg 1 tablet at bedtime - Appropriate, Effective, Safe, Accessible -Medications previously tried: none  -  Requested refill.    Health Maintenance -Vaccine gaps: COVID booster (Declines), shingrix -Current therapy:  Vitamin D 2000 units daily Multivitamin daily -Educated on Cost vs benefit of each product must be carefully weighed by individual consumer -Patient is satisfied with current therapy and denies issues -Counseled on recommended amounts of calcium (1200 mg total) and vitamin D (1000 units) per day to support bone health. Recommended supplementation with calcium citrate 600 mg per day.  Patient Goals/Self-Care Activities Patient will:  - take medications as prescribed focus on medication adherence by use of pill tower and packaging check blood pressure weekly, document, and provide at future appointments target a minimum of 150 minutes of moderate intensity exercise weekly  Follow Up Plan: Telephone follow up appointment with care management team member scheduled for: 6 months       Medication Assistance:  None required.  Patient affirms current coverage meets needs.  Compliance/Adherence/Medication fill history: Care Gaps: Shingrix, eye exam, foot exam, A1c, DEXA, COVID booster Last BP - 90/60 on 12/03/2021 Last A1C - 5.8 on 09/28/2021  Star-Rating Drugs: Metformin 1000 mg - Last filled 09/12/2021 30 DS at [place] Lisinopril 20 mg - Last filled 09/12/2021 30 DS at [place] Atorvastatin  80 mg - Last filled 09/12/2021 30 DS at [place]  Patient's preferred pharmacy is:  [facility], [place] - [address] [place] [address] Phone: [phone] Fax: [place], [place] [address] Phone: [phone] Fax: [phone]   Uses pill box? Yes - uses pill tower and pill packaging Pt endorses  80% compliance  We discussed: Current pharmacy is preferred with insurance plan and patient is satisfied with  pharmacy services Patient decided to: Continue current medication management strategy  Care Plan and Follow Up Patient Decision:  Patient agrees to Care Plan and Follow-up.  Plan: Telephone follow up appointment with care management team member scheduled for:  6 months  Jeni Salles, PharmD, [place] Pharmacist [place] at [facility] [phone]

## 2021-12-25 DIAGNOSIS — E785 Hyperlipidemia, unspecified: Secondary | ICD-10-CM

## 2021-12-25 DIAGNOSIS — I10 Essential (primary) hypertension: Secondary | ICD-10-CM

## 2021-12-26 ENCOUNTER — Encounter: Payer: Self-pay | Admitting: Cardiology

## 2021-12-26 ENCOUNTER — Other Ambulatory Visit: Payer: Self-pay

## 2021-12-26 ENCOUNTER — Ambulatory Visit (HOSPITAL_COMMUNITY): Payer: PPO | Attending: Cardiology

## 2021-12-26 DIAGNOSIS — I340 Nonrheumatic mitral (valve) insufficiency: Secondary | ICD-10-CM | POA: Diagnosis present

## 2021-12-26 LAB — ECHOCARDIOGRAM COMPLETE
Area-P 1/2: 3.6 cm2
MV M vel: 7.36 m/s
MV Peak grad: 216.7 mmHg
MV VTI: 1.2 cm2
Radius: 0.7 cm
S' Lateral: 2.7 cm

## 2021-12-28 ENCOUNTER — Telehealth: Payer: Self-pay

## 2021-12-28 DIAGNOSIS — I340 Nonrheumatic mitral (valve) insufficiency: Secondary | ICD-10-CM

## 2021-12-28 NOTE — Telephone Encounter (Signed)
Tara Margarita, MD  12/26/2021 11:19 AM EST     Echo showed normal LV dimensions and LVF as well as normal strain.  There is very mild pulmonary HTN, mildly enlarged LA from MR and moderate to severe MR.  Since LV dimensions/LVF and LF strain are normal will continue to watch - please get repeat 2D echo in 6 months   The patient's son has been notified of the result and verbalized understanding.  All questions (if any) were answered. Antonieta Iba, RN 12/28/2021 2:44 PM

## 2021-12-28 NOTE — Telephone Encounter (Signed)
Error

## 2022-04-03 ENCOUNTER — Other Ambulatory Visit: Payer: Self-pay | Admitting: Internal Medicine

## 2022-04-03 DIAGNOSIS — E43 Unspecified severe protein-calorie malnutrition: Secondary | ICD-10-CM

## 2022-04-03 NOTE — Telephone Encounter (Signed)
Okay to refill Remeron 30 mg? ?

## 2022-04-30 ENCOUNTER — Other Ambulatory Visit: Payer: Self-pay | Admitting: Internal Medicine

## 2022-06-27 ENCOUNTER — Ambulatory Visit (HOSPITAL_COMMUNITY): Payer: PPO | Attending: Cardiology

## 2022-06-27 DIAGNOSIS — I340 Nonrheumatic mitral (valve) insufficiency: Secondary | ICD-10-CM | POA: Insufficient documentation

## 2022-06-27 LAB — ECHOCARDIOGRAM COMPLETE
Area-P 1/2: 5.79 cm2
MV M vel: 6.85 m/s
MV Peak grad: 187.7 mmHg
Radius: 0.6 cm
S' Lateral: 2.4 cm

## 2022-07-02 ENCOUNTER — Telehealth: Payer: Self-pay | Admitting: Pharmacist

## 2022-07-02 NOTE — Chronic Care Management (AMB) (Signed)
    Chronic Care Management Pharmacy Assistant   Name: [name]  MRN: [id number] DOB: [birth year]  Reason for Encounter: Disease State   Conditions to be addressed/monitored: HTN  Recent office visits:  None  Recent consult visits:  None  Hospital visits:  None in previous 6 months  Medications: Outpatient Encounter Medications as of 07/02/2022  Medication Sig   ASPIRIN LOW DOSE 81 MG tablet TAKE ONE TABLET BY MOUTH DAILY AT BEDTIME.   donepezil (ARICEPT) 10 MG tablet TAKE 1 TABLET BY MOUTH AT BEDTIME   mirtazapine (REMERON) 30 MG tablet TAKE 1 TABLET (30 MG TOTAL) BY MOUTH AT BEDTIME.   nitroGLYCERIN (NITROSTAT) 0.4 MG SL tablet PLACE 1 TABLET (0.4 MG TOTAL) UNDER THE TONGUE EVERY 5 (FIVE) MINUTES AS NEEDED FOR CHEST PAIN.   No facility-administered encounter medications on file as of 07/02/2022.  Reviewed chart prior to disease state call. Spoke with patient regarding BP  Recent Office Vitals: BP Readings from Last 3 Encounters:  12/03/21 90/60  09/28/21 128/60  09/18/21 (!) 132/54   Pulse Readings from Last 3 Encounters:  09/18/21 (!) 108  06/28/21 74  01/11/21 (!) 57    Wt Readings from Last 3 Encounters:  12/03/21 79 lb 6.4 oz (36 kg)  09/28/21 79 lb 1.6 oz (35.9 kg)  09/18/21 77 lb 9.6 oz (35.2 kg)     Kidney Function Lab Results  Component Value Date/Time   CREATININE 0.99 12/03/2021 02:42 PM   CREATININE 0.97 06/28/2021 02:15 PM   CREATININE 0.88 11/24/2017 12:37 PM   GFR 51.86 (L) 12/03/2021 02:42 PM   GFRNONAA 46 (L) 12/08/2020 01:33 PM   GFRAA 53 (L) 12/08/2020 01:33 PM       Latest Ref Rng & Units 12/03/2021    2:42 PM 06/28/2021    2:15 PM 12/08/2020    1:33 PM  BMP  Glucose 70 - 99 mg/dL 144  90  164   BUN 6 - 23 mg/dL '18  20  21   '$ Creatinine 0.40 - 1.20 mg/dL 0.99  0.97  1.10   BUN/Creat Ratio 12 - 28   19   Sodium 135 - 145 mEq/L 140  142  146   Potassium 3.5 - 5.1 mEq/L 3.8  4.1  4.4   Chloride 96 - 112 mEq/L 105  105  108    CO2 19 - 32 mEq/L '26  30  22   '$ Calcium 8.4 - 10.5 mg/dL 9.2  9.8  9.6     Current antihypertensive regimen:  No medications Unable to reach for assessment   Care Gaps: Zoster Vaccine - Overdue DEXA - Overdue Eye Exam - Overdue Urine Micro - Overdue Foot Exam - Overdue COVID Booster - Overdue HGB A1C - Overdue Flu Vaccine - Overdue TDAP - Postponed BP- 90/60 12/03/21 AWV- 06/2016 office aware to schedule as of 11/22 CCM-  Lab Results  Component Value Date   HGBA1C 5.8 (A) 09/28/2021    Star Rating Drugs: None    Ned Clines CMA Clinical Pharmacist Assistant [phone]

## 2022-07-04 ENCOUNTER — Telehealth: Payer: Self-pay | Admitting: Cardiology

## 2022-07-04 DIAGNOSIS — I340 Nonrheumatic mitral (valve) insufficiency: Secondary | ICD-10-CM

## 2022-07-04 NOTE — Telephone Encounter (Signed)
Sueanne Margarita, MD  06/28/2022  1:50 PM EDT     Normal heart function with mild stiffness of heart muscle normal for age, enlarged LA, severe MR.  LV dimensions are normal and EF is normal.  Please find out if she is having any symptoms and if no then repeat echo in 6 months    Called patient's son with results of echo. Patient's son was in a hurry, and could not discuss setting up echo in 6 months or scheduling an over due office visit. Patient's son stated to call him later for this. Will send to Dr. Theodosia Blender nurse to get patient schedule for office visit from recall. Will place order for echo in 6 months, and their department will  call to schedule echo.

## 2022-07-04 NOTE — Telephone Encounter (Signed)
Pt son returning a call for echo results

## 2022-09-09 ENCOUNTER — Telehealth: Payer: Self-pay | Admitting: Pharmacist

## 2022-09-09 NOTE — Chronic Care Management (AMB) (Signed)
    Chronic Care Management Pharmacy Assistant   Name: [name]  MRN: [id number] DOB: [birth year]  Reason for Encounter: Disease State   Conditions to be addressed/monitored: General Assessment Call per [name]  Recent office visits:  None   Recent consult visits:  None  Hospital visits:  None in previous 6 months  Medications: Outpatient Encounter Medications as of 09/09/2022  Medication Sig   ASPIRIN LOW DOSE 81 MG tablet TAKE ONE TABLET BY MOUTH DAILY AT BEDTIME.   donepezil (ARICEPT) 10 MG tablet TAKE 1 TABLET BY MOUTH AT BEDTIME   mirtazapine (REMERON) 30 MG tablet TAKE 1 TABLET (30 MG TOTAL) BY MOUTH AT BEDTIME.   nitroGLYCERIN (NITROSTAT) 0.4 MG SL tablet PLACE 1 TABLET (0.4 MG TOTAL) UNDER THE TONGUE EVERY 5 (FIVE) MINUTES AS NEEDED FOR CHEST PAIN.   No facility-administered encounter medications on file as of 09/09/2022.  [name] for General Review Call   Adherence Review:  Does the Clinical Pharmacist Assistant have access to adherence rates? Yes No star rated medications   Disease State Questions:  Able to connect with Patient? No Spoke to patient's son [name] Did patient have any problems with their health recently? No  Have you had any admissions or emergency room visits or worsening of your condition(s) since last visit? No  Have you had any visits with new specialists or providers since your last visit? No Explain: Son reports none other than with [facility] providers Have you had any new health care problem(s) since your last visit? No  Have you run out of any of your medications since you last spoke with clinical pharmacist? No  Are there any medications you are not taking as prescribed? No What kept you from taking your medications as prescribed? Are you having any issues or side effects with your medications? No  Do you have any other health concerns or questions you want to discuss with your  Clinical Pharmacist before your next visit? No  Are there any health concerns that you feel we can do a better job addressing? No Note Patient's response. Are you having any problems with any of the following since the last visit: (select all that apply)  None  12. Any falls since last visit? No   13. Any increased or uncontrolled pain since last visit? No    Care Gaps: Zoster Vaccine - Overdue DEXA - Overdue Eye Exam - Overdue Foot Exam - Overdue COVID Booster - Overdue HGB A1C - Overdue Flu Vaccine - Overdue TDAP - Postponed CCM- Son declined BP- 90/60 12/03/21 AWV- Office Aware to Schedule  Star Rating Drugs: None    [name] [name] Clinical Pharmacist Assistant [phone]

## 2022-09-10 ENCOUNTER — Other Ambulatory Visit: Payer: Self-pay | Admitting: Internal Medicine

## 2022-09-10 ENCOUNTER — Other Ambulatory Visit: Payer: Self-pay | Admitting: Neurology

## 2022-09-10 DIAGNOSIS — E43 Unspecified severe protein-calorie malnutrition: Secondary | ICD-10-CM

## 2022-10-25 ENCOUNTER — Ambulatory Visit: Payer: PPO | Attending: Cardiology | Admitting: Cardiology

## 2022-10-25 ENCOUNTER — Encounter: Payer: Self-pay | Admitting: Cardiology

## 2022-10-25 VITALS — BP 106/72 | HR 83 | Ht <= 58 in | Wt 84.4 lb

## 2022-10-25 DIAGNOSIS — E785 Hyperlipidemia, unspecified: Secondary | ICD-10-CM | POA: Diagnosis not present

## 2022-10-25 DIAGNOSIS — Z720 Tobacco use: Secondary | ICD-10-CM | POA: Diagnosis not present

## 2022-10-25 DIAGNOSIS — I25119 Atherosclerotic heart disease of native coronary artery with unspecified angina pectoris: Secondary | ICD-10-CM

## 2022-10-25 DIAGNOSIS — I10 Essential (primary) hypertension: Secondary | ICD-10-CM

## 2022-10-25 DIAGNOSIS — I6523 Occlusion and stenosis of bilateral carotid arteries: Secondary | ICD-10-CM

## 2022-10-25 DIAGNOSIS — I340 Nonrheumatic mitral (valve) insufficiency: Secondary | ICD-10-CM

## 2022-10-25 NOTE — Patient Instructions (Signed)
Medication Instructions:  Your physician recommends that you continue on your current medications as directed. Please refer to the Current Medication list given to you today.  *If you need a refill on your cardiac medications before your next appointment, please call your pharmacy*   Lab Work: TODAY: LIPIDS, ALT If you have labs (blood work) drawn today and your tests are completely normal, you will receive your results only by: [facility] (if you have MyChart) OR A paper copy in the mail If you have any lab test that is abnormal or we need to change your treatment, we will call you to review the results.   Testing/Procedures: Your physician has requested that you have a carotid duplex. This test is an ultrasound of the carotid arteries in your neck. It looks at blood flow through these arteries that supply the brain with blood. Allow one hour for this exam. There are no restrictions or special instructions.  Your physician has requested that you have an echocardiogram in FEBRUARY 2024. Echocardiography is a painless test that uses sound waves to create images of your heart. It provides your doctor with information about the size and shape of your heart and how well your heart's chambers and valves are working. This procedure takes approximately one hour. There are no restrictions for this procedure. Please do NOT wear cologne, perfume, aftershave, or lotions (deodorant is allowed). Please arrive 15 minutes prior to your appointment time.    Follow-Up: At [facility], you and your health needs are our priority.  As part of our continuing mission to provide you with exceptional heart care, we have created designated Provider Care Teams.  These Care Teams include your primary Cardiologist (physician) and Advanced Practice Providers (APPs -  Physician Assistants and Nurse Practitioners) who all work together to provide you with the care you need, when you need it.  We recommend  signing up for the patient portal called "MyChart".  Sign up information is provided on this After Visit Summary.  MyChart is used to connect with patients for Virtual Visits (Telemedicine).  Patients are able to view lab/test results, encounter notes, upcoming appointments, etc.  Non-urgent messages can be sent to your provider as well.   To learn more about what you can do with MyChart, go to [email or web address].    Your next appointment:   6 month(s)  The format for your next appointment:   In Person  Provider:   [name], MD   Important Information About Sugar

## 2022-10-25 NOTE — Progress Notes (Signed)
Cardiology Office Note:    Date:  10/25/2022   ID:  [name], DOB 1934/08/23, MRN [id number]  PCP:  Isaac Bliss, Rayford Halsted, MD  Cardiologist:  [name], MD    Referring MD: Isaac Bliss, [place]*   Chief Complaint  Patient presents with   Coronary Artery Disease   Hypertension   Hyperlipidemia   Mitral Regurgitation    History of Present Illness:    [name] is a 88 y.o. female with   Coronary artery disease Myoview 5/19 with anteroseptal and lateral ischemia CTA 10/19: Coronary calcium 662, moderate CAD in the LAD and LCx with borderline significance in the LCx by [facility] >> medical therapy unless significant symptoms Mitral regurgitation Moderate by TEE 11/2020 Carotid artery disease Diabetes mellitus Hypertension Hyperlipidemia Raynaud's disease History of elevated LFTs with dilated common bile duct Tobacco abuse Trigeminal neuralgia Medication non adherence Dementia Aortic Atherosclerosis by TEE 11/2020   Prior Cardiac Studies  TEE 11/2020 IMPRESSIONS    1. Left ventricular ejection fraction, by estimation, is 60 to 65%. The  left ventricle has normal function. The left ventricle has no regional  wall motion abnormalities.   2. Right ventricular systolic function is normal. The right ventricular  size is normal. Tricuspid regurgitation signal is inadequate for assessing  PA pressure.   3. Left atrial size was mildly dilated. No left atrial/left atrial  appendage thrombus was detected.   4. The mitral valve is normal in structure. Moderate mitral valve  regurgitation. No evidence of mitral stenosis.   5. The aortic valve is normal in structure. Aortic valve regurgitation is  not visualized. No aortic stenosis is present.   6. There is mild (Grade II) layered plaque involving the ascending aorta,  transverse aorta and descending aorta.   Conclusion(s)/Recommendation(s): Normal biventricular function with  moderate central  mitral regurgitation   Coronary CTA 09/21/2018 IMPRESSION: 1. Coronary calcium score of 662. This was 56 percentile for age and sex matched control.   2. Normal coronary origin with right dominance.   3. Moderate CAD in the proximal and mid LAD and proximal and mid LCX arteries. Additional analysis with CT FFR will be submitted.   4. Severe mitral annular calcifications. FFR 1. Left Main:  No significant stenosis.   2. LAD: Proximal: 0.92, distal: 0.80. 3. D1: 0.81. 4. LCX: Proximal: 0.98, distal: 0.78. 5. RCA: Proximal: 0.98, distal: 0.85. 6. PDA: 0.81.   IMPRESSION: 1. CT FFR analysis showed stenosis in the mid portion of a non-dominant LCX artery. A medical management would be recommended unless there are significant symptoms.   Carotid US 07/31/2018 Bilateral ICA 1-39   Myoview 04/15/2018 Large size, moderate severity partially reversible anteroseptal and septal perfusion defect and a medium size, moderate severity partially reversible perfusion defect of the lateral wall. These findings are suggestive of ischemia, but artifact cannot be excluded. Clinical correlation is recommended and additional testing may be necessary.   Echocardiogram 03/25/2018 Mild concentric LVH, EF normal, normal wall motion, grade 1 diastolic dysfunction, MAC, moderate MR, mild LAE, PASP 32  History of Present Illness: She underwent TEE 11/2020 showing normal LVF and moderate MR with aortic atherosclerosis.  She is here today for followup and is doing well.  She denies any chest pain or pressure, SOB, DOE, PND, orthopnea, LE edema, dizziness, palpitations or syncope. She is compliant with her meds and is tolerating meds with no SE.     Past Medical History:  Diagnosis Date   At risk for medication noncompliance  CAD (coronary artery disease)    a. borderline disease by CT 08/2018.   Diabetes mellitus, type 2 ([place])    History of cervical cancer    Hyperlipidemia    Hypertension    Mitral  regurgitation    severe MR by echo 10/2020 // TEE 1/22: EF 60-65, normal RVSF, mild LAE, mod MR.  Moderate to severe MR by echo 11/2021   Noncompliance with medication treatment due to underuse of medication    Raynaud's phenomenon    TN (trigeminal neuralgia)     Past Surgical History:  Procedure Laterality Date   BREAST BIOPSY     BREAST ENHANCEMENT SURGERY     CHOLECYSTECTOMY N/A 02/14/2014   Procedure: LAPAROSCOPIC CHOLECYSTECTOMY;  Surgeon: Ralene Ok, MD;  Location: [facility];  Service: General;  Laterality: N/A;   ERCP N/A 03/08/2015   Procedure: ENDOSCOPIC RETROGRADE CHOLANGIOPANCREATOGRAPHY (ERCP);  Surgeon: Ladene Artist, MD;  Location: [facility];  Service: Endoscopy;  Laterality: N/A;   MASS EXCISION Right 12/25/2016   Procedure: EXCISION RIGHT SCALP MASS SEBACEOUS CYST;  Surgeon: Coralie Keens, MD;  Location: [place];  Service: General;  Laterality: Right;   TEE WITHOUT CARDIOVERSION N/A 12/12/2020   Procedure: TRANSESOPHAGEAL ECHOCARDIOGRAM (TEE);  Surgeon: Sueanne Margarita, MD;  Location: [facility] [place];  Service: Cardiovascular;  Laterality: N/A;   [place]   for cervical cancer in situ    Current Medications: Current Meds  Medication Sig   ASPIRIN LOW DOSE 81 MG tablet TAKE ONE TABLET BY MOUTH DAILY AT BEDTIME.   donepezil (ARICEPT) 10 MG tablet TAKE 1/2 TABLET DAILY FOR 2 WEEKS, THEN INCREASE TO 1 TABLET DAILY AND CONTINUE (AM)   mirtazapine (REMERON) 30 MG tablet TAKE 1 TABLET (30 MG TOTAL) BY MOUTH AT BEDTIME.   nitroGLYCERIN (NITROSTAT) 0.4 MG SL tablet PLACE 1 TABLET (0.4 MG TOTAL) UNDER THE TONGUE EVERY 5 (FIVE) MINUTES AS NEEDED FOR CHEST PAIN.     Allergies:   Shrimp (diagnostic) and Morphine and related   Social History   Socioeconomic History   Marital status: Widowed    Spouse name: [name]   Number of children: 1   Years of education: Not on file   Highest education level: Not on file  Occupational History   Occupation:  Unemployed    Employer: NOT EMPLOYED  Tobacco Use   Smoking status: Every Day    Packs/day: 1.50    Years: 60.00    Total pack years: 90.00    Types: Cigarettes   Smokeless tobacco: Never  Vaping Use   Vaping Use: Never used  Substance and Sexual Activity   Alcohol use: No   Drug use: No   Sexual activity: Not on file  Other Topics Concern   Not on file  Social History Narrative   Married.  Lives in [place] with her husband.  No FH of heart disease.   1 adult son   + smoker   No EtOH   Right handed    Social Determinants of Health   Financial Resource Strain: Low Risk  (06/22/2021)   Overall Financial Resource Strain (CARDIA)    Difficulty of Paying Living Expenses: Not hard at all  Food Insecurity: Not on file  Transportation Needs: No Transportation Needs (06/22/2021)   PRAPARE - Hydrologist (Medical): No    Lack of Transportation (Non-Medical): No  Physical Activity: Not on file  Stress: Not on file  Social Connections: Not on file  Family History: The patient's family history is negative for CAD and Diabetes Mellitus II.  ROS:   Please see the history of present illness.    ROS  All other systems reviewed and negative.   EKGs/Labs/Other Studies Reviewed:    The following studies were reviewed today:    EKG:  EKG is ordered today and demonstrates NSR with PACs and LAFB  Recent Labs: 12/03/2021: BUN 18; Creatinine, Ser 0.99; Hemoglobin 13.9; Platelets 230.0; Potassium 3.8; Sodium 140; TSH 1.82   Recent Lipid Panel    Component Value Date/Time   CHOL 158 06/28/2021 1415   CHOL 128 07/19/2019 0833   TRIG 112.0 06/28/2021 1415   HDL 60.20 06/28/2021 1415   HDL 58 07/19/2019 0833   CHOLHDL 3 06/28/2021 1415   VLDL 22.4 06/28/2021 1415   LDLCALC 75 06/28/2021 1415   LDLCALC 40 07/19/2019 0833   LDLDIRECT 83.0 12/21/2014 1040    Physical Exam:    VS:  BP 106/72   Pulse 83   Ht _0  (1.422 m)   Wt 84 lb 6.4 oz  (38.3 kg)   SpO2 98%   BMI 18.92 kg/m     Wt Readings from Last 3 Encounters:  10/25/22 84 lb 6.4 oz (38.3 kg)  12/03/21 79 lb 6.4 oz (36 kg)  09/28/21 79 lb 1.6 oz (35.9 kg)     GEN: Well nourished, well developed in no acute distress HEENT: Normal NECK: No JVD; No carotid bruits LYMPHATICS: No lymphadenopathy CARDIAC:RRR, no murmurs, rubs, gallops RESPIRATORY:  Clear to auscultation without rales, wheezing or rhonchi  ABDOMEN: Soft, non-tender, non-distended MUSCULOSKELETAL:  No edema; No deformity  SKIN: Warm and dry NEUROLOGIC:  Alert and oriented x 3 PSYCHIATRIC:  Normal affect  ASSESSMENT:    1. Coronary artery disease involving native coronary artery of native heart with angina pectoris ([place])   2. Essential hypertension   3. Dyslipidemia   4. Tobacco abuse   5. Bilateral carotid artery stenosis   6. Severe mitral regurgitation    PLAN:    In order of problems listed above:  1. Coronary artery disease involving native coronary artery of native heart with angina pectoris ([facility]) -Moderate CAD by coronary CTA in October 2019 with borderline significant stenosis in the mid LCx.   -Medical therapy was recommended unless she has severe symptoms due to her significant dementia.   -she has chronic stable angina that is very stable  -She denies any recent episodes of chest pain -Continue drug management with aspirin 81 mg  2. Essential hypertension -BP controlled on exam today -she is no longer on any of her BP meds - I will check with her pharmacy to make sure her med list is correct   3. Hyperlipidemia, unspecified hyperlipidemia type -LDL goal < 70 -continue atorvastatin 35m daily -check FLP and ALT   4. Tobacco abuse -again recommended tobacco cessation  5.  Carotid artery stenosis -1-39% bilateral by dopplers 07/2018 -repeat carotid dopplers  -continue ASA and statin  6.  Mitral regurgitation -2D echo 03/2018 moderate MR -2D echo 10/2020 possibly severe  MR -TEE 11/2020 stable moderate MR -2D echo 06/2022 severe MR with normal LV dimensions and LVF with EF 60-65%>>completely asymptomatic and interestingly she has no MR murmur -repeat 2D echo 12/2022    Followup with me in 6 months  Medication Adjustments/Labs and Tests Ordered: Current medicines are reviewed at length with the patient today.  Concerns regarding medicines are outlined above.  Orders Placed This Encounter  Procedures   EKG 12-Lead   No orders of the defined types were placed in this encounter.   Signed, [name], MD  10/25/2022 1:48 PM    [place]

## 2022-10-25 NOTE — Addendum Note (Signed)
Addended by: Drue Novel I on: 10/25/2022 02:03 PM   Modules accepted: Orders

## 2022-10-26 LAB — LIPID PANEL
Chol/HDL Ratio: 3.7 ratio (ref 0.0–4.4)
Cholesterol, Total: 261 mg/dL — ABNORMAL HIGH (ref 100–199)
HDL: 70 mg/dL (ref >39)
LDL Chol Calc (NIH): 168 mg/dL — ABNORMAL HIGH (ref 0–99)
Triglycerides: 128 mg/dL (ref 0–149)
VLDL Cholesterol Cal: 23 mg/dL (ref 5–40)

## 2022-10-26 LAB — ALT: ALT: 8 IU/L (ref 0–32)

## 2022-10-28 ENCOUNTER — Ambulatory Visit (HOSPITAL_COMMUNITY)
Admission: RE | Admit: 2022-10-28 | Payer: PPO | Source: Ambulatory Visit | Attending: Cardiology | Admitting: Cardiology

## 2022-10-28 ENCOUNTER — Telehealth: Payer: Self-pay

## 2022-10-28 MED ORDER — ATORVASTATIN CALCIUM 80 MG PO TABS: 80.0000 mg | ORAL_TABLET | Freq: Every day | ORAL | 3 refills | Status: DC

## 2022-10-28 NOTE — Telephone Encounter (Signed)
The patient's son has been notified of the result and verbalized understanding.  All questions (if any) were answered. Bernestine Amass, RN 10/28/2022 3:54 PM   He reports that the patient stopped taking her medications on her own and he was unaware. When she went to see her PCP her vitals were stable and she seemed to be doing good so only necessary medications were restarted. I explained need for statin, he verbalized understanding and is agreeable with her starting back on atorvastatin.

## 2022-10-28 NOTE — Telephone Encounter (Signed)
-----   Message from Leeroy Bock, [place] sent at 10/28/2022  3:08 PM EST ----- Pt previously on atorvastatin '80mg'$  daily. This was discontinued at PCP visit 12/03/21. I don't see any reason documented in the note, but looks like pt was nonadherent with a lot of her meds so she may have just stopped taking her statin. Would resume her atorvastatin if pt tolerated well, otherwise could replace with rosuvastatin '40mg'$  daily if she had trouble tolerating the atorvastatin.

## 2022-11-06 ENCOUNTER — Other Ambulatory Visit: Payer: Self-pay | Admitting: Neurology

## 2022-11-06 ENCOUNTER — Other Ambulatory Visit: Payer: Self-pay | Admitting: Internal Medicine

## 2022-12-11 ENCOUNTER — Telehealth: Payer: Self-pay | Admitting: Cardiology

## 2022-12-11 DIAGNOSIS — Z7982 Long term (current) use of aspirin: Secondary | ICD-10-CM | POA: Diagnosis not present

## 2022-12-11 DIAGNOSIS — I4891 Unspecified atrial fibrillation: Secondary | ICD-10-CM | POA: Diagnosis not present

## 2022-12-11 MED ORDER — ATORVASTATIN CALCIUM 80 MG PO TABS: 80.0000 mg | ORAL_TABLET | Freq: Every day | ORAL | 3 refills | Status: DC

## 2022-12-11 NOTE — Telephone Encounter (Signed)
Pt medication was sent to pt's pharmacy as requested. Confirmation received.  °

## 2022-12-11 NOTE — Telephone Encounter (Signed)
*  STAT* If patient is at the pharmacy, call can be transferred to refill team.   1. Which medications need to be refilled? (please list name of each medication and dose if known)   atorvastatin (LIPITOR) 80 MG tablet    2. Which pharmacy/location (including street and city if local pharmacy) is medication to be sent to?    [place], [place] Phone: [phone]  Fax: [phone]      3. Do they need a 30 day or 90 day supply?  [place]

## 2022-12-16 ENCOUNTER — Telehealth: Payer: Self-pay

## 2022-12-16 NOTE — Telephone Encounter (Signed)
MEDICATION: donepezil (ARICEPT) 10 MG tablet   PHARMACY:   [place], [place] (Ph: [phone])    Comments: Patient is completely out, next appointment is not until August.   **Let patient know to contact pharmacy at the end of the day to make sure medication is ready. **  ** Please notify patient to allow 48-72 hours to process**  **Encourage patient to contact the pharmacy for refills or they can request refills through [facility]**

## 2022-12-17 MED ORDER — DONEPEZIL HCL 10 MG PO TABS: ORAL_TABLET | ORAL | 3 refills | Status: DC

## 2022-12-17 NOTE — Telephone Encounter (Signed)
Refills sent, thanks!

## 2022-12-17 NOTE — Addendum Note (Signed)
Addended by: Cameron Sprang on: 12/17/2022 10:28 AM   Modules accepted: Orders

## 2022-12-30 ENCOUNTER — Encounter: Payer: Self-pay | Admitting: Neurology

## 2022-12-30 ENCOUNTER — Ambulatory Visit: Payer: PPO | Admitting: Neurology

## 2022-12-30 VITALS — BP 148/64 | HR 80 | Ht <= 58 in | Wt 86.6 lb

## 2022-12-30 DIAGNOSIS — F03A0 Unspecified dementia, mild, without behavioral disturbance, psychotic disturbance, mood disturbance, and anxiety: Secondary | ICD-10-CM

## 2022-12-30 MED ORDER — DONEPEZIL HCL 10 MG PO TABS: ORAL_TABLET | ORAL | 3 refills | Status: DC

## 2022-12-30 NOTE — Patient Instructions (Addendum)
Good to see you. Continue Donepezil '10mg'$  daily. Continue to monitor driving. Follow-up in 6 months,call for any changes.   FALL PRECAUTIONS: Be cautious when walking. Scan the area for obstacles that may increase the risk of trips and falls. When getting up in the mornings, sit up at the edge of the bed for a few minutes before getting out of bed. Consider elevating the bed at the head end to avoid drop of blood pressure when getting up. Walk always in a well-lit room (use night lights in the walls). Avoid area rugs or power cords from appliances in the middle of the walkways. Use a walker or a cane if necessary and consider physical therapy for balance exercise. Get your eyesight checked regularly.  FINANCIAL OVERSIGHT: Supervision, especially oversight when making financial decisions or transactions is also recommended as difficulties arise.  HOME SAFETY: Consider the safety of the kitchen when operating appliances like stoves, microwave oven, and blender. Consider having supervision and share cooking responsibilities until no longer able to participate in those. Accidents with firearms and other hazards in the house should be identified and addressed as well.  DRIVING: Regarding driving, in patients with progressive memory problems, driving will be impaired. We advise to have someone else do the driving if trouble finding directions or if minor accidents are reported. Independent driving assessment is available to determine safety of driving.  ABILITY TO BE LEFT ALONE: If patient is unable to contact 911 operator, consider using LifeLine, or when the need is there, arrange for someone to stay with patients. Smoking is a fire hazard, consider supervision or cessation. Risk of wandering should be assessed by caregiver and if detected at any point, supervision and safe proof recommendations should be instituted.  MEDICATION SUPERVISION: Inability to self-administer medication needs to be constantly  addressed. Implement a mechanism to ensure safe administration of the medications.  RECOMMENDATIONS FOR ALL PATIENTS WITH MEMORY PROBLEMS: 1. Continue to exercise (Recommend 30 minutes of walking everyday, or 3 hours every week) 2. Increase social interactions - continue going to [place] and enjoy social gatherings with friends and family 3. Eat healthy, avoid fried foods and eat more fruits and vegetables 4. Maintain adequate blood pressure, blood sugar, and blood cholesterol level. Reducing the risk of stroke and cardiovascular disease also helps promoting better memory. 5. Avoid stressful situations. Live a simple life and avoid aggravations. Organize your time and prepare for the next day in anticipation. 6. Sleep well, avoid any interruptions of sleep and avoid any distractions in the bedroom that may interfere with adequate sleep quality 7. Avoid sugar, avoid sweets as there is a strong link between excessive sugar intake, diabetes, and cognitive impairment The Mediterranean diet has been shown to help patients reduce the risk of progressive memory disorders and reduces cardiovascular risk. This includes eating fish, eat fruits and green leafy vegetables, nuts like almonds and hazelnuts, walnuts, and also use olive oil. Avoid fast foods and fried foods as much as possible. Avoid sweets and sugar as sugar use has been linked to worsening of memory function.  There is always a concern of gradual progression of memory problems. If this is the case, then we may need to adjust level of care according to patient needs. Support, both to the patient and caregiver, should then be put into place.

## 2022-12-30 NOTE — Progress Notes (Signed)
NEUROLOGY FOLLOW UP OFFICE NOTE  [name] [id number] 87-30-1935  HISTORY OF PRESENT ILLNESS: I had the pleasure of seeing [name] in follow-up in the neurology clinic on 87/03/2023.  The patient was last seen in October 2022 for mild dementia and was lost to follow-up. She is accompanied by her adoptive son/close family friend [name] who provides additional information today.  Records and images were personally reviewed where available. When asked about her memory, she states "sometimes I forget things." [name] has noticed short-term memory changes but he and son [name] deny any concerns about living alone currently. [name] started helping with medications 2 years ago. He notes that a little over a year ago, she was on so many medications (14-16 pills) and not feeling well so she was not taking them. He and [name] started working with her physician on streamlining medications, she is on aspirin, Lipitor, Donepezil '10mg'$  daily, and Remeron '30mg'$  qhs. [name] sees her more regularly and fills her pillbox every 2 weeks. She has an automated pillbox with alarm and [name] reports she is good with taking her medications. She drives short distances and denies getting lost. She reports she drove to [place] by herself recently for shopping, they did not know about this trip. [employer]. She continues to cook for herself and for the [place] [employer], they both deny any issues with this. She denies misplacing things. No hygiene concerns. She continues to actively garden and take care of her 2 dogs and Koi fish. Mood is "okay," [name] denies any paranoia or hallucinations.    History on Initial Assessment 11/30/2019: This is a pleasant 89 year old right-handed woman with a history of hypertension, hyperlipidemia, diabetes, cervical cancer, trigeminal neuralgia, presenting for evaluation of memory loss. She feels her memory changed 2-3 years ago where she has difficulty remembering names.  Family started noticing changes in the past year. She lives alone and denies getting lost driving. She is very active with the [place] [employer] and visits daily to help them, praying every Sunday. Her son [name] reports she forgot how to get to the temple a few months ago. She denies missing bill payments. [name] reports her husband had managed the bills until he passed away 3 years ago. [name] has noticed she would not recall who she wrote checks for, and would not be aware of how much money is in her account, causing overdraws. She denies misplacing things, however [name] reports that they tried to return clothes last month and they could not find the card she used. She eventually found the card, but [name] found out she had opened up another new credit card account. [name] had expressed concern about medication compliance, her last HbA1c was 8.5. Medications were switched to a pillpack, which has helped, however she has a visiting nurse come twice a week who notes that she would have left over doses on the pillpack. She would not be able to verify which pouch she took them from. Family also noticed personality changes last Spring, she seemed to zone out during her granddaughter's graduation. She is usually bubbly and very social, they noticed she was less active, falling asleep and more tired. She is independent with dressing and bathing, no hygiene concerns. She has 2 dogs. She states her mood is okay and sleep is very good. There is no family history of dementia, no history of significant head injuries or alcohol use.  She denies any headaches, dizziness, dysarthria, dysphagia, neck/back pain, focal numbness/tingling/weakness, bowel/bladder dysfunction. No  anosmia, tremors, no falls. Vision is sometimes blurred.  Laboratory Data: Lab Results  Component Value Date   TSH 1.94 04/25/2020   Lab Results  Component Value Date   [id number] 160 04/25/2020   MRI brain without contrast in January 2021 did not show any acute  changes. There was moderate cortical loss in the frontal and parietal lobes, scattered small hyperintensities in the bilateral white matter (mild chronic microvascular disease).  PAST MEDICAL HISTORY: Past Medical History:  Diagnosis Date   At risk for medication noncompliance    CAD (coronary artery disease)    a. borderline disease by CT 08/2018.   Diabetes mellitus, type 2 ([place])    History of cervical cancer    Hyperlipidemia    Hypertension    Mitral regurgitation    severe MR by echo 10/2020 // TEE 1/22: EF 60-65, normal RVSF, mild LAE, mod MR.  Moderate to severe MR by echo 11/2021   Noncompliance with medication treatment due to underuse of medication    Raynaud's phenomenon    TN (trigeminal neuralgia)     MEDICATIONS: Current Outpatient Medications on File Prior to Visit  Medication Sig Dispense Refill   ASPIRIN LOW DOSE 81 MG tablet TAKE ONE TABLET BY MOUTH DAILY AT BEDTIME. 90 tablet 1   atorvastatin (LIPITOR) 80 MG tablet Take 1 tablet (80 mg total) by mouth daily. 90 tablet 3   donepezil (ARICEPT) 10 MG tablet Take 1 tablet daily 90 tablet 3   mirtazapine (REMERON) 30 MG tablet TAKE 1 TABLET (30 MG TOTAL) BY MOUTH AT BEDTIME. 90 tablet 1   Multiple Vitamin (MULTIVITAMIN) tablet Take 1 tablet by mouth daily.     nitroGLYCERIN (NITROSTAT) 0.4 MG SL tablet PLACE 1 TABLET (0.4 MG TOTAL) UNDER THE TONGUE EVERY 5 (FIVE) MINUTES AS NEEDED FOR CHEST PAIN. 50 tablet 3   No current facility-administered medications on file prior to visit.    ALLERGIES: Allergies  Allergen Reactions   Morphine And Related Itching    Short lived, mild itching.    FAMILY HISTORY: Family History  Problem Relation Age of Onset   CAD Neg Hx    Diabetes Mellitus II Neg Hx     SOCIAL HISTORY: Social History   Socioeconomic History   Marital status: Widowed    Spouse name: [name]   Number of children: 1   Years of education: Not on file   Highest education level: Not on file  Occupational  History   Occupation: Unemployed    Employer: NOT EMPLOYED  Tobacco Use   Smoking status: Every Day    Packs/day: 1.50    Years: 60.00    Total pack years: 90.00    Types: Cigarettes   Smokeless tobacco: Never  Vaping Use   Vaping Use: Never used  Substance and Sexual Activity   Alcohol use: No   Drug use: No   Sexual activity: Not on file  Other Topics Concern   Not on file  Social History Narrative   Married.  Lives in [place] with her husband.  No FH of heart disease.   1 adult son   + smoker   No EtOH   Right handed    Social Determinants of Health   Financial Resource Strain: Low Risk  (06/22/2021)   Overall Financial Resource Strain (CARDIA)    Difficulty of Paying Living Expenses: Not hard at all  Food Insecurity: Not on file  Transportation Needs: No Transportation Needs (06/22/2021)   PRAPARE - Transportation  Lack of Transportation (Medical): No    Lack of Transportation (Non-Medical): No  Physical Activity: Not on file  Stress: Not on file  Social Connections: Not on file  Intimate Partner Violence: Not on file     PHYSICAL EXAM: Vitals:   12/30/22 1253  BP: (!) 148/64  Pulse: 80  SpO2: 97%   General: No acute distress Head:  Normocephalic/atraumatic Skin/Extremities: No rash, no edema Neurological Exam: alert and oriented to person, place, day of week and year. Month is November, season is summer. No aphasia or dysarthria. Fund of knowledge is appropriate.  Recent and remote memory are impaired. Attention and concentration are normal,able to do serial 7s. MMSE 21/30.    12/30/2022    1:00 PM 01/01/2021   10:00 AM 05/30/2020   11:00 AM  MMSE - Mini Mental State Exam  Orientation to time '2 4 2  '$ Orientation to Place '5 5 5  '$ Registration '3 3 3  '$ Attention/ Calculation '4 5 5  '$ Recall 0 0 1  Language- name 2 objects '2 2 2  '$ Language- repeat '1 1 1  '$ Language- follow 3 step command '2 3 3  '$ Language- read & follow direction '1 1 1  '$ Write a sentence 0 0 0   Copy design '1 1 1  '$ Total score '21 25 24   '$ Cranial nerves: Pupils equal, round. Extraocular movements intact. No facial asymmetry.  Motor: moves all extremities symmetrically. Gait narrow-based and steady, no ataxia.    IMPRESSION: This is a pleasant 89 yo RH woman with a history of hypertension, hyperlipidemia, diabetes, cervical cancer, trigeminal neuralgia, with mild dementia, likely due to Alzheimer's disease. MRI brain showed moderate cortical loss in the frontal and parietal lobes, mild chronic microvascular disease. MMSE today 21/30 (25/30 in 12/2020). Family notes she is overall stable, continues to live alone. Continue Donepezil '10mg'$  daily. Continue close supervision, would monitor driving closely. Follow-up with Memory Disorders PA Sharene Butters in 6 months, call for any changes.    Thank you for allowing me to participate in her care.  Please do not hesitate to call for any questions or concerns.    Ellouise Newer, M.D.   CC: Dr. Deniece Ree

## 2023-01-10 ENCOUNTER — Ambulatory Visit (HOSPITAL_BASED_OUTPATIENT_CLINIC_OR_DEPARTMENT_OTHER): Payer: PPO

## 2023-01-10 ENCOUNTER — Ambulatory Visit (HOSPITAL_COMMUNITY)
Admission: RE | Admit: 2023-01-10 | Discharge: 2023-01-10 | Disposition: A | Discharge status: Home / Self Care | Payer: PPO | Source: Ambulatory Visit | Attending: Internal Medicine | Admitting: Internal Medicine

## 2023-01-10 DIAGNOSIS — I340 Nonrheumatic mitral (valve) insufficiency: Secondary | ICD-10-CM

## 2023-01-10 DIAGNOSIS — I6523 Occlusion and stenosis of bilateral carotid arteries: Secondary | ICD-10-CM | POA: Diagnosis not present

## 2023-01-10 LAB — ECHOCARDIOGRAM COMPLETE
Area-P 1/2: 4.68 cm2
MV M vel: 6.29 m/s
MV Peak grad: 158 mmHg
S' Lateral: 2.7 cm

## 2023-01-11 ENCOUNTER — Encounter: Payer: Self-pay | Admitting: Cardiology

## 2023-01-15 ENCOUNTER — Telehealth: Payer: Self-pay

## 2023-01-15 NOTE — Telephone Encounter (Signed)
Spoke briefly with son who states he is DPR but cannot speak right now as he is driving, asks me to call back at a later time.

## 2023-01-15 NOTE — Telephone Encounter (Signed)
-----   Message from Tara Margarita, MD sent at 01/10/2023 11:49 AM EST ----- 2D echo showed normal heart function EF 55 to 60% with moderately thickened heart muscle increased deafness of the heart called diastolic dysfunction.  Her left atrium is moderately enlarged.  She does have some degeneration of the mitral valve with calcification and thickening of the leaflets and severe leakiness of the mitral valve.  She has mild calcification of the aortic valve with no AS.  Compared to study in August there has been a slight decline in her EF going from 60 to 65% now down to 55 to 60% although LV dimensions are still normal.  I would like her referred to structural heart clinic to be followed -not sure she would be a candidate for MitraClip procedure given her underlying pathology of the mitral valve

## 2023-01-15 NOTE — Telephone Encounter (Signed)
-----   Message from Sueanne Margarita, MD sent at 01/11/2023  9:06 AM EST ----- 1-39% bilateral carotid stenosis - continue ASA and statin

## 2023-01-22 ENCOUNTER — Telehealth: Payer: Self-pay

## 2023-01-22 DIAGNOSIS — I340 Nonrheumatic mitral (valve) insufficiency: Secondary | ICD-10-CM

## 2023-01-22 NOTE — Telephone Encounter (Signed)
Results of carotid ultrasound and 2D echo reviewed with son [name] who verbalizes understanding of 1-39% bilateral carotid stenosis and acknowledges to continue ASA and statin. Also reviewed 2D Echo findings of decreased EF and severe leakiness of mitral valve as well as other structural findings. [name] agrees to referral to structural heart clinic, orders placed.

## 2023-01-22 NOTE — Telephone Encounter (Signed)
-----   Message from Traci R Turner, MD sent at 01/10/2023 11:49 AM EST ----- 2D echo showed normal heart function EF 55 to 60% with moderately thickened heart muscle increased deafness of the heart called diastolic dysfunction.  Her left atrium is moderately enlarged.  She does have some degeneration of the mitral valve with calcification and thickening of the leaflets and severe leakiness of the mitral valve.  She has mild calcification of the aortic valve with no AS.  Compared to study in August there has been a slight decline in her EF going from 60 to 65% now down to 55 to 60% although LV dimensions are still normal.  I would like her referred to structural heart clinic to be followed -not sure she would be a candidate for MitraClip procedure given her underlying pathology of the mitral valve 

## 2023-02-09 NOTE — Progress Notes (Signed)
Patient ID: [name] MRN: [id number] DOB/AGE: Feb 28, 1934 89 y.o.  Primary Care Physician:Hernandez Everardo Beals, MD Primary Cardiologist: [name], MD  FOCUSED CARDIOVASCULAR PROBLEM LIST:   1.  Hyperlipidemia 2.  Noncritical coronary artery disease based on coronary CTA 2019 3.  Aortic atherosclerosis 4.  Hypertension 5.  Severe mitral valve regurgitation with associated mitral annular calcification 6.  Longstanding tobacco abuse   HISTORY OF PRESENT ILLNESS: The patient is a 89 y.o. female with the indicated medical history here for recommendations regarding severe mitral regurgitation.  The patient has been followed by Dr. Radford Pax for a number of years.  She had a TEE done which demonstrated moderate mitral regurgitation a few years ago.  Most recent TTE which I reviewed demonstrated relatively preserved LV function with severe mitral regurgitation.  There is mitral annular calcification noted as well as thickening and some calcification associated with the leaflets.  She had a TEE a few years ago that I reviewed which demonstrated moderate mitral regurgitation and no significant calcification or leaflet thickening.  The amount of mitral annular calcification seemed to be much less on that study.  The patient is here with her son.  She lives by herself with 2 dogs.  She takes care of these dogs without any issues.  She is able to do all of her activities of daily living without shortness of breath or chest pain.  She denies any paroxysmal nocturnal dyspnea or orthopnea.  Peripheral edema, signs or symptoms of stroke, or severe bleeding while on she fortunately is not required any emergency room visits or hospitalizations.  She has smoked for very long time and continues to do so.  Past Medical History:  Diagnosis Date   At risk for medication noncompliance    CAD (coronary artery disease)    a. borderline disease by CT 08/2018.   Carotid artery disease  ([facility])    1-39% bilateral carotid stenosis by dopplers 12/2022   Diabetes mellitus, type 2 ([place])    History of cervical cancer    Hyperlipidemia    Hypertension    Mitral regurgitation    severe MR by echo 10/2020 // TEE 1/22: EF 60-65, normal RVSF, mild LAE, mod MR.  Moderate to severe MR by echo 11/2021   Noncompliance with medication treatment due to underuse of medication    Raynaud's phenomenon    TN (trigeminal neuralgia)     Past Surgical History:  Procedure Laterality Date   BREAST BIOPSY     BREAST ENHANCEMENT SURGERY     CHOLECYSTECTOMY N/A 02/14/2014   Procedure: LAPAROSCOPIC CHOLECYSTECTOMY;  Surgeon: Ralene Ok, MD;  Location: [facility];  Service: General;  Laterality: N/A;   ERCP N/A 03/08/2015   Procedure: ENDOSCOPIC RETROGRADE CHOLANGIOPANCREATOGRAPHY (ERCP);  Surgeon: Ladene Artist, MD;  Location: [facility];  Service: Endoscopy;  Laterality: N/A;   MASS EXCISION Right 12/25/2016   Procedure: EXCISION RIGHT SCALP MASS SEBACEOUS CYST;  Surgeon: Coralie Keens, MD;  Location: [facility];  Service: General;  Laterality: Right;   TEE WITHOUT CARDIOVERSION N/A 12/12/2020   Procedure: TRANSESOPHAGEAL ECHOCARDIOGRAM (TEE);  Surgeon: Sueanne Margarita, MD;  Location: [facility] ENDOSCOPY;  Service: Cardiovascular;  Laterality: N/A;   Foster   for cervical cancer in situ    Family History  Problem Relation Age of Onset   CAD Neg Hx    Diabetes Mellitus II Neg Hx     Social History   Socioeconomic History   Marital status: Widowed  Spouse name: [name]   Number of children: 1   Years of education: Not on file   Highest education level: Not on file  Occupational History   Occupation: Unemployed    Employer: NOT EMPLOYED  Tobacco Use   Smoking status: Every Day    Packs/day: 1.50    Years: 60.00    Additional pack years: 0.00    Total pack years: 90.00    Types: Cigarettes   Smokeless tobacco: Never  Vaping Use   Vaping Use: Never used  Substance  and Sexual Activity   Alcohol use: No   Drug use: No   Sexual activity: Not on file  Other Topics Concern   Not on file  Social History Narrative   Married.  Lives in [place] with her husband.  No FH of heart disease.   1 adult son   + smoker   No EtOH   Right handed    Social Determinants of Health   Financial Resource Strain: Low Risk  (06/22/2021)   Overall Financial Resource Strain (CARDIA)    Difficulty of Paying Living Expenses: Not hard at all  Food Insecurity: Not on file  Transportation Needs: No Transportation Needs (06/22/2021)   PRAPARE - Hydrologist (Medical): No    Lack of Transportation (Non-Medical): No  Physical Activity: Not on file  Stress: Not on file  Social Connections: Not on file  Intimate Partner Violence: Not on file     Prior to Admission medications   Medication Sig Start Date End Date Taking? Authorizing Provider  ASPIRIN LOW DOSE 81 MG tablet TAKE ONE TABLET BY MOUTH DAILY AT BEDTIME. 11/06/22   Isaac Bliss, Rayford Halsted, MD  atorvastatin (LIPITOR) 80 MG tablet Take 1 tablet (80 mg total) by mouth daily. 12/11/22   Sueanne Margarita, MD  donepezil (ARICEPT) 10 MG tablet Take 1 tablet daily 12/30/22   Cameron Sprang, MD  mirtazapine (REMERON) 30 MG tablet TAKE 1 TABLET (30 MG TOTAL) BY MOUTH AT BEDTIME. 09/11/22   Isaac Bliss, Rayford Halsted, MD  Multiple Vitamin (MULTIVITAMIN) tablet Take 1 tablet by mouth daily.    [provider]  nitroGLYCERIN (NITROSTAT) 0.4 MG SL tablet PLACE 1 TABLET (0.4 MG TOTAL) UNDER THE TONGUE EVERY 5 (FIVE) MINUTES AS NEEDED FOR CHEST PAIN. 08/25/20   Richardson Dopp T, PA-C    Allergies  Allergen Reactions   Morphine And Related Itching    Short lived, mild itching.    REVIEW OF SYSTEMS:  General: no fevers/chills/night sweats Eyes: no blurry vision, diplopia, or amaurosis ENT: no sore throat or hearing loss Resp: no cough, wheezing, or hemoptysis CV: no edema or  palpitations GI: no abdominal pain, nausea, vomiting, diarrhea, or constipation GU: no dysuria, frequency, or hematuria Skin: no rash Neuro: no headache, numbness, tingling, or weakness of extremities Musculoskeletal: no joint pain or swelling Heme: no bleeding, DVT, or easy bruising Endo: no polydipsia or polyuria  BP 132/86   Pulse 64   Ht 4\' 10"  (1.473 m)   Wt 82 lb 12.8 oz (37.6 kg)   BMI 17.31 kg/m   PHYSICAL EXAM: GEN:  AO x 3 in no acute distress HEENT: normal Dentition: Normal Neck: JVP normal. +2 carotid upstrokes without bruits. No thyromegaly. Lungs: equal expansion, clear bilaterally CV: Apex is discrete and nondisplaced, RRR with 3 out of 6 Abd: soft, non-tender, non-distended; no bruit; positive bowel sounds Ext: no edema, ecchymoses, or cyanosis Vascular: 2+ femoral pulses, 2+  radial pulses       Skin: warm and dry without rash Neuro: CN II-XII grossly intact; motor and sensory grossly intact    DATA AND STUDIES:  EKG: December 2023 sinus rhythm with PACs  2D ECHO: February 2024  1. Left ventricular ejection fraction, by estimation, is 55 to 60%. The  left ventricle has normal function. The left ventricle has no regional  wall motion abnormalities. There is moderate concentric left ventricular  hypertrophy. Left ventricular  diastolic parameters are consistent with Grade II diastolic dysfunction  (pseudonormalization).   2. Right ventricular systolic function is normal. The right ventricular  size is normal. There is mildly elevated pulmonary artery systolic  pressure. The estimated right ventricular systolic pressure is [id number] mmHg.   3. Left atrial size was moderately dilated.   4. The mitral valve is degenerative with thickening and calcification.  Restriction of posterior leaflet noted. Severe mitral valve regurgitation.  No evidence of mitral stenosis. Moderate mitral annular calcification.   5. The aortic valve is tricuspid. There is mild  calcification of the  aortic valve. Aortic valve regurgitation is not visualized. No aortic  stenosis is present.   6. The inferior vena cava is normal in size with greater than 50%  respiratory variability, suggesting right atrial pressure of 3 mmHg.    TEE: 2022  1. Left ventricular ejection fraction, by estimation, is 60 to 65%. The  left ventricle has normal function. The left ventricle has no regional  wall motion abnormalities.   2. Right ventricular systolic function is normal. The right ventricular  size is normal. Tricuspid regurgitation signal is inadequate for assessing  PA pressure.   3. Left atrial size was mildly dilated. No left atrial/left atrial  appendage thrombus was detected.   4. The mitral valve is normal in structure. Moderate mitral valve  regurgitation. No evidence of mitral stenosis.   5. The aortic valve is normal in structure. Aortic valve regurgitation is  not visualized. No aortic stenosis is present.   6. There is mild (Grade II) layered plaque involving the ascending aorta,  transverse aorta and descending aorta.   CARDIAC CATH: n/a  STS RISK CALCULATOR: pending  NHYA CLASS: 1    ASSESSMENT AND PLAN:   Severe mitral regurgitation  Coronary artery disease involving native coronary artery of native heart with angina pectoris ([place])  Essential hypertension  Dyslipidemia  The patient has developed severe yet asymptomatic mitral regurgitation that I suspect is related to mitral annular calcification.  It is quite remarkable the degree of calcification seen on her most recent echocardiogram compared to her prior TEE in 2022.  Had a long conversation with the patient and her son.  She is relatively asymptomatic.  She is also small, frail and has a longstanding history of ongoing tobacco abuse.  I think the threshold for intervention here is quite high.  I am concerned about the amount of calcification and whether she even has anatomy amenable to mitral  transcatheter edge-to-edge repair.  For now no further evaluation is necessary as the patient is asymptomatic.  I think if she were to develop lifestyle limiting symptoms or decrement in her ejection fraction and evaluation including a TEE could be pursued.  This would certainly require conversation regarding the patient's desires without being weighed against potential benefits in this frail patient very advanced age.  I think the threshold for intermittent intervention if even possible is very high.  I have personally reviewed the patients imaging data as summarized  above.  I have reviewed the natural history of mitral regurgitation with the patient and family members who are present today. We have discussed the limitations of medical therapy and the poor prognosis associated with symptomatic mitral regurgitation. We have also reviewed potential treatment options, including palliative medical therapy, conventional mitral surgery, and transcatheter mitral edge-to-edge repair. We discussed treatment options in the context of this patient's specific comorbid medical conditions.   All of the patient's questions were answered today. Will make further recommendations based on the results of studies outlined above.   Total time spent with patient today 60 minutes. This includes reviewing records, evaluating the patient and coordinating care.   Early Osmond, MD  02/14/2023 10:01 AM    [address], [place], [place]  [address] Phone: [phone]; Fax: [phone]

## 2023-02-14 ENCOUNTER — Ambulatory Visit: Payer: PPO | Attending: Internal Medicine | Admitting: Internal Medicine

## 2023-02-14 ENCOUNTER — Encounter: Payer: Self-pay | Admitting: Internal Medicine

## 2023-02-14 VITALS — BP 132/86 | HR 64 | Ht <= 58 in | Wt 82.8 lb

## 2023-02-14 DIAGNOSIS — I10 Essential (primary) hypertension: Secondary | ICD-10-CM

## 2023-02-14 DIAGNOSIS — I25119 Atherosclerotic heart disease of native coronary artery with unspecified angina pectoris: Secondary | ICD-10-CM | POA: Diagnosis not present

## 2023-02-14 DIAGNOSIS — E785 Hyperlipidemia, unspecified: Secondary | ICD-10-CM

## 2023-02-14 DIAGNOSIS — I340 Nonrheumatic mitral (valve) insufficiency: Secondary | ICD-10-CM | POA: Diagnosis not present

## 2023-02-14 NOTE — Patient Instructions (Signed)
Medication Instructions:  Your physician recommends that you continue on your current medications as directed. Please refer to the Current Medication list given to you today.  *If you need a refill on your cardiac medications before your next appointment, please call your pharmacy*  Follow-Up: At [facility], you and your health needs are our priority.  As part of our continuing mission to provide you with exceptional heart care, we have created designated Provider Care Teams.  These Care Teams include your primary Cardiologist (physician) and Advanced Practice Providers (APPs -  Physician Assistants and Nurse Practitioners) who all work together to provide you with the care you need, when you need it.  Your next appointment:   Follow up with Dr. Radford Pax as scheduled.   Follow up with Dr. Ali Lowe as needed.

## 2023-03-06 ENCOUNTER — Other Ambulatory Visit: Payer: Self-pay | Admitting: Internal Medicine

## 2023-03-06 DIAGNOSIS — E43 Unspecified severe protein-calorie malnutrition: Secondary | ICD-10-CM

## 2023-03-18 DIAGNOSIS — G309 Alzheimer's disease, unspecified: Secondary | ICD-10-CM | POA: Diagnosis not present

## 2023-03-18 DIAGNOSIS — F0280 Dementia in other diseases classified elsewhere without behavioral disturbance: Secondary | ICD-10-CM | POA: Diagnosis not present

## 2023-03-18 DIAGNOSIS — I700 Atherosclerosis of aorta: Secondary | ICD-10-CM | POA: Diagnosis not present

## 2023-03-18 DIAGNOSIS — Z681 Body mass index (BMI) 19 or less, adult: Secondary | ICD-10-CM | POA: Diagnosis not present

## 2023-03-18 DIAGNOSIS — E441 Mild protein-calorie malnutrition: Secondary | ICD-10-CM | POA: Diagnosis not present

## 2023-03-18 DIAGNOSIS — E1142 Type 2 diabetes mellitus with diabetic polyneuropathy: Secondary | ICD-10-CM | POA: Diagnosis not present

## 2023-04-07 ENCOUNTER — Other Ambulatory Visit (INDEPENDENT_AMBULATORY_CARE_PROVIDER_SITE_OTHER): Payer: PPO

## 2023-04-07 ENCOUNTER — Ambulatory Visit (INDEPENDENT_AMBULATORY_CARE_PROVIDER_SITE_OTHER): Payer: PPO | Admitting: Internal Medicine

## 2023-04-07 ENCOUNTER — Encounter: Payer: Self-pay | Admitting: Internal Medicine

## 2023-04-07 VITALS — BP 102/80 | HR 66 | Temp 97.5°F | Ht <= 58 in | Wt 84.8 lb

## 2023-04-07 DIAGNOSIS — E1169 Type 2 diabetes mellitus with other specified complication: Secondary | ICD-10-CM

## 2023-04-07 DIAGNOSIS — I10 Essential (primary) hypertension: Secondary | ICD-10-CM | POA: Diagnosis not present

## 2023-04-07 DIAGNOSIS — Z720 Tobacco use: Secondary | ICD-10-CM

## 2023-04-07 DIAGNOSIS — Z780 Asymptomatic menopausal state: Secondary | ICD-10-CM | POA: Diagnosis not present

## 2023-04-07 DIAGNOSIS — E785 Hyperlipidemia, unspecified: Secondary | ICD-10-CM | POA: Diagnosis not present

## 2023-04-07 DIAGNOSIS — Z0000 Encounter for general adult medical examination without abnormal findings: Secondary | ICD-10-CM

## 2023-04-07 LAB — COMPREHENSIVE METABOLIC PANEL
ALT: 27 U/L (ref 0–35)
AST: 33 U/L (ref 0–37)
Albumin: 4 g/dL (ref 3.5–5.2)
Alkaline Phosphatase: 108 U/L (ref 39–117)
BUN: 25 mg/dL — ABNORMAL HIGH (ref 6–23)
CO2: 24 mEq/L (ref 19–32)
Calcium: 9.3 mg/dL (ref 8.4–10.5)
Chloride: 109 mEq/L (ref 96–112)
Creatinine, Ser: 1.02 mg/dL (ref 0.40–1.20)
GFR: 49.57 mL/min — ABNORMAL LOW (ref >60.00)
Glucose, Bld: 64 mg/dL — ABNORMAL LOW (ref 70–99)
Potassium: 3.9 mEq/L (ref 3.5–5.1)
Sodium: 143 mEq/L (ref 135–145)
Total Bilirubin: 0.5 mg/dL (ref 0.2–1.2)
Total Protein: 7.1 g/dL (ref 6.0–8.3)

## 2023-04-07 LAB — CBC WITH DIFFERENTIAL/PLATELET
Basophils Absolute: 0.1 10*3/uL (ref 0.0–0.1)
Basophils Relative: 0.9 % (ref 0.0–3.0)
Eosinophils Absolute: 0.1 10*3/uL (ref 0.0–0.7)
Eosinophils Relative: 1.1 % (ref 0.0–5.0)
HCT: 46.1 % — ABNORMAL HIGH (ref 36.0–46.0)
Hemoglobin: 15.1 g/dL — ABNORMAL HIGH (ref 12.0–15.0)
Lymphocytes Relative: 21.1 % (ref 12.0–46.0)
Lymphs Abs: 1.6 10*3/uL (ref 0.7–4.0)
MCHC: 32.8 g/dL (ref 30.0–36.0)
MCV: 97.4 fl (ref 78.0–100.0)
Monocytes Absolute: 0.4 10*3/uL (ref 0.1–1.0)
Monocytes Relative: 4.8 % (ref 3.0–12.0)
Neutro Abs: 5.4 10*3/uL (ref 1.4–7.7)
Neutrophils Relative %: 72.1 % (ref 43.0–77.0)
Platelets: 207 10*3/uL (ref 150.0–400.0)
RBC: 4.73 Mil/uL (ref 3.87–5.11)
RDW: 14.3 % (ref 11.5–15.5)
WBC: 7.5 10*3/uL (ref 4.0–10.5)

## 2023-04-07 LAB — LIPID PANEL
Cholesterol: 147 mg/dL (ref 0–200)
HDL: 57.4 mg/dL (ref >39.00)
LDL Cholesterol: 68 mg/dL (ref 0–99)
NonHDL: 89.78
Total CHOL/HDL Ratio: 3
Triglycerides: 109 mg/dL (ref 0.0–149.0)
VLDL: 21.8 mg/dL (ref 0.0–40.0)

## 2023-04-07 LAB — HEMOGLOBIN A1C: Hgb A1c MFr Bld: 5.9 % (ref 4.6–6.5)

## 2023-04-07 NOTE — Patient Instructions (Signed)
-  Nice seeing you today!!  -Lab work today; will notify you once results are available.  -Remember to update Tdap, shingles and RSV vaccines at the pharmacy.  -See you back in 6 months.

## 2023-04-07 NOTE — Progress Notes (Signed)
Established Patient Office Visit     CC/Reason for Visit: Annual preventive exam and subsequent Medicare wellness visit  HPI: [name] is a 89 y.o. female who is coming in today for the above mentioned reasons. Past Medical History is significant for: Hyperlipidemia, type 2 diabetes, coronary artery disease, hypertension, ongoing tobacco use, valvular issues.  She is followed routinely by cardiology, however I have not seen her in a couple years.  She is overdue for Tdap, shingles, RSV vaccines.  She is here with her son [name] who is visiting from [place].  She is due for labs.  She elects to defer all cancer screening due to age, she is due for bone density.   Past Medical/Surgical History: Past Medical History:  Diagnosis Date   At risk for medication noncompliance    CAD (coronary artery disease)    a. borderline disease by CT 08/2018.   Carotid artery disease (HCC)    1-39% bilateral carotid stenosis by dopplers 12/2022   Diabetes mellitus, type 2 (HCC)    History of cervical cancer    Hyperlipidemia    Hypertension    Mitral regurgitation    severe MR by echo 10/2020 // TEE 1/22: EF 60-65, normal RVSF, mild LAE, mod MR.  Moderate to severe MR by echo 11/2021   Noncompliance with medication treatment due to underuse of medication    Raynaud's phenomenon    TN (trigeminal neuralgia)     Past Surgical History:  Procedure Laterality Date   BREAST BIOPSY     BREAST ENHANCEMENT SURGERY     CHOLECYSTECTOMY N/A 02/14/2014   Procedure: LAPAROSCOPIC CHOLECYSTECTOMY;  Surgeon: Axel Filler, MD;  Location: [facility] OR;  Service: General;  Laterality: N/A;   ERCP N/A 03/08/2015   Procedure: ENDOSCOPIC RETROGRADE CHOLANGIOPANCREATOGRAPHY (ERCP);  Surgeon: Meryl Dare, MD;  Location: [facility];  Service: Endoscopy;  Laterality: N/A;   MASS EXCISION Right 12/25/2016   Procedure: EXCISION RIGHT SCALP MASS SEBACEOUS CYST;  Surgeon: Abigail Miyamoto, MD;   Location: [facility] OR;  Service: General;  Laterality: Right;   TEE WITHOUT CARDIOVERSION N/A 12/12/2020   Procedure: TRANSESOPHAGEAL ECHOCARDIOGRAM (TEE);  Surgeon: Quintella Reichert, MD;  Location: [facility] ENDOSCOPY;  Service: Cardiovascular;  Laterality: N/A;   TOTAL ABDOMINAL HYSTERECTOMY  1963   for cervical cancer in situ    Social History:  reports that she has been smoking cigarettes. She has a 90.00 pack-year smoking history. She has never used smokeless tobacco. She reports that she does not drink alcohol and does not use drugs.  Allergies: Allergies  Allergen Reactions   Morphine And Related Itching    Short lived, mild itching.    Family History:  Family History  Problem Relation Age of Onset   CAD Neg Hx    Diabetes Mellitus II Neg Hx      Current Outpatient Medications:    ASPIRIN LOW DOSE 81 MG tablet, TAKE ONE TABLET BY MOUTH DAILY AT BEDTIME., Disp: 90 tablet, Rfl: 1   atorvastatin (LIPITOR) 80 MG tablet, Take 1 tablet (80 mg total) by mouth daily., Disp: 90 tablet, Rfl: 3   donepezil (ARICEPT) 10 MG tablet, Take 1 tablet daily, Disp: 90 tablet, Rfl: 3   mirtazapine (REMERON) 30 MG tablet, TAKE 1 TABLET (30 MG TOTAL) BY MOUTH AT BEDTIME., Disp: 90 tablet, Rfl: 1   Multiple Vitamin (MULTIVITAMIN) tablet, Take 1 tablet by mouth daily., Disp: , Rfl:    nitroGLYCERIN (NITROSTAT) 0.4 MG SL tablet, PLACE 1  TABLET (0.4 MG TOTAL) UNDER THE TONGUE EVERY 5 (FIVE) MINUTES AS NEEDED FOR CHEST PAIN., Disp: 50 tablet, Rfl: 3  Review of Systems:  Negative unless indicated in HPI.   Physical Exam: Vitals:   04/07/23 0920  BP: 102/80  Pulse: 66  Temp: (!) 97.5 F (36.4 C)  TempSrc: Oral  SpO2: 98%  Weight: 84 lb 12.8 oz (38.5 kg)  Height: 4' 7.5" (1.41 m)    Body mass index is 19.36 kg/m.   Physical Exam Vitals reviewed.  Constitutional:      General: She is not in acute distress.    Appearance: Normal appearance. She is not ill-appearing, toxic-appearing or diaphoretic.   HENT:     Head: Normocephalic.     Right Ear: Tympanic membrane, ear canal and external ear normal. There is no impacted cerumen.     Left Ear: Tympanic membrane, ear canal and external ear normal. There is no impacted cerumen.     Nose: Nose normal.     Mouth/Throat:     Mouth: Mucous membranes are moist.     Pharynx: Oropharynx is clear. No oropharyngeal exudate or posterior oropharyngeal erythema.  Eyes:     General: No scleral icterus.       Right eye: No discharge.        Left eye: No discharge.     Conjunctiva/sclera: Conjunctivae normal.     Pupils: Pupils are equal, round, and reactive to light.  Neck:     Vascular: No carotid bruit.  Cardiovascular:     Rate and Rhythm: Normal rate and regular rhythm.     Pulses: Normal pulses.     Heart sounds: Normal heart sounds.  Pulmonary:     Effort: Pulmonary effort is normal. No respiratory distress.     Breath sounds: Normal breath sounds.  Abdominal:     General: Abdomen is flat. Bowel sounds are normal.     Palpations: Abdomen is soft.  Musculoskeletal:        General: Normal range of motion.     Cervical back: Normal range of motion.  Skin:    General: Skin is warm and dry.  Neurological:     General: No focal deficit present.     Mental Status: She is alert and oriented to person, place, and time. Mental status is at baseline.  Psychiatric:        Mood and Affect: Mood normal.        Behavior: Behavior normal.        Thought Content: Thought content normal.        Judgment: Judgment normal.      Subsequent Medicare wellness visit   1. Risk factors, based on past  M,S,F - Cardiac Risk Factors include: advanced age (>61men, >36 women);diabetes mellitus   2.  Physical activities: Dietary issues and exercise activities discussed:  Current Exercise Habits: Home exercise routine, Type of exercise: treadmill, Time (Minutes): 30, Frequency (Times/Week): 3, Weekly Exercise (Minutes/Week): 90, Intensity: Moderate   3.   Depression/mood:  Flowsheet Row Office Visit from 04/07/2023 [facility] Total Score 0        4.  ADL's:    04/07/2023    9:09 AM  In your present state of health, do you have any difficulty performing the following activities:  Hearing? 0  Vision? 0  Difficulty concentrating or making decisions? 1  Walking or climbing stairs? 0  Dressing or bathing? 0  Doing errands, shopping?  1  Preparing Food and eating ? N  Using the Toilet? N  In the past six months, have you accidently leaked urine? N  Do you have problems with loss of bowel control? N  Managing your Medications? Y  Managing your Finances? Y  Housekeeping or managing your Housekeeping? N     5.  Fall risk:     06/28/2021    1:56 PM 09/18/2021   10:21 AM 12/03/2021    1:56 PM 12/30/2022   12:58 PM 04/07/2023    9:26 AM  Fall Risk  Falls in the past year? 0 0 0 0 0  Was there an injury with Fall? 0 0 0 0 0  Fall Risk Category Calculator 0 0 0 0 0  Fall Risk Category (Retired) Low Low Low    (RETIRED) Patient Fall Risk Level  Moderate fall risk Low fall risk    Patient at Risk for Falls Due to   No Fall Risks    Fall risk Follow up   Falls evaluation completed Falls evaluation completed Falls evaluation completed     6.  Home safety: No problems identified   7.  Height weight, and visual acuity: height and weight as above, vision/hearing: Vision Screening   Right eye Left eye Both eyes  Without correction 20/20 20/20 20/20   With correction        8.  Counseling: Ready to quit: Not Answered Counseling given: Not Answered    9. Lab orders based on risk factors: Laboratory update will be reviewed   10. Cognitive assessment:    12/30/2022    1:00 PM 01/01/2021   10:00 AM 05/30/2020   11:00 AM 09/22/2019   11:45 AM  MMSE - Mini Mental State Exam  Orientation to time 2 4 2 3   Orientation to Place 5 5 5 5   Registration 3 3 3 3   Attention/ Calculation 4 5 5  0  Recall 0 0  1 3  Language- name 2 objects 2 2 2 2   Language- repeat 1 1 1 1   Language- follow 3 step command 2 3 3 3   Language- read & follow direction 1 1 1 1   Write a sentence 0 0 0 0  Copy design 1 1 1  0  Total score 21 25 24 21         04/07/2023    9:14 AM  6CIT Screen  What Year? 4 points  What month? 3 points  What time? 0 points  Count back from 20 0 points  Months in reverse 4 points  Repeat phrase 10 points  Total Score 21 points     11. Screening: Patient provided with a written and personalized 5-10 year screening schedule in the AVS. Health Maintenance  Topic Date Due   DTaP/Tdap/Td vaccine (1 - Tdap) Never done   Zoster (Shingles) Vaccine (1 of 2) Never done   DEXA scan (bone density measurement)  Never done   Eye exam for diabetics  07/17/2011   Complete foot exam   10/22/2019   Hemoglobin A1C  03/28/2022   COVID-19 Vaccine (4 - 2023-24 season) 07/26/2022   Flu Shot  06/26/2023   Medicare Annual Wellness Visit  04/06/2024   Pneumonia Vaccine  Completed   HPV Vaccine  Aged Out    12. Provider List Update: Patient Care Team    Relationship Specialty Notifications Start End  Philip Aspen, Limmie Patricia, MD PCP - General Internal Medicine  10/01/18   Quintella Reichert, MD PCP -  Cardiology Cardiology Admissions 03/26/18   Van Clines, MD Consulting Physician Neurology  05/30/20   Verner Chol, [facility] (Inactive) Pharmacist Pharmacist  04/27/21    Comment: [phone]     13. Advance Directives: Does Patient Have a Medical Advance Directive?: Yes Type of Advance Directive: Living will, Healthcare Power of Attorney, Out of facility DNR (pink MOST or yellow form) Does patient want to make changes to medical advance directive?: No - Patient declined Copy of Healthcare Power of Attorney in Chart?: No - copy requested  14. Opioids: Patient is not on any opioid prescriptions and has no risk factors for a substance use disorder.   15.   Goals       Manage My Medicine       Timeframe:  Short-Term Goal Priority:  High Start Date:                             Expected End Date:                       Follow Up Date 09/22/21    - keep a list of all the medicines I take; vitamins and herbals too - use an alarm clock or phone to remind me to take my medicine  -switch times of taking medications to improve adherence and fit your schedule   Why is this important?   These steps will help you keep on track with your medicines.   Notes:       Quit Smoking      Track and Manage My Blood Pressure-Hypertension      Timeframe:  Short-Term Goal Priority:  Medium Start Date:                             Expected End Date:                       Follow Up Date 09/22/21    - check blood pressure weekly - choose a place to take my blood pressure (home, clinic or office, retail store) - write blood pressure results in a log or diary    Why is this important?   You won't feel high blood pressure, but it can still hurt your blood vessels.  High blood pressure can cause heart or kidney problems. It can also cause a stroke.  Making lifestyle changes like losing a little weight or eating less salt will help.  Checking your blood pressure at home and at different times of the day can help to control blood pressure.  If the doctor prescribes medicine remember to take it the way the doctor ordered.  Call the office if you cannot afford the medicine or if there are questions about it.     Notes:       Weight (lb) < 200 lb (90.7 kg) (pt-stated)      She does not want to lose weight         I have personally reviewed and noted the following in the patient's chart:   Medical and social history Use of alcohol, tobacco or illicit drugs  Current medications and supplements Functional ability and status Nutritional status Physical activity Advanced directives List of other physicians Hospitalizations, surgeries, and ER visits in previous 12 months Vitals Screenings  to include cognitive, depression, and falls Referrals and appointments  In addition,  I have reviewed and discussed with patient certain preventive protocols, quality metrics, and best practice recommendations. A written personalized care plan for preventive services as well as general preventive health recommendations were provided to patient.  Impression and Plan:  Encounter for subsequent annual wellness visit (AWV) in Medicare patient  Dyslipidemia  Essential hypertension  Tobacco abuse  Type 2 diabetes mellitus with other specified complication, without long-term current use of insulin (HCC) -     CBC with Differential/Platelet; Future -     Comprehensive metabolic panel; Future -     Hemoglobin A1c; Future -     Lipid panel; Future  Postmenopausal estrogen deficiency -     DG Bone Density; Future   -Recommend routine eye and dental care. -Healthy lifestyle discussed in detail. -Labs to be updated today. -Prostate cancer screening: N/a Health Maintenance  Topic Date Due   DTaP/Tdap/Td vaccine (1 - Tdap) Never done   Zoster (Shingles) Vaccine (1 of 2) Never done   DEXA scan (bone density measurement)  Never done   Eye exam for diabetics  07/17/2011   Complete foot exam   10/22/2019   Hemoglobin A1C  03/28/2022   COVID-19 Vaccine (4 - 2023-24 season) 07/26/2022   Flu Shot  06/26/2023   Medicare Annual Wellness Visit  04/06/2024   Pneumonia Vaccine  Completed   HPV Vaccine  Aged Out    -Advised to update Tdap, shingles, RSV vaccines.    Chaya Jan, MD [facility] at [facility]

## 2023-04-29 ENCOUNTER — Other Ambulatory Visit: Payer: Self-pay | Admitting: Internal Medicine

## 2023-05-01 ENCOUNTER — Ambulatory Visit: Payer: PPO | Attending: Cardiology | Admitting: Cardiology

## 2023-05-01 ENCOUNTER — Encounter: Payer: Self-pay | Admitting: Cardiology

## 2023-05-01 VITALS — BP 142/80 | HR 73 | Ht <= 58 in | Wt 81.4 lb

## 2023-05-01 DIAGNOSIS — I6523 Occlusion and stenosis of bilateral carotid arteries: Secondary | ICD-10-CM | POA: Diagnosis not present

## 2023-05-01 DIAGNOSIS — I340 Nonrheumatic mitral (valve) insufficiency: Secondary | ICD-10-CM | POA: Diagnosis not present

## 2023-05-01 DIAGNOSIS — E785 Hyperlipidemia, unspecified: Secondary | ICD-10-CM

## 2023-05-01 DIAGNOSIS — I10 Essential (primary) hypertension: Secondary | ICD-10-CM | POA: Diagnosis not present

## 2023-05-01 DIAGNOSIS — Z720 Tobacco use: Secondary | ICD-10-CM

## 2023-05-01 DIAGNOSIS — I25119 Atherosclerotic heart disease of native coronary artery with unspecified angina pectoris: Secondary | ICD-10-CM

## 2023-05-01 NOTE — Progress Notes (Signed)
Cardiology Office Note:    Date:  05/01/2023   ID:  [name], DOB 25-Aug-1934, MRN [id number]  PCP:  Philip Aspen, Limmie Patricia, MD  Cardiologist:  Armanda Magic, MD    Referring MD: Philip Aspen, Estel*   Chief Complaint  Patient presents with   Mitral Regurgitation   Hypertension   Hyperlipidemia   Coronary Artery Disease    History of Present Illness:    [name] is a 89 y.o. female with   Coronary artery disease Myoview 5/19 with anteroseptal and lateral ischemia CTA 10/19: Coronary calcium 662, moderate CAD in the LAD and LCx with borderline significance in the LCx by [facility] >> medical therapy unless significant symptoms Mitral regurgitation Moderate by TEE 11/2020 Carotid artery disease Diabetes mellitus Hypertension Hyperlipidemia Raynaud's disease History of elevated LFTs with dilated common bile duct Tobacco abuse Trigeminal neuralgia Medication non adherence Dementia Aortic Atherosclerosis by TEE 11/2020   Prior Cardiac Studies  TEE 11/2020 IMPRESSIONS    1. Left ventricular ejection fraction, by estimation, is 60 to 65%. The  left ventricle has normal function. The left ventricle has no regional  wall motion abnormalities.   2. Right ventricular systolic function is normal. The right ventricular  size is normal. Tricuspid regurgitation signal is inadequate for assessing  PA pressure.   3. Left atrial size was mildly dilated. No left atrial/left atrial  appendage thrombus was detected.   4. The mitral valve is normal in structure. Moderate mitral valve  regurgitation. No evidence of mitral stenosis.   5. The aortic valve is normal in structure. Aortic valve regurgitation is  not visualized. No aortic stenosis is present.   6. There is mild (Grade II) layered plaque involving the ascending aorta,  transverse aorta and descending aorta.   Conclusion(s)/Recommendation(s): Normal biventricular function with  moderate central  mitral regurgitation   Coronary CTA 09/21/2018 IMPRESSION: 1. Coronary calcium score of 662. This was 44 percentile for age and sex matched control.   2. Normal coronary origin with right dominance.   3. Moderate CAD in the proximal and mid LAD and proximal and mid LCX arteries. Additional analysis with CT FFR will be submitted.   4. Severe mitral annular calcifications. FFR 1. Left Main:  No significant stenosis.   2. LAD: Proximal: 0.92, distal: 0.80. 3. D1: 0.81. 4. LCX: Proximal: 0.98, distal: 0.78. 5. RCA: Proximal: 0.98, distal: 0.85. 6. PDA: 0.81.   IMPRESSION: 1. CT FFR analysis showed stenosis in the mid portion of a non-dominant LCX artery. A medical management would be recommended unless there are significant symptoms.   Carotid US 07/31/2018 Bilateral ICA 1-39   Myoview 04/15/2018 Large size, moderate severity partially reversible anteroseptal and septal perfusion defect and a medium size, moderate severity partially reversible perfusion defect of the lateral wall. These findings are suggestive of ischemia, but artifact cannot be excluded. Clinical correlation is recommended and additional testing may be necessary.   Echocardiogram 03/25/2018 Mild concentric LVH, EF normal, normal wall motion, grade 1 diastolic dysfunction, MAC, moderate MR, mild LAE, PASP 32  History of Present Illness: She underwent TEE 11/2020 showing normal LVF and moderate MR with aortic atherosclerosis.  She is here today for followup and is doing well.  She denies any chest pain or pressure, SOB, DOE, PND, orthopnea, LE edema, dizziness, palpitations or syncope. She is compliant with her meds and is tolerating meds with no SE.  tolerating meds with no SE.     Past Medical History:  Diagnosis Date  At risk for medication noncompliance    CAD (coronary artery disease)    a. borderline disease by CT 08/2018.   Carotid artery disease (HCC)    1-39% bilateral carotid stenosis by dopplers 12/2022    Diabetes mellitus, type 2 (HCC)    History of cervical cancer    Hyperlipidemia    Hypertension    Mitral regurgitation    severe MR by echo 10/2020 // TEE 1/22: EF 60-65, normal RVSF, mild LAE, mod MR.  Moderate to severe MR by echo 11/2021   Noncompliance with medication treatment due to underuse of medication    Raynaud's phenomenon    TN (trigeminal neuralgia)     Past Surgical History:  Procedure Laterality Date   BREAST BIOPSY     BREAST ENHANCEMENT SURGERY     CHOLECYSTECTOMY N/A 02/14/2014   Procedure: LAPAROSCOPIC CHOLECYSTECTOMY;  Surgeon: Axel Filler, MD;  Location: [facility] OR;  Service: General;  Laterality: N/A;   ERCP N/A 03/08/2015   Procedure: ENDOSCOPIC RETROGRADE CHOLANGIOPANCREATOGRAPHY (ERCP);  Surgeon: Meryl Dare, MD;  Location: [facility]COPY;  Service: Endoscopy;  Laterality: N/A;   MASS EXCISION Right 12/25/2016   Procedure: EXCISION RIGHT SCALP MASS SEBACEOUS CYST;  Surgeon: Abigail Miyamoto, MD;  Location: [facility] OR;  Service: General;  Laterality: Right;   TEE WITHOUT CARDIOVERSION N/A 12/12/2020   Procedure: TRANSESOPHAGEAL ECHOCARDIOGRAM (TEE);  Surgeon: Quintella Reichert, MD;  Location: [facility] ENDOSCOPY;  Service: Cardiovascular;  Laterality: N/A;   TOTAL ABDOMINAL HYSTERECTOMY  1963   for cervical cancer in situ    Current Medications: Current Meds  Medication Sig   ASPIRIN LOW DOSE 81 MG tablet TAKE ONE TABLET BY MOUTH DAILY AT BEDTIME.   atorvastatin (LIPITOR) 80 MG tablet Take 1 tablet (80 mg total) by mouth daily.   cholecalciferol (VITAMIN D3) 25 MCG (1000 UNIT) tablet Take 1,000 Units by mouth daily.   donepezil (ARICEPT) 10 MG tablet Take 1 tablet daily   mirtazapine (REMERON) 30 MG tablet TAKE 1 TABLET (30 MG TOTAL) BY MOUTH AT BEDTIME.   Multiple Vitamin (MULTIVITAMIN) tablet Take 1 tablet by mouth daily.   nitroGLYCERIN (NITROSTAT) 0.4 MG SL tablet PLACE 1 TABLET (0.4 MG TOTAL) UNDER THE TONGUE EVERY 5 (FIVE) MINUTES AS NEEDED FOR CHEST PAIN.      Allergies:   Morphine and codeine   Social History   Socioeconomic History   Marital status: Widowed    Spouse name: Rosanne Ashing   Number of children: 1   Years of education: Not on file   Highest education level: Not on file  Occupational History   Occupation: Unemployed    Employer: NOT EMPLOYED  Tobacco Use   Smoking status: Every Day    Packs/day: 1.50    Years: 60.00    Additional pack years: 0.00    Total pack years: 90.00    Types: Cigarettes   Smokeless tobacco: Never  Vaping Use   Vaping Use: Never used  Substance and Sexual Activity   Alcohol use: No   Drug use: No   Sexual activity: Not on file  Other Topics Concern   Not on file  Social History Narrative   Married.  Lives in [place] with her husband.  No FH of heart disease.   1 adult son   + smoker   No EtOH   Right handed    Social Determinants of Health   Financial Resource Strain: Low Risk  (04/07/2023)   Overall Financial Resource Strain (CARDIA)    Difficulty  of Paying Living Expenses: Not hard at all  Food Insecurity: No Food Insecurity (04/07/2023)   Hunger Vital Sign    Worried About Running Out of Food in the Last Year: Never true    Ran Out of Food in the Last Year: Never true  Transportation Needs: No Transportation Needs (04/07/2023)   PRAPARE - Administrator, Civil Service (Medical): No    Lack of Transportation (Non-Medical): No  Physical Activity: Insufficiently Active (04/07/2023)   Exercise Vital Sign    Days of Exercise per Week: 3 days    Minutes of Exercise per Session: 30 min  Stress: No Stress Concern Present (04/07/2023)   [employer] of Occupational Health - Occupational Stress Questionnaire    Feeling of Stress : Not at all  Social Connections: Moderately Integrated (04/07/2023)   Social Connection and Isolation Panel [NHANES]    Frequency of Communication with Friends and Family: More than three times a week    Frequency of Social Gatherings with Friends and  Family: More than three times a week    Attends Religious Services: More than 4 times per year    Active Member of [employer] or Organizations: Yes    Attends Banker Meetings: More than 4 times per year    Marital Status: Widowed     Family History: The patient's family history is negative for CAD and Diabetes Mellitus II.  ROS:   Please see the history of present illness.    ROS  All other systems reviewed and negative.   EKGs/Labs/Other Studies Reviewed:    The following studies were reviewed today:    EKG:  EKG is not ordered today   Recent Labs: 04/07/2023: ALT 27; BUN 25; Creatinine, Ser 1.02; Hemoglobin 15.1; Platelets 207.0; Potassium 3.9; Sodium 143   Recent Lipid Panel    Component Value Date/Time   CHOL 147 04/07/2023 1001   CHOL 261 (H) 10/25/2022 1415   TRIG 109.0 04/07/2023 1001   HDL 57.40 04/07/2023 1001   HDL 70 10/25/2022 1415   CHOLHDL 3 04/07/2023 1001   VLDL 21.8 04/07/2023 1001   LDLCALC 68 04/07/2023 1001   LDLCALC 168 (H) 10/25/2022 1415   LDLDIRECT 83.0 12/21/2014 1040    Physical Exam:    VS:  BP (!) 142/80   Pulse 73   Ht 4\' 7"  (1.397 m)   Wt 81 lb 6.4 oz (36.9 kg)   SpO2 98%   BMI 18.92 kg/m     Wt Readings from Last 3 Encounters:  05/01/23 81 lb 6.4 oz (36.9 kg)  04/07/23 84 lb 12.8 oz (38.5 kg)  02/14/23 82 lb 12.8 oz (37.6 kg)     GEN: Well nourished, well developed in no acute distress HEENT: Normal NECK: No JVD; No carotid bruits LYMPHATICS: No lymphadenopathy CARDIAC:RRR, no murmurs, rubs, gallops RESPIRATORY:  Clear to auscultation without rales, wheezing or rhonchi  ABDOMEN: Soft, non-tender, non-distended MUSCULOSKELETAL:  No edema; No deformity  SKIN: Warm and dry NEUROLOGIC:  Alert and oriented x 3 PSYCHIATRIC:  Normal affect  ASSESSMENT:    1. Coronary artery disease involving native coronary artery of native heart with angina pectoris (HCC)   2. Essential hypertension   3. Dyslipidemia   4. Tobacco  abuse   5. Bilateral carotid artery stenosis   6. Severe mitral regurgitation    PLAN:    In order of problems listed above:  1. Coronary artery disease involving native coronary artery of native heart with angina pectoris (  HCC) -Moderate CAD by coronary CTA in October 2019 with borderline significant stenosis in the mid LCx.   -Medical therapy was recommended unless she has severe symptoms due to her significant dementia.   -Her chronic stable angina is controlled on medical therapy -She denies any recent episodes of chest pain -Continue prescription drug management with aspirin 81 mg daily and atorvastatin 80 mg daily with as needed refills  2. Essential hypertension -BP is adequately controlled on exam  -She has not required any antihypertensive therapy  3. Hyperlipidemia, unspecified hyperlipidemia type -LDL goal < 70 -I have personally reviewed and interpreted outside labs performed by patient's PCP which showed LDL 68 and HDL 57 -Continue prescription drug management with atorvastatin 80 mg daily with as needed refills   4. Tobacco abuse -again recommended tobacco cessation  5.  Carotid artery stenosis -1-39% bilateral by dopplers 12/2022 okay -continue ASA and statin  6.  Mitral regurgitation -2D echo 03/2018 moderate MR -2D echo 10/2020 possibly severe MR -TEE 11/2020 stable moderate MR -2D echo 06/2022 severe MR with normal LV dimensions and LVF with EF 60-65%>>completely asymptomatic and interestingly she has no MR murmur -repeat 2D echo 12/2022 with EF 55 to 60% with normal LV size and moderate LVH, borderline pulmonary hypertension with PASP 39 mmHg and severe mitral regurgitation related restriction of the posterior leaflet>> she is completely asymptomatic and we will continue to watch LV dimensions and LV function.   -She she was seen by the heart team and due to the fact that she is very small and frail with longstanding ongoing tobacco abuse it was felt that threshold  for intervention was high.  She has significant calcification of her valve and he felt that anatomy may not be amenable to mitral transcatheter edge-to-edge repair.  Given that she is asymptomatic we felt that no further intervention at this time was recommended.  If she develops lifestyle limiting symptoms or decline in EF then further evaluation with TEE should be pursued. -Repeat 2D echo August 2024    Followup with me in 6 months  Medication Adjustments/Labs and Tests Ordered: Current medicines are reviewed at length with the patient today.  Concerns regarding medicines are outlined above.  No orders of the defined types were placed in this encounter.  No orders of the defined types were placed in this encounter.   Signed, Armanda Magic, MD  05/01/2023 11:16 AM    [facility]

## 2023-05-01 NOTE — Addendum Note (Signed)
Addended by: Luellen Pucker on: 05/01/2023 11:21 AM   Modules accepted: Orders

## 2023-05-01 NOTE — Patient Instructions (Addendum)
Medication Instructions:  Your physician recommends that you continue on your current medications as directed. Please refer to the Current Medication list given to you today.  *If you need a refill on your cardiac medications before your next appointment, please call your pharmacy*   Lab Work: None.  If you have labs (blood work) drawn today and your tests are completely normal, you will receive your results only by: MyChart Message (if you have MyChart) OR A paper copy in the mail If you have any lab test that is abnormal or we need to change your treatment, we will call you to review the results.   Testing/Procedures: Your physician has requested that you have an echocardiogram in August 2024. Echocardiography is a painless test that uses sound waves to create images of your heart. It provides your doctor with information about the size and shape of your heart and how well your heart's chambers and valves are working. This procedure takes approximately one hour. There are no restrictions for this procedure. Please do NOT wear cologne, perfume, aftershave, or lotions (deodorant is allowed). Please arrive 15 minutes prior to your appointment time.    Follow-Up: At [facility], you and your health needs are our priority.  As part of our continuing mission to provide you with exceptional heart care, we have created designated Provider Care Teams.  These Care Teams include your primary Cardiologist (physician) and Advanced Practice Providers (APPs -  Physician Assistants and Nurse Practitioners) who all work together to provide you with the care you need, when you need it.  We recommend signing up for the patient portal called "MyChart".  Sign up information is provided on this After Visit Summary.  MyChart is used to connect with patients for Virtual Visits (Telemedicine).  Patients are able to view lab/test results, encounter notes, upcoming appointments, etc.  Non-urgent messages  can be sent to your provider as well.   To learn more about what you can do with MyChart, go to [email or web address].    Your next appointment:   6 month(s)  Provider:   Armanda Magic, MD

## 2023-05-12 ENCOUNTER — Inpatient Hospital Stay (HOSPITAL_COMMUNITY)
Admission: EM | Admit: 2023-05-12 | Discharge: 2023-05-14 | DRG: 071 | Disposition: A | Discharge status: Home Health | Payer: PPO | Attending: Internal Medicine | Admitting: Internal Medicine

## 2023-05-12 ENCOUNTER — Emergency Department (HOSPITAL_COMMUNITY): Payer: PPO

## 2023-05-12 ENCOUNTER — Encounter (HOSPITAL_COMMUNITY): Payer: Self-pay

## 2023-05-12 ENCOUNTER — Other Ambulatory Visit: Payer: Self-pay

## 2023-05-12 DIAGNOSIS — E119 Type 2 diabetes mellitus without complications: Secondary | ICD-10-CM

## 2023-05-12 DIAGNOSIS — D696 Thrombocytopenia, unspecified: Secondary | ICD-10-CM | POA: Diagnosis present

## 2023-05-12 DIAGNOSIS — I10 Essential (primary) hypertension: Secondary | ICD-10-CM | POA: Diagnosis not present

## 2023-05-12 DIAGNOSIS — R531 Weakness: Secondary | ICD-10-CM | POA: Diagnosis not present

## 2023-05-12 DIAGNOSIS — Z87440 Personal history of urinary (tract) infections: Secondary | ICD-10-CM

## 2023-05-12 DIAGNOSIS — F17210 Nicotine dependence, cigarettes, uncomplicated: Secondary | ICD-10-CM | POA: Diagnosis present

## 2023-05-12 DIAGNOSIS — Z79899 Other long term (current) drug therapy: Secondary | ICD-10-CM

## 2023-05-12 DIAGNOSIS — Z8541 Personal history of malignant neoplasm of cervix uteri: Secondary | ICD-10-CM

## 2023-05-12 DIAGNOSIS — G9341 Metabolic encephalopathy: Principal | ICD-10-CM

## 2023-05-12 DIAGNOSIS — N179 Acute kidney failure, unspecified: Secondary | ICD-10-CM | POA: Diagnosis present

## 2023-05-12 DIAGNOSIS — Z885 Allergy status to narcotic agent status: Secondary | ICD-10-CM

## 2023-05-12 DIAGNOSIS — G500 Trigeminal neuralgia: Secondary | ICD-10-CM | POA: Diagnosis present

## 2023-05-12 DIAGNOSIS — D649 Anemia, unspecified: Secondary | ICD-10-CM

## 2023-05-12 DIAGNOSIS — E785 Hyperlipidemia, unspecified: Secondary | ICD-10-CM | POA: Diagnosis present

## 2023-05-12 DIAGNOSIS — Z7982 Long term (current) use of aspirin: Secondary | ICD-10-CM

## 2023-05-12 DIAGNOSIS — R54 Age-related physical debility: Secondary | ICD-10-CM | POA: Diagnosis present

## 2023-05-12 DIAGNOSIS — Z720 Tobacco use: Secondary | ICD-10-CM | POA: Diagnosis present

## 2023-05-12 DIAGNOSIS — R4182 Altered mental status, unspecified: Secondary | ICD-10-CM | POA: Diagnosis not present

## 2023-05-12 DIAGNOSIS — R079 Chest pain, unspecified: Secondary | ICD-10-CM | POA: Diagnosis not present

## 2023-05-12 DIAGNOSIS — Z9049 Acquired absence of other specified parts of digestive tract: Secondary | ICD-10-CM

## 2023-05-12 DIAGNOSIS — E860 Dehydration: Secondary | ICD-10-CM | POA: Diagnosis present

## 2023-05-12 DIAGNOSIS — I499 Cardiac arrhythmia, unspecified: Secondary | ICD-10-CM | POA: Diagnosis not present

## 2023-05-12 DIAGNOSIS — M549 Dorsalgia, unspecified: Secondary | ICD-10-CM | POA: Diagnosis not present

## 2023-05-12 DIAGNOSIS — I2510 Atherosclerotic heart disease of native coronary artery without angina pectoris: Secondary | ICD-10-CM | POA: Diagnosis present

## 2023-05-12 DIAGNOSIS — F0390 Unspecified dementia without behavioral disturbance: Secondary | ICD-10-CM

## 2023-05-12 DIAGNOSIS — R9431 Abnormal electrocardiogram [ECG] [EKG]: Secondary | ICD-10-CM | POA: Diagnosis not present

## 2023-05-12 LAB — CBC
HCT: 23.6 % — ABNORMAL LOW (ref 36.0–46.0)
Hemoglobin: 7.5 g/dL — ABNORMAL LOW (ref 12.0–15.0)
MCH: 31.6 pg (ref 26.0–34.0)
MCHC: 31.8 g/dL (ref 30.0–36.0)
MCV: 99.6 fL (ref 80.0–100.0)
Platelets: 103 10*3/uL — ABNORMAL LOW (ref 150–400)
RBC: 2.37 MIL/uL — ABNORMAL LOW (ref 3.87–5.11)
RDW: 13.1 % (ref 11.5–15.5)
WBC: 7.4 10*3/uL (ref 4.0–10.5)
nRBC: 0 % (ref 0.0–0.2)

## 2023-05-12 LAB — CBG MONITORING, ED: Glucose-Capillary: 94 mg/dL (ref 70–99)

## 2023-05-12 LAB — POC OCCULT BLOOD, ED: Fecal Occult Bld: NEGATIVE

## 2023-05-12 MED ORDER — LACTATED RINGERS IV BOLUS
500.0000 mL | Freq: Once | INTRAVENOUS | Status: AC
  Administered 2023-05-12: 500 mL via INTRAVENOUS

## 2023-05-12 NOTE — ED Triage Notes (Addendum)
Patient BIB [facility] from home for an initial complaint of CP which resolved and patient now reports sacral back pain 3-5/10. Family reports the patient's [facility] was out and patient presents to EMS with tenting skin turgor. BP 182/70, HR 68. CBG 95, A&Ox4, NAD. Patient reports that she takes HTN meds and took them today.

## 2023-05-12 NOTE — ED Provider Notes (Signed)
[facility]-EMERGENCY DEPT [facility] Emergency Department Provider Note MRN:  [id number]  Arrival date & time: 05/13/23     Chief Complaint   Back Pain   History of Present Illness   [name] is a 89 y.o. year-old female presents to the ED with chief complaint of AMS.  Was visited by other family members today and didn't seem herself.  Wasn't responding normally.  AC was out.  Concern for dehydration.  Family took patient to their home, but never really recovered and EMS was called an brought the patient to the hospital.  Patient complained of some chest pain earlier today, but none now.  Now she complains of some low back pain.    Denies any known recent illnesses.  She isn't visited everyday.  Unknown period of time that patient didn't have AC.  Might have been 2-3 days.  Hx of dementia.  Stays at home alone.  Normally does pretty well on her own.  History provided by patient.   Review of Systems  Pertinent positive and negative review of systems noted in HPI.    Physical Exam   Vitals:   05/13/23 0544 05/13/23 0600  BP: (!) 120/41 (!) 122/49  Pulse: 82 64  Resp: 16 15  Temp:    SpO2: 99% 100%    CONSTITUTIONAL:  frail-appearing, NAD NEURO:  Alert and oriented x 3, CN 3-12 grossly intact EYES:  eyes equal and reactive ENT/NECK:  Supple, no stridor, dry mucous membranes CARDIO:  normal rate, regular rhythm, appears well-perfused  PULM:  No respiratory distress, CTAB GI/GU:  non-distended, non focal tenderness MSK/SPINE:  No gross deformities, no edema, moves all extremities  SKIN:  no rash, atraumatic   *Additional and/or pertinent findings included in MDM below  Diagnostic and Interventional Summary    EKG Interpretation  Date/Time:    Ventricular Rate:    PR Interval:    QRS Duration:   QT Interval:    QTC Calculation:   R Axis:     Text Interpretation:         Labs Reviewed  COMPREHENSIVE METABOLIC PANEL - Abnormal; Notable for the  following components:      Result Value   Sodium 133 (*)    Potassium 3.2 (*)    CO2 12 (*)    BUN 55 (*)    Creatinine, Ser 1.21 (*)    Calcium 6.0 (*)    Total Protein 4.1 (*)    Albumin 2.2 (*)    GFR, Estimated 43 (*)    All other components within normal limits  CBC - Abnormal; Notable for the following components:   RBC 2.37 (*)    Hemoglobin 7.5 (*)    HCT 23.6 (*)    Platelets 103 (*)    All other components within normal limits  HEMOGLOBIN AND HEMATOCRIT, BLOOD - Abnormal; Notable for the following components:   Hemoglobin 9.8 (*)    HCT 29.8 (*)    All other components within normal limits  URINALYSIS, ROUTINE W REFLEX MICROSCOPIC  HEMOGLOBIN AND HEMATOCRIT, BLOOD  HEMOGLOBIN AND HEMATOCRIT, BLOOD  HEMOGLOBIN AND HEMATOCRIT, BLOOD  FERRITIN  FOLATE  IRON AND TIBC  RETICULOCYTES  VITAMIN B12  BASIC METABOLIC PANEL  CALCIUM, IONIZED  MAGNESIUM  PROTIME-INR  CK  CEA  CBG MONITORING, ED  POC OCCULT BLOOD, ED  TYPE AND SCREEN  ABO/RH  TROPONIN I (HIGH SENSITIVITY)  TROPONIN I (HIGH SENSITIVITY)    CT ABDOMEN PELVIS W CONTRAST  Final Result  CT HEAD WO CONTRAST ( )  Final Result    DG Chest Port 1 View  Final Result      Medications  nicotine (NICODERM CQ - dosed in mg/24 hours) patch 14 mg (has no administration in time range)  pantoprazole (PROTONIX) injection 40 mg (has no administration in time range)  acetaminophen (TYLENOL) tablet 650 mg (has no administration in time range)    Or  acetaminophen (TYLENOL) suppository 650 mg (has no administration in time range)  lactated ringers bolus 500 mL (500 mLs Intravenous Bolus 05/12/23 2315)  iohexol (OMNIPAQUE) 350 MG/ML injection 75 mL (75 mLs Intravenous Contrast Given 05/13/23 0357)     Procedures  /  Critical Care Procedures  ED Course and Medical Decision Making  I have reviewed the triage vital signs, the nursing notes, and pertinent available records from the EMR.  Social Determinants  Affecting Complexity of Care: Patient has no clinically significant social determinants affecting this chief complaint..   ED Course: Clinical Course as of 05/13/23 0647  Mon May 12, 2023  2241 DG Chest [place] 1 View [RB]    Clinical Course User Index [RB] Roxy Horseman, PA-C    Medical Decision Making Amount and/or Complexity of Data Reviewed Labs: ordered.    Details: HGB is 7.5, last was 15.1 on 04/07/23.  Will check POC occult stool.  Will likely need admission and workup for acute anemia. Negative hemoccult.  Radiology: ordered and independent interpretation performed. Decision-making details documented in ED Course.    Details: No obvious opacity seen on CXR No ICH or obvious mass seen on head CT  Risk Decision regarding hospitalization.     Consultants: I consulted with Hospitalist, Dr. Joneen Roach, who is appreciated for admitting.   Treatment and Plan: Patient's exam and diagnostic results are concerning for anemia and AMS.  Feel that patient will need admission to the hospital for further treatment and evaluation.    Final Clinical Impressions(s) / ED Diagnoses     ICD-10-CM   1. Metabolic encephalopathy  G93.41       ED Discharge Orders     None         Discharge Instructions Discussed with and Provided to Patient:   Discharge Instructions   None      Roxy Horseman, PA-C 05/13/23 4540    Marily Memos, MD 05/13/23 2307

## 2023-05-13 ENCOUNTER — Inpatient Hospital Stay (HOSPITAL_COMMUNITY): Payer: PPO

## 2023-05-13 DIAGNOSIS — N179 Acute kidney failure, unspecified: Secondary | ICD-10-CM | POA: Diagnosis not present

## 2023-05-13 DIAGNOSIS — R54 Age-related physical debility: Secondary | ICD-10-CM | POA: Diagnosis not present

## 2023-05-13 DIAGNOSIS — I10 Essential (primary) hypertension: Secondary | ICD-10-CM | POA: Diagnosis not present

## 2023-05-13 DIAGNOSIS — Z9049 Acquired absence of other specified parts of digestive tract: Secondary | ICD-10-CM | POA: Diagnosis not present

## 2023-05-13 DIAGNOSIS — Z87440 Personal history of urinary (tract) infections: Secondary | ICD-10-CM | POA: Diagnosis not present

## 2023-05-13 DIAGNOSIS — Z7982 Long term (current) use of aspirin: Secondary | ICD-10-CM | POA: Diagnosis not present

## 2023-05-13 DIAGNOSIS — R079 Chest pain, unspecified: Secondary | ICD-10-CM | POA: Diagnosis not present

## 2023-05-13 DIAGNOSIS — F0390 Unspecified dementia without behavioral disturbance: Secondary | ICD-10-CM

## 2023-05-13 DIAGNOSIS — E785 Hyperlipidemia, unspecified: Secondary | ICD-10-CM | POA: Diagnosis not present

## 2023-05-13 DIAGNOSIS — F17210 Nicotine dependence, cigarettes, uncomplicated: Secondary | ICD-10-CM | POA: Diagnosis not present

## 2023-05-13 DIAGNOSIS — I2510 Atherosclerotic heart disease of native coronary artery without angina pectoris: Secondary | ICD-10-CM | POA: Diagnosis not present

## 2023-05-13 DIAGNOSIS — Z8541 Personal history of malignant neoplasm of cervix uteri: Secondary | ICD-10-CM | POA: Diagnosis not present

## 2023-05-13 DIAGNOSIS — E119 Type 2 diabetes mellitus without complications: Secondary | ICD-10-CM | POA: Diagnosis not present

## 2023-05-13 DIAGNOSIS — E860 Dehydration: Secondary | ICD-10-CM | POA: Diagnosis not present

## 2023-05-13 DIAGNOSIS — G9341 Metabolic encephalopathy: Secondary | ICD-10-CM

## 2023-05-13 DIAGNOSIS — Z885 Allergy status to narcotic agent status: Secondary | ICD-10-CM | POA: Diagnosis not present

## 2023-05-13 DIAGNOSIS — D649 Anemia, unspecified: Secondary | ICD-10-CM | POA: Diagnosis not present

## 2023-05-13 DIAGNOSIS — Z79899 Other long term (current) drug therapy: Secondary | ICD-10-CM | POA: Diagnosis not present

## 2023-05-13 DIAGNOSIS — G500 Trigeminal neuralgia: Secondary | ICD-10-CM | POA: Diagnosis not present

## 2023-05-13 DIAGNOSIS — D696 Thrombocytopenia, unspecified: Secondary | ICD-10-CM | POA: Diagnosis not present

## 2023-05-13 DIAGNOSIS — R9431 Abnormal electrocardiogram [ECG] [EKG]: Secondary | ICD-10-CM | POA: Diagnosis not present

## 2023-05-13 DIAGNOSIS — R4182 Altered mental status, unspecified: Secondary | ICD-10-CM | POA: Diagnosis not present

## 2023-05-13 DIAGNOSIS — R109 Unspecified abdominal pain: Secondary | ICD-10-CM | POA: Diagnosis not present

## 2023-05-13 LAB — COMPREHENSIVE METABOLIC PANEL
ALT: 12 U/L (ref 0–44)
AST: 28 U/L (ref 15–41)
Albumin: 2.2 g/dL — ABNORMAL LOW (ref 3.5–5.0)
Alkaline Phosphatase: 51 U/L (ref 38–126)
Anion gap: 10 (ref 5–15)
BUN: 55 mg/dL — ABNORMAL HIGH (ref 8–23)
CO2: 12 mmol/L — ABNORMAL LOW (ref 22–32)
Calcium: 6 mg/dL — CL (ref 8.9–10.3)
Chloride: 111 mmol/L (ref 98–111)
Creatinine, Ser: 1.21 mg/dL — ABNORMAL HIGH (ref 0.44–1.00)
GFR, Estimated: 43 mL/min — ABNORMAL LOW (ref >60)
Glucose, Bld: 89 mg/dL (ref 70–99)
Potassium: 3.2 mmol/L — ABNORMAL LOW (ref 3.5–5.1)
Sodium: 133 mmol/L — ABNORMAL LOW (ref 135–145)
Total Bilirubin: 1.1 mg/dL (ref 0.3–1.2)
Total Protein: 4.1 g/dL — ABNORMAL LOW (ref 6.5–8.1)

## 2023-05-13 LAB — IRON AND TIBC
Iron: 45 ug/dL (ref 28–170)
Saturation Ratios: 23 % (ref 10.4–31.8)
TIBC: 199 ug/dL — ABNORMAL LOW (ref 250–450)
UIBC: 154 ug/dL

## 2023-05-13 LAB — BASIC METABOLIC PANEL WITH GFR
Anion gap: 9 (ref 5–15)
BUN: 56 mg/dL — ABNORMAL HIGH (ref 8–23)
CO2: 21 mmol/L — ABNORMAL LOW (ref 22–32)
Calcium: 8.1 mg/dL — ABNORMAL LOW (ref 8.9–10.3)
Chloride: 103 mmol/L (ref 98–111)
Creatinine, Ser: 1.43 mg/dL — ABNORMAL HIGH (ref 0.44–1.00)
GFR, Estimated: 35 mL/min — ABNORMAL LOW
Glucose, Bld: 94 mg/dL (ref 70–99)
Potassium: 3.6 mmol/L (ref 3.5–5.1)
Sodium: 133 mmol/L — ABNORMAL LOW (ref 135–145)

## 2023-05-13 LAB — PROTIME-INR
INR: 0.9 (ref 0.8–1.2)
Prothrombin Time: 12.6 seconds (ref 11.4–15.2)

## 2023-05-13 LAB — URINALYSIS, ROUTINE W REFLEX MICROSCOPIC
Bilirubin Urine: NEGATIVE
Glucose, UA: NEGATIVE mg/dL
Hgb urine dipstick: NEGATIVE
Ketones, ur: NEGATIVE mg/dL
Leukocytes,Ua: NEGATIVE
Nitrite: NEGATIVE
Protein, ur: NEGATIVE mg/dL
Specific Gravity, Urine: 1.019 (ref 1.005–1.030)
pH: 5 (ref 5.0–8.0)

## 2023-05-13 LAB — HEMOGLOBIN AND HEMATOCRIT, BLOOD
HCT: 29.8 % — ABNORMAL LOW (ref 36.0–46.0)
HCT: 39 % (ref 36.0–46.0)
Hemoglobin: 9.8 g/dL — ABNORMAL LOW (ref 12.0–15.0)
Hemoglobin: 13.2 g/dL (ref 12.0–15.0)

## 2023-05-13 LAB — RETICULOCYTES
Immature Retic Fract: 14.7 % (ref 2.3–15.9)
RBC.: 4.13 MIL/uL (ref 3.87–5.11)
Retic Count, Absolute: 52 K/uL (ref 19.0–186.0)
Retic Ct Pct: 1.3 % (ref 0.4–3.1)

## 2023-05-13 LAB — MAGNESIUM: Magnesium: 2 mg/dL (ref 1.7–2.4)

## 2023-05-13 LAB — TROPONIN I (HIGH SENSITIVITY)
Troponin I (High Sensitivity): 13 ng/L (ref <18)
Troponin I (High Sensitivity): 9 ng/L (ref <18)

## 2023-05-13 LAB — TYPE AND SCREEN
ABO/RH(D): B POS
Antibody Screen: NEGATIVE

## 2023-05-13 LAB — ABO/RH: ABO/RH(D): B POS

## 2023-05-13 LAB — VITAMIN B12: Vitamin B-12: 422 pg/mL (ref 180–914)

## 2023-05-13 LAB — CK: Total CK: 61 U/L (ref 38–234)

## 2023-05-13 LAB — FOLATE: Folate: 10.6 ng/mL

## 2023-05-13 LAB — FERRITIN: Ferritin: 126 ng/mL (ref 11–307)

## 2023-05-13 MED ORDER — NICOTINE 14 MG/24HR TD PT24
14.0000 mg | MEDICATED_PATCH | Freq: Every day | TRANSDERMAL | Status: DC
  Administered 2023-05-13 – 2023-05-14 (×2): 14 mg via TRANSDERMAL
  Filled 2023-05-13 (×2): qty 1

## 2023-05-13 MED ORDER — ACETAMINOPHEN 650 MG RE SUPP: 650.0000 mg | Freq: Four times a day (QID) | RECTAL | Status: DC | PRN

## 2023-05-13 MED ORDER — ACETAMINOPHEN 325 MG PO TABS: 650.0000 mg | ORAL_TABLET | Freq: Four times a day (QID) | ORAL | Status: DC | PRN

## 2023-05-13 MED ORDER — PANTOPRAZOLE SODIUM 40 MG IV SOLR
40.0000 mg | Freq: Two times a day (BID) | INTRAVENOUS | Status: DC
  Administered 2023-05-13: 40 mg via INTRAVENOUS
  Filled 2023-05-13 (×2): qty 10

## 2023-05-13 MED ORDER — DONEPEZIL HCL 10 MG PO TABS
10.0000 mg | ORAL_TABLET | Freq: Every day | ORAL | Status: DC
  Administered 2023-05-13 – 2023-05-14 (×2): 10 mg via ORAL
  Filled 2023-05-13 (×2): qty 1

## 2023-05-13 MED ORDER — IOHEXOL 350 MG/ML SOLN
75.0000 mL | Freq: Once | INTRAVENOUS | Status: AC | PRN
  Administered 2023-05-13: 75 mL via INTRAVENOUS

## 2023-05-13 MED ORDER — SODIUM CHLORIDE 0.9 % IV SOLN: INTRAVENOUS | Status: DC

## 2023-05-13 MED ORDER — MIRTAZAPINE 15 MG PO TABS
30.0000 mg | ORAL_TABLET | Freq: Every day | ORAL | Status: DC
  Administered 2023-05-13: 30 mg via ORAL
  Filled 2023-05-13: qty 2

## 2023-05-13 MED ORDER — PANTOPRAZOLE SODIUM 40 MG PO TBEC
40.0000 mg | DELAYED_RELEASE_TABLET | Freq: Every day | ORAL | Status: DC
  Administered 2023-05-14: 40 mg via ORAL
  Filled 2023-05-13: qty 1

## 2023-05-13 NOTE — Progress Notes (Signed)
Brief same day note:  Patient is 89 year old female with history of dementia, coronary artery  disease, diabetes type 2, hyperlipidemia, hypertension who lives alone brought to the emergency department for the evaluation of altered mentation.  Patient appeared tired, confused than baseline.  On normal circumstances, she is able to do her ADLs.  On presentation hemoglobin was 7.5.  Hemoglobin was 15.1 as for 04/24/2023.  Lab work showed creatinine 1.2.  FOBT negative.Patient admitted for further evaluation.  Patient seen and examined at bedside today this morning.  Son was at bedside.  She feels much better and she knows that she is in the hospital.  She is eager to go home.  Long discussion held with the patient and the son about importance of monitoring 1 more night and continue current management plan  Assessment and plan  Normocytic anemia: No history of passage of melena or hematochezia.  FOBT negative.  Initial hemoglobin was 7.5 but repeat hemoglobin was found to be 9.8 which further improved to 13.2.  Will continue to monitor hemoglobin.  Continue Protonix.  CT abdomen/pelvis did not show any acute findings. Optimal vitamin B12, folic acid level. The earlier hemoglobin of 7.5 could be an error.  Check CBC tomorrow  Altered mentation on the background of dementia: Looked dehydrated on presentation.  Also found to be inside hot room.  Started on IV fluids.  No leukocytosis or fever.  Rameron on hold. UA notes history of UTI, chest x-ray did not show pneumonia. CT head did not show any acute findings.  AKI : Baseline creatinine normal .creatinine currently in the range of 1.4.  Continue gentle IV fluids lites.  Check BMP tomorrow  Hypocalcemia/thrombocytopenia: Continue to monitor.  Calcium supplemented  Hyperlipidemia: Statin on hold  Tobacco use: Nicotine patch ordered  Debility/deconditioning: Will consult PT

## 2023-05-13 NOTE — Plan of Care (Signed)

## 2023-05-13 NOTE — ED Notes (Signed)
ED TO INPATIENT HANDOFF REPORT  ED Nurse Name and Phone #: Nicholos Johns [phone]  S Name/Age/Gender [name] 89 y.o. female Room/Bed: 039C/039C  Code Status   Code Status: Full Code  Home/SNF/Other Home Patient oriented to: self, place, time, and situation Is this baseline? Yes   Triage Complete: Triage complete  Chief Complaint Acute metabolic encephalopathy [G93.41]  Triage Note Patient BIB [facility] from home for an initial complaint of CP which resolved and patient now reports sacral back pain 3-5/10. Family reports the patient's [facility] was out and patient presents to EMS with tenting skin turgor. BP 182/70, HR 68. CBG 95, A&Ox4, NAD. Patient reports that she takes HTN meds and took them today.   Allergies Allergies  Allergen Reactions   Morphine And Codeine Itching    Short lived, mild itching.    Level of Care/Admitting Diagnosis ED Disposition     ED Disposition  Admit   Condition  --   Comment  Hospital Area: [facility] [[id number]]  Level of Care: Telemetry Medical [104]  May admit patient to [facility] or [facility] if equivalent level of care is available:: Yes  Covid Evaluation: Confirmed COVID Negative  Diagnosis: Acute metabolic encephalopathy [[id number]]  Admitting Physician: Gery Pray [[id number]]  Attending Physician: Gery Pray [[id number]]  Certification:: I certify this patient will need inpatient services for at least 2 midnights  Estimated Length of Stay: 2          B Medical/Surgery History Past Medical History:  Diagnosis Date   At risk for medication noncompliance    CAD (coronary artery disease)    a. borderline disease by CT 08/2018.   Carotid artery disease (HCC)    1-39% bilateral carotid stenosis by dopplers 12/2022   Diabetes mellitus, type 2 (HCC)    History of cervical cancer    Hyperlipidemia    Hypertension    Mitral regurgitation    severe MR by echo 10/2020 // TEE 1/22: EF 60-65, normal RVSF, mild  LAE, mod MR.  Moderate to severe MR by echo 11/2021   Noncompliance with medication treatment due to underuse of medication    Raynaud's phenomenon    TN (trigeminal neuralgia)    Past Surgical History:  Procedure Laterality Date   BREAST BIOPSY     BREAST ENHANCEMENT SURGERY     CHOLECYSTECTOMY N/A 02/14/2014   Procedure: LAPAROSCOPIC CHOLECYSTECTOMY;  Surgeon: Axel Filler, MD;  Location: [facility] OR;  Service: General;  Laterality: N/A;   ERCP N/A 03/08/2015   Procedure: ENDOSCOPIC RETROGRADE CHOLANGIOPANCREATOGRAPHY (ERCP);  Surgeon: Meryl Dare, MD;  Location: [facility];  Service: Endoscopy;  Laterality: N/A;   MASS EXCISION Right 12/25/2016   Procedure: EXCISION RIGHT SCALP MASS SEBACEOUS CYST;  Surgeon: Abigail Miyamoto, MD;  Location: [facility] OR;  Service: General;  Laterality: Right;   TEE WITHOUT CARDIOVERSION N/A 12/12/2020   Procedure: TRANSESOPHAGEAL ECHOCARDIOGRAM (TEE);  Surgeon: Quintella Reichert, MD;  Location: [facility] ENDOSCOPY;  Service: Cardiovascular;  Laterality: N/A;   TOTAL ABDOMINAL HYSTERECTOMY  1963   for cervical cancer in situ     A IV Location/Drains/Wounds Patient Lines/Drains/Airways Status     Active Line/Drains/Airways     Name Placement date Placement time Site Days   Peripheral IV 05/12/23 22 G Left;Posterior Forearm 05/12/23  2128  Forearm  1   Peripheral IV 05/13/23 22 G Anterior;Proximal;Right Antecubital 05/13/23  0355  Antecubital  less than 1            Intake/Output Last  24 hours  Intake/Output Summary (Last 24 hours) at 05/13/2023 4098 Last data filed at 05/12/2023 2127 Gross per 24 hour  Intake 100 ml  Output --  Net 100 ml    Labs/Imaging Results for orders placed or performed during the hospital encounter of 05/12/23 (from the past 48 hour(s))  CBG monitoring, ED     Status: None   Collection Time: 05/12/23 11:02 PM  Result Value Ref Range   Glucose-Capillary 94 70 - 99 mg/dL    Comment: Glucose reference range applies only to  samples taken after fasting for at least 8 hours.  Comprehensive metabolic panel     Status: Abnormal   Collection Time: 05/12/23 11:10 PM  Result Value Ref Range   Sodium 133 (L) 135 - 145 mmol/L   Potassium 3.2 (L) 3.5 - 5.1 mmol/L   Chloride 111 98 - 111 mmol/L   CO2 12 (L) 22 - 32 mmol/L   Glucose, Bld 89 70 - 99 mg/dL    Comment: Glucose reference range applies only to samples taken after fasting for at least 8 hours.   BUN 55 (H) 8 - 23 mg/dL   Creatinine, Ser 1.19 (H) 0.44 - 1.00 mg/dL   Calcium 6.0 (LL) 8.9 - 10.3 mg/dL    Comment: CRITICAL RESULT CALLED TO, READ BACK BY AND VERIFIED WITH Cathlyn Parsons, RN. [phone] 05/13/23.LPAIT   Total Protein 4.1 (L) 6.5 - 8.1 g/dL   Albumin 2.2 (L) 3.5 - 5.0 g/dL   AST 28 15 - 41 U/L   ALT 12 0 - 44 U/L   Alkaline Phosphatase 51 38 - 126 U/L   Total Bilirubin 1.1 0.3 - 1.2 mg/dL   GFR, Estimated 43 (L) >60 mL/min    Comment: (NOTE) Calculated using the CKD-EPI Creatinine Equation (2021)    Anion gap 10 5 - 15    Comment: Performed at [facility], [address] [place], [place] [address]  CBC     Status: Abnormal   Collection Time: 05/12/23 11:10 PM  Result Value Ref Range   WBC 7.4 4.0 - 10.5 K/uL   RBC 2.37 (L) 3.87 - 5.11 MIL/uL   Hemoglobin 7.5 (L) 12.0 - 15.0 g/dL   HCT 13.0 (L) 86.5 - 78.4 %   MCV 99.6 80.0 - 100.0 fL   MCH 31.6 26.0 - 34.0 pg   MCHC 31.8 30.0 - 36.0 g/dL   RDW 69.6 29.5 - 28.4 %   Platelets 103 (L) 150 - 400 K/uL   nRBC 0.0 0.0 - 0.2 %    Comment: Performed at [facility], [address] [place], [place] [address]  Troponin I (High Sensitivity)     Status: None   Collection Time: 05/12/23 11:10 PM  Result Value Ref Range   Troponin I (High Sensitivity) 13 <18 ng/L    Comment: (NOTE) Elevated high sensitivity troponin I (hsTnI) values and significant  changes across serial measurements may suggest ACS but many other  chronic and acute conditions are known to elevate hsTnI results.  Refer to  the "Links" section for chest pain algorithms and additional  guidance. Performed at [facility], [address] [place], [place] [address]   POC occult blood, ED     Status: None   Collection Time: 05/12/23 11:47 PM  Result Value Ref Range   Fecal Occult Bld NEGATIVE NEGATIVE  Type and screen [facility]     Status: None   Collection Time:  05/13/23 12:03 AM  Result Value Ref Range   ABO/RH(D) B POS    Antibody Screen NEG    Sample Expiration      05/16/2023,2359 Performed at [facility], [address], [place], [place] [address]   ABO/Rh     Status: None   Collection Time: 05/13/23 12:08 AM  Result Value Ref Range   ABO/RH(D)      B POS Performed at [facility], [address], [place], [place] [address]   Troponin I (High Sensitivity)     Status: None   Collection Time: 05/13/23  1:00 AM  Result Value Ref Range   Troponin I (High Sensitivity) 9 <18 ng/L    Comment: (NOTE) Elevated high sensitivity troponin I (hsTnI) values and significant  changes across serial measurements may suggest ACS but many other  chronic and acute conditions are known to elevate hsTnI results.  Refer to the "Links" section for chest pain algorithms and additional  guidance. Performed at [facility], [address] [place], [place] [address]   Urinalysis, Routine w reflex microscopic -Urine, Clean Catch     Status: None   Collection Time: 05/13/23  1:36 AM  Result Value Ref Range   Color, Urine YELLOW YELLOW   APPearance CLEAR CLEAR   Specific Gravity, Urine 1.019 1.005 - 1.030   pH 5.0 5.0 - 8.0   Glucose, UA NEGATIVE NEGATIVE mg/dL   Hgb urine dipstick NEGATIVE NEGATIVE   Bilirubin Urine NEGATIVE NEGATIVE   Ketones, ur NEGATIVE NEGATIVE mg/dL   Protein, ur NEGATIVE NEGATIVE mg/dL   Nitrite NEGATIVE NEGATIVE   Leukocytes,Ua NEGATIVE NEGATIVE    Comment: Performed at [facility], [address], [place], [place] [address]  Hemoglobin  and hematocrit, blood     Status: Abnormal   Collection Time: 05/13/23  1:44 AM  Result Value Ref Range   Hemoglobin 9.8 (L) 12.0 - 15.0 g/dL    Comment: REPEATED TO VERIFY POST TRANSFUSION SPECIMEN    HCT 29.8 (L) 36.0 - 46.0 %    Comment: Performed at [facility], [address], [place], [place] [address]   CT ABDOMEN PELVIS W CONTRAST  Result Date: 05/13/2023 CLINICAL DATA:  Anemia and back pain EXAM: CT ABDOMEN AND PELVIS WITH CONTRAST TECHNIQUE: Multidetector CT imaging of the abdomen and pelvis was performed using the standard protocol following bolus administration of intravenous contrast. RADIATION DOSE REDUCTION: This exam was performed according to the departmental dose-optimization program which includes automated exposure control, adjustment of the mA and/or kV according to patient size and/or use of iterative reconstruction technique. CONTRAST:  75mL OMNIPAQUE IOHEXOL 350 MG/ML SOLN COMPARISON:  MRI of the abdomen 03/25/2018 FINDINGS: Lower chest: No acute finding. Atherosclerosis and mitral annular calcification. Symmetric heterogeneity of breast parenchyma with multiple coarse calcifications, pattern is stable from 2021. Hepatobiliary: No focal liver abnormality.Intra and extrahepatic biliary dilatation with CBD measuring up to 15 mm. No calcified stone. Pancreas: Unremarkable. Spleen: Unremarkable. Adrenals/Urinary Tract: Negative adrenals. No hydronephrosis or stone. Lateral extending bladder diverticulum on both sides without internal filling defect, currently distended to a 4.2 cm on the right. Stomach/Bowel: No obstruction. No visible bowel inflammation. Left colonic diverticulosis. Vascular/Lymphatic: Extensive atheromatous plaque involving the aorta with narrow distal aorta and common iliac arteries. No mass or adenopathy. Reproductive:Hysterectomy. Other: No ascites or pneumoperitoneum. Musculoskeletal: No acute abnormalities. Generalized spinal degeneration with scoliosis.  Subjective osteopenia. IMPRESSION: 1. No acute finding or specific cause for anemia.  2. Longstanding biliary dilatation to the distal CBD. 3. Advanced atherosclerosis. Electronically Signed   By: Tiburcio Pea M.D.   On: 05/13/2023 04:16   CT HEAD WO CONTRAST ( )  Result Date: 05/12/2023 CLINICAL DATA:  Mental status change EXAM: CT HEAD WITHOUT CONTRAST TECHNIQUE: Contiguous axial images were obtained from the base of the skull through the vertex without intravenous contrast. RADIATION DOSE REDUCTION: This exam was performed according to the departmental dose-optimization program which includes automated exposure control, adjustment of the mA and/or kV according to patient size and/or use of iterative reconstruction technique. COMPARISON:  MRI head 12/16/2019 and CT head 08/14/2014 FINDINGS: Brain: No intracranial hemorrhage, mass effect, or evidence of acute infarct. No hydrocephalus. No extra-axial fluid collection. Generalized cerebral atrophy. Ill-defined hypoattenuation within the cerebral white matter is nonspecific but consistent with chronic small vessel ischemic disease. Vascular: No hyperdense vessel. Intracranial arterial calcification. Skull: No fracture or focal lesion. Sinuses/Orbits: No acute finding. Mucosal thickening left maxillary sinus. Paranasal sinuses and mastoid air cells are otherwise well aerated. Other: None. IMPRESSION: 1. No acute intracranial abnormality. 2. Chronic small vessel ischemic disease and cerebral atrophy. Electronically Signed   By: Minerva Fester M.D.   On: 05/12/2023 23:11   DG Chest Port 1 View  Result Date: 05/12/2023 CLINICAL DATA:  Chest pain and back pain EXAM: PORTABLE CHEST 1 VIEW COMPARISON:  CT chest 08/10/2020 and radiograph [id number] FINDINGS: Stable cardiomediastinal silhouette. Aortic atherosclerotic calcification. No focal consolidation, pleural effusion, or pneumothorax. No displaced rib fractures. Remote left rib fractures. IMPRESSION: No  active disease. Electronically Signed   By: Minerva Fester M.D.   On: 05/12/2023 22:46    Pending Labs Unresulted Labs (From admission, onward)     Start     Ordered   05/13/23 0700  Hemoglobin and hematocrit, blood  Now then every 8 hours,   R (with TIMED occurrences)      05/13/23 0503   05/13/23 0635  CEA  Once,   R        05/13/23 0634   05/13/23 0633  CK  Once,   R        05/13/23 0632   05/13/23 0507  Magnesium  Once,   R        05/13/23 0506   05/13/23 0507  Protime-INR  Once,   R        05/13/23 0506   05/13/23 0506  Vitamin B12  (Anemia Panel (PNL))  Once,   R        05/13/23 0505   05/13/23 0506  Basic metabolic panel  Once,   R        05/13/23 0505   05/13/23 0506  Calcium, ionized  Once,   R        05/13/23 0505   05/13/23 0505  Ferritin  (Anemia Panel (PNL))  Once,   R        05/13/23 0505   05/13/23 0505  Folate  (Anemia Panel (PNL))  Once,   R        05/13/23 0505   05/13/23 0505  Iron and TIBC  (Anemia Panel (PNL))  Once,   R        05/13/23 0505   05/13/23 0505  Reticulocytes  (Anemia Panel (PNL))  Once,   R        05/13/23 0505            Vitals/Pain Today's Vitals   05/13/23 0530 05/13/23 0544 05/13/23 0600 05/13/23 5409  BP:  (!) 120/41 (!) 122/49   Pulse: 82 82 64   Resp: (!) 30 16 15    Temp:      TempSrc:      SpO2: 99% 99% 100%   Weight:    37 kg  Height:    4\' 7"  (1.397 m)  PainSc:        Isolation Precautions No active isolations  Medications Medications  nicotine (NICODERM CQ - dosed in mg/24 hours) patch 14 mg (has no administration in time range)  pantoprazole (PROTONIX) injection 40 mg (has no administration in time range)  acetaminophen (TYLENOL) tablet 650 mg (has no administration in time range)    Or  acetaminophen (TYLENOL) suppository 650 mg (has no administration in time range)  lactated ringers bolus 500 mL (500 mLs Intravenous Bolus 05/12/23 2315)  iohexol (OMNIPAQUE) 350 MG/ML injection 75 mL (75 mLs Intravenous  Contrast Given 05/13/23 0357)    Mobility walks     Focused Assessments Cardiac Assessment Handoff:  Cardiac Rhythm: Normal sinus rhythm Lab Results  Component Value Date   TROPONINI <0.03 03/25/2018   No results found for: "DDIMER" Does the Patient currently have chest pain? No    R Recommendations: See Admitting Provider Note  Report given to:   Additional Notes: .

## 2023-05-13 NOTE — H&P (Signed)
PCP:   Philip Aspen, Limmie Patricia, MD   Chief Complaint:  Altered mentation  HPI: This 89 year old female with moderate dementia, she lives alone.  She has history of CAD, diabetes mellitus, HLD, HTN, trigeminal neuralgia.  Per son at bedside, he is not sure if anyone checked under the past few days.  Today family visited and noted she was not herself.  The house and the patient was really warm.  The [facility] broken and the temp was 85 degrees in the house.  Patient appeared tired, listless and more confused than baseline.  She was taken to the families home with a working [facility]  and watched. She did not return to baseline so she was taken to the ER.  At baseline patient is able to do her ADLs and her medications are are arranged in a 2 week medication dispenser  arranged by family.  Patient has done well with this so far. Patient is able to say her stool is brown.  Patient's oves spicy foods, does not use NSAIDs.  Her appetite is poor especially when eating by herself.  Patient with occasional GERD.  In the ER patient's hemoglobin 7.5, previous hemoglobin 15.1 (04/24/2023). Creatinine 1.2, previous 1.02.  Calcium <6.  Occult stool negative.  Repeat H&H done for confirmation showed hemoglobin 9.8.  In the ER patient remains somewhat confused compared to baseline  Son at bedside providing history.  Review of Systems:  Per HPI masses.  Past Medical History: Past Medical History:  Diagnosis Date   At risk for medication noncompliance    CAD (coronary artery disease)    a. borderline disease by CT 08/2018.   Carotid artery disease (HCC)    1-39% bilateral carotid stenosis by dopplers 12/2022   Diabetes mellitus, type 2 (HCC)    History of cervical cancer    Hyperlipidemia    Hypertension    Mitral regurgitation    severe MR by echo 10/2020 // TEE 1/22: EF 60-65, normal RVSF, mild LAE, mod MR.  Moderate to severe MR by echo 11/2021   Noncompliance with medication treatment due to underuse of medication     Raynaud's phenomenon    TN (trigeminal neuralgia)    Past Surgical History:  Procedure Laterality Date   BREAST BIOPSY     BREAST ENHANCEMENT SURGERY     CHOLECYSTECTOMY N/A 02/14/2014   Procedure: LAPAROSCOPIC CHOLECYSTECTOMY;  Surgeon: Axel Filler, MD;  Location: [facility] OR;  Service: General;  Laterality: N/A;   ERCP N/A 03/08/2015   Procedure: ENDOSCOPIC RETROGRADE CHOLANGIOPANCREATOGRAPHY (ERCP);  Surgeon: Meryl Dare, MD;  Location: [facility];  Service: Endoscopy;  Laterality: N/A;   MASS EXCISION Right 12/25/2016   Procedure: EXCISION RIGHT SCALP MASS SEBACEOUS CYST;  Surgeon: Abigail Miyamoto, MD;  Location: [facility] OR;  Service: General;  Laterality: Right;   TEE WITHOUT CARDIOVERSION N/A 12/12/2020   Procedure: TRANSESOPHAGEAL ECHOCARDIOGRAM (TEE);  Surgeon: Quintella Reichert, MD;  Location: [facility] ENDOSCOPY;  Service: Cardiovascular;  Laterality: N/A;   TOTAL ABDOMINAL HYSTERECTOMY  1963   for cervical cancer in situ    Medications: Prior to Admission medications   Medication Sig Start Date End Date Taking? Authorizing Provider  ASPIRIN LOW DOSE 81 MG tablet TAKE ONE TABLET BY MOUTH DAILY AT BEDTIME. 04/29/23   Philip Aspen, Limmie Patricia, MD  atorvastatin (LIPITOR) 80 MG tablet Take 1 tablet (80 mg total) by mouth daily. 12/11/22   Quintella Reichert, MD  cholecalciferol (VITAMIN D3) 25 MCG (1000 UNIT) tablet Take 1,000 Units  by mouth daily.    [provider]  donepezil (ARICEPT) 10 MG tablet Take 1 tablet daily 12/30/22   Van Clines, MD  mirtazapine (REMERON) 30 MG tablet TAKE 1 TABLET (30 MG TOTAL) BY MOUTH AT BEDTIME. 03/10/23   Philip Aspen, Limmie Patricia, MD  Multiple Vitamin (MULTIVITAMIN) tablet Take 1 tablet by mouth daily.    [provider]  nitroGLYCERIN (NITROSTAT) 0.4 MG SL tablet PLACE 1 TABLET (0.4 MG TOTAL) UNDER THE TONGUE EVERY 5 (FIVE) MINUTES AS NEEDED FOR CHEST PAIN. 08/25/20   Tereso Newcomer T, PA-C    Allergies:   Allergies  Allergen  Reactions   Morphine And Codeine Itching    Short lived, mild itching.    Social History:  reports that she has been smoking cigarettes. She has a 90.00 pack-year smoking history. She has never used smokeless tobacco. She reports that she does not drink alcohol and does not use drugs.  Family History: Family History  Problem Relation Age of Onset   CAD Neg Hx    Diabetes Mellitus II Neg Hx     Physical Exam: Vitals:   05/12/23 2330 05/13/23 0118 05/13/23 0200 05/13/23 0215  BP: (!) 171/46  (!) 161/58 (!) 143/59  Pulse: (!) 106  62 70  Resp: 15  (!) 25 20  Temp:  98 F (36.7 C)    TempSrc:  Oral    SpO2: 97%  97% 96%    General: Alert and oriented x 3 but somewhat confused, in no acute distress.  Sleeping but arousable Eyes: No scleral icterus ENT: Moist oral mucosa, neck supple, no thyromegaly Lungs: clear to ascultation, no wheeze, no crackles, no use of accessory muscles Cardiovascular: Cardia, regular rate and rhythm. No carotid bruits, no JVD Abdomen: soft, positive BS, non-tender, non-distended, no organomegaly, not an acute abdomen GU: not examined Neuro: CN II - XII grossly intact. Musculoskeletal: strength 5/5 all extremities, no edema Skin: no rash, no subcutaneous crepitation, no decubitus Psych: Somewhat confused   Labs on Admission:  Recent Labs    05/12/23 2310  NA 133*  K 3.2*  CL 111  CO2 12*  GLUCOSE 89  BUN 55*  CREATININE 1.21*  CALCIUM 6.0*   Recent Labs    05/12/23 2310  AST 28  ALT 12  ALKPHOS 51  BILITOT 1.1  PROT 4.1*  ALBUMIN 2.2*    Recent Labs    05/12/23 2310  WBC 7.4  HGB 7.5*  HCT 23.6*  MCV 99.6  PLT 103*    Radiological Exams on Admission: CT HEAD WO CONTRAST ( )  Result Date: 05/12/2023 CLINICAL DATA:  Mental status change EXAM: CT HEAD WITHOUT CONTRAST TECHNIQUE: Contiguous axial images were obtained from the base of the skull through the vertex without intravenous contrast. RADIATION DOSE REDUCTION: This  exam was performed according to the departmental dose-optimization program which includes automated exposure control, adjustment of the mA and/or kV according to patient size and/or use of iterative reconstruction technique. COMPARISON:  MRI head 12/16/2019 and CT head 08/14/2014 FINDINGS: Brain: No intracranial hemorrhage, mass effect, or evidence of acute infarct. No hydrocephalus. No extra-axial fluid collection. Generalized cerebral atrophy. Ill-defined hypoattenuation within the cerebral white matter is nonspecific but consistent with chronic small vessel ischemic disease. Vascular: No hyperdense vessel. Intracranial arterial calcification. Skull: No fracture or focal lesion. Sinuses/Orbits: No acute finding. Mucosal thickening left maxillary sinus. Paranasal sinuses and mastoid air cells are otherwise well aerated. Other: None. IMPRESSION: 1. No acute intracranial abnormality. 2.  Chronic small vessel ischemic disease and cerebral atrophy. Electronically Signed   By: Minerva Fester M.D.   On: 05/12/2023 23:11   DG Chest Port 1 View  Result Date: 05/12/2023 CLINICAL DATA:  Chest pain and back pain EXAM: PORTABLE CHEST 1 VIEW COMPARISON:  CT chest 08/10/2020 and radiograph [id number] FINDINGS: Stable cardiomediastinal silhouette. Aortic atherosclerotic calcification. No focal consolidation, pleural effusion, or pneumothorax. No displaced rib fractures. Remote left rib fractures. IMPRESSION: No active disease. Electronically Signed   By: Minerva Fester M.D.   On: 05/12/2023 22:46    Assessment/Plan Present on Admission:  Anemia -Unclear etiology.  N.p.o., H&H every 8, Protonix every 12, repeat occult stool -CT abdomen pelvis ordered, CEA ordered -GI consulted based on, repeat occult stool -Anemia panel ordered including vitamin B12 and folate acid  Heat exposure/Acute metabolic encephalopathy in patient with baseline dementia -Mentation likely worsened due to heat exposure and dehydration -Continue  to monitor and hydrate. -Rule out infection, UA and chest x-ray both negative.  WBC normal.  Patient afebrile -CK ordered -Remeron on hold  Hypocalcemia, thrombocytopenia -Repeat CBC and BMP ordered -Ionized calcium ordered  Dyslipidemia -Statin on hold   Tobacco abuse -Nicotine patch ordered  Airanna Partin 05/13/2023, 2:45 AM

## 2023-05-14 DIAGNOSIS — G9341 Metabolic encephalopathy: Secondary | ICD-10-CM | POA: Diagnosis not present

## 2023-05-14 LAB — CBC
HCT: 39.9 % (ref 36.0–46.0)
Hemoglobin: 13.3 g/dL (ref 12.0–15.0)
MCH: 31.1 pg (ref 26.0–34.0)
MCHC: 33.3 g/dL (ref 30.0–36.0)
MCV: 93.2 fL (ref 80.0–100.0)
Platelets: 225 10*3/uL (ref 150–400)
RBC: 4.28 MIL/uL (ref 3.87–5.11)
RDW: 13.4 % (ref 11.5–15.5)
WBC: 8.9 10*3/uL (ref 4.0–10.5)
nRBC: 0 % (ref 0.0–0.2)

## 2023-05-14 LAB — BASIC METABOLIC PANEL
Anion gap: 8 (ref 5–15)
BUN: 34 mg/dL — ABNORMAL HIGH (ref 8–23)
CO2: 19 mmol/L — ABNORMAL LOW (ref 22–32)
Calcium: 7.7 mg/dL — ABNORMAL LOW (ref 8.9–10.3)
Chloride: 112 mmol/L — ABNORMAL HIGH (ref 98–111)
Creatinine, Ser: 1.15 mg/dL — ABNORMAL HIGH (ref 0.44–1.00)
GFR, Estimated: 46 mL/min — ABNORMAL LOW (ref >60)
Glucose, Bld: 127 mg/dL — ABNORMAL HIGH (ref 70–99)
Potassium: 3.6 mmol/L (ref 3.5–5.1)
Sodium: 139 mmol/L (ref 135–145)

## 2023-05-14 NOTE — Evaluation (Signed)
Physical Therapy Evaluation Patient Details Name: [name] MRN: [id number] DOB: 1934/07/14 Today's Date: 05/14/2023  History of Present Illness  Pt is a 89 y.o. F who presents 05/12/2023 with anemia of unclear etiology and heat exposure/acute metabolic encephalopathy. Significant PMH: CAD, DM, HLD, HTN, trigeminal neuralgia.   Clinical Impression  PTA pt reports living alone with independence for functional mobility and ADL's, confirmed by niece in room and son by phone. Today, pt w min guard to min A for functional mobility, reduced gait speed with deficits in dynamic and static balance using 1 person HHA. RW introduced with improved gait speed, step length, and static/dynamic balance. Pt amb 100' with increased time for turning and min verbal cues for RW management. Pt w brief episode of BM incontinence, unaware to necessity for use. Pt will continue to benefit from skilled PT during their acute admission to facilitate improvements in gait training, stairs, balance, and all functional mobility and endurance. Current PT rec is HHPT with youth RW for discharge. Spoke w son on phone briefly to discuss rec for increased supervision at this time for mobility and ADL's/IADL's.     Recommendations for follow up therapy are one component of a multi-disciplinary discharge planning process, led by the attending physician.  Recommendations may be updated based on patient status, additional functional criteria and insurance authorization.  Follow Up Recommendations       Assistance Recommended at Discharge Frequent or constant Supervision/Assistance  Patient can return home with the following  A little help with walking and/or transfers;A little help with bathing/dressing/bathroom;Assistance with cooking/housework;Direct supervision/assist for medications management;Direct supervision/assist for financial management;Assist for transportation;Help with stairs or ramp for entrance     Equipment Recommendations Rolling walker (2 wheels) (youth RW)  Recommendations for Other Services       Functional Status Assessment Patient has had a recent decline in their functional status and demonstrates the ability to make significant improvements in function in a reasonable and predictable amount of time.     Precautions / Restrictions Precautions Precautions: Fall Precaution Comments: bowel incontinence Restrictions Weight Bearing Restrictions: No      Mobility  Bed Mobility                    Transfers Overall transfer level: Needs assistance Equipment used: 1 person hand held assist Transfers: Sit to/from Stand Sit to Stand: Min assist           General transfer comment: Decreased anterior weight shifting, presents with posterior lean. 2x attempts to complete stand with HHA and min A    Ambulation/Gait Ambulation/Gait assistance: Min guard   Assistive device: Rolling walker (2 wheels), 1 person hand held assist Gait Pattern/deviations: Step-through pattern, Decreased stride length, Narrow base of support Gait velocity: reduced Gait velocity interpretation: <1.31 ft/sec, indicative of household ambulator   General Gait Details: With increased lateral sway using HHA, RW introduced and results w increased gait speed, balance, and improved in session activity tolerance. Needs cueing for RW proximity and placement, able to correct but forgets quickly  Stairs            Wheelchair Mobility    Modified Rankin (Stroke Patients Only)       Balance Overall balance assessment: Needs assistance Sitting-balance support: No upper extremity supported, Feet supported Sitting balance-Leahy Scale: Good Sitting balance - Comments: Can scoot EOB without assist using BUE Postural control: Posterior lean Standing balance support: Single extremity supported, Bilateral upper extremity supported, During functional activity, Reliant on  assistive device for  balance Standing balance-Leahy Scale: Fair Standing balance comment: Wobbly with HHA, posterior lean but corrects with verbal and tactile cueing. Improved static and dynamic balance with RW introduced. Stands at sink unsupported to wash hands                             Pertinent Vitals/Pain Pain Assessment Pain Assessment: No/denies pain    Home Living Family/patient expects to be discharged to:: Private residence Living Arrangements: Alone Available Help at Discharge: Family;Friend(s) Type of Home: House Home Access: Stairs to enter   [employer] of Steps: 4   Home Layout: One level   Additional Comments: Info gathered from pt, decreased memory so possible inconsistency with answering. Pt son available by phone for additional home setup    Prior Function Prior Level of Function : Independent/Modified Independent             Mobility Comments: Reports ind with no AD, niece in room confirms       Hand Dominance        Extremity/Trunk Assessment   Upper Extremity Assessment Upper Extremity Assessment: Overall WFL for tasks assessed    Lower Extremity Assessment Lower Extremity Assessment: Overall WFL for tasks assessed;RLE deficits/detail;LLE deficits/detail RLE Deficits / Details: Same as LLE LLE Deficits / Details: MMT at least 4/5 for hip flexion, knee ext/flex, DF/PF    Cervical / Trunk Assessment Cervical / Trunk Assessment: Kyphotic  Communication   Communication: Prefers language other than English;No difficulties (Pt able to understand and speak english, speaks to niece in room in another language but niece interprets as well)  Cognition Arousal/Alertness: Awake/alert Behavior During Therapy: WFL for tasks assessed/performed Overall Cognitive Status: Impaired/Different from baseline Area of Impairment: Orientation, Following commands, Safety/judgement, Memory, Problem solving                 Orientation Level: Disoriented  to, Time   Memory: Decreased short-term memory Following Commands: Follows one step commands with increased time, Follows multi-step commands inconsistently Safety/Judgement: Decreased awareness of safety, Decreased awareness of deficits   Problem Solving: Difficulty sequencing, Requires verbal cues, Requires tactile cues          General Comments General comments (skin integrity, edema, etc.): Episode of BM incontinence post amb. Pt reports not knowing they had to use the bathroom.    Exercises     Assessment/Plan    PT Assessment Patient needs continued PT services  PT Problem List Decreased activity tolerance;Decreased balance;Decreased mobility;Decreased cognition;Decreased safety awareness       PT Treatment Interventions DME instruction;Stair training;Gait training;Functional mobility training;Balance training;Cognitive remediation;Patient/family education    PT Goals (Current goals can be found in the Care Plan section)  Acute Rehab PT Goals Patient Stated Goal: to go home PT Goal Formulation: With patient/family Time For Goal Achievement: 05/27/23 Potential to Achieve Goals: Good    Frequency Min 3X/week     Co-evaluation               AM-PAC PT "6 Clicks" Mobility  Outcome Measure Help needed turning from your back to your side while in a flat bed without using bedrails?: None Help needed moving from lying on your back to sitting on the side of a flat bed without using bedrails?: None Help needed moving to and from a bed to a chair (including a wheelchair)?: A Little Help needed standing up from a chair using your arms (e.g., wheelchair or  bedside chair)?: A Little Help needed to walk in hospital room?: A Little Help needed climbing 3-5 steps with a railing? : A Little 6 Click Score: 20    End of Session Equipment Utilized During Treatment: Gait belt Activity Tolerance: Patient tolerated treatment well Patient left: in chair;with call bell/phone  within reach;with chair alarm set Nurse Communication: Mobility status PT Visit Diagnosis: Unsteadiness on feet (R26.81);Other abnormalities of gait and mobility (R26.89);Difficulty in walking, not elsewhere classified (R26.2)    Time: 0810-0905 PT Time Calculation (min) (ACUTE ONLY): 55 min   Charges:   PT Evaluation $PT Eval Low Complexity: 1 Low PT Treatments $Gait Training: 8-22 mins $Therapeutic Activity: 23-37 mins        Hendricks Milo, SPT  Acute Rehabilitation Services   Hendricks Milo 05/14/2023, 9:39 AM

## 2023-05-14 NOTE — Discharge Summary (Signed)
Physician Discharge Summary  Neeli Whitling ZOX:[id number] DOB: [birth year] DOA: 05/12/2023  PCP: Philip Aspen, Limmie Patricia, MD  Admit date: 05/12/2023 Discharge date: 05/14/2023  Admitted From: Home Disposition:  Home  Discharge Condition:Stable CODE STATUS:FULL Diet recommendation: Heart Healthy  Brief/Interim Summary: Patient is 89 year old female with history of dementia, coronary artery  disease, diabetes type 2, hyperlipidemia, hypertension who lives alone brought to the emergency department for the evaluation of altered mentation.  Patient appeared tired, confused than baseline.  On normal circumstances, she is able to do her ADLs.  On presentation hemoglobin was 7.5.  Hemoglobin was 15.1 as for 04/24/2023.  Lab work showed creatinine 1.2.  FOBT negative.Patient admitted for further evaluation.  Hospital course remained stable.  Her hemoglobin was found to be stable in the range of 13 on repeat test.  Patient seen by PT and recommended home health on discharge.  Medically stable for discharge to home tooday  Following problems were addressed during the hospitalization:  Normocytic anemia: No history of passage of melena or hematochezia.  FOBT negative.  Initial hemoglobin was 7.5 but repeat hemoglobin was found to be 9.8 which further improved to 13.2.  Will continue to monitor hemoglobin.  Continue Protonix.  CT abdomen/pelvis did not show any acute findings. Optimal vitamin B12, folic acid level. The earlier hemoglobin of 7.5 could be an error.  Repeat Hb is stable  Altered mentation on the background of dementia: Looked dehydrated on presentation.  Also found to be inside hot room.  Started on IV fluids.  No leukocytosis or fever.   UA notes history of UTI, chest x-ray did not show pneumonia. CT head did not show any acute findings.  Currently mentation is baseline  AKI : Baseline creatinine normal .creatinine currently in the range of 1.4.  Resolved with  fluids  Hypocalcemia/thrombocytopenia: Continue to monitor.  Calcium supplemented  Debility/deconditioning: PT recommended home health   discharge Diagnoses:  Principal Problem:   Acute metabolic encephalopathy Active Problems:   Dyslipidemia   Essential hypertension   Tobacco abuse   Diabetes mellitus type 2 in nonobese (HCC)   Anemia   Dementia without behavioral disturbance [facility])    Discharge Instructions  Discharge Instructions     Diet - low sodium heart healthy   Complete by: As directed    Discharge instructions   Complete by: As directed    1)Please follow up with your PCP in a week   Increase activity slowly   Complete by: As directed       Allergies as of 05/14/2023       Reactions   Morphine And Codeine Itching   Short lived, mild itching.        Medication List     TAKE these medications    Aspirin Low Dose 81 MG tablet Generic drug: aspirin EC TAKE ONE TABLET BY MOUTH DAILY AT BEDTIME. What changed: how much to take   atorvastatin 80 MG tablet Commonly known as: LIPITOR Take 1 tablet (80 mg total) by mouth daily.   cholecalciferol 25 MCG (1000 UNIT) tablet Commonly known as: VITAMIN D3 Take 1,000 Units by mouth daily.   donepezil 10 MG tablet Commonly known as: ARICEPT Take 1 tablet daily What changed:  how much to take how to take this when to take this additional instructions   mirtazapine 30 MG tablet Commonly known as: REMERON TAKE 1 TABLET (30 MG TOTAL) BY MOUTH AT BEDTIME.   multivitamin tablet Take 1 tablet by mouth daily.  nitroGLYCERIN 0.4 MG SL tablet Commonly known as: NITROSTAT PLACE 1 TABLET (0.4 MG TOTAL) UNDER THE TONGUE EVERY 5 (FIVE) MINUTES AS NEEDED FOR CHEST PAIN.               Durable Medical Equipment  (From admission, onward)           Start     Ordered   05/14/23 1019  For home use only DME Walker rolling  Once       Comments: Youth  Question Answer Comment  Walker: With 5 Inch  Wheels   Patient needs a walker to treat with the following condition Weakness      05/14/23 1019            Allergies  Allergen Reactions   Morphine And Codeine Itching    Short lived, mild itching.    Consultations: None   Procedures/Studies: CT ABDOMEN PELVIS W CONTRAST  Result Date: 05/13/2023 CLINICAL DATA:  Anemia and back pain EXAM: CT ABDOMEN AND PELVIS WITH CONTRAST TECHNIQUE: Multidetector CT imaging of the abdomen and pelvis was performed using the standard protocol following bolus administration of intravenous contrast. RADIATION DOSE REDUCTION: This exam was performed according to the departmental dose-optimization program which includes automated exposure control, adjustment of the mA and/or kV according to patient size and/or use of iterative reconstruction technique. CONTRAST:  75mL OMNIPAQUE IOHEXOL 350 MG/ML SOLN COMPARISON:  MRI of the abdomen 03/25/2018 FINDINGS: Lower chest: No acute finding. Atherosclerosis and mitral annular calcification. Symmetric heterogeneity of breast parenchyma with multiple coarse calcifications, pattern is stable from 2021. Hepatobiliary: No focal liver abnormality.Intra and extrahepatic biliary dilatation with CBD measuring up to 15 mm. No calcified stone. Pancreas: Unremarkable. Spleen: Unremarkable. Adrenals/Urinary Tract: Negative adrenals. No hydronephrosis or stone. Lateral extending bladder diverticulum on both sides without internal filling defect, currently distended to a 4.2 cm on the right. Stomach/Bowel: No obstruction. No visible bowel inflammation. Left colonic diverticulosis. Vascular/Lymphatic: Extensive atheromatous plaque involving the aorta with narrow distal aorta and common iliac arteries. No mass or adenopathy. Reproductive:Hysterectomy. Other: No ascites or pneumoperitoneum. Musculoskeletal: No acute abnormalities. Generalized spinal degeneration with scoliosis. Subjective osteopenia. IMPRESSION: 1. No acute finding or  specific cause for anemia. 2. Longstanding biliary dilatation to the distal CBD. 3. Advanced atherosclerosis. Electronically Signed   By: Tiburcio Pea M.D.   On: 05/13/2023 04:16   CT HEAD WO CONTRAST ( )  Result Date: 05/12/2023 CLINICAL DATA:  Mental status change EXAM: CT HEAD WITHOUT CONTRAST TECHNIQUE: Contiguous axial images were obtained from the base of the skull through the vertex without intravenous contrast. RADIATION DOSE REDUCTION: This exam was performed according to the departmental dose-optimization program which includes automated exposure control, adjustment of the mA and/or kV according to patient size and/or use of iterative reconstruction technique. COMPARISON:  MRI head 12/16/2019 and CT head 08/14/2014 FINDINGS: Brain: No intracranial hemorrhage, mass effect, or evidence of acute infarct. No hydrocephalus. No extra-axial fluid collection. Generalized cerebral atrophy. Ill-defined hypoattenuation within the cerebral white matter is nonspecific but consistent with chronic small vessel ischemic disease. Vascular: No hyperdense vessel. Intracranial arterial calcification. Skull: No fracture or focal lesion. Sinuses/Orbits: No acute finding. Mucosal thickening left maxillary sinus. Paranasal sinuses and mastoid air cells are otherwise well aerated. Other: None. IMPRESSION: 1. No acute intracranial abnormality. 2. Chronic small vessel ischemic disease and cerebral atrophy. Electronically Signed   By: Minerva Fester M.D.   On: 05/12/2023 23:11   DG Chest Port 1 View  Result Date: 05/12/2023 CLINICAL  DATA:  Chest pain and back pain EXAM: PORTABLE CHEST 1 VIEW COMPARISON:  CT chest 08/10/2020 and radiograph [id number] FINDINGS: Stable cardiomediastinal silhouette. Aortic atherosclerotic calcification. No focal consolidation, pleural effusion, or pneumothorax. No displaced rib fractures. Remote left rib fractures. IMPRESSION: No active disease. Electronically Signed   By: Minerva Fester  M.D.   On: 05/12/2023 22:46      Subjective: Patient seen and examined at bedside today.  Hemodynamically stable for discharge.  Discharge plan discussed with son at bedside  Discharge Exam: Vitals:   05/14/23 0348 05/14/23 0752  BP: (!) 132/46 (!) 149/45  Pulse: 70 68  Resp:    Temp: 97.9 F (36.6 C) 98.5 F (36.9 C)  SpO2: 96%    Vitals:   05/13/23 2104 05/13/23 2328 05/14/23 0348 05/14/23 0752  BP: (!) 148/51 (!) 136/48 (!) 132/46 (!) 149/45  Pulse: 67 71 70 68  Resp: 15     Temp: 98 F (36.7 C) (!) 97.4 F (36.3 C) 97.9 F (36.6 C) 98.5 F (36.9 C)  TempSrc: Oral Oral Oral Oral  SpO2: 100% 97% 96%   Weight:      Height:        General: Pt is alert, awake, not in acute distress Cardiovascular: RRR, S1/S2 +, no rubs, no gallops Respiratory: CTA bilaterally, no wheezing, no rhonchi Abdominal: Soft, NT, ND, bowel sounds + Extremities: no edema, no cyanosis    The results of significant diagnostics from this hospitalization (including imaging, microbiology, ancillary and laboratory) are listed below for reference.     Microbiology: No results found for this or any previous visit (from the past 240 hour(s)).   Labs: BNP (last 3 results) No results for input(s): "BNP" in the last 8760 hours. Basic Metabolic Panel: Recent Labs  Lab 05/12/23 2310 05/13/23 0730 05/14/23 0222  NA 133* 133* 139  K 3.2* 3.6 3.6  CL 111 103 112*  CO2 12* 21* 19*  GLUCOSE 89 94 127*  BUN 55* 56* 34*  CREATININE 1.21* 1.43* 1.15*  CALCIUM 6.0* 8.1* 7.7*  MG  --  2.0  --    Liver Function Tests: Recent Labs  Lab 05/12/23 2310  AST 28  ALT 12  ALKPHOS 51  BILITOT 1.1  PROT 4.1*  ALBUMIN 2.2*   No results for input(s): "LIPASE", "AMYLASE" in the last 168 hours. No results for input(s): "AMMONIA" in the last 168 hours. CBC: Recent Labs  Lab 05/12/23 2310 05/13/23 0144 05/13/23 0700 05/14/23 0222  WBC 7.4  --   --  8.9  HGB 7.5* 9.8* 13.2 13.3  HCT 23.6* 29.8*  39.0 39.9  MCV 99.6  --   --  93.2  PLT 103*  --   --  225   Cardiac Enzymes: Recent Labs  Lab 05/13/23 0700  CKTOTAL 61   BNP: Invalid input(s): "POCBNP" CBG: Recent Labs  Lab 05/12/23 2302  GLUCAP 94   D-Dimer No results for input(s): "DDIMER" in the last 72 hours. Hgb A1c No results for input(s): "HGBA1C" in the last 72 hours. Lipid Profile No results for input(s): "CHOL", "HDL", "LDLCALC", "TRIG", "CHOLHDL", "LDLDIRECT" in the last 72 hours. Thyroid function studies No results for input(s): "TSH", "T4TOTAL", "T3FREE", "THYROIDAB" in the last 72 hours.  Invalid input(s): "FREET3" Anemia work up Recent Labs    05/13/23 0730  VITAMINB12 422  FOLATE 10.6  FERRITIN 126  TIBC 199*  IRON 45  RETICCTPCT 1.3   Urinalysis    Component Value Date/Time  COLORURINE YELLOW 05/13/2023 0136   APPEARANCEUR CLEAR 05/13/2023 0136   LABSPEC 1.019 05/13/2023 0136   PHURINE 5.0 05/13/2023 0136   GLUCOSEU NEGATIVE 05/13/2023 0136   HGBUR NEGATIVE 05/13/2023 0136   BILIRUBINUR NEGATIVE 05/13/2023 0136   BILIRUBINUR negtive 12/03/2021 1421   KETONESUR NEGATIVE 05/13/2023 0136   PROTEINUR NEGATIVE 05/13/2023 0136   UROBILINOGEN 0.2 12/03/2021 1421   UROBILINOGEN 0.2 08/02/2014 1139   NITRITE NEGATIVE 05/13/2023 0136   LEUKOCYTESUR NEGATIVE 05/13/2023 0136   Sepsis Labs Recent Labs  Lab 05/12/23 2310 05/14/23 0222  WBC 7.4 8.9   Microbiology No results found for this or any previous visit (from the past 240 hour(s)).  Please note: You were cared for by a hospitalist during your hospital stay. Once you are discharged, your primary care physician will handle any further medical issues. Please note that NO REFILLS for any discharge medications will be authorized once you are discharged, as it is imperative that you return to your primary care physician (or establish a relationship with a primary care physician if you do not have one) for your post hospital discharge needs  so that they can reassess your need for medications and monitor your lab values.    Time coordinating discharge: 40 minutes  SIGNED:   Burnadette Pop, MD  [facility] 05/14/2023, 11:36 AM Pager [phone]  If 7PM-7AM, please contact night-coverage [email or web address] Password [email or web address]

## 2023-05-14 NOTE — TOC Transition Note (Signed)
Transition of Care [facility]) - CM/SW Discharge Note   Patient Details  Name: [name] MRN: [id number] Date of Birth: [birth year]  Transition of Care [facility]) CM/SW Contact:  Harriet Masson, RN Phone Number: 05/14/2023, 11:50 AM   Clinical Narrative:    Spoke to patient regarding transition needs.  Patient lives alone but will have family support.  Orders for walker and home health. Choice offered for home health and DME and patient defers to this RNCM to find highly rated agency. Jermaine with [employer] notified of youth walker order.  Amy with [facility] accepted referral.  Address, Phone number and PCP verified.   Final next level of care: Home w Home Health Services Barriers to Discharge: Barriers Resolved   Patient Goals and CMS Choice CMS Medicare.gov Compare Post Acute Care list provided to:: Patient Choice offered to / list presented to : Patient  Discharge Placement                 home        Discharge Plan and Services Additional resources added to the After Visit Summary for                  DME Arranged: Walker rolling DME Agency: [employer] Date DME Agency Contacted: 05/14/23 Time DME Agency Contacted: 1010 Representative spoke with at DME Agency: Vaughan Basta HH Arranged: OT, PT HH Agency: [facility] Date [facility] Agency Contacted: 05/14/23 Time HH Agency Contacted: 1148 Representative spoke with at [facility] Agency: Amy  Social Determinants of Health (SDOH) Interventions SDOH Screenings   Food Insecurity: No Food Insecurity (05/13/2023)  Housing: Patient Declined (05/13/2023)  Transportation Needs: No Transportation Needs (05/13/2023)  Utilities: Not At Risk (05/13/2023)  Depression (PHQ2-9): Low Risk  (04/07/2023)  Financial Resource Strain: Low Risk  (04/07/2023)  Physical Activity: Insufficiently Active (04/07/2023)  Social Connections: Moderately Integrated (04/07/2023)  Stress: No Stress Concern Present (04/07/2023)  Tobacco Use:  High Risk (05/12/2023)     Readmission Risk Interventions     No data to display

## 2023-05-15 ENCOUNTER — Telehealth: Payer: Self-pay

## 2023-05-15 ENCOUNTER — Ambulatory Visit: Payer: Self-pay

## 2023-05-15 LAB — CEA: CEA: 6.4 ng/mL — ABNORMAL HIGH (ref 0.0–4.7)

## 2023-05-15 NOTE — Chronic Care Management (AMB) (Signed)
   05/15/2023  [name] May 21, 1934 [id number]  Reason for Encounter: Patient is not currently enrolled in the  CCM program. CCM enrollment status updated.   [facility]/Chronic Care Management [phone]

## 2023-05-15 NOTE — Transitions of Care (Post Inpatient/ED Visit) (Signed)
   05/15/2023  Name: [name] MRN: [id number] DOB: 1934-05-30  Today's TOC FU Call Status: Today's TOC FU Call Status:: Unsuccessul Call (1st Attempt) Unsuccessful Call (1st Attempt) Date: 05/15/23  Attempted to reach the patient regarding the most recent Inpatient/ED visit.  Follow Up Plan: Additional outreach attempts will be made to reach the patient to complete the Transitions of Care (Post Inpatient/ED visit) call.     Antionette Fairy, RN,BSN,CCM [facility] Care Management Care Management Community Coordinator Direct Phone: [phone] Toll Free: [phone] Fax: [phone]

## 2023-05-16 ENCOUNTER — Telehealth: Payer: Self-pay

## 2023-05-16 NOTE — Transitions of Care (Post Inpatient/ED Visit) (Signed)
   05/16/2023  Name: Lynnleigh Soden MRN: [id number] DOB: 21-Oct-1934  Today's TOC FU Call Status: Today's TOC FU Call Status:: Unsuccessful Call (3rd Attempt) Unsuccessful Call (3rd Attempt) Date: 05/16/23  Attempted to reach the patient regarding the most recent Inpatient/ED visit.  Follow Up Plan: No further outreach attempts will be made at this time. We have been unable to contact the patient.   Antionette Fairy, RN,BSN,CCM [facility] Care Management Care Management Community Coordinator Direct Phone: [phone] Toll Free: [phone] Fax: [phone]

## 2023-05-16 NOTE — Transitions of Care (Post Inpatient/ED Visit) (Signed)
   05/16/2023  Name: [name] MRN: [id number] DOB: [birth year]  Today's TOC FU Call Status: Today's TOC FU Call Status:: Unsuccessful Call (2nd Attempt) Unsuccessful Call (2nd Attempt) Date: 05/16/23  Attempted to reach the patient regarding the most recent Inpatient/ED visit.  Follow Up Plan: Additional outreach attempts will be made to reach the patient to complete the Transitions of Care (Post Inpatient/ED visit) call.     Antionette Fairy, RN,BSN,CCM [facility] Care Management Care Management Community Coordinator Direct Phone: [phone] Toll Free: [phone] Fax: [phone]

## 2023-05-21 ENCOUNTER — Telehealth: Payer: Self-pay | Admitting: Internal Medicine

## 2023-05-21 DIAGNOSIS — F03B0 Unspecified dementia, moderate, without behavioral disturbance, psychotic disturbance, mood disturbance, and anxiety: Secondary | ICD-10-CM | POA: Diagnosis not present

## 2023-05-21 DIAGNOSIS — K219 Gastro-esophageal reflux disease without esophagitis: Secondary | ICD-10-CM | POA: Diagnosis not present

## 2023-05-21 DIAGNOSIS — E785 Hyperlipidemia, unspecified: Secondary | ICD-10-CM | POA: Diagnosis not present

## 2023-05-21 DIAGNOSIS — D649 Anemia, unspecified: Secondary | ICD-10-CM | POA: Diagnosis not present

## 2023-05-21 DIAGNOSIS — G9341 Metabolic encephalopathy: Secondary | ICD-10-CM | POA: Diagnosis not present

## 2023-05-21 DIAGNOSIS — I779 Disorder of arteries and arterioles, unspecified: Secondary | ICD-10-CM | POA: Diagnosis not present

## 2023-05-21 DIAGNOSIS — E119 Type 2 diabetes mellitus without complications: Secondary | ICD-10-CM | POA: Diagnosis not present

## 2023-05-21 DIAGNOSIS — I2510 Atherosclerotic heart disease of native coronary artery without angina pectoris: Secondary | ICD-10-CM | POA: Diagnosis not present

## 2023-05-21 DIAGNOSIS — G500 Trigeminal neuralgia: Secondary | ICD-10-CM | POA: Diagnosis not present

## 2023-05-21 DIAGNOSIS — I119 Hypertensive heart disease without heart failure: Secondary | ICD-10-CM | POA: Diagnosis not present

## 2023-05-21 NOTE — Telephone Encounter (Signed)
Betsy PT with [facility] is calling and need VO for PT 2x2, 1x2

## 2023-05-21 NOTE — Telephone Encounter (Signed)
Verbal orders given to  betsy-PT [facility]

## 2023-05-21 NOTE — Telephone Encounter (Signed)
Okay for orders? 

## 2023-06-30 ENCOUNTER — Telehealth (HOSPITAL_COMMUNITY): Payer: Self-pay | Admitting: Cardiology

## 2023-06-30 ENCOUNTER — Ambulatory Visit (HOSPITAL_COMMUNITY): Payer: PPO

## 2023-06-30 NOTE — Telephone Encounter (Signed)
Patients son called and cancelled echocardiogram on 06/28/23 and did not wish to reschedule. Order will be removed from the echo WQ. We can reinstate the order if they call back to schedule. Thank you.

## 2023-07-09 ENCOUNTER — Ambulatory Visit: Payer: PPO | Admitting: Neurology

## 2023-09-08 ENCOUNTER — Other Ambulatory Visit: Payer: Self-pay | Admitting: Internal Medicine

## 2023-09-08 DIAGNOSIS — E43 Unspecified severe protein-calorie malnutrition: Secondary | ICD-10-CM

## 2023-10-17 ENCOUNTER — Ambulatory Visit: Payer: PPO | Attending: Cardiology | Admitting: Cardiology

## 2023-10-20 ENCOUNTER — Encounter: Payer: Self-pay | Admitting: Cardiology

## 2023-10-28 ENCOUNTER — Other Ambulatory Visit: Payer: Self-pay | Admitting: Internal Medicine

## 2023-11-28 ENCOUNTER — Other Ambulatory Visit: Payer: Self-pay | Admitting: Cardiology

## 2023-12-01 ENCOUNTER — Telehealth: Payer: Self-pay | Admitting: Internal Medicine

## 2023-12-01 DIAGNOSIS — L600 Ingrowing nail: Secondary | ICD-10-CM

## 2023-12-01 NOTE — Telephone Encounter (Signed)
 Copied from CRM [id number]. Topic: Referral - Question >> Nov 28, 2023  4:43 PM Sonny Dandy B wrote: Reason for CRM: Pt son called to request a referral to a podiatrist,  due to a ingrown toe nail. Please give pt son Rosanne Ashing a all at [phone]

## 2023-12-02 NOTE — Addendum Note (Signed)
 Addended by: Kern Reap B on: 12/02/2023 04:12 PM   Modules accepted: Orders

## 2023-12-02 NOTE — Telephone Encounter (Signed)
 Referral placed, appointment cancelled,  son is aware.

## 2023-12-02 NOTE — Telephone Encounter (Signed)
 Okay to refer?

## 2023-12-04 ENCOUNTER — Ambulatory Visit: Payer: PPO | Admitting: Internal Medicine

## 2023-12-10 ENCOUNTER — Ambulatory Visit: Payer: PPO | Admitting: Internal Medicine

## 2023-12-10 ENCOUNTER — Ambulatory Visit: Payer: PPO | Admitting: Podiatry

## 2023-12-10 ENCOUNTER — Encounter: Payer: Self-pay | Admitting: Podiatry

## 2023-12-10 DIAGNOSIS — M79675 Pain in left toe(s): Secondary | ICD-10-CM | POA: Diagnosis not present

## 2023-12-10 DIAGNOSIS — B351 Tinea unguium: Secondary | ICD-10-CM | POA: Diagnosis not present

## 2023-12-10 DIAGNOSIS — M79674 Pain in right toe(s): Secondary | ICD-10-CM | POA: Diagnosis not present

## 2023-12-10 DIAGNOSIS — L600 Ingrowing nail: Secondary | ICD-10-CM | POA: Diagnosis not present

## 2023-12-10 NOTE — Progress Notes (Signed)
 Subjective:   Patient ID: [name], female   DOB: 89 y.o.   MRN: [id number]   HPI Patient presents with caregiver with thick yellow brittle nailbeds 1-5 both feet that are painful that she cannot take care of of and patient does smoke a pack and a half of cigarettes a day for 60 years with minimal problems   Review of Systems  All other systems reviewed and are negative.       Objective:  Physical Exam Vitals and nursing note reviewed.  Constitutional:      Appearance: She is well-developed.  Pulmonary:     Effort: Pulmonary effort is normal.  Musculoskeletal:        General: Normal range of motion.  Skin:    General: Skin is warm.  Neurological:     Mental Status: She is alert.     Neurovascular status intact muscle strength was found to be diminished range of motion diminished with significant thickness incurvation nailbeds 1-5 both feet with the hallux nails being painful.  Good digital perfusion noted     Assessment:  Thickened mycotic nail infection 1-5 both feet moderately painful     Plan:  H&P reviewed condition debrided nailbeds 1-5 both feet no iatrogenic bleeding reappoint routine care with all questions answered to caregiver

## 2023-12-26 ENCOUNTER — Other Ambulatory Visit: Payer: Self-pay | Admitting: Neurology

## 2023-12-30 NOTE — Telephone Encounter (Signed)
Son called and made appt and would like the release of meds for Pt.

## 2023-12-31 MED ORDER — DONEPEZIL HCL 10 MG PO TABS: ORAL_TABLET | ORAL | 3 refills | Status: DC

## 2024-01-01 ENCOUNTER — Telehealth: Payer: Self-pay | Admitting: Neurology

## 2024-01-01 MED ORDER — DONEPEZIL HCL 10 MG PO TABS: ORAL_TABLET | ORAL | 3 refills | Status: DC

## 2024-01-01 NOTE — Telephone Encounter (Signed)
 Refill sent in for pt.

## 2024-01-01 NOTE — Telephone Encounter (Signed)
 Pharmacy is calling for a new Refill order to be sent for Rx Donepezil  to be sent to [address] [facility]

## 2024-02-17 ENCOUNTER — Other Ambulatory Visit: Payer: Self-pay | Admitting: Internal Medicine

## 2024-02-17 DIAGNOSIS — E43 Unspecified severe protein-calorie malnutrition: Secondary | ICD-10-CM

## 2024-02-19 NOTE — Progress Notes (Unsigned)
   02/19/2024  Patient ID: [name], female   DOB: May 25, 1934, 89 y.o.   MRN: [id number]  Pharmacy Quality Measure Review  This patient is appearing on a report for being at risk of failing the adherence measure for cholesterol (statin) medications this calendar year.   Medication: Atorvastatin 80mg  Last fill date: 02/17/24 for 30 day supply  Insurance report was not up to date. No action needed at this time.   Sherrill Raring, PharmD Clinical Pharmacist [phone]

## 2024-03-11 ENCOUNTER — Ambulatory Visit: Payer: PPO | Admitting: Podiatry

## 2024-04-15 ENCOUNTER — Other Ambulatory Visit: Payer: Self-pay | Admitting: Internal Medicine

## 2024-05-14 ENCOUNTER — Other Ambulatory Visit: Payer: Self-pay | Admitting: Cardiology

## 2024-07-06 ENCOUNTER — Encounter (INDEPENDENT_AMBULATORY_CARE_PROVIDER_SITE_OTHER): Admitting: Family Medicine

## 2024-07-06 NOTE — Progress Notes (Signed)
 erro

## 2024-07-12 ENCOUNTER — Other Ambulatory Visit: Payer: Self-pay | Admitting: Cardiology

## 2024-07-13 ENCOUNTER — Telehealth: Payer: Self-pay | Admitting: Cardiology

## 2024-07-13 MED ORDER — ATORVASTATIN CALCIUM 80 MG PO TABS: 80.0000 mg | ORAL_TABLET | Freq: Every day | ORAL | 0 refills | Status: DC

## 2024-07-13 NOTE — Telephone Encounter (Signed)
 RX sent in

## 2024-07-13 NOTE — Telephone Encounter (Signed)
*  STAT* If patient is at the pharmacy, call can be transferred to refill team.   1. Which medications need to be refilled? (please list name of each medication and dose if known) atorvastatin  (LIPITOR) 80 MG tablet   2. Which pharmacy/location (including street and city if local pharmacy) is medication to be sent to? [facility] - [place], [place] - [address]   3. Do they need a 30 day or 90 day supply? 90

## 2024-07-22 ENCOUNTER — Ambulatory Visit (INDEPENDENT_AMBULATORY_CARE_PROVIDER_SITE_OTHER): Admitting: Family Medicine

## 2024-07-22 ENCOUNTER — Encounter: Payer: Self-pay | Admitting: Family Medicine

## 2024-07-22 VITALS — BP 180/66 | HR 69 | Ht <= 58 in | Wt 81.0 lb

## 2024-07-22 DIAGNOSIS — Z0000 Encounter for general adult medical examination without abnormal findings: Secondary | ICD-10-CM | POA: Diagnosis not present

## 2024-07-22 NOTE — Progress Notes (Signed)
 PATIENT CHECK-IN and HEALTH RISK ASSESSMENT QUESTIONNAIRE:  -completed by phone/video for upcoming Medicare Preventive Visit   Pre-Visit Check-in: 1)Vitals (height, wt, BP, etc) - record in vitals section for visit on day of visit Request home vitals (wt, BP, etc.) and enter into vitals, THEN update Vital Signs SmartPhrase below at the top of the HPI. See below.  2)Review and Update Medications, Allergies PMH, Surgeries, Social history in Epic 3)Hospitalizations in the last year with date/reason?  Earlier this year  4)Review and Update Care Team (patient's specialists) in Epic 5) Complete PHQ9 in Epic  6) Complete Fall Screening in Epic 7)Review all Health Maintenance Due and order if not done.  Medicare Wellness Patient Questionnaire:  Answer theses question about your habits: How often do you have a drink containing alcohol?n How many drinks containing alcohol do you have on a typical day when you are drinking?na How often do you have six or more drinks on one occasion?na Have you ever smoked?yes How many packs a day do/did you smoke? ppd Do you use smokeless tobacco? 50+ years Do you use an illicit drugs?n On average, how many days per week do you engage in moderate to strenuous exercise (like a brisk walk)?yard work, treadmill, volunteers 3-5 days, very active, walking On average, how many minutes do you engage in exercise at this level? 15 minutes or more Typical breakfast: white rice, chicken or pork, Asian fruit Typical lunch: meat, rice, veggies, fruits Typical dinner:meat, rice, veggies, fruits Typical snacks: none  Beverages:  coffee, orange juice, not much water, electrolyte drink   Answer theses question about your everyday activities: Can you perform most household chores?y Are you deaf or have significant trouble hearing?n Do you feel that you have a problem with memory?n Do you feel safe at home?y Last dentist visit? Dentures, probably hasn't been to the dentist  in a few years.  8. Do you have any difficulty performing your everyday activities?n Are you having any difficulty walking, taking medications on your own, and or difficulty managing daily home needs?n Do you have difficulty walking or climbing stairs?n Do you have difficulty dressing or bathing?n Do you have difficulty doing errands alone such as visiting a doctor's office or shopping?n Do you currently have any difficulty preparing food and eating?n Do you currently have any difficulty using the toilet?n Do you have any difficulty managing your finances?n Do you have any difficulties with housekeeping of managing your housekeeping?n   Do you have Advanced Directives in place (Living Will, Healthcare Power or Attorney)? Yes, [name] - son   Last eye Exam and location? Goes to eye doctor on regular basis   Do you currently use prescribed or non-prescribed narcotic or opioid pain medications?n  Do you have a history or close family history of breast, ovarian, tubal or peritoneal cancer or a family member with BRCA (breast cancer susceptibility 1 and 2) gene mutations? See pmh/fh     ----------------------------------------------------------------------------------------------------------------------------------------------------------------------------------------------------------------------  Because this visit was a virtual/telehealth visit, some criteria may be missing or patient reported. Any vitals not documented were not able to be obtained and vitals that have been documented are patient reported.    MEDICARE ANNUAL PREVENTIVE VISIT WITH PROVIDER: (Welcome to Medicare, initial annual wellness or annual wellness exam)  Virtual Visit via Video Note  I connected with [name] on 07/22/24 by a video enabled telemedicine application and verified that I am speaking with the correct person using two identifiers.  Location patient: home Location provider:work  or home office Persons participating in the virtual visit: patient, provider  Concerns and/or follow up today: no concerns  Son says cuff is not working as it is off for him too and he will check on a different cuff and call the office. Pt feels fine. No concerns. Maintaining weight. Eating well.    See HM section in Epic for other details of completed HM.    ROS: negative for report of fevers, unintentional weight loss, vision changes, vision loss, hearing loss or change, chest pain, sob, hemoptysis, melena, hematochezia, hematuria, falls, bleeding or bruising, thoughts of suicide or self harm, memory loss  Patient-completed extensive health risk assessment - reviewed and discussed with the patient: See Health Risk Assessment completed with patient prior to the visit either above or in recent phone note. This was reviewed in detailed with the patient today and appropriate recommendations, orders and referrals were placed as needed per Summary below and patient instructions.   Review of Medical History: -PMH, PSH, Family History and current specialty and care providers reviewed and updated and listed below   Patient Care Team: [name], Tara GRADE, [name] as PCP - General (Internal Medicine) Tara Wilbert SAUNDERS, [name] as PCP - Cardiology (Cardiology) Tara Darice HERO, [name] as Consulting Physician (Neurology) [name], [facility] (Inactive) as Pharmacist (Pharmacist)   Past Medical History:  Diagnosis Date   At risk for medication noncompliance    CAD (coronary artery disease)    a. borderline disease by CT 08/2018.   Carotid artery disease (HCC)    1-39% bilateral carotid stenosis by dopplers 12/2022   Diabetes mellitus, type 2 (HCC)    History of cervical cancer    Hyperlipidemia    Hypertension    Mitral regurgitation    severe MR by echo 10/2020 // TEE 1/22: EF 60-65, normal RVSF, mild LAE, mod MR.  Moderate to severe MR by echo 11/2021   Noncompliance with medication treatment due  to underuse of medication    Raynaud's phenomenon    TN (trigeminal neuralgia)     Past Surgical History:  Procedure Laterality Date   BREAST BIOPSY     BREAST ENHANCEMENT SURGERY     CHOLECYSTECTOMY N/A 02/14/2014   Procedure: LAPAROSCOPIC CHOLECYSTECTOMY;  Surgeon: Lynda Leos, [name];  Location: [facility] OR;  Service: General;  Laterality: N/A;   ERCP N/A 03/08/2015   Procedure: ENDOSCOPIC RETROGRADE CHOLANGIOPANCREATOGRAPHY (ERCP);  Surgeon: Gwendlyn ONEIDA Buddy, [name];  Location: [facility];  Service: Endoscopy;  Laterality: N/A;   MASS EXCISION Right 12/25/2016   Procedure: EXCISION RIGHT SCALP MASS SEBACEOUS CYST;  Surgeon: Vicenta Poli, [name];  Location: [facility] OR;  Service: General;  Laterality: Right;   TEE WITHOUT CARDIOVERSION N/A 12/12/2020   Procedure: TRANSESOPHAGEAL ECHOCARDIOGRAM (TEE);  Surgeon: Tara Wilbert SAUNDERS, [name];  Location[facility] END[facility]COPY;  Service: Cardiovascular;  Laterality: N/A;   TOTAL ABDOMINAL HYSTERECTOMY  1963   for cervical cancer in situ    Social History   Socioeconomic History   Marital status: Widowed    Spouse name: Tara   Number of children: 1   Years of education: Not on file   Highest education level: Not on file  Occupational History   Occupation: Unemployed    Employer: NOT EMPLOYED  Tobacco Use   Smoking status: Every Day    Current packs/day: 1.00    Average packs/day: 1 pack/day for 60.0 years (60.0 ttl pk-yrs)    Types: Cigarettes   Smokeless tobacco: Never  Vaping Use   Vaping status:  Never Used  Substance and Sexual Activity   Alcohol use: No   Drug use: No   Sexual activity: Not on file  Other Topics Concern   Not on file  Social History Narrative   Married.  Lives in [place] with her husband.  No FH of heart disease.   1 adult son   + smoker   No EtOH   Right handed    Social Drivers of Health   Financial Resource Strain: Low Risk  (04/07/2023)   Overall Financial Resource Strain (CARDIA)    Difficulty of Paying Living Expenses:  Not hard at all  Food Insecurity: No Food Insecurity (07/22/2024)   Hunger Vital Sign    Worried About Running Out of Food in the Last Year: Never true    Ran Out of Food in the Last Year: Never true  Transportation Needs: No Transportation Needs (07/22/2024)   PRAPARE - Administrator, Civil Service (Medical): No    Lack of Transportation (Non-Medical): No  Physical Activity: Insufficiently Active (04/07/2023)   Exercise Vital Sign    Days of Exercise per Week: 3 days    Minutes of Exercise per Session: 30 min  Stress: No Stress Concern Present (04/07/2023)   [employer] of Occupational Health - Occupational Stress Questionnaire    Feeling of Stress : Not at all  Social Connections: Moderately Integrated (07/22/2024)   Social Connection and Isolation Panel    Frequency of Communication with Friends and Family: Twice a week    Frequency of Social Gatherings with Friends and Family: More than three times a week    Attends Religious Services: 1 to 4 times per year    Active Member of [employer] or Organizations: No    Attends Banker Meetings: 1 to 4 times per year    Marital Status: Widowed  Intimate Partner Violence: Not At Risk (05/13/2023)   Humiliation, Afraid, Rape, and Kick questionnaire    Fear of Current or Ex-Partner: No    Emotionally Abused: No    Physically Abused: No    Sexually Abused: No    Family History  Problem Relation Age of Onset   CAD Neg Hx    Diabetes Mellitus II Neg Hx     Current Outpatient Medications on File Prior to Visit  Medication Sig Dispense Refill   ASPIRIN  LOW DOSE 81 MG tablet TAKE ONE TABLET BY MOUTH DAILY AT BEDTIME. 90 tablet 1   atorvastatin  (LIPITOR) 80 MG tablet Take 1 tablet (80 mg total) by mouth at bedtime. 15 tablet 0   cholecalciferol (VITAMIN D3) 25 MCG (1000 UNIT) tablet Take 1,000 Units by mouth daily.     donepezil  (ARICEPT ) 10 MG tablet Take 1 tablet daily 90 tablet 3   mirtazapine  (REMERON ) 30 MG  tablet TAKE 1 TABLET (30 MG TOTAL) BY MOUTH AT BEDTIME. 90 tablet 1   Multiple Vitamin (MULTIVITAMIN) tablet Take 1 tablet by mouth daily.     nitroGLYCERIN  (NITROSTAT ) 0.4 MG SL tablet PLACE 1 TABLET (0.4 MG TOTAL) UNDER THE TONGUE EVERY 5 (FIVE) MINUTES AS NEEDED FOR CHEST PAIN. 50 tablet 3   No current facility-administered medications on file prior to visit.    Allergies  Allergen Reactions   Morphine  And Codeine Itching    Short lived, mild itching.       Physical Exam Vitals requested from patient and listed below if patient had equipment and was able to obtain at home for this virtual visit: Vitals:  07/22/24 1246 07/22/24 1259  BP: (!) 179/71 (!) 180/66  Pulse: 18   Son says cuff is not working as it is off for him too and he will check on a different cuff and call the office. Pt feels fine. No concerns.  Estimated body mass index is 18.83 kg/m as calculated from the following:   Height as of this encounter: 4' 7 (1.397 m).   Weight as of this encounter: 81 lb (36.7 kg).  EKG (optional): deferred due to virtual visit  GENERAL: alert, oriented, no acute distress detected, full vision exam deferred due to pandemic and/or virtual encounter   HEENT: atraumatic, conjunttiva clear, no obvious abnormalities on inspection of external nose and ears  NECK: normal movements of the head and neck  LUNGS: on inspection no signs of respiratory distress, breathing rate appears normal, no obvious gross SOB, gasping or wheezing  CV: no obvious cyanosis  MS: moves all visible extremities without noticeable abnormality  PSYCH/NEURO: pleasant and cooperative, no obvious depression or anxiety, speech and thought processing grossly intact, Cognitive function grossly intact  Flowsheet Row Office Visit from 04/07/2023 in [facility]  PHQ-9 Total Score 0        04/07/2023    9:26 AM 12/03/2021    1:56 PM 06/28/2021    1:56 PM 04/25/2020    7:58 AM  10/21/2018    7:39 AM  Depression screen PHQ 2/9  Decreased Interest 0 0 0 0 0  Down, Depressed, Hopeless 0 0 0 1 0  PHQ - 2 Score 0 0 0 1 0  Altered sleeping 0  0 0   Tired, decreased energy 0  1 2   Change in appetite 0  2 2   Feeling bad or failure about yourself  0  0 0   Trouble concentrating 0  0 0   Moving slowly or fidgety/restless 0  0 0   Suicidal thoughts 0  0 0   PHQ-9 Score 0  3 5   Difficult doing work/chores Not difficult at all  Not difficult at all Not difficult at all        06/28/2021    1:56 PM 09/18/2021   10:21 AM 12/03/2021    1:56 PM 12/30/2022   12:58 PM 04/07/2023    9:26 AM  Fall Risk  Falls in the past year? 0 0 0 0 0  Was there an injury with Fall? 0 0 0 0 0  Fall Risk Category Calculator 0 0 0 0 0  Fall Risk Category (Retired) Low  Low  Low     (RETIRED) Patient Fall Risk Level  Moderate fall risk  Low fall risk     Patient at Risk for Falls Due to   No Fall Risks    Fall risk Follow up   Falls evaluation completed  Falls evaluation completed Falls evaluation completed     Data saved with a previous flowsheet row definition     SUMMARY AND PLAN:  Encounter for Medicare annual wellness exam  Discussed applicable health maintenance/preventive health measures and advised and referred or ordered per patient preferences: -BP elevated, however pt and caregiver feel cuff is not working and plan to recheck on another cuff within the next 24 hours and call office. Discussed BP goals, risks of elevated BP and various tx options should it remain high and advised to notify PCP if elevated on recheck. They prefer to avoid medications as much as possible. Discussed dietary  recs for lowering BP.  -declined dexa -reviewed other measures and vaccines due, they are going to do labs/foot exam at in office visit (they agree to schedule). They plan to check on vaccines record to see if had the ones listed on HM as due.  Health Maintenance  Topic Date Due    DTaP/Tdap/Td (1 - Tdap) Never done   Zoster Vaccines- Shingrix (1 of 2) Never done   OPHTHALMOLOGY EXAM  07/17/2011   FOOT EXAM  10/22/2019   COVID-19 Vaccine (4 - 2024-25 season) 07/27/2023   HEMOGLOBIN A1C  10/08/2023   INFLUENZA VACCINE  06/25/2024   Medicare Annual Wellness (AWV)  07/22/2025   Pneumococcal Vaccine: 50+ Years  Completed   HPV VACCINES  Aged Out   Meningococcal B Vaccine  Aged Out   DEXA SCAN  Discontinued      Education and counseling on the following was provided based on the above review of health and a plan/checklist for the patient, along with additional information discussed, was provided for the patient in the patient instructions :     -Advised and counseled on a healthy lifestyle -Reviewed patient's current diet. Advised and counseled on a whole foods based healthy diet. Discussed low sodium, low sugar, switching to whole grains and increased leafy green veggies for lowering BP. A summary of a healthy diet was provided in the Patient Instructions.  -reviewed patient's current physical activity level and discussed exercise guidelines for adults. Provided community resources and ideas for safe exercise at home to assist in meeting exercise guideline recommendations in a safe and healthy way.  -Advise yearly dental visits at minimum and regular eye exams -Advised and counseled on tobacco use, risks of smoking and offered counseling/help - she currently is not interested in quitting.   Follow up: see patient instructions     Patient Instructions  I really enjoyed getting to talk with you today! I am available on Tuesdays and Thursdays for virtual visits if you have any questions or concerns, or if I can be of any further assistance.   CHECKLIST FROM ANNUAL WELLNESS VISIT:  -Follow up (please call to schedule if not scheduled after visit):   - recheck blood pressure. Goal is 120/70. If running high please notify Dr. Theophilus and schedule in office visit  ASAP. Otherwise, please schedule follow up in the next 1-2 months.    -yearly for annual wellness visit with primary care office  Here is a list of your preventive care/health maintenance measures and the plan for each if any are due:  PLAN For any measures below that may be due:    1. Can get labs and foot exam at the office   2. Please have eye doctor send us  report   3. Can get vaccines at the pharmacy  Health Maintenance  Topic Date Due   DTaP/Tdap/Td (1 - Tdap) Never done   Zoster Vaccines- Shingrix (1 of 2) Never done   OPHTHALMOLOGY EXAM  07/17/2011   FOOT EXAM  10/22/2019   COVID-19 Vaccine (4 - 2024-25 season) 07/27/2023   HEMOGLOBIN A1C  10/08/2023   INFLUENZA VACCINE  06/25/2024   Medicare Annual Wellness (AWV)  07/22/2025   Pneumococcal Vaccine: 50+ Years  Completed   HPV VACCINES  Aged Out   Meningococcal B Vaccine  Aged Out   DEXA SCAN  Discontinued    -See a dentist at least yearly  -Get your eyes checked and then per your eye specialist's recommendations  -Other issues addressed today:   -  I have included below further information regarding a healthy whole foods based diet, physical activity guidelines for adults, stress management and opportunities for social connections. I hope you find this information useful.   -----------------------------------------------------------------------------------------------------------------------------------------------------------------------------------------------------------------------------------------------------------    NUTRITION: -eat real food: lots of colorful vegetables (half the plate) and fruits -5-7 servings of vegetables and fruits per day (fresh or steamed is best), exp. 2 servings of vegetables with lunch and dinner and 2 servings of fruit per day. Berries and greens such as kale and collards are great choices.  -consume on a regular basis:  fresh fruits, fresh veggies, fish, nuts, seeds, healthy oils  (such as olive oil, avocado oil), whole grains (make sure for bread/pasta/crackers/etc., that the first ingredient on label contains the word whole), legumes. -can eat small amounts of dairy and lean meat (no larger than the palm of your hand), but avoid processed meats such as ham, bacon, lunch meat, etc. -drink water -try to avoid fast food and pre-packaged foods, processed meat, ultra processed foods/beverages (donuts, candy, etc.) -most experts advise limiting sodium to < 2300mg  per day, should limit further is any chronic conditions such as high blood pressure, heart disease, diabetes, etc. The American Heart Association advised that < 1500mg  is is ideal -try to avoid foods/beverages that contain any ingredients with names you do not recognize  -try to avoid foods/beverages  with added sugar or sweeteners/sweets  -try to avoid sweet drinks (including diet drinks): soda, juice, Gatorade, sweet tea, power drinks, diet drinks -try to avoid white rice, white bread, pasta (unless whole grain)  EXERCISE GUIDELINES FOR ADULTS: -if you wish to increase your physical activity, do so gradually and with the approval of your doctor -STOP and seek medical care immediately if you have any chest pain, chest discomfort or trouble breathing when starting or increasing exercise  -move and stretch your body, legs, feet and arms when sitting for long periods -Physical activity guidelines for optimal health in adults: -get at least 150 minutes per week of moderate exercise (can talk, but not sing); this is about 20-30 minutes of sustained activity 5-7 days per week or two 10-15 minute episodes of sustained activity 5-7 days per week -do some muscle building/resistance training/strength training at least 2 days per week  -balance exercises 3+ days per week:   Stand somewhere where you have something sturdy to hold onto if you lose balance    1) lift up on toes, then back down, start with 5x per day and work up  to 20x   2) stand and lift one leg straight out to the side so that foot is a few inches of the floor, start with 5x each side and work up to 20x each side   3) stand on one foot, start with 5 seconds each side and work up to 20 seconds on each side  If you need ideas or help with getting more active:  -Silver sneakers https://tools.silversneakers.com  -Walk with a Doc: http://www.duncan-williams.com/  -try to include resistance (weight lifting/strength building) and balance exercises twice per week: or the following link for ideas: http://castillo-powell.com/  BuyDucts.dk  STRESS MANAGEMENT: -can try meditating, or just sitting quietly with deep breathing while intentionally relaxing all parts of your body for 5 minutes daily -if you need further help with stress, anxiety or depression please follow up with your primary doctor or contact the wonderful folks at [facility]: [phone]  SOCIAL CONNECTIONS: -options in [place] if you wish to engage in more social and  exercise related activities:  -Silver sneakers https://tools.silversneakers.com  -Walk with a Doc: http://www.duncan-williams.com/  -Check out the [facility] Active Adults 50+ section on the [place] of [employer] (hiking clubs, book clubs, cards and games, chess, exercise classes, aquatic classes and much more) - see the website for details: https://www.[place].gov/departments/parks-recreation/active-adults50  -YouTube has lots of exercise videos for different ages and abilities as well  -[facility] (a variety of indoor and outdoor inperson activities for adults). [phone]. [address]  -Virtual Online Classes (a variety of topics): see seniorplanet.org or call [phone]  -consider volunteering at a school, hospice center, church, senior center or  elsewhere            Chiquita JONELLE Cramp, DO

## 2024-07-22 NOTE — Patient Instructions (Addendum)
 I really enjoyed getting to talk with you today! I am available on Tuesdays and Thursdays for virtual visits if you have any questions or concerns, or if I can be of any further assistance.   CHECKLIST FROM ANNUAL WELLNESS VISIT:  -Follow up (please call to schedule if not scheduled after visit):   - recheck blood pressure. Goal is 120/70. If running high please notify Dr. Theophilus and schedule in office visit ASAP. Otherwise, please schedule follow up in the next 1-2 months.    -yearly for annual wellness visit with primary care office  Here is a list of your preventive care/health maintenance measures and the plan for each if any are due:  PLAN For any measures below that may be due:    1. Can get labs and foot exam at the office   2. Please have eye doctor send us  report   3. Can get vaccines at the pharmacy  Health Maintenance  Topic Date Due   DTaP/Tdap/Td (1 - Tdap) Never done   Zoster Vaccines- Shingrix (1 of 2) Never done   OPHTHALMOLOGY EXAM  07/17/2011   FOOT EXAM  10/22/2019   COVID-19 Vaccine (4 - 2024-25 season) 07/27/2023   HEMOGLOBIN A1C  10/08/2023   INFLUENZA VACCINE  06/25/2024   Medicare Annual Wellness (AWV)  07/22/2025   Pneumococcal Vaccine: 50+ Years  Completed   HPV VACCINES  Aged Out   Meningococcal B Vaccine  Aged Out   DEXA SCAN  Discontinued    -See a dentist at least yearly  -Get your eyes checked and then per your eye specialist's recommendations  -Other issues addressed today:   -I have included below further information regarding a healthy whole foods based diet, physical activity guidelines for adults, stress management and opportunities for social connections. I hope you find this information useful.    -----------------------------------------------------------------------------------------------------------------------------------------------------------------------------------------------------------------------------------------------------------    NUTRITION: -eat real food: lots of colorful vegetables (half the plate) and fruits -5-7 servings of vegetables and fruits per day (fresh or steamed is best), exp. 2 servings of vegetables with lunch and dinner and 2 servings of fruit per day. Berries and greens such as kale and collards are great choices.  -consume on a regular basis:  fresh fruits, fresh veggies, fish, nuts, seeds, healthy oils (such as olive oil, avocado oil), whole grains (make sure for bread/pasta/crackers/etc., that the first ingredient on label contains the word whole), legumes. -can eat small amounts of dairy and lean meat (no larger than the palm of your hand), but avoid processed meats such as ham, bacon, lunch meat, etc. -drink water -try to avoid fast food and pre-packaged foods, processed meat, ultra processed foods/beverages (donuts, candy, etc.) -most experts advise limiting sodium to < 2300mg  per day, should limit further is any chronic conditions such as high blood pressure, heart disease, diabetes, etc. The American Heart Association advised that < 1500mg  is is ideal -try to avoid foods/beverages that contain any ingredients with names you do not recognize  -try to avoid foods/beverages  with added sugar or sweeteners/sweets  -try to avoid sweet drinks (including diet drinks): soda, juice, Gatorade, sweet tea, power drinks, diet drinks -try to avoid white rice, white bread, pasta (unless whole grain)  EXERCISE GUIDELINES FOR ADULTS: -if you wish to increase your physical activity, do so gradually and with the approval of your doctor -STOP and seek medical care immediately if you have any chest pain, chest discomfort or trouble breathing when starting or  increasing exercise  -move and  stretch your body, legs, feet and arms when sitting for long periods -Physical activity guidelines for optimal health in adults: -get at least 150 minutes per week of moderate exercise (can talk, but not sing); this is about 20-30 minutes of sustained activity 5-7 days per week or two 10-15 minute episodes of sustained activity 5-7 days per week -do some muscle building/resistance training/strength training at least 2 days per week  -balance exercises 3+ days per week:   Stand somewhere where you have something sturdy to hold onto if you lose balance    1) lift up on toes, then back down, start with 5x per day and work up to 20x   2) stand and lift one leg straight out to the side so that foot is a few inches of the floor, start with 5x each side and work up to 20x each side   3) stand on one foot, start with 5 seconds each side and work up to 20 seconds on each side  If you need ideas or help with getting more active:  -Silver sneakers https://tools.silversneakers.com  -Walk with a Doc: http://www.duncan-williams.com/  -try to include resistance (weight lifting/strength building) and balance exercises twice per week: or the following link for ideas: http://castillo-powell.com/  BuyDucts.dk  STRESS MANAGEMENT: -can try meditating, or just sitting quietly with deep breathing while intentionally relaxing all parts of your body for 5 minutes daily -if you need further help with stress, anxiety or depression please follow up with your primary doctor or contact the wonderful folks at [facility]: [phone]  SOCIAL CONNECTIONS: -options in [place] if you wish to engage in more social and exercise related activities:  -Silver sneakers https://tools.silversneakers.com  -Walk with a Doc: http://www.duncan-williams.com/  -Check out the [facility] Active Adults 50+  section on the [place] of [employer] (hiking clubs, book clubs, cards and games, chess, exercise classes, aquatic classes and much more) - see the website for details: https://www.[place].gov/departments/parks-recreation/active-adults50  -YouTube has lots of exercise videos for different ages and abilities as well  -[facility] (a variety of indoor and outdoor inperson activities for adults). [phone]. [address]  -Virtual Online Classes (a variety of topics): see seniorplanet.org or call [phone]  -consider volunteering at a school, hospice center, church, senior center or elsewhere

## 2024-07-30 ENCOUNTER — Ambulatory Visit: Payer: PPO | Admitting: Neurology

## 2024-08-13 ENCOUNTER — Other Ambulatory Visit: Payer: Self-pay | Admitting: Cardiology

## 2024-08-13 ENCOUNTER — Other Ambulatory Visit: Payer: Self-pay | Admitting: Internal Medicine

## 2024-08-13 DIAGNOSIS — E43 Unspecified severe protein-calorie malnutrition: Secondary | ICD-10-CM

## 2024-09-14 ENCOUNTER — Other Ambulatory Visit: Payer: Self-pay | Admitting: Cardiology

## 2024-09-16 ENCOUNTER — Telehealth: Payer: Self-pay | Admitting: Cardiology

## 2024-09-16 MED ORDER — ATORVASTATIN CALCIUM 80 MG PO TABS: 80.0000 mg | ORAL_TABLET | Freq: Every day | ORAL | 0 refills | Status: DC

## 2024-09-16 NOTE — Telephone Encounter (Signed)
*  STAT* If patient is at the pharmacy, call can be transferred to refill team.   1. Which medications need to be refilled? (please list name of each medication and dose if known) atorvastatin  (LIPITOR) 80 MG tablet    2. Would you like to learn more about the convenience, safety, & potential cost savings by using the [facility]?     3. Are you open to using the [facility] (Type [facility]. ).   4. Which pharmacy/location (including street and city if local pharmacy) is medication to be sent to?  [facility] - [place], [place] - [address]    5. Do they need a 30 day or 90 day supply? 90 day

## 2024-09-16 NOTE — Telephone Encounter (Signed)
 Pt's medication was sent to pt's pharmacy as requested. Confirmation received.

## 2024-10-08 ENCOUNTER — Encounter: Payer: Self-pay | Admitting: Neurology

## 2024-10-13 ENCOUNTER — Other Ambulatory Visit: Payer: Self-pay | Admitting: Internal Medicine

## 2024-10-13 ENCOUNTER — Telehealth: Payer: Self-pay | Admitting: Cardiology

## 2024-10-13 NOTE — Telephone Encounter (Signed)
 DPR ok to s/w the pt's son. We discussed some appt times at [facility] and [facility] location. He needs to check about the transportation for his mom. He asked if I could call back in about 30 minutes.

## 2024-10-13 NOTE — Telephone Encounter (Signed)
 Unable to leave a voicemail due to inbox being full. Will try again later.

## 2024-10-13 NOTE — Telephone Encounter (Signed)
 Spoke to the patient's son (on [place]), he informed me that he would like for her to be soon as soon as possible and he requested. He wasn't too thrilled we didn't have anything at the [facility] office, but was hopeful for the [facility] office. She has been schedule for pre-op clearance on 10/18/24 with Rosaline Bane, NP.

## 2024-10-13 NOTE — Telephone Encounter (Signed)
   Name: [name]  DOB: 1934-01-02  MRN: [id number]  Primary Cardiologist: Wilbert Bihari, MD  Chart reviewed as part of pre-operative protocol coverage. Because of Christinea Brizuela past medical history and time since last visit, she will require a follow-up in-office visit in order to better assess preoperative cardiovascular risk Last seen by Dr. Bihari on 05/01/2023. Has appt on 12/02/2024, but procedure is on 10/25/2024  Pre-op covering staff: - Please schedule appointment and call patient to inform them. If patient already had an upcoming appointment within acceptable timeframe, please add pre-op clearance to the appointment notes so provider is aware. - Please contact requesting surgeon's office via preferred method (i.e, phone, fax) to inform them of need for appointment prior to surgery.   Lamarr Satterfield, NP  10/13/2024, 11:33 AM

## 2024-10-13 NOTE — Telephone Encounter (Signed)
Left message to call back to schedule an appt in office for pre op clearance.

## 2024-10-13 NOTE — Telephone Encounter (Signed)
 Patient returned Pre-op call.

## 2024-10-13 NOTE — Telephone Encounter (Signed)
     Pre-operative Risk Assessment    Patient Name: [name]  DOB: [birth year] MRN: [id number]   Date of last office visit: 05/01/23 Date of next office visit: 12/02/2024  Request for Surgical Clearance    Procedure:  Dental Extraction - Amount of Teeth to be Pulled:  3  Date of Surgery:  Clearance 10/25/24                                Surgeon:  Dr. Marolyn Boers  Surgeon's Group or Practice Name:  [facility] Phone number:  [phone] Fax number:  [phone]   Type of Clearance Requested:   - Medical  - Pharmacy:  Hold defer to cards      Type of Anesthesia:  Local    Additional requests/questions:    Signed, Deeanna GORMAN Frees   10/13/2024, 11:04 AM

## 2024-10-17 NOTE — Progress Notes (Unsigned)
 Cardiology Office Note   Date:  10/18/2024  ID:  [name], DOB 09/03/1934, MRN [id number] PCP: Theophilus Andrews, Tully GRADE, MD  [facility] Providers Cardiologist:  Wilbert Bihari, MD     [facility] Coronary artery disease Myoview  03/2018 with anteroseptal and lateral ischemia CTA 08/2018: CAC score 662, moderate CAD in LAD and LCx with borderline significance in LCx by FFR>> medical therapy unless significant symptoms Mitral regurgitation Moderate by TEE 11/2020 Carotid artery disease Diabetes mellitus Hypertension Hyperlipidemia Raynaud's disease History of elevated LFTs with dilated common bile duct Tobacco abuse Trigeminal neuralgia Medication non-adherence Aortic atherosclerosis Dementia  She established with cardiology during admission in 2019 for chest pain and elevated LFTs.  Her anatomy did not allow for ERCP and it was recommended that she had that performed at a tertiary care center, however she preferred conservative management.  Cardiac history as outlined above.  TEE 11/2020 with LVEF 60 to 65%, normal RV, mildly dilated LA, moderate MR, grade 2 aortic atherosclerosis.  She has maintained consistent follow-up.  Last clinic visit was 05/01/2023 with Dr. Bihari.  She was taking aspirin  81 mg daily, atorvastatin  80 mg daily and unfortunately continued to smoke.  Interestingly, she has not demonstrated a significant murmur despite at least moderate MR by TEE.  It was felt that her mitral valve would not be amendable to transcatheter edge-to-edge repair given significant calcification.   History of Present Illness Discussed the use of AI scribe software for clinical note transcription with the patient, who gave verbal consent to proceed.  History of Present Illness [name] is a very pleasant 89 year old female who presents for cardiovascular evaluation prior to a dental procedure. She is accompanied by a friend, [name]. She has mitral valve  prolapse and is currently asymptomatic with no recent episodes of chest pain, dyspnea, or palpitations. She maintains an active lifestyle, performing light housekeeping, walking her dog, and driving without difficulty. She lives alone but has a supportive family network, including the friend who is with her today.  She denies blood being concerns, orthopnea, PND, edema, lightheadedness, presyncope, syncope.  ROS: See HPI  Studies Reviewed EKG Interpretation Date/Time:  Monday October 18 2024 09:01:55 EST Ventricular Rate:  82 PR Interval:  110 QRS Duration:  78 QT Interval:  388 QTC Calculation: 453 R Axis:   -56  Text Interpretation: Sinus rhythm with short PR with Premature atrial complexes Left anterior fascicular block When compared with ECG of 12-May-2023 21:32, No acute changes Confirmed by Percy Browning [phone]) on 10/18/2024 9:28:20 AM     No results found for: LIPOA  Risk Assessment/Calculations           Physical Exam VS:  BP 132/82 (BP Location: Left Arm, Patient Position: Sitting, Cuff Size: Normal)   Pulse 82   Ht 4' 7 (1.397 m)   Wt 80 lb 12.8 oz (36.7 kg)   BMI 18.78 kg/m    Wt Readings from Last 3 Encounters:  10/18/24 80 lb 12.8 oz (36.7 kg)  07/22/24 81 lb (36.7 kg)  05/13/23 81 lb 9.1 oz (37 kg)    GEN: Well nourished, well developed in no acute distress NECK: No JVD; No carotid bruits CARDIAC: RRR, soft murmur on exam, no rubs, gallops RESPIRATORY:  Clear to auscultation without rales, wheezing or rhonchi  ABDOMEN: Soft, non-tender, non-distended EXTREMITIES:  No edema; No deformity    Assessment & Plan Preoperative cardiac evaluation According to the Revised Cardiac Risk Index (RCRI), her Perioperative Risk of Major  Cardiac Event is (%): 0.4. Her Functional Capacity in METs is: 6.05 according to the Duke Activity Status Index (DASI). The patient is doing well from a cardiac perspective. Therefore, based on ACC/AHA guidelines, the patient would  be at acceptable risk for the planned procedure without further cardiovascular testing. Per office protocol, she may hold aspirin  for 5-7 days prior to procedure and should resume as soon as hemodynamically stable postoperatively.  CAD Moderate CAD in proximal and mid LAD and proximal and mid LCx with CT FFR analysis showing stenosis in midportion of nondominant LCx, recommendation for medical therapy since no significant symptoms. She denies chest pain, dyspnea, or other symptoms concerning for angina.  No indication for further ischemic evaluation at this time - Continue aspirin , atorvastatin  - Continue healthy diet and regular physical activity  Mitral valve prolapse  Moderate LVH Diastolic dysfunction  Echo 12/2022 with LVEF 55 to 60%, no RWMA, moderate LVH, grade 2 diastolic function, mildly elevated pulmonary artery systolic pressure.  Degenerative mitral valve with calcification, severe MR. Felt not to be a candidate for invasive procedures and patient wishes to manage conservatively.  Patient did not feel repeat echo was indicated given no new symptoms. No increased shortness of breath, chest discomfort, palpitations, or other symptoms concerning for worsening valve function.  Soft murmur on exam.  No evidence of volume overload on exam.  We discussed red flag symptoms to report. - Continue to monitor clinically for now         Dispo: 1 year with Dr. Shlomo or APP  Signed, Rosaline Bane, NP-C

## 2024-10-18 ENCOUNTER — Ambulatory Visit (INDEPENDENT_AMBULATORY_CARE_PROVIDER_SITE_OTHER): Admitting: Nurse Practitioner

## 2024-10-18 ENCOUNTER — Encounter (HOSPITAL_BASED_OUTPATIENT_CLINIC_OR_DEPARTMENT_OTHER): Payer: Self-pay | Admitting: Nurse Practitioner

## 2024-10-18 VITALS — BP 132/82 | HR 82 | Ht <= 58 in | Wt 80.8 lb

## 2024-10-18 DIAGNOSIS — I340 Nonrheumatic mitral (valve) insufficiency: Secondary | ICD-10-CM | POA: Diagnosis not present

## 2024-10-18 DIAGNOSIS — I10 Essential (primary) hypertension: Secondary | ICD-10-CM

## 2024-10-18 DIAGNOSIS — I517 Cardiomegaly: Secondary | ICD-10-CM | POA: Diagnosis not present

## 2024-10-18 DIAGNOSIS — E785 Hyperlipidemia, unspecified: Secondary | ICD-10-CM | POA: Diagnosis not present

## 2024-10-18 DIAGNOSIS — I5189 Other ill-defined heart diseases: Secondary | ICD-10-CM | POA: Diagnosis not present

## 2024-10-18 DIAGNOSIS — I25119 Atherosclerotic heart disease of native coronary artery with unspecified angina pectoris: Secondary | ICD-10-CM | POA: Diagnosis not present

## 2024-10-18 LAB — BASIC METABOLIC PANEL WITH GFR
BUN/Creatinine Ratio: 13 (ref 12–28)
BUN: 17 mg/dL (ref 8–27)
CO2: 21 mmol/L (ref 20–29)
Calcium: 10.1 mg/dL (ref 8.7–10.3)
Chloride: 106 mmol/L (ref 96–106)
Creatinine, Ser: 1.32 mg/dL — ABNORMAL HIGH (ref 0.57–1.00)
Glucose: 90 mg/dL (ref 70–99)
Potassium: 5 mmol/L (ref 3.5–5.2)
Sodium: 143 mmol/L (ref 134–144)
eGFR: 39 mL/min/1.73 — ABNORMAL LOW (ref >59)

## 2024-10-18 LAB — CBC
Hematocrit: 40.6 % (ref 34.0–46.6)
Hemoglobin: 13.5 g/dL (ref 11.1–15.9)
MCH: 31.8 pg (ref 26.6–33.0)
MCHC: 33.3 g/dL (ref 31.5–35.7)
MCV: 96 fL (ref 79–97)
Platelets: 131 x10E3/uL — ABNORMAL LOW (ref 150–450)
RBC: 4.24 x10E6/uL (ref 3.77–5.28)
RDW: 12.5 % (ref 11.7–15.4)
WBC: 5.8 x10E3/uL (ref 3.4–10.8)

## 2024-10-18 NOTE — Patient Instructions (Signed)
 Medication Instructions:   Your physician recommends that you continue on your current medications as directed. Please refer to the Current Medication list given to you today.   *If you need a refill on your cardiac medications before your next appointment, please call your pharmacy*  Lab Work:  TODAY!!! CBC/BMET  If you have labs (blood work) drawn today and your tests are completely normal, you will receive your results only by: MyChart Message (if you have MyChart) OR A paper copy in the mail If you have any lab test that is abnormal or we need to change your treatment, we will call you to review the results.  Testing/Procedures:  None ordered.   Follow-Up: At [facility], you and your health needs are our priority.  As part of our continuing mission to provide you with exceptional heart care, our providers are all part of one team.  This team includes your primary Cardiologist (physician) and Advanced Practice Providers or APPs (Physician Assistants and Nurse Practitioners) who all work together to provide you with the care you need, when you need it.  Your next appointment:   1 year(s)  Provider:   Wilbert Bihari, MD    We recommend signing up for the patient portal called MyChart.  Sign up information is provided on this After Visit Summary.  MyChart is used to connect with patients for Virtual Visits (Telemedicine).  Patients are able to view lab/test results, encounter notes, upcoming appointments, etc.  Non-urgent messages can be sent to your provider as well.   To learn more about what you can do with MyChart, go to [email or web address].   Other Instructions  Your physician wants you to follow-up in: 1 year.  You will receive a reminder letter in the mail two months in advance. If you don't receive a letter, please call our office to schedule the follow-up appointment.

## 2024-10-19 ENCOUNTER — Ambulatory Visit (HOSPITAL_BASED_OUTPATIENT_CLINIC_OR_DEPARTMENT_OTHER): Payer: Self-pay | Admitting: Nurse Practitioner

## 2024-10-20 ENCOUNTER — Telehealth: Payer: Self-pay | Admitting: Cardiology

## 2024-10-20 NOTE — Telephone Encounter (Signed)
 [place]) is following-up on the status of patient's clearance as patient has appointment scheduled on 12/1.

## 2024-10-20 NOTE — Telephone Encounter (Signed)
 Will forward to preop AP to review if pt has been cleared. I have reviewed the notes from Rosaline Bane, NP which reflects the pt has been cleared and recommendations for ASA as well.    Will have preop APP review as well to be sure that I have missed something.

## 2024-11-17 ENCOUNTER — Other Ambulatory Visit: Payer: Self-pay | Admitting: Cardiology

## 2024-11-17 ENCOUNTER — Other Ambulatory Visit: Payer: Self-pay | Admitting: Neurology

## 2024-11-17 MED ORDER — ATORVASTATIN CALCIUM 80 MG PO TABS: 80.0000 mg | ORAL_TABLET | Freq: Every day | ORAL | 3 refills | Status: AC

## 2024-12-02 ENCOUNTER — Ambulatory Visit: Admitting: Cardiology

## 2025-02-22 ENCOUNTER — Ambulatory Visit: Admitting: Neurology
